# Patient Record
Sex: Male | Born: 1955
Health system: Southern US, Community
[De-identification: ages and names within clinical notes are randomized; demographics above are authoritative.]

## PROBLEM LIST (undated history)

## (undated) DIAGNOSIS — E119 Type 2 diabetes mellitus without complications: Secondary | ICD-10-CM

## (undated) DIAGNOSIS — F1010 Alcohol abuse, uncomplicated: Secondary | ICD-10-CM

## (undated) HISTORY — PX: APPENDECTOMY: SHX54

---

## 2018-02-18 ENCOUNTER — Emergency Department (HOSPITAL_COMMUNITY): Payer: Self-pay

## 2018-02-18 ENCOUNTER — Emergency Department (HOSPITAL_COMMUNITY)
Admission: EM | Admit: 2018-02-18 | Discharge: 2018-02-19 | Disposition: A | Discharge status: Home / Self Care | Payer: Self-pay | Attending: Emergency Medicine | Admitting: Emergency Medicine

## 2018-02-18 ENCOUNTER — Other Ambulatory Visit: Payer: Self-pay

## 2018-02-18 ENCOUNTER — Encounter (HOSPITAL_COMMUNITY): Payer: Self-pay | Admitting: Emergency Medicine

## 2018-02-18 DIAGNOSIS — R55 Syncope and collapse: Secondary | ICD-10-CM | POA: Insufficient documentation

## 2018-02-18 DIAGNOSIS — R739 Hyperglycemia, unspecified: Secondary | ICD-10-CM | POA: Insufficient documentation

## 2018-02-18 LAB — BASIC METABOLIC PANEL
Anion gap: 18 — ABNORMAL HIGH (ref 5–15)
BUN: 10 mg/dL (ref 8–23)
CHLORIDE: 99 mmol/L (ref 98–111)
CO2: 20 mmol/L — ABNORMAL LOW (ref 22–32)
CREATININE: 0.68 mg/dL (ref 0.61–1.24)
Calcium: 9.1 mg/dL (ref 8.9–10.3)
GFR calc non Af Amer: >60 mL/min (ref >60)
Glucose, Bld: 304 mg/dL — ABNORMAL HIGH (ref 70–99)
Potassium: 4 mmol/L (ref 3.5–5.1)
SODIUM: 137 mmol/L (ref 135–145)

## 2018-02-18 LAB — CBC
HCT: 41.4 % (ref 39.0–52.0)
Hemoglobin: 14.4 g/dL (ref 13.0–17.0)
MCH: 34.2 pg — ABNORMAL HIGH (ref 26.0–34.0)
MCHC: 34.8 g/dL (ref 30.0–36.0)
MCV: 98.3 fL (ref 78.0–100.0)
PLATELETS: 159 10*3/uL (ref 150–400)
RBC: 4.21 MIL/uL — AB (ref 4.22–5.81)
RDW: 12 % (ref 11.5–15.5)
WBC: 4.5 10*3/uL (ref 4.0–10.5)

## 2018-02-18 LAB — URINALYSIS, ROUTINE W REFLEX MICROSCOPIC
BILIRUBIN URINE: NEGATIVE
Bacteria, UA: NONE SEEN
Glucose, UA: >=500 mg/dL — AB
Hgb urine dipstick: NEGATIVE
Ketones, ur: 20 mg/dL — AB
LEUKOCYTES UA: NEGATIVE
NITRITE: NEGATIVE
PROTEIN: NEGATIVE mg/dL
SPECIFIC GRAVITY, URINE: 1.015 (ref 1.005–1.030)
pH: 5 (ref 5.0–8.0)

## 2018-02-18 LAB — CBG MONITORING, ED: Glucose-Capillary: 300 mg/dL — ABNORMAL HIGH (ref 70–99)

## 2018-02-18 LAB — I-STAT TROPONIN, ED: Troponin i, poc: 0.01 ng/mL (ref 0.00–0.08)

## 2018-02-18 MED ORDER — SODIUM CHLORIDE 0.9 % IV BOLUS
1000.0000 mL | Freq: Once | INTRAVENOUS | Status: AC
  Administered 2018-02-19: 1000 mL via INTRAVENOUS

## 2018-02-18 NOTE — ED Triage Notes (Signed)
Pt reports multiple syncopal episodes today, one witnessed by family member. ETOH on board. Pt fell and hit is face, abrasion noted to orbital area on L side

## 2018-02-18 NOTE — ED Notes (Signed)
Pt CBG was 300, notified Emilie(RN)

## 2018-02-18 NOTE — ED Notes (Signed)
Patient transported to CT 

## 2018-02-18 NOTE — ED Notes (Signed)
Pt concerned regarding fingernails falling off. MD and family at bedside. Has not seen a doctor in over 30 years.

## 2018-02-18 NOTE — ED Provider Notes (Signed)
[facility] EMERGENCY DEPARTMENT Provider Note   CSN: [id number] Arrival date & time: 02/18/18  2227     History   Chief Complaint Chief Complaint  Patient presents with  . Loss of Consciousness    HPI Frederick Moody is a 62 y.o. male.  Patient is a 62 year old male with no significant past medical history and tells me he has not seen a physician in nearly 40 years.  He presents today for evaluation of syncope.  He states he he has had several episodes this evening where he has "fallen out".  One time he fell and struck his head on the floor.  He does report a brief loss of consciousness and has abrasions to the forehead above his left eye.  He denies any palpitations, chest pain, headache, or visual disturbances.  He does admit to consuming one beer this evening and also took a Xanax that was given to him by his brother.  Patient does admit to experiencing numbness in his feet that has been ongoing for may months, and is now having the same sensation in the right hand.    The history is provided by the patient and a relative.  Loss of Consciousness   This is a new problem. Episode onset: this afternoon. The problem has been resolved. He lost consciousness for a period of 1 to 5 minutes. The problem is associated with normal activity. Pertinent negatives include chest pain, fever, malaise/fatigue, palpitations and visual change.    History reviewed. No pertinent past medical history.  There are no active problems to display for this patient.   History reviewed. No pertinent surgical history.      Home Medications    Prior to Admission medications   Not on File    Family History No family history on file.  Social History Social History   Tobacco Use  . Smoking status: Never Smoker  . Smokeless tobacco: Never Used  Substance Use Topics  . Alcohol use: Yes  . Drug use: Not Currently     Allergies   Patient has no known  allergies.   Review of Systems Review of Systems  Constitutional: Negative for fever and malaise/fatigue.  Cardiovascular: Positive for syncope. Negative for chest pain and palpitations.  All other systems reviewed and are negative.    Physical Exam Updated Vital Signs BP (!) 162/90 (BP Location: Right Arm)   Pulse 92   Temp 98 F (36.7 C) (Oral)   Resp (!) 23   Ht 5\' 9"  (1.753 m)   Wt 123.4 kg   SpO2 96%   BMI 40.17 kg/m   Physical Exam  Constitutional: He is oriented to person, place, and time. He appears well-developed and well-nourished. No distress.  HENT:  Head: Normocephalic.  Mouth/Throat: Oropharynx is clear and moist.  There are 3 areas of superficial abrasion to the forehead just above and lateral to the left eyebrow.  Eyes: Pupils are equal, round, and reactive to light. EOM are normal.  Neck: Normal range of motion. Neck supple.  There is no cervical spine tenderness.  There are no step-offs.  He has painless range of motion in all directions.  Cardiovascular: Normal rate and regular rhythm. Exam reveals no friction rub.  No murmur heard. Pulmonary/Chest: Effort normal and breath sounds normal. No respiratory distress. He has no wheezes. He has no rales.  Abdominal: Soft. Bowel sounds are normal. He exhibits no distension. There is no tenderness.  Musculoskeletal: Normal range of motion. He exhibits  no edema.  Neurological: He is alert and oriented to person, place, and time. No cranial nerve deficit. He exhibits normal muscle tone. Coordination normal.  Skin: Skin is warm and dry. He is not diaphoretic.  Nursing note and vitals reviewed.    ED Treatments / Results  Labs (all labs ordered are listed, but only abnormal results are displayed) Labs Reviewed  URINALYSIS, ROUTINE W REFLEX MICROSCOPIC - Abnormal; Notable for the following components:      Result Value   Color, Urine STRAW (*)    Glucose, UA >=500 (*)    Ketones, ur 20 (*)    All other  components within normal limits  CBG MONITORING, ED - Abnormal; Notable for the following components:   Glucose-Capillary 300 (*)    All other components within normal limits  BASIC METABOLIC PANEL  CBC  ETHANOL  I-STAT TROPONIN, ED    EKG None  Radiology No results found.  Procedures Procedures (including critical care time)  Medications Ordered in ED Medications  sodium chloride 0.9 % bolus 1,000 mL (has no administration in time range)     Initial Impression / Assessment and Plan / ED Course  I have reviewed the triage vital signs and the nursing notes.  Pertinent labs & imaging results that were available during my care of the patient were reviewed by me and considered in my medical decision making (see chart for details).  Patient presents here with recurrent syncope this evening.  He was apparently drinking alcohol in greater quantity than he admits and also reports taking a Xanax along with it.  He had 3 episodes of what sounds like syncope.  His neurologic exam is nonfocal and his work-up is otherwise unremarkable.  Head CT is negative, EKG is nondiagnostic, and laboratory studies are all unremarkable with the exception of an elevated blood sugar of 300.  I suspect he runs a chronically elevated glucose as he has not seen a physician in many years.  I have advised him to follow-up with a primary doctor and he will be prescribed a low-dose of metformin prior to discharge.  Final Clinical Impressions(s) / ED Diagnoses   Final diagnoses:  None    ED Discharge Orders    None       Geoffery Lyonselo, Kalea Perine, MD 02/19/18 [phone]

## 2018-02-19 LAB — ETHANOL: Alcohol, Ethyl (B): 185 mg/dL — ABNORMAL HIGH (ref <10)

## 2018-02-19 LAB — CBG MONITORING, ED: GLUCOSE-CAPILLARY: 262 mg/dL — AB (ref 70–99)

## 2018-02-19 MED ORDER — METFORMIN HCL 500 MG PO TABS: 500.0000 mg | ORAL_TABLET | Freq: Two times a day (BID) | ORAL | 0 refills | Status: DC

## 2018-02-19 NOTE — ED Notes (Signed)
Per Judd Lienelo, MD, pt may eat and drink.

## 2018-02-19 NOTE — ED Notes (Signed)
ED Provider at bedside. 

## 2018-02-19 NOTE — ED Notes (Signed)
Pt given paper scrubs to wear at discharge 

## 2018-02-19 NOTE — ED Notes (Signed)
Pt ambulated to and from hallway bathroom, without difficulty.

## 2018-02-19 NOTE — ED Notes (Signed)
Reviewed d/c instructions with pt, who verbalized understanding and had no outstanding questions. Pt departed in NAD, refused use of wheelchair.   

## 2018-02-19 NOTE — Discharge Instructions (Addendum)
Begin taking metformin as prescribed.  You need to obtain a primary care doctor with whom you can follow-up to discuss your blood sugar.  Return to the ER if symptoms significantly worsen or change.

## 2019-01-07 ENCOUNTER — Other Ambulatory Visit: Payer: Self-pay

## 2019-01-07 ENCOUNTER — Encounter (HOSPITAL_COMMUNITY): Payer: Self-pay

## 2019-01-07 ENCOUNTER — Emergency Department (HOSPITAL_COMMUNITY): Payer: Medicaid Other

## 2019-01-07 ENCOUNTER — Inpatient Hospital Stay (HOSPITAL_COMMUNITY)
Admission: EM | Admit: 2019-01-07 | Discharge: 2019-02-09 | DRG: 853 | Disposition: A | Discharge status: Home Health | Payer: Medicaid Other | Attending: Internal Medicine | Admitting: Internal Medicine

## 2019-01-07 ENCOUNTER — Ambulatory Visit (HOSPITAL_COMMUNITY)
Admission: EM | Admit: 2019-01-07 | Discharge: 2019-01-07 | Disposition: A | Discharge status: Home / Self Care | Payer: Self-pay

## 2019-01-07 DIAGNOSIS — F419 Anxiety disorder, unspecified: Secondary | ICD-10-CM | POA: Diagnosis not present

## 2019-01-07 DIAGNOSIS — R0682 Tachypnea, not elsewhere classified: Secondary | ICD-10-CM

## 2019-01-07 DIAGNOSIS — J690 Pneumonitis due to inhalation of food and vomit: Secondary | ICD-10-CM

## 2019-01-07 DIAGNOSIS — Z79899 Other long term (current) drug therapy: Secondary | ICD-10-CM

## 2019-01-07 DIAGNOSIS — L89159 Pressure ulcer of sacral region, unspecified stage: Secondary | ICD-10-CM | POA: Diagnosis not present

## 2019-01-07 DIAGNOSIS — R6521 Severe sepsis with septic shock: Secondary | ICD-10-CM | POA: Diagnosis present

## 2019-01-07 DIAGNOSIS — E870 Hyperosmolality and hypernatremia: Secondary | ICD-10-CM | POA: Diagnosis not present

## 2019-01-07 DIAGNOSIS — G92 Toxic encephalopathy: Secondary | ICD-10-CM | POA: Diagnosis not present

## 2019-01-07 DIAGNOSIS — E876 Hypokalemia: Secondary | ICD-10-CM

## 2019-01-07 DIAGNOSIS — Z9289 Personal history of other medical treatment: Secondary | ICD-10-CM

## 2019-01-07 DIAGNOSIS — I471 Supraventricular tachycardia: Secondary | ICD-10-CM | POA: Diagnosis not present

## 2019-01-07 DIAGNOSIS — E1110 Type 2 diabetes mellitus with ketoacidosis without coma: Secondary | ICD-10-CM | POA: Diagnosis present

## 2019-01-07 DIAGNOSIS — R0600 Dyspnea, unspecified: Secondary | ICD-10-CM

## 2019-01-07 DIAGNOSIS — Z880 Allergy status to penicillin: Secondary | ICD-10-CM

## 2019-01-07 DIAGNOSIS — G7281 Critical illness myopathy: Secondary | ICD-10-CM | POA: Diagnosis not present

## 2019-01-07 DIAGNOSIS — L0291 Cutaneous abscess, unspecified: Secondary | ICD-10-CM

## 2019-01-07 DIAGNOSIS — Z978 Presence of other specified devices: Secondary | ICD-10-CM

## 2019-01-07 DIAGNOSIS — I4891 Unspecified atrial fibrillation: Secondary | ICD-10-CM | POA: Diagnosis present

## 2019-01-07 DIAGNOSIS — Z833 Family history of diabetes mellitus: Secondary | ICD-10-CM

## 2019-01-07 DIAGNOSIS — Z452 Encounter for adjustment and management of vascular access device: Secondary | ICD-10-CM

## 2019-01-07 DIAGNOSIS — E871 Hypo-osmolality and hyponatremia: Secondary | ICD-10-CM | POA: Diagnosis not present

## 2019-01-07 DIAGNOSIS — I10 Essential (primary) hypertension: Secondary | ICD-10-CM | POA: Diagnosis present

## 2019-01-07 DIAGNOSIS — R509 Fever, unspecified: Secondary | ICD-10-CM

## 2019-01-07 DIAGNOSIS — L02212 Cutaneous abscess of back [any part, except buttock]: Secondary | ICD-10-CM | POA: Diagnosis present

## 2019-01-07 DIAGNOSIS — E440 Moderate protein-calorie malnutrition: Secondary | ICD-10-CM | POA: Insufficient documentation

## 2019-01-07 DIAGNOSIS — Z01818 Encounter for other preprocedural examination: Secondary | ICD-10-CM

## 2019-01-07 DIAGNOSIS — Z794 Long term (current) use of insulin: Secondary | ICD-10-CM

## 2019-01-07 DIAGNOSIS — E1169 Type 2 diabetes mellitus with other specified complication: Secondary | ICD-10-CM

## 2019-01-07 DIAGNOSIS — E869 Volume depletion, unspecified: Secondary | ICD-10-CM | POA: Diagnosis present

## 2019-01-07 DIAGNOSIS — Z515 Encounter for palliative care: Secondary | ICD-10-CM

## 2019-01-07 DIAGNOSIS — M609 Myositis, unspecified: Secondary | ICD-10-CM | POA: Diagnosis not present

## 2019-01-07 DIAGNOSIS — A4101 Sepsis due to Methicillin susceptible Staphylococcus aureus: Secondary | ICD-10-CM | POA: Diagnosis present

## 2019-01-07 DIAGNOSIS — L03114 Cellulitis of left upper limb: Secondary | ICD-10-CM | POA: Diagnosis present

## 2019-01-07 DIAGNOSIS — J810 Acute pulmonary edema: Secondary | ICD-10-CM | POA: Diagnosis not present

## 2019-01-07 DIAGNOSIS — G4700 Insomnia, unspecified: Secondary | ICD-10-CM | POA: Diagnosis present

## 2019-01-07 DIAGNOSIS — L8990 Pressure ulcer of unspecified site, unspecified stage: Secondary | ICD-10-CM | POA: Insufficient documentation

## 2019-01-07 DIAGNOSIS — Z4659 Encounter for fitting and adjustment of other gastrointestinal appliance and device: Secondary | ICD-10-CM

## 2019-01-07 DIAGNOSIS — B9561 Methicillin susceptible Staphylococcus aureus infection as the cause of diseases classified elsewhere: Secondary | ICD-10-CM

## 2019-01-07 DIAGNOSIS — R188 Other ascites: Secondary | ICD-10-CM | POA: Diagnosis present

## 2019-01-07 DIAGNOSIS — F10239 Alcohol dependence with withdrawal, unspecified: Secondary | ICD-10-CM | POA: Diagnosis present

## 2019-01-07 DIAGNOSIS — J9601 Acute respiratory failure with hypoxia: Secondary | ICD-10-CM | POA: Diagnosis not present

## 2019-01-07 DIAGNOSIS — R7881 Bacteremia: Secondary | ICD-10-CM

## 2019-01-07 DIAGNOSIS — N390 Urinary tract infection, site not specified: Secondary | ICD-10-CM | POA: Diagnosis not present

## 2019-01-07 DIAGNOSIS — Z781 Physical restraint status: Secondary | ICD-10-CM

## 2019-01-07 DIAGNOSIS — M4622 Osteomyelitis of vertebra, cervical region: Secondary | ICD-10-CM

## 2019-01-07 DIAGNOSIS — J90 Pleural effusion, not elsewhere classified: Secondary | ICD-10-CM | POA: Diagnosis present

## 2019-01-07 DIAGNOSIS — A419 Sepsis, unspecified organism: Secondary | ICD-10-CM

## 2019-01-07 DIAGNOSIS — R296 Repeated falls: Secondary | ICD-10-CM | POA: Diagnosis present

## 2019-01-07 DIAGNOSIS — Z6828 Body mass index (BMI) 28.0-28.9, adult: Secondary | ICD-10-CM

## 2019-01-07 DIAGNOSIS — I82613 Acute embolism and thrombosis of superficial veins of upper extremity, bilateral: Secondary | ICD-10-CM | POA: Diagnosis present

## 2019-01-07 DIAGNOSIS — Z66 Do not resuscitate: Secondary | ICD-10-CM | POA: Diagnosis present

## 2019-01-07 DIAGNOSIS — G9340 Encephalopathy, unspecified: Secondary | ICD-10-CM

## 2019-01-07 DIAGNOSIS — Z9889 Other specified postprocedural states: Secondary | ICD-10-CM

## 2019-01-07 DIAGNOSIS — E875 Hyperkalemia: Secondary | ICD-10-CM | POA: Diagnosis present

## 2019-01-07 DIAGNOSIS — D6489 Other specified anemias: Secondary | ICD-10-CM | POA: Diagnosis not present

## 2019-01-07 DIAGNOSIS — R4182 Altered mental status, unspecified: Secondary | ICD-10-CM

## 2019-01-07 DIAGNOSIS — E11649 Type 2 diabetes mellitus with hypoglycemia without coma: Secondary | ICD-10-CM | POA: Diagnosis not present

## 2019-01-07 DIAGNOSIS — R600 Localized edema: Secondary | ICD-10-CM

## 2019-01-07 DIAGNOSIS — Z20828 Contact with and (suspected) exposure to other viral communicable diseases: Secondary | ICD-10-CM | POA: Diagnosis present

## 2019-01-07 DIAGNOSIS — M009 Pyogenic arthritis, unspecified: Secondary | ICD-10-CM

## 2019-01-07 DIAGNOSIS — R609 Edema, unspecified: Secondary | ICD-10-CM

## 2019-01-07 DIAGNOSIS — J9690 Respiratory failure, unspecified, unspecified whether with hypoxia or hypercapnia: Secondary | ICD-10-CM

## 2019-01-07 DIAGNOSIS — F10939 Alcohol use, unspecified with withdrawal, unspecified: Secondary | ICD-10-CM

## 2019-01-07 DIAGNOSIS — J9600 Acute respiratory failure, unspecified whether with hypoxia or hypercapnia: Secondary | ICD-10-CM

## 2019-01-07 DIAGNOSIS — J189 Pneumonia, unspecified organism: Secondary | ICD-10-CM

## 2019-01-07 HISTORY — DX: Alcohol abuse, uncomplicated: F10.10

## 2019-01-07 HISTORY — DX: Type 2 diabetes mellitus without complications: E11.9

## 2019-01-07 LAB — URINALYSIS, ROUTINE W REFLEX MICROSCOPIC
Bilirubin Urine: NEGATIVE
Glucose, UA: >=500 mg/dL — AB
Ketones, ur: 20 mg/dL — AB
Leukocytes,Ua: NEGATIVE
Nitrite: NEGATIVE
Protein, ur: NEGATIVE mg/dL
Specific Gravity, Urine: 1.024 (ref 1.005–1.030)
pH: 6 (ref 5.0–8.0)

## 2019-01-07 LAB — BASIC METABOLIC PANEL
Anion gap: 16 — ABNORMAL HIGH (ref 5–15)
BUN: 23 mg/dL (ref 8–23)
CO2: 16 mmol/L — ABNORMAL LOW (ref 22–32)
Calcium: 8.5 mg/dL — ABNORMAL LOW (ref 8.9–10.3)
Chloride: 95 mmol/L — ABNORMAL LOW (ref 98–111)
Creatinine, Ser: 1.05 mg/dL (ref 0.61–1.24)
GFR calc Af Amer: >60 mL/min (ref >60)
GFR calc non Af Amer: >60 mL/min (ref >60)
Glucose, Bld: 456 mg/dL — ABNORMAL HIGH (ref 70–99)
Potassium: 4.4 mmol/L (ref 3.5–5.1)
Sodium: 127 mmol/L — ABNORMAL LOW (ref 135–145)

## 2019-01-07 LAB — CBC WITH DIFFERENTIAL/PLATELET
Abs Immature Granulocytes: 0 10*3/uL (ref 0.00–0.07)
Basophils Absolute: 0 10*3/uL (ref 0.0–0.1)
Basophils Relative: 0 %
Eosinophils Absolute: 0 10*3/uL (ref 0.0–0.5)
Eosinophils Relative: 0 %
HCT: 41 % (ref 39.0–52.0)
Hemoglobin: 14.5 g/dL (ref 13.0–17.0)
Lymphocytes Relative: 0 %
Lymphs Abs: 0 10*3/uL — ABNORMAL LOW (ref 0.7–4.0)
MCH: 33.2 pg (ref 26.0–34.0)
MCHC: 35.4 g/dL (ref 30.0–36.0)
MCV: 93.8 fL (ref 80.0–100.0)
Monocytes Absolute: 1 10*3/uL (ref 0.1–1.0)
Monocytes Relative: 4 %
Neutro Abs: 23.5 10*3/uL — ABNORMAL HIGH (ref 1.7–7.7)
Neutrophils Relative %: 96 %
Platelets: 277 10*3/uL (ref 150–400)
RBC: 4.37 MIL/uL (ref 4.22–5.81)
RDW: 11.5 % (ref 11.5–15.5)
WBC: 24.5 10*3/uL — ABNORMAL HIGH (ref 4.0–10.5)
nRBC: 0 % (ref 0.0–0.2)
nRBC: 0 /100 WBC

## 2019-01-07 LAB — POCT I-STAT EG7
Acid-base deficit: 3 mmol/L — ABNORMAL HIGH (ref 0.0–2.0)
Bicarbonate: 21.4 mmol/L (ref 20.0–28.0)
Calcium, Ion: 1.2 mmol/L (ref 1.15–1.40)
HCT: 44 % (ref 39.0–52.0)
Hemoglobin: 15 g/dL (ref 13.0–17.0)
O2 Saturation: 100 %
Potassium: 5.1 mmol/L (ref 3.5–5.1)
Sodium: 122 mmol/L — ABNORMAL LOW (ref 135–145)
TCO2: 22 mmol/L (ref 22–32)
pCO2, Ven: 34.5 mmHg — ABNORMAL LOW (ref 44.0–60.0)
pH, Ven: 7.401 (ref 7.250–7.430)
pO2, Ven: 171 mmHg — ABNORMAL HIGH (ref 32.0–45.0)

## 2019-01-07 LAB — TROPONIN I (HIGH SENSITIVITY)
Troponin I (High Sensitivity): 23 ng/L — ABNORMAL HIGH (ref <18)
Troponin I (High Sensitivity): 41 ng/L — ABNORMAL HIGH (ref <18)

## 2019-01-07 LAB — COMPREHENSIVE METABOLIC PANEL
ALT: 39 U/L (ref 0–44)
AST: 32 U/L (ref 15–41)
Albumin: 2.5 g/dL — ABNORMAL LOW (ref 3.5–5.0)
Alkaline Phosphatase: 127 U/L — ABNORMAL HIGH (ref 38–126)
Anion gap: 21 — ABNORMAL HIGH (ref 5–15)
BUN: 24 mg/dL — ABNORMAL HIGH (ref 8–23)
CO2: 19 mmol/L — ABNORMAL LOW (ref 22–32)
Calcium: 10 mg/dL (ref 8.9–10.3)
Chloride: 83 mmol/L — ABNORMAL LOW (ref 98–111)
Creatinine, Ser: 1.27 mg/dL — ABNORMAL HIGH (ref 0.61–1.24)
GFR calc Af Amer: >60 mL/min (ref >60)
GFR calc non Af Amer: 60 mL/min — ABNORMAL LOW (ref >60)
Glucose, Bld: 684 mg/dL (ref 70–99)
Potassium: 5.3 mmol/L — ABNORMAL HIGH (ref 3.5–5.1)
Sodium: 123 mmol/L — ABNORMAL LOW (ref 135–145)
Total Bilirubin: 1.3 mg/dL — ABNORMAL HIGH (ref 0.3–1.2)
Total Protein: 7.4 g/dL (ref 6.5–8.1)

## 2019-01-07 LAB — HEMOGLOBIN A1C
Hgb A1c MFr Bld: 11.7 % — ABNORMAL HIGH (ref 4.8–5.6)
Mean Plasma Glucose: 289.09 mg/dL

## 2019-01-07 LAB — GLUCOSE, CAPILLARY
Glucose-Capillary: 362 mg/dL — ABNORMAL HIGH (ref 70–99)
Glucose-Capillary: 311 mg/dL — ABNORMAL HIGH (ref 70–99)

## 2019-01-07 LAB — MAGNESIUM: Magnesium: 1.7 mg/dL (ref 1.7–2.4)

## 2019-01-07 LAB — PHOSPHORUS: Phosphorus: 3.4 mg/dL (ref 2.5–4.6)

## 2019-01-07 LAB — CBG MONITORING, ED
Glucose-Capillary: >600 mg/dL (ref 70–99)
Glucose-Capillary: 449 mg/dL — ABNORMAL HIGH (ref 70–99)

## 2019-01-07 LAB — BETA-HYDROXYBUTYRIC ACID: Beta-Hydroxybutyric Acid: 5.88 mmol/L — ABNORMAL HIGH (ref 0.05–0.27)

## 2019-01-07 LAB — LACTIC ACID, PLASMA
Lactic Acid, Venous: 3.3 mmol/L (ref 0.5–1.9)
Lactic Acid, Venous: 1.5 mmol/L (ref 0.5–1.9)

## 2019-01-07 LAB — SARS CORONAVIRUS 2 BY RT PCR (HOSPITAL ORDER, PERFORMED IN ~~LOC~~ HOSPITAL LAB): SARS Coronavirus 2: NEGATIVE

## 2019-01-07 MED ORDER — ADULT MULTIVITAMIN W/MINERALS CH
1.0000 | ORAL_TABLET | Freq: Every day | ORAL | Status: DC
  Administered 2019-01-07 – 2019-01-21 (×8): 1 via ORAL
  Filled 2019-01-07 (×10): qty 1

## 2019-01-07 MED ORDER — SODIUM CHLORIDE 0.9 % IV SOLN: INTRAVENOUS | Status: DC

## 2019-01-07 MED ORDER — SODIUM CHLORIDE 0.9 % IV BOLUS (SEPSIS)
1000.0000 mL | Freq: Once | INTRAVENOUS | Status: AC
  Administered 2019-01-07: 19:00:00 1000 mL via INTRAVENOUS

## 2019-01-07 MED ORDER — SODIUM CHLORIDE 0.9 % IV SOLN
INTRAVENOUS | Status: DC
  Administered 2019-01-07: 21:00:00 via INTRAVENOUS

## 2019-01-07 MED ORDER — FOLIC ACID 5 MG/ML IJ SOLN
1.0000 mg | Freq: Every day | INTRAMUSCULAR | Status: DC
  Administered 2019-01-07 – 2019-01-11 (×4): 1 mg via INTRAVENOUS
  Filled 2019-01-07 (×7): qty 0.2

## 2019-01-07 MED ORDER — POTASSIUM CHLORIDE IN NACL 20-0.9 MEQ/L-% IV SOLN
INTRAVENOUS | Status: DC
  Administered 2019-01-07: 23:00:00 via INTRAVENOUS
  Filled 2019-01-07: qty 1000

## 2019-01-07 MED ORDER — ENOXAPARIN SODIUM 40 MG/0.4ML ~~LOC~~ SOLN
40.0000 mg | SUBCUTANEOUS | Status: DC
  Administered 2019-01-08 – 2019-01-11 (×4): 40 mg via SUBCUTANEOUS
  Filled 2019-01-07 (×4): qty 0.4

## 2019-01-07 MED ORDER — MAGNESIUM SULFATE 2 GM/50ML IV SOLN
2.0000 g | Freq: Once | INTRAVENOUS | Status: AC
  Administered 2019-01-07: 2 g via INTRAVENOUS
  Filled 2019-01-07: qty 50

## 2019-01-07 MED ORDER — SODIUM CHLORIDE 0.9 % IV SOLN: 2.0000 g | Freq: Two times a day (BID) | INTRAVENOUS | Status: DC

## 2019-01-07 MED ORDER — VANCOMYCIN HCL 10 G IV SOLR
2000.0000 mg | Freq: Once | INTRAVENOUS | Status: AC
  Administered 2019-01-07: 18:00:00 2000 mg via INTRAVENOUS
  Filled 2019-01-07: qty 2000

## 2019-01-07 MED ORDER — DEXTROSE-NACL 5-0.45 % IV SOLN
INTRAVENOUS | Status: DC
  Administered 2019-01-08: 01:00:00 via INTRAVENOUS

## 2019-01-07 MED ORDER — SODIUM CHLORIDE 0.9 % IV BOLUS (SEPSIS)
500.0000 mL | Freq: Once | INTRAVENOUS | Status: AC
  Administered 2019-01-07: 500 mL via INTRAVENOUS

## 2019-01-07 MED ORDER — RAMELTEON 8 MG PO TABS
8.0000 mg | ORAL_TABLET | Freq: Every day | ORAL | Status: DC
  Administered 2019-01-07: 23:00:00 8 mg via ORAL
  Filled 2019-01-07 (×4): qty 1

## 2019-01-07 MED ORDER — INSULIN REGULAR(HUMAN) IN NACL 100-0.9 UT/100ML-% IV SOLN
INTRAVENOUS | Status: DC
  Administered 2019-01-07: 21:00:00 3.9 [IU]/h via INTRAVENOUS
  Administered 2019-01-08: 4.1 [IU]/h via INTRAVENOUS
  Administered 2019-01-09: 9.8 [IU]/h via INTRAVENOUS
  Administered 2019-01-10: 0.5 [IU]/h via INTRAVENOUS
  Administered 2019-01-10: 3.9 [IU]/h via INTRAVENOUS
  Filled 2019-01-07 (×5): qty 100

## 2019-01-07 MED ORDER — THIAMINE HCL 100 MG/ML IJ SOLN
100.0000 mg | Freq: Every day | INTRAMUSCULAR | Status: DC
  Administered 2019-01-07 – 2019-01-11 (×5): 100 mg via INTRAVENOUS
  Filled 2019-01-07 (×5): qty 2

## 2019-01-07 MED ORDER — VANCOMYCIN HCL 10 G IV SOLR
1500.0000 mg | INTRAVENOUS | Status: DC
  Filled 2019-01-07: qty 1500

## 2019-01-07 MED ORDER — ACETAMINOPHEN 325 MG PO TABS
650.0000 mg | ORAL_TABLET | Freq: Once | ORAL | Status: AC
  Administered 2019-01-07: 650 mg via ORAL
  Filled 2019-01-07: qty 2

## 2019-01-07 MED ORDER — HYDROXYZINE HCL 25 MG PO TABS
25.0000 mg | ORAL_TABLET | Freq: Once | ORAL | Status: AC
  Administered 2019-01-07: 21:00:00 25 mg via ORAL
  Filled 2019-01-07: qty 1

## 2019-01-07 MED ORDER — SODIUM CHLORIDE 0.9 % IV SOLN
2.0000 g | Freq: Once | INTRAVENOUS | Status: AC
  Administered 2019-01-07: 18:00:00 2 g via INTRAVENOUS
  Filled 2019-01-07: qty 2

## 2019-01-07 NOTE — ED Triage Notes (Signed)
Pt has been weak and "legs stiff", barely eating, multiple fallsx 1 week. Pt has sores on legs bilat and wound on upper back that is draining serosangious drainage.

## 2019-01-07 NOTE — ED Triage Notes (Signed)
Pt extremely weak, tachycardic, has abscess on back draining, c/o numbness. Pt examined by Dr. Mannie Stabile, sent to the ER with sister.

## 2019-01-07 NOTE — ED Provider Notes (Signed)
[facility] Provider Note   CSN: [id number] Arrival date & time: 01/07/19  1638    History   Chief Complaint Chief Complaint  Patient presents with  . Fatigue    HPI Frederick Moody is a 63 y.o. male with past medical history of alcohol abuse, type 2 diabetes, presenting to the emergency department with 1 week of feeling unwell.  He states he has been feeling unwell since Tuesday without any specific description of his symptoms.  He states yesterday he had some body aches.  He states he has been drinking lots of water with some increased urinary frequency.  He denies cough, fever, abdominal complaints, or contact with coated positive people.  He does note a draining wound to his back it is been there for multiple months he states.  He states he has not been taking his metformin for at least 6 months to 1 year because he states he thought his diabetes got better and he took himself off of the medication.     The history is provided by the patient.    Past Medical History:  Diagnosis Date  . Diabetes mellitus without complication (HCC)   . ETOH abuse     Patient Active Problem List   Diagnosis Date Noted  . DKA (diabetic ketoacidoses) (HCC) 01/07/2019    Past Surgical History:  Procedure Laterality Date  . APPENDECTOMY          Home Medications    Prior to Admission medications   Medication Sig Start Date End Date Taking? Authorizing Provider  naproxen sodium (ALEVE) 220 MG tablet Take 220-440 mg by mouth 2 (two) times daily as needed (for pain).   Yes [provider]  metFORMIN (GLUCOPHAGE) 500 MG tablet Take 1 tablet (500 mg total) by mouth 2 (two) times daily with a meal. Patient not taking: Reported on 01/07/2019 02/19/18   Geoffery Lyonselo, Douglas, MD    Family History No family history on file.  Social History Social History   Tobacco Use  . Smoking status: Never Smoker  . Smokeless tobacco: Never Used  Substance Use  Topics  . Alcohol use: Not Currently  . Drug use: Never     Allergies   Penicillins   Review of Systems Review of Systems  All other systems reviewed and are negative.    Physical Exam Updated Vital Signs BP (!) 158/83   Pulse (!) 114   Temp (!) 101.2 F (38.4 C) (Oral)   Resp (!) 21   Ht 5\' 6"  (1.676 m)   Wt 79.4 kg   SpO2 98%   BMI 28.25 kg/m   Physical Exam Vitals signs and nursing note reviewed.  Constitutional:      General: He is not in acute distress.    Appearance: He is well-developed.  HENT:     Head: Normocephalic and atraumatic.  Eyes:     Conjunctiva/sclera: Conjunctivae normal.  Cardiovascular:     Rate and Rhythm: Regular rhythm. Tachycardia present.  Pulmonary:     Effort: Pulmonary effort is normal. No respiratory distress.     Breath sounds: Normal breath sounds.     Comments: Tachypneic Abdominal:     General: Bowel sounds are normal.     Palpations: Abdomen is soft.     Tenderness: There is no abdominal tenderness. There is no guarding or rebound.  Skin:    General: Skin is warm.     Comments: Multiple scabbed wounds to bilateral lower legs, no surrounding  erythema or drainage.  Nontender. Is a dime sized ulcerated wound to the upper midline back that appears to have some white slough in the center and surrounding induration and erythema about 4 inches in diameter.  There is no fluctuance or purulent drainage noted.  Neurological:     Mental Status: He is alert.  Psychiatric:        Behavior: Behavior normal.      ED Treatments / Results  Labs (all labs ordered are listed, but only abnormal results are displayed) Labs Reviewed  COMPREHENSIVE METABOLIC PANEL - Abnormal; Notable for the following components:      Result Value   Sodium 123 (*)    Potassium 5.3 (*)    Chloride 83 (*)    CO2 19 (*)    Glucose, Bld 684 (*)    BUN 24 (*)    Creatinine, Ser 1.27 (*)    Albumin 2.5 (*)    Alkaline Phosphatase 127 (*)    Total  Bilirubin 1.3 (*)    GFR calc non Af Amer 60 (*)    Anion gap 21 (*)    All other components within normal limits  LACTIC ACID, PLASMA - Abnormal; Notable for the following components:   Lactic Acid, Venous 3.3 (*)    All other components within normal limits  CBC WITH DIFFERENTIAL/PLATELET - Abnormal; Notable for the following components:   WBC 24.5 (*)    Neutro Abs 23.5 (*)    Lymphs Abs 0.0 (*)    All other components within normal limits  URINALYSIS, ROUTINE W REFLEX MICROSCOPIC - Abnormal; Notable for the following components:   Glucose, UA >=500 (*)    Hgb urine dipstick SMALL (*)    Ketones, ur 20 (*)    Bacteria, UA RARE (*)    All other components within normal limits  BASIC METABOLIC PANEL - Abnormal; Notable for the following components:   Sodium 127 (*)    Chloride 95 (*)    CO2 16 (*)    Glucose, Bld 456 (*)    Calcium 8.5 (*)    Anion gap 16 (*)    All other components within normal limits  HEMOGLOBIN A1C - Abnormal; Notable for the following components:   Hgb A1c MFr Bld 11.7 (*)    All other components within normal limits  CBG MONITORING, ED - Abnormal; Notable for the following components:   Glucose-Capillary >600 (*)    All other components within normal limits  POCT I-STAT EG7 - Abnormal; Notable for the following components:   pCO2, Ven 34.5 (*)    pO2, Ven 171.0 (*)    Acid-base deficit 3.0 (*)    Sodium 122 (*)    All other components within normal limits  CBG MONITORING, ED - Abnormal; Notable for the following components:   Glucose-Capillary 449 (*)    All other components within normal limits  TROPONIN I (HIGH SENSITIVITY) - Abnormal; Notable for the following components:   Troponin I (High Sensitivity) 23 (*)    All other components within normal limits  TROPONIN I (HIGH SENSITIVITY) - Abnormal; Notable for the following components:   Troponin I (High Sensitivity) 41 (*)    All other components within normal limits  SARS CORONAVIRUS 2  (HOSPITAL ORDER, PERFORMED IN [facility])  CULTURE, BLOOD (ROUTINE X 2)  CULTURE, BLOOD (ROUTINE X 2)  URINE CULTURE  LACTIC ACID, PLASMA  MAGNESIUM  PHOSPHORUS  BASIC METABOLIC PANEL  BASIC METABOLIC PANEL  BASIC  METABOLIC PANEL  BETA-HYDROXYBUTYRIC ACID  CBC  I-STAT VENOUS BLOOD GAS, ED    EKG None  Radiology Dg Chest Port 1 View  Result Date: 01/07/2019 CLINICAL DATA:  Sepsis EXAM: PORTABLE CHEST 1 VIEW COMPARISON:  02/18/2018 FINDINGS: Heart and mediastinal contours are within normal limits. No focal opacities or effusions. No acute bony abnormality. IMPRESSION: No active disease. Electronically Signed   By: Rolm Baptise M.D.   On: 01/07/2019 17:57    Procedures Procedures (including critical care time)  Medications Ordered in ED Medications  ceFEPIme (MAXIPIME) 2 g in sodium chloride 0.9 % 100 mL IVPB (has no administration in time range)  vancomycin (VANCOCIN) 1,500 mg in sodium chloride 0.9 % 500 mL IVPB (has no administration in time range)  0.9 %  sodium chloride infusion (has no administration in time range)  dextrose 5 %-0.45 % sodium chloride infusion (has no administration in time range)  insulin regular, human (MYXREDLIN) 100 units/ 100 mL infusion (3.9 Units/hr Intravenous New Bag/Given 01/07/19 2054)  enoxaparin (LOVENOX) injection 40 mg (has no administration in time range)  folic acid injection 1 mg (has no administration in time range)  thiamine (B-1) injection 100 mg (has no administration in time range)  multivitamin with minerals tablet 1 tablet (has no administration in time range)  0.9 %  sodium chloride infusion ( Intravenous New Bag/Given 01/07/19 2049)  ramelteon (ROZEREM) tablet 8 mg (has no administration in time range)  vancomycin (VANCOCIN) 2,000 mg in sodium chloride 0.9 % 500 mL IVPB (0 mg Intravenous Stopped 01/07/19 2013)  ceFEPIme (MAXIPIME) 2 g in sodium chloride 0.9 % 100 mL IVPB (0 g Intravenous Stopped 01/07/19 1904)  sodium  chloride 0.9 % bolus 1,000 mL (0 mLs Intravenous Stopped 01/07/19 2037)    And  sodium chloride 0.9 % bolus 1,000 mL (0 mLs Intravenous Stopped 01/07/19 2037)    And  sodium chloride 0.9 % bolus 500 mL (0 mLs Intravenous Stopped 01/07/19 2026)  acetaminophen (TYLENOL) tablet 650 mg (650 mg Oral Given 01/07/19 1903)  hydrOXYzine (ATARAX/VISTARIL) tablet 25 mg (25 mg Oral Given 01/07/19 2049)     Initial Impression / Assessment and Plan / ED Course  I have reviewed the triage vital signs and the nursing notes.  Pertinent labs & imaging results that were available during my care of the patient were reviewed by me and considered in my medical decision making (see chart for details).  Clinical Course as of Jan 06 2130  Wed Jan 07, 2019  1941 Hospitalist requesting they put orders in for glucose stabilizer for technical purposes.  For that reason, orders not placed yet.   [JR]  1942 IM accepting admission.   [JR]    Clinical Course User Index [JR] , [place] N, PA-C   CRITICAL CARE Performed by: [place] N    Total critical care time: 45 minutes  Critical care time was exclusive of separately billable procedures and treating other patients.  Critical care was necessary to treat or prevent imminent or life-threatening deterioration.  Critical care was time spent personally by me on the following activities: development of treatment plan with patient and/or surrogate as well as nursing, discussions with consultants, evaluation of patient's response to treatment, examination of patient, obtaining history from patient or surrogate, ordering and performing treatments and interventions, ordering and review of laboratory studies, ordering and review of radiographic studies, pulse oximetry and re-evaluation of patient's condition. ca    Sepsis - Repeat Assessment  Performed at:  1900  Vitals     Blood pressure (!) 141/93, pulse (!) 131, temperature (!) 101.2 F (38.4 C),  temperature source Oral, resp. rate (!) 22, height 5\' 6"  (1.676 m), weight 79.4 kg, SpO2 98 %.  Heart:     Tachycardic  Lungs:    CTA  Capillary Refill:   <2 sec  Peripheral Pulse:   Radial pulse palpable  Skin:     Normal Color     Patient presenting with fever, tachycardia, and hyperglycemia.  Patient has been feeling unwell for 1 week, however without specific symptoms.  Patient took himself off of his diabetes medication because he states he was feeling well and thought he must have fixed his diabetes.  He has not had treatment for 6 months to 1 year and does not have a PCP.  He has had chronic wound to his upper back that appears to have about 4 inches of surrounding erythema and induration, however does not appear to have any fluctuant abscess.  The wound is open.  Unclear if this is the source of infection?  On arrival, patient is significantly tachycardic, febrile, and hypertensive.  Code sepsis initiated.  Broad-spectrum antibiotics given.  30/kg fluids.  His initial blood glucose today over 600, with a pseudohyponatremia of 123. Also appears to have acidosis with elevated gap and ketonuria, appears to be in DKA as well. Patient is slightly hyperkalemic at 5.3.  His EKG appears to have T wave changes though unsure if this is solely due to the mild hyperkalemia.  Followed this with troponin.    He has a significant leukocytosis of 24.5.  Chest x-ray is negative.  COVID swab is negative.  Patient requires admission for sepsis of unclear etiology, possibly wound on his back, as well as diabetic ketoacidosis.  He is admitted to the internal medicine teaching service for further management.  Patient discussed with Dr. Juleen ChinaKohut, who guided treatment agrees with care plan for admission.  The patient appears reasonably stabilized for admission considering the current resources, flow, and capabilities available in the ED at this time, and I doubt any other [facility] requiring further screening and/or  treatment in the ED prior to admission.   Final Clinical Impressions(s) / ED Diagnoses   Final diagnoses:  Diabetic ketoacidosis without coma associated with type 2 diabetes mellitus (HCC)  Sepsis, due to unspecified organism, unspecified whether acute organ dysfunction present [facility](HCC)    ED Discharge Orders    None       , [place] N, PA-C 01/07/19 2131    Raeford RazorKohut, Stephen, MD 01/09/19 1102

## 2019-01-07 NOTE — H&P (Addendum)
Date: 01/07/2019               Patient Name:  Frederick Moody MRN: [id number]  DOB: 05-17-1956 Age / Sex: 63 y.o., male   PCP: Patient, No Pcp Per         Medical Service: Internal Medicine Teaching Service         Attending Physician: Dr. Virgel Manifold, MD    First Contact: Dr. Amado Coe Pager: [phone]            After Hours (After 5p/  First Contact Pager: [phone]  weekends / holidays): Second Contact Pager: [phone]   Chief Complaint: weakness and falls  History of Present Illness: Frederick Moody is a 63 year old male with a significant PMH of uncontrolled diabetes who presented to the ED today with a history of weakness and falls. He states he was in his usual state of health until 3 days ago when he started to feel unwell and weak and as a result began falling. Pt says this weakness and general fatigue has been worsening ever since. He endorses lightheadedness and dizziness upon standing (which he believes to be the cause of his falls), some mild anorexia, and trouble sleeping. Pt denies any recent illness or sick contacts at home. Additionally, about 1 week ago he noticed a spot on the center of his upper back that broke open after one of his falls and was draining pus. He tried putting antibiotic ointment on it at home, but it continued to get bigger. He denies any associated fevers, chills, or systemic symptoms.  In the ED, he was febrile to 38.4, tachycardic in the 130s, hypertensive 150s/90s, and breathing at a rate of 18 and saturating 99% on room air. Labs revealed Na 123, K 5.3, bicarb 19, glucose 684, Cr 1.27, and anion gap 21. CBC showed a WBC of 24.5 with increased absolute neutrophil count (23.5). Venous blood gas showed pH 7.401, pCO2 34.5, and pO2 171. Initial lactate was 3.3. Troponin was barely elevated to 23. UA has glucose >500, ketones 20, small Hgb, and rare bacteria. He was given a 2.5L NS bolus, 1 dose of cefepime 2g and 1 dose vancomycin 2,000mg , and  started on an insulin drip.   Meds:  No current facility-administered medications on file prior to encounter.    Current Outpatient Medications on File Prior to Encounter  Medication Sig Dispense Refill   naproxen sodium (ALEVE) 220 MG tablet Take 220-440 mg by mouth 2 (two) times daily as needed (for pain).     metFORMIN (GLUCOPHAGE) 500 MG tablet Take 1 tablet (500 mg total) by mouth 2 (two) times daily with a meal. (Patient not taking: Reported on 01/07/2019) 60 tablet 0   Allergies: Allergies as of 01/07/2019 - Review Complete 01/07/2019  Allergen Reaction Noted   Penicillins Swelling 02/18/2018   Past Medical History:  Diagnosis Date   Diabetes mellitus without complication ([place])    ETOH abuse    Family History: Both mother and father had diabetes and heart disease. Pt has a nephew and cousin with bipolar disorder.  Social History: Denies tobacco use. States he drinks 3-4 40oz beers per day, although he has not had a drink since July 4th. Endorses past history of marijuana use, but denies other substance use.  Review of Systems: Review of Systems  Constitutional: Negative for chills and fever.  HENT: Negative for sore throat.   Respiratory: Negative for cough and shortness of breath.   Cardiovascular: Negative  for chest pain.  Gastrointestinal: Negative for abdominal pain, diarrhea, nausea and vomiting.  Genitourinary: Positive for frequency. Negative for dysuria.  Musculoskeletal: Positive for neck pain.  Neurological: Positive for dizziness and weakness. Negative for headaches.  Endo/Heme/Allergies: Positive for polydipsia.   Physical Exam: Blood pressure (!) 163/92, pulse (!) 128, temperature (!) 101.2 F (38.4 C), temperature source Oral, resp. rate 19, height 5\' 6"  (1.676 m), weight 79.4 kg, SpO2 98 %. Physical Exam Vitals signs reviewed.  Constitutional:      General: He is not in acute distress. HENT:     Head: Normocephalic and atraumatic.      Mouth/Throat:     Mouth: Mucous membranes are moist.  Eyes:     Extraocular Movements: Extraocular movements intact.  Cardiovascular:     Rate and Rhythm: Regular rhythm. Tachycardia present.     Pulses:          Dorsalis pedis pulses are 1+ on the right side and 1+ on the left side.     Heart sounds: Normal heart sounds. No murmur. No friction rub. No gallop.   Pulmonary:     Effort: Pulmonary effort is normal.     Breath sounds: No wheezing, rhonchi or rales.  Abdominal:     General: Bowel sounds are normal. There is no distension.     Palpations: Abdomen is soft.     Tenderness: There is no abdominal tenderness.  Musculoskeletal:        General: No swelling.       Arms:  Feet:     Right foot:     Skin integrity: Skin integrity normal.     Left foot:     Skin integrity: Skin integrity normal.  Skin:    General: Skin is warm and dry.     Comments: Multiple 0.5cm abrasions on bilateral lower extremities  Neurological:     General: No focal deficit present.     Mental Status: He is alert and oriented to person, place, and time.  Psychiatric:        Mood and Affect: Mood normal.    EKG: personally reviewed my interpretation is normal rhythm, sinus tachycardia, left ventricular hypertrophy  CXR: personally reviewed my interpretation is no acute process. No cardiomegaly, pneumothorax, pleural effusion, pulmonary edema, or infiltrates.  Assessment & Plan by Problem: Frederick Moody is a 63 year old male with a significant PMH of uncontrolled diabetes who presented to the ED today with ongoing weakness, signs of systemic infection, and elevated blood glucose levels concerning for DKA induced by sepsis.  Active Problems Sepsis Pt's fever, tachycardia, elevated lactate, and generalized symptoms are concerning for systemic infection. Primary source uncertain, but most likely caused by the area of ulceration and drainage in the center of the patient's upper back. Pt received 1 dose  of cefepime 2g and 1 dose vancomycin 2,000mg  in the ED. -lactate resolved with fluids (1.5 from 3.3) -follow-up CBC in AM -continue vancomycin 1500mg  IV -follow-up blood and urine cultures -ordered wound care   DKA On admission, he had elevated blood glucose measures >600, pseudohyponatremia, hyperkalemia, and metabolic gap acidosis concerning for DKA due to systemic infection in the setting of uncontrolled diabetes. Pt states he was diagnosed with diabetes 1 year ago in the ED and was prescribed metformin, but doesn't have a PCP to follow-up with and therefore did not get the prescription renewed. Hemoglobin A1c was 11.7 and beta-hydroxybutyrate was elevated to 5.88. Most recent blood glucose 362, Na 127, K 4.4,  bicarb 16, and gap 16.  -order additional bolus of 1L NS in addition to the 2.5L NS he received in the ED -continue NS + KCl 20mEq @ 13050mL/hr after bolus -on insulin drip per DKA protocol -CBG q1h -trend BMPs q4h -strict Is and Os -continue continuous pulse ox and cardiac monitoring   Alcohol Use Disorder Pt states he was drinking 3-4 40 oz beers per day until he stopped about 10 days ago on July 4th -ordered CIWA protocol without ativan  -folate 1mg , thiamine 100mg , and multivitamin   Insomnia -ordered ramelteon 8mg    Diet - NPO, okay for ice chips and sips with meds Fluids - NS + KCl 20mEq @ 15950mL/hr DVT ppx - enoxaparin 40mg  subQ daily CODE STATUS - FULL CODE  Dispo: Admit patient to Inpatient with expected length of stay greater than 2 midnights.   Signed: Thom ChimesJones, Gerrell Tabet, MD 01/07/2019, 8:03 PM Pager: # [phone]

## 2019-01-07 NOTE — ED Notes (Addendum)
ED TO INPATIENT HANDOFF REPORT  ED Nurse Name and Phone #: Percival Spanishlana [phone]  S Name/Age/Gender Frederick Moody 63 y.o. male Room/Bed: 043C/043C  Code Status   Code Status: Full Code  Home/SNF/Other Home Patient oriented to: self, place, time and situation Is this baseline? Yes   Triage Complete: Triage complete  Chief Complaint diabetic w/ weakness, back wound  Triage Note Pt has been weak and "legs stiff", barely eating, multiple fallsx 1 week. Pt has sores on legs bilat and wound on upper back that is draining serosangious drainage.   Allergies Allergies  Allergen Reactions  . Penicillins Swelling    Level of Care/Admitting Diagnosis ED Disposition    ED Disposition Condition Comment   Admit  [facility] [[id number]]  Level of Care: Progressive [102]  Covid Evaluation: Asymptomatic Screening Protocol (No Symptoms)  Diagnosis: DKA (diabetic ketoacidoses) [facility](HCC) [[id number]]) [[id number]]  Admitting Physician: Erenest BlankBUTCHER, ELIZABETH A [[id number]]  Attending Physician: Burns SpainBUTCHER, ELIZABETH A [phone][[id number]]  Estimated length of stay: past midnight tomorrow  Certification:: I certify this patient will need inpatient services for at least 2 midnights  PT Class (Do Not Modify): Inpatient [101]  PT Acc Code (Do Not Modify): Private [1]       B Medical/Surgery History Past Medical History:  Diagnosis Date  . Diabetes mellitus without complication (HCC)   . ETOH abuse    Past Surgical History:  Procedure Laterality Date  . APPENDECTOMY       A IV Location/Drains/Wounds Patient Lines/Drains/Airways Status   Active Line/Drains/Airways    Name:   Placement date:   Placement time:   Site:   Days:   Peripheral IV 01/07/19 Right Forearm   01/07/19    1702    Forearm   less than 1   Peripheral IV 01/07/19 Left Forearm   01/07/19    1804    Forearm   less than 1          Intake/Output Last 24 hours  Intake/Output Summary (Last 24 hours) at 01/07/2019 2057 Last data  filed at 01/07/2019 1904 Gross per 24 hour  Intake 100 ml  Output -  Net 100 ml    Labs/Imaging Results for orders placed or performed during the hospital encounter of 01/07/19 (from the past 48 hour(s))  Comprehensive metabolic panel     Status: Abnormal   Collection Time: 01/07/19  5:02 PM  Result Value Ref Range   Sodium 123 (L) 135 - 145 mmol/L   Potassium 5.3 (H) 3.5 - 5.1 mmol/L   Chloride 83 (L) 98 - 111 mmol/L   CO2 19 (L) 22 - 32 mmol/L   Glucose, Bld 684 (HH) 70 - 99 mg/dL    Comment: CRITICAL RESULT CALLED TO, READ BACK BY AND VERIFIED WITH: Roselyn MeierM Anye Brose,RN 1802 01/07/2019 WBOND    BUN 24 (H) 8 - 23 mg/dL   Creatinine, Ser 4.781.27 (H) 0.61 - 1.24 mg/dL   Calcium 29.510.0 8.9 - 62.110.3 mg/dL   Total Protein 7.4 6.5 - 8.1 g/dL   Albumin 2.5 (L) 3.5 - 5.0 g/dL   AST 32 15 - 41 U/L   ALT 39 0 - 44 U/L   Alkaline Phosphatase 127 (H) 38 - 126 U/L   Total Bilirubin 1.3 (H) 0.3 - 1.2 mg/dL   GFR calc non Af Amer 60 (L) >60 mL/min   GFR calc Af Amer >60 >60 mL/min   Anion gap 21 (H) 5 - 15    Comment: Performed at [facility], [address] [place], [place] [address]  Lactic acid, plasma     Status: Abnormal   Collection Time: 01/07/19  5:02 PM  Result Value Ref Range   Lactic Acid, Venous 3.3 (HH) 0.5 - 1.9 mmol/L    Comment: CRITICAL RESULT CALLED TO, READ BACK BY AND VERIFIED WITH: Roselyn MeierM Rockney Grenz,RN [id number] 01/07/2019 WBOND Performed at [facility], [address] [place], [place] [address]   CBC with Differential     Status: Abnormal   Collection Time: 01/07/19  5:02 PM  Result Value Ref Range   WBC 24.5 (H) 4.0 - 10.5 K/uL   RBC 4.37 4.22 - 5.81 MIL/uL   Hemoglobin 14.5 13.0 - 17.0 g/dL   HCT 95.641.0 21.339.0 - 08.652.0 %   MCV 93.8 80.0 - 100.0 fL   MCH 33.2 26.0 - 34.0 pg   MCHC 35.4 30.0 - 36.0 g/dL   RDW 57.811.5 46.911.5 - 62.915.5 %   Platelets 277 150 - 400 K/uL   nRBC 0.0 0.0 - 0.2 %   Neutrophils Relative % 96 %   Neutro Abs 23.5 (H) 1.7 - 7.7 K/uL   Lymphocytes Relative 0 %    Lymphs Abs 0.0 (L) 0.7 - 4.0 K/uL   Monocytes Relative 4 %   Monocytes Absolute 1.0 0.1 - 1.0 K/uL   Eosinophils Relative 0 %   Eosinophils Absolute 0.0 0.0 - 0.5 K/uL   Basophils Relative 0 %   Basophils Absolute 0.0 0.0 - 0.1 K/uL   WBC Morphology See Note     Comment: Toxic Granulation   nRBC 0 0 /100 WBC   Abs Immature Granulocytes 0.00 0.00 - 0.07 K/uL   Polychromasia PRESENT     Comment: Performed at [facility], [address] [place], [place] [address]  POC CBG, ED     Status: Abnormal   Collection Time: 01/07/19  5:04 PM  Result Value Ref Range   Glucose-Capillary >600 (HH) 70 - 99 mg/dL  SARS Coronavirus 2 (CEPHEID - Performed in [facility] hospital lab), Hosp Order     Status: None   Collection Time: 01/07/19  5:35 PM   Specimen: Nasopharyngeal Swab  Result Value Ref Range   SARS Coronavirus 2 NEGATIVE NEGATIVE    Comment: (NOTE) If result is NEGATIVE SARS-CoV-2 target nucleic acids are NOT DETECTED. The SARS-CoV-2 RNA is generally detectable in upper and lower  respiratory specimens during the acute phase of infection. The lowest  concentration of SARS-CoV-2 viral copies this assay can detect is 250  copies / mL. A negative result does not preclude SARS-CoV-2 infection  and should not be used as the sole basis for treatment or other  patient management decisions.  A negative result may occur with  improper specimen collection / handling, submission of specimen other  than nasopharyngeal swab, presence of viral mutation(s) within the  areas targeted by this assay, and inadequate number of viral copies  (<250 copies / mL). A negative result must be combined with clinical  observations, patient history, and epidemiological information. If result is POSITIVE SARS-CoV-2 target nucleic acids are DETECTED. The SARS-CoV-2 RNA is generally detectable in upper and lower  respiratory specimens dur ing the acute phase of infection.  Positive  results are  indicative of active infection with SARS-CoV-2.  Clinical  correlation with patient history and other diagnostic information is  necessary to determine patient infection status.  Positive results do  not rule out bacterial infection or co-infection with other  viruses. If result is PRESUMPTIVE POSTIVE SARS-CoV-2 nucleic acids MAY BE PRESENT.   A presumptive positive result was obtained on the submitted specimen  and confirmed on repeat testing.  While 2019 novel coronavirus  (SARS-CoV-2) nucleic acids may be present in the submitted sample  additional confirmatory testing may be necessary for epidemiological  and / or clinical management purposes  to differentiate between  SARS-CoV-2 and other Sarbecovirus currently known to infect humans.  If clinically indicated additional testing with an alternate test  methodology [phone]) is advised. The SARS-CoV-2 RNA is generally  detectable in upper and lower respiratory sp ecimens during the acute  phase of infection. The expected result is Negative. Fact Sheet for Patients:  StrictlyIdeas.no Fact Sheet for Healthcare Providers: BankingDealers.co.za This test is not yet approved or cleared by the [place] FDA and has been authorized for detection and/or diagnosis of SARS-CoV-2 by FDA under an Emergency Use Authorization (EUA).  This EUA will remain in effect (meaning this test can be used) for the duration of the COVID-19 declaration under Section 564(b)(1) of the Act, 21 U.S.C. section 360bbb-3(b)(1), unless the authorization is terminated or revoked sooner. Performed at [facility], [place] [address], [place], [place] [address]   POCT I-Stat EG7     Status: Abnormal   Collection Time: 01/07/19  6:07 PM  Result Value Ref Range   pH, Ven 7.401 7.250 - 7.430   pCO2, Ven 34.5 (L) 44.0 - 60.0 mmHg   pO2, Ven 171.0 (H) 32.0 - 45.0 mmHg   Bicarbonate 21.4 20.0 - 28.0 mmol/L   TCO2 22 22  - 32 mmol/L   O2 Saturation 100.0 %   Acid-base deficit 3.0 (H) 0.0 - 2.0 mmol/L   Sodium 122 (L) 135 - 145 mmol/L   Potassium 5.1 3.5 - 5.1 mmol/L   Calcium, Ion 1.20 1.15 - 1.40 mmol/L   HCT 44.0 39.0 - 52.0 %   Hemoglobin 15.0 13.0 - 17.0 g/dL   Patient temperature HIDE    Sample type VENOUS   Troponin I (High Sensitivity)     Status: Abnormal   Collection Time: 01/07/19  6:08 PM  Result Value Ref Range   Troponin I (High Sensitivity) 23 (H) <18 ng/L    Comment: (NOTE) Elevated high sensitivity troponin I (hsTnI) values and significant  changes across serial measurements may suggest ACS but many other  chronic and acute conditions are known to elevate hsTnI results.  Refer to the "Links" section for chest pain algorithms and additional  guidance. Performed at [facility], [place] [address], [place], [place] [address]   Urinalysis, Routine w reflex microscopic     Status: Abnormal   Collection Time: 01/07/19  6:23 PM  Result Value Ref Range   Color, Urine YELLOW YELLOW   APPearance CLEAR CLEAR   Specific Gravity, Urine 1.024 1.005 - 1.030   pH 6.0 5.0 - 8.0   Glucose, UA >=500 (A) NEGATIVE mg/dL   Hgb urine dipstick SMALL (A) NEGATIVE   Bilirubin Urine NEGATIVE NEGATIVE   Ketones, ur 20 (A) NEGATIVE mg/dL   Protein, ur NEGATIVE NEGATIVE mg/dL   Nitrite NEGATIVE NEGATIVE   Leukocytes,Ua NEGATIVE NEGATIVE   RBC / HPF 0-5 0 - 5 RBC/hpf   WBC, UA 0-5 0 - 5 WBC/hpf   Bacteria, UA RARE (A) NONE SEEN   Squamous Epithelial / LPF 0-5 0 - 5   Mucus PRESENT     Comment: Performed at [facility], [address] [place], [place] [address]  Lactic acid, plasma     Status: None   Collection Time: 01/07/19  7:02 PM  Result Value Ref Range   Lactic Acid, Venous 1.5 0.5 - 1.9 mmol/L    Comment: Performed at [facility], [address], [place], [place] [address]  Hemoglobin A1c     Status: Abnormal   Collection Time: 01/07/19  8:28 PM  Result Value Ref Range    Hgb A1c MFr Bld 11.7 (H) 4.8 - 5.6 %    Comment: (NOTE) Pre diabetes:          5.7%-6.4% Diabetes:              >6.4% Glycemic control for   <7.0% adults with diabetes    Mean Plasma Glucose 289.09 mg/dL    Comment: Performed at [facility], [address], [place], [place] [address]  CBG monitoring, ED     Status: Abnormal   Collection Time: 01/07/19  8:48 PM  Result Value Ref Range   Glucose-Capillary 449 (H) 70 - 99 mg/dL   Dg Chest Port 1 View  Result Date: 01/07/2019 CLINICAL DATA:  Sepsis EXAM: PORTABLE CHEST 1 VIEW COMPARISON:  02/18/2018 FINDINGS: Heart and mediastinal contours are within normal limits. No focal opacities or effusions. No acute bony abnormality. IMPRESSION: No active disease. Electronically Signed   By: Charlett Nose M.D.   On: 01/07/2019 17:57    Pending Labs Unresulted Labs (From admission, onward)    Start     Ordered   01/08/19 0500  CBC  Tomorrow morning,   R     01/07/19 2041   01/07/19 2011  Basic metabolic panel  STAT Now then every 4 hours ,   STAT     01/07/19 2011   01/07/19 2011  Magnesium  Once,   STAT     01/07/19 2011   01/07/19 2011  Beta-hydroxybutyric acid  Once,   STAT     01/07/19 2011   01/07/19 2011  Phosphorus  Once,   STAT     01/07/19 2011   01/07/19 1704  Urine culture  ONCE - STAT,   STAT     01/07/19 1703   01/07/19 1702  Culture, blood (Routine x 2)  BLOOD CULTURE X 2,   STAT     01/07/19 1701   Unscheduled  HIV antibody (Routine Testing)  With next blood draw,   R     01/07/19 2041          Vitals/Pain Today's Vitals   01/07/19 1915 01/07/19 1945 01/07/19 2000 01/07/19 2030  BP: (!) 163/92 (!) 148/82 (!) 157/90 139/78  Pulse: (!) 128 (!) 125 (!) 124 (!) 120  Resp: 19 (!) 22 (!) 22 (!) 29  Temp:      TempSrc:      SpO2: 98% 96% 96% 98%  Weight:      Height:      PainSc:        Isolation Precautions No active isolations  Medications Medications  ceFEPIme (MAXIPIME) 2 g in sodium chloride 0.9 % 100  mL IVPB (has no administration in time range)  vancomycin (VANCOCIN) 1,500 mg in sodium chloride 0.9 % 500 mL IVPB (has no administration in time range)  0.9 %  sodium chloride infusion (has no administration in time range)  dextrose 5 %-0.45 % sodium chloride infusion (has no administration in time range)  insulin regular, human (MYXREDLIN) 100 units/ 100 mL  infusion (3.9 Units/hr Intravenous New Bag/Given 01/07/19 2054)  enoxaparin (LOVENOX) injection 40 mg (has no administration in time range)  folic acid injection 1 mg (has no administration in time range)  thiamine (B-1) injection 100 mg (has no administration in time range)  multivitamin with minerals tablet 1 tablet (has no administration in time range)  0.9 %  sodium chloride infusion ( Intravenous New Bag/Given 01/07/19 2049)  vancomycin (VANCOCIN) 2,000 mg in sodium chloride 0.9 % 500 mL IVPB (0 mg Intravenous Stopped 01/07/19 2013)  ceFEPIme (MAXIPIME) 2 g in sodium chloride 0.9 % 100 mL IVPB (0 g Intravenous Stopped 01/07/19 1904)  sodium chloride 0.9 % bolus 1,000 mL (0 mLs Intravenous Stopped 01/07/19 2037)    And  sodium chloride 0.9 % bolus 1,000 mL (0 mLs Intravenous Stopped 01/07/19 2037)    And  sodium chloride 0.9 % bolus 500 mL (0 mLs Intravenous Stopped 01/07/19 2026)  acetaminophen (TYLENOL) tablet 650 mg (650 mg Oral Given 01/07/19 1903)  hydrOXYzine (ATARAX/VISTARIL) tablet 25 mg (25 mg Oral Given 01/07/19 2049)    Mobility non-ambulatory Moderate fall risk   Focused Assessments    R Recommendations: See Admitting Provider Note  Report given to: Liborio NixonJanice, RN  Additional Notes:

## 2019-01-07 NOTE — Progress Notes (Signed)
Pharmacy Antibiotic Note  Frederick Moody is a 63 y.o. male admitted on 01/07/2019 with sepsis.  Pharmacy has been consulted for cefepime and vancomycin dosing. Pt has "swelling" PCN allergy listed, provider discussed w/ patient and determined it was not an anaphylactic reaction. ED PA Quentin Cornwall confirms okay to give cefepime. Tmax 101.2, WBC 24.5.  Plan: Cefepime 2g IV x1 then cefepime 2g IV q12h Vancomycin 2g IV x1 then vancomycin 1.5g IV q24h Monitor renal function and clinical progression F/u C&S  Goal AUC 400-550. Expected AUC: 535.6 SCr used: 1.27  Height: 5\' 6"  (167.6 cm) Weight: 175 lb (79.4 kg) IBW/kg (Calculated) : 63.8  Temp (24hrs), Avg:101.2 F (38.4 C), Min:101.2 F (38.4 C), Max:101.2 F (38.4 C)  Recent Labs  Lab 01/07/19 1702  WBC PENDING    CrCl cannot be calculated (Patient's most recent lab result is older than the maximum 21 days allowed.).    Allergies  Allergen Reactions  . Penicillins Swelling    Antimicrobials this admission: Vancomycin 7/15 >>  Cefepime 7/15 >>   Dose adjustments this admission: n/a  Microbiology results: Pending  Thank you for allowing pharmacy to be a part of this patient's care.  Natale Lay 01/07/2019 5:42 PM

## 2019-01-07 NOTE — ED Notes (Signed)
Notified PA [place] of pt's critical glucose and lactic acid results

## 2019-01-08 ENCOUNTER — Inpatient Hospital Stay (HOSPITAL_COMMUNITY): Payer: Medicaid Other

## 2019-01-08 ENCOUNTER — Encounter (HOSPITAL_COMMUNITY): Payer: Self-pay

## 2019-01-08 DIAGNOSIS — F10939 Alcohol use, unspecified with withdrawal, unspecified: Secondary | ICD-10-CM

## 2019-01-08 DIAGNOSIS — Z7289 Other problems related to lifestyle: Secondary | ICD-10-CM

## 2019-01-08 DIAGNOSIS — Z880 Allergy status to penicillin: Secondary | ICD-10-CM

## 2019-01-08 DIAGNOSIS — R7881 Bacteremia: Secondary | ICD-10-CM

## 2019-01-08 DIAGNOSIS — B9561 Methicillin susceptible Staphylococcus aureus infection as the cause of diseases classified elsewhere: Secondary | ICD-10-CM

## 2019-01-08 DIAGNOSIS — F10239 Alcohol dependence with withdrawal, unspecified: Secondary | ICD-10-CM

## 2019-01-08 DIAGNOSIS — E118 Type 2 diabetes mellitus with unspecified complications: Secondary | ICD-10-CM

## 2019-01-08 LAB — BASIC METABOLIC PANEL
Anion gap: 11 (ref 5–15)
Anion gap: 11 (ref 5–15)
Anion gap: 13 (ref 5–15)
Anion gap: 14 (ref 5–15)
Anion gap: 14 (ref 5–15)
BUN: 11 mg/dL (ref 8–23)
BUN: 13 mg/dL (ref 8–23)
BUN: 15 mg/dL (ref 8–23)
BUN: 21 mg/dL (ref 8–23)
BUN: 21 mg/dL (ref 8–23)
CO2: 19 mmol/L — ABNORMAL LOW (ref 22–32)
CO2: 19 mmol/L — ABNORMAL LOW (ref 22–32)
CO2: 20 mmol/L — ABNORMAL LOW (ref 22–32)
CO2: 20 mmol/L — ABNORMAL LOW (ref 22–32)
CO2: 20 mmol/L — ABNORMAL LOW (ref 22–32)
Calcium: 8.2 mg/dL — ABNORMAL LOW (ref 8.9–10.3)
Calcium: 8.2 mg/dL — ABNORMAL LOW (ref 8.9–10.3)
Calcium: 8.4 mg/dL — ABNORMAL LOW (ref 8.9–10.3)
Calcium: 8.6 mg/dL — ABNORMAL LOW (ref 8.9–10.3)
Calcium: 8.7 mg/dL — ABNORMAL LOW (ref 8.9–10.3)
Chloride: 100 mmol/L (ref 98–111)
Chloride: 100 mmol/L (ref 98–111)
Chloride: 98 mmol/L (ref 98–111)
Chloride: 99 mmol/L (ref 98–111)
Chloride: 99 mmol/L (ref 98–111)
Creatinine, Ser: 0.59 mg/dL — ABNORMAL LOW (ref 0.61–1.24)
Creatinine, Ser: 0.63 mg/dL (ref 0.61–1.24)
Creatinine, Ser: 0.67 mg/dL (ref 0.61–1.24)
Creatinine, Ser: 0.71 mg/dL (ref 0.61–1.24)
Creatinine, Ser: 0.89 mg/dL (ref 0.61–1.24)
GFR calc Af Amer: >60 mL/min (ref >60)
GFR calc Af Amer: >60 mL/min (ref >60)
GFR calc Af Amer: >60 mL/min (ref >60)
GFR calc Af Amer: >60 mL/min (ref >60)
GFR calc Af Amer: >60 mL/min (ref >60)
GFR calc non Af Amer: >60 mL/min (ref >60)
GFR calc non Af Amer: >60 mL/min (ref >60)
GFR calc non Af Amer: >60 mL/min (ref >60)
GFR calc non Af Amer: >60 mL/min (ref >60)
GFR calc non Af Amer: >60 mL/min (ref >60)
Glucose, Bld: 130 mg/dL — ABNORMAL HIGH (ref 70–99)
Glucose, Bld: 148 mg/dL — ABNORMAL HIGH (ref 70–99)
Glucose, Bld: 215 mg/dL — ABNORMAL HIGH (ref 70–99)
Glucose, Bld: 245 mg/dL — ABNORMAL HIGH (ref 70–99)
Glucose, Bld: 252 mg/dL — ABNORMAL HIGH (ref 70–99)
Potassium: 3.4 mmol/L — ABNORMAL LOW (ref 3.5–5.1)
Potassium: 3.6 mmol/L (ref 3.5–5.1)
Potassium: 3.7 mmol/L (ref 3.5–5.1)
Potassium: 3.8 mmol/L (ref 3.5–5.1)
Potassium: 4.1 mmol/L (ref 3.5–5.1)
Sodium: 131 mmol/L — ABNORMAL LOW (ref 135–145)
Sodium: 131 mmol/L — ABNORMAL LOW (ref 135–145)
Sodium: 131 mmol/L — ABNORMAL LOW (ref 135–145)
Sodium: 132 mmol/L — ABNORMAL LOW (ref 135–145)
Sodium: 132 mmol/L — ABNORMAL LOW (ref 135–145)

## 2019-01-08 LAB — BLOOD CULTURE ID PANEL (REFLEXED)

## 2019-01-08 LAB — CBC
HCT: 34.1 % — ABNORMAL LOW (ref 39.0–52.0)
Hemoglobin: 12.1 g/dL — ABNORMAL LOW (ref 13.0–17.0)
MCH: 33.1 pg (ref 26.0–34.0)
MCHC: 35.5 g/dL (ref 30.0–36.0)
MCV: 93.2 fL (ref 80.0–100.0)
Platelets: 207 10*3/uL (ref 150–400)
RBC: 3.66 MIL/uL — ABNORMAL LOW (ref 4.22–5.81)
RDW: 11.6 % (ref 11.5–15.5)
WBC: 18.9 10*3/uL — ABNORMAL HIGH (ref 4.0–10.5)
nRBC: 0 % (ref 0.0–0.2)

## 2019-01-08 LAB — GLUCOSE, CAPILLARY
Glucose-Capillary: 116 mg/dL — ABNORMAL HIGH (ref 70–99)
Glucose-Capillary: 129 mg/dL — ABNORMAL HIGH (ref 70–99)
Glucose-Capillary: 130 mg/dL — ABNORMAL HIGH (ref 70–99)
Glucose-Capillary: 136 mg/dL — ABNORMAL HIGH (ref 70–99)
Glucose-Capillary: 142 mg/dL — ABNORMAL HIGH (ref 70–99)
Glucose-Capillary: 145 mg/dL — ABNORMAL HIGH (ref 70–99)
Glucose-Capillary: 147 mg/dL — ABNORMAL HIGH (ref 70–99)
Glucose-Capillary: 158 mg/dL — ABNORMAL HIGH (ref 70–99)
Glucose-Capillary: 161 mg/dL — ABNORMAL HIGH (ref 70–99)
Glucose-Capillary: 163 mg/dL — ABNORMAL HIGH (ref 70–99)
Glucose-Capillary: 166 mg/dL — ABNORMAL HIGH (ref 70–99)
Glucose-Capillary: 172 mg/dL — ABNORMAL HIGH (ref 70–99)
Glucose-Capillary: 176 mg/dL — ABNORMAL HIGH (ref 70–99)
Glucose-Capillary: 178 mg/dL — ABNORMAL HIGH (ref 70–99)
Glucose-Capillary: 195 mg/dL — ABNORMAL HIGH (ref 70–99)
Glucose-Capillary: 201 mg/dL — ABNORMAL HIGH (ref 70–99)
Glucose-Capillary: 203 mg/dL — ABNORMAL HIGH (ref 70–99)
Glucose-Capillary: 209 mg/dL — ABNORMAL HIGH (ref 70–99)
Glucose-Capillary: 232 mg/dL — ABNORMAL HIGH (ref 70–99)
Glucose-Capillary: 240 mg/dL — ABNORMAL HIGH (ref 70–99)

## 2019-01-08 LAB — BLOOD GAS, ARTERIAL
Acid-base deficit: 1.7 mmol/L (ref 0.0–2.0)
Bicarbonate: 21.5 mmol/L (ref 20.0–28.0)
Drawn by: 53547
FIO2: 0.21
O2 Saturation: 90.4 %
Patient temperature: 98.6
pCO2 arterial: 30.1 mmHg — ABNORMAL LOW (ref 32.0–48.0)
pH, Arterial: 7.468 — ABNORMAL HIGH (ref 7.350–7.450)
pO2, Arterial: 57.8 mmHg — ABNORMAL LOW (ref 83.0–108.0)

## 2019-01-08 LAB — CK: Total CK: 120 U/L (ref 49–397)

## 2019-01-08 LAB — PHOSPHORUS: Phosphorus: 1.7 mg/dL — ABNORMAL LOW (ref 2.5–4.6)

## 2019-01-08 LAB — MAGNESIUM: Magnesium: 1.6 mg/dL — ABNORMAL LOW (ref 1.7–2.4)

## 2019-01-08 LAB — TROPONIN I (HIGH SENSITIVITY): Troponin I (High Sensitivity): 31 ng/L — ABNORMAL HIGH (ref <18)

## 2019-01-08 MED ORDER — SODIUM CHLORIDE 0.9 % IV BOLUS
1000.0000 mL | Freq: Once | INTRAVENOUS | Status: AC
  Administered 2019-01-08: 1000 mL via INTRAVENOUS

## 2019-01-08 MED ORDER — HYDROXYZINE HCL 25 MG PO TABS
25.0000 mg | ORAL_TABLET | Freq: Once | ORAL | Status: AC
  Administered 2019-01-08: 25 mg via ORAL
  Filled 2019-01-08: qty 1

## 2019-01-08 MED ORDER — CEFAZOLIN SODIUM-DEXTROSE 2-4 GM/100ML-% IV SOLN
2.0000 g | Freq: Three times a day (TID) | INTRAVENOUS | Status: DC
  Administered 2019-01-08 – 2019-01-10 (×5): 2 g via INTRAVENOUS
  Filled 2019-01-08 (×6): qty 100

## 2019-01-08 MED ORDER — SODIUM CHLORIDE 0.9 % IV SOLN
2.0000 g | Freq: Every day | INTRAVENOUS | Status: DC
  Administered 2019-01-08: 2 g via INTRAVENOUS
  Filled 2019-01-08: qty 20

## 2019-01-08 MED ORDER — POTASSIUM CHLORIDE 10 MEQ/100ML IV SOLN
10.0000 meq | INTRAVENOUS | Status: AC
  Administered 2019-01-08 (×3): 10 meq via INTRAVENOUS
  Filled 2019-01-08 (×3): qty 100

## 2019-01-08 MED ORDER — MAGNESIUM SULFATE 2 GM/50ML IV SOLN
2.0000 g | Freq: Once | INTRAVENOUS | Status: AC
  Administered 2019-01-08: 2 g via INTRAVENOUS
  Filled 2019-01-08: qty 50

## 2019-01-08 MED ORDER — KCL IN DEXTROSE-NACL 30-5-0.45 MEQ/L-%-% IV SOLN
INTRAVENOUS | Status: DC
  Administered 2019-01-08: 04:00:00 via INTRAVENOUS
  Administered 2019-01-08: 150 mL/h via INTRAVENOUS
  Administered 2019-01-08 – 2019-01-09 (×2): via INTRAVENOUS
  Filled 2019-01-08 (×4): qty 1000

## 2019-01-08 MED ORDER — POTASSIUM CHLORIDE 10 MEQ/100ML IV SOLN
10.0000 meq | INTRAVENOUS | Status: AC
  Administered 2019-01-08 (×3): 10 meq via INTRAVENOUS
  Filled 2019-01-08 (×3): qty 100

## 2019-01-08 MED ORDER — LORAZEPAM 2 MG/ML IJ SOLN
2.0000 mg | INTRAMUSCULAR | Status: DC | PRN
  Administered 2019-01-08 – 2019-01-11 (×21): 2 mg via INTRAVENOUS
  Filled 2019-01-08 (×21): qty 1

## 2019-01-08 MED ORDER — POTASSIUM PHOSPHATES 15 MMOLE/5ML IV SOLN
20.0000 mmol | Freq: Once | INTRAVENOUS | Status: AC
  Administered 2019-01-08: 23:00:00 20 mmol via INTRAVENOUS
  Filled 2019-01-08: qty 6.67

## 2019-01-08 MED ORDER — LIVING WELL WITH DIABETES BOOK
Freq: Once | Status: AC
  Administered 2019-01-08: 11:00:00
  Filled 2019-01-08: qty 1

## 2019-01-08 MED ORDER — ACETAMINOPHEN 60 MG HALF SUPP
60.0000 mg | Freq: Four times a day (QID) | RECTAL | Status: DC | PRN
  Administered 2019-01-08: 18:00:00 60 mg via RECTAL
  Filled 2019-01-08 (×2): qty 1

## 2019-01-08 NOTE — Consult Note (Signed)
[facility]     Reason for Consult: MSSA Bacteremia    Referring Physician: Dr. Blanch MediaElizabeth Butcher  Principal Problem:   MSSA bacteremia Active Problems:   DKA (diabetic ketoacidoses) (HCC)   Alcohol withdrawal (HCC)   . enoxaparin (LOVENOX) injection  40 mg Subcutaneous Q24H  . folic acid  1 mg Intravenous Daily  . multivitamin with minerals  1 tablet Oral Daily  . ramelteon  8 mg Oral QHS  . thiamine injection  100 mg Intravenous Daily    Recommendations: * Agree with plan to switch patient to cefazolin. Will need 14 days of IV abx therapy following negative cultures. 6 week course will be required if vegetations evident on echo * Recommend TTE to evaluate for endocarditis, f/u with TEE if negative  Assessment: [name] is a 63 year old male with PMH of uncontrolled DM who presented with weakness, fever, tachycardia, elevated lactic acid consistent with sepsis and was found to have MSSA bacteremia. Wound on back may represent possible source or may be 2/2 bacteremia.   Current Antibiotic Usage: * Started on vanc/cefepime in ER following blood cultures * Patient scheduled to be transitioned to Cefazolin this evening * No outpatient antibiotic usage evident per chart review  Patient altered in withdrawal s/p ativan, history obtained per chart review.  HPI: Nichola SizerRobert Licea is a 63 y.o. male with PMH of uncontrolled DM who presented to ED with weakness and falls. Patient felt well until 3 days prior to presentation when started to feel weak and weakness has progressively worsened. Feels lightheaded upon standing. Patient reports spot on upper back that he first noticed about 1 week ago that broke open after a fall and was draining pus. Denies fever, chills, or other systemic symptoms. Chart review does not reveal any history of IVDU or recent cardiac/other surgery.  In ER, febrile to 38.4, tachycardic to 130s, elevated lactic acid to 3.3,  hypertensive. Glucose of > 600 and labs consistent with DKA. Received 1 dose cefepime 2g and 1 dose vancomycin 2,000mg  in ED. Blood cultures x2 drawn before antibiotic doses. Blood cultures reveal MSSA. No evidence for UTI on UA or pneumonia on CXR.   Review of Systems: Review of systems unable to be obtained by patient due to altered state   Past Medical History:  Diagnosis Date  . Diabetes mellitus without complication (HCC)   . ETOH abuse     Social History   Tobacco Use  . Smoking status: Never Smoker  . Smokeless tobacco: Never Used  Substance Use Topics  . Alcohol use: Not Currently  . Drug use: Never    No family history on file.  Allergies  Allergen Reactions  . Penicillins Swelling and Rash    Neck became swollen Did it involve swelling of the face/tongue/throat, SOB, or low BP? Yes Did it involve sudden or severe rash/hives, skin peeling, or any reaction on the inside of your mouth or nose? Unk Did you need to seek medical attention at a hospital or doctor's office? Unk When did it last happen? Happened in the 1980's If all above answers are "NO", may proceed with cephalosporin use.      Physical Exam: Constitutional: altered, agitated, no verbal response to questions Vitals:   01/08/19 1248 01/08/19 1308  BP:  130/77  Pulse:  (!) 104  Resp:  (!) 21  Temp: 99.7 F (37.6 C)   SpO2:  98%   EYES: anicteric ENMT: Cardiovascular: Regular rate and rhythm,  no murmurs/rubs/gallops Respiratory: Clear to auscultation bilaterally without wheezes/rhonchi/rales GI: Soft, bowel sounds present, no guarding Musculoskeletal: No peripheral edema no gross deformity Skin: Excoriations on bilateral shins, no janeway lesions/osler nodes detected Hematologic: No bruising or bleeding evident  Lab Results  Component Value Date   WBC 18.9 (H) 01/08/2019   HGB 12.1 (L) 01/08/2019   HCT 34.1 (L) 01/08/2019   MCV 93.2 01/08/2019   PLT 207 01/08/2019    Lab Results   Component Value Date   CREATININE 0.59 (L) 01/08/2019   BUN 13 01/08/2019   NA 131 (L) 01/08/2019   K 3.6 01/08/2019   CL 100 01/08/2019   CO2 20 (L) 01/08/2019    Lab Results  Component Value Date   ALT 39 01/07/2019   AST 32 01/07/2019   ALKPHOS 127 (H) 01/07/2019     Microbiology: Recent Results (from the past 240 hour(s))  Culture, blood (Routine x 2)     Status: None (Preliminary result)   Collection Time: 01/07/19 11:05 AM   Specimen: BLOOD  Result Value Ref Range Status   Specimen Description BLOOD BLOOD RIGHT FOREARM  Final   Special Requests   Final    BOTTLES DRAWN AEROBIC AND ANAEROBIC Blood Culture adequate volume   Culture  Setup Time   Final    GRAM POSITIVE COCCI IN CLUSTERS IN BOTH AEROBIC AND ANAEROBIC BOTTLES Organism ID to follow CRITICAL RESULT CALLED TO, READ BACK BY AND VERIFIED WITH: Antoine PrimasE. Martin PharmD 10:15 01/08/19 ([email or web address]) Performed at [facility], [address] [place], [place] [address]    Culture GRAM POSITIVE COCCI  Final   Report Status PENDING  Incomplete  Blood Culture ID Panel (Reflexed)     Status: Abnormal   Collection Time: 01/07/19 11:05 AM  Result Value Ref Range Status   Enterococcus species NOT DETECTED NOT DETECTED Final   Listeria monocytogenes NOT DETECTED NOT DETECTED Final   Staphylococcus species DETECTED (A) NOT DETECTED Final    Comment: CRITICAL RESULT CALLED TO, READ BACK BY AND VERIFIED WITH: Antoine PrimasE. Martin PharmD 10:15 01/08/19 ([email or web address])    Staphylococcus aureus (BCID) DETECTED (A) NOT DETECTED Final    Comment: Methicillin (oxacillin) susceptible Staphylococcus aureus (MSSA). Preferred therapy is anti staphylococcal beta lactam antibiotic (Cefazolin or Nafcillin), unless clinically contraindicated. CRITICAL RESULT CALLED TO, READ BACK BY AND VERIFIED WITH: Antoine PrimasE. Martin PharmD 10:15 01/08/19 ([email or web address])    Methicillin resistance NOT DETECTED NOT DETECTED Final   Streptococcus species NOT DETECTED NOT DETECTED Final    Streptococcus agalactiae NOT DETECTED NOT DETECTED Final   Streptococcus pneumoniae NOT DETECTED NOT DETECTED Final   Streptococcus pyogenes NOT DETECTED NOT DETECTED Final   Acinetobacter baumannii NOT DETECTED NOT DETECTED Final   Enterobacteriaceae species NOT DETECTED NOT DETECTED Final   Enterobacter cloacae complex NOT DETECTED NOT DETECTED Final   Escherichia coli NOT DETECTED NOT DETECTED Final   Klebsiella oxytoca NOT DETECTED NOT DETECTED Final   Klebsiella pneumoniae NOT DETECTED NOT DETECTED Final   Proteus species NOT DETECTED NOT DETECTED Final   Serratia marcescens NOT DETECTED NOT DETECTED Final   Haemophilus influenzae NOT DETECTED NOT DETECTED Final   Neisseria meningitidis NOT DETECTED NOT DETECTED Final   Pseudomonas aeruginosa NOT DETECTED NOT DETECTED Final   Candida albicans NOT DETECTED NOT DETECTED Final   Candida glabrata NOT DETECTED NOT DETECTED Final   Candida krusei NOT DETECTED NOT DETECTED Final   Candida parapsilosis NOT DETECTED NOT DETECTED Final   Candida tropicalis NOT DETECTED NOT DETECTED  Final    Comment: Performed at [facility], [place] [address] [place], [place] [address]  Culture, blood (Routine x 2)     Status: None (Preliminary result)   Collection Time: 01/07/19  5:25 PM   Specimen: BLOOD LEFT ARM  Result Value Ref Range Status   Specimen Description BLOOD LEFT ARM  Final   Special Requests   Final    BOTTLES DRAWN AEROBIC AND ANAEROBIC Blood Culture results may not be optimal due to an inadequate volume of blood received in culture bottles   Culture  Setup Time   Final    GRAM POSITIVE COCCI AEROBIC BOTTLE ONLY CRITICAL RESULT CALLED TO, READ BACK BY AND VERIFIED WITH: PHARMD E MARTIN [id number] AT 1107 AM BY CM    Culture   Final    NO GROWTH < 24 HOURS Performed at [facility], [place] [address], [place], [place] [address]    Report Status PENDING  Incomplete  SARS Coronavirus 2 (CEPHEID - Performed in [place] hospital  lab), Hosp Order     Status: None   Collection Time: 01/07/19  5:35 PM   Specimen: Nasopharyngeal Swab  Result Value Ref Range Status   SARS Coronavirus 2 NEGATIVE NEGATIVE Final    Comment: (NOTE) If result is NEGATIVE SARS-CoV-2 target nucleic acids are NOT DETECTED. The SARS-CoV-2 RNA is generally detectable in upper and lower  respiratory specimens during the acute phase of infection. The lowest  concentration of SARS-CoV-2 viral copies this assay can detect is 250  copies / mL. A negative result does not preclude SARS-CoV-2 infection  and should not be used as the sole basis for treatment or other  patient management decisions.  A negative result may occur with  improper specimen collection / handling, submission of specimen other  than nasopharyngeal swab, presence of viral mutation(s) within the  areas targeted by this assay, and inadequate number of viral copies  (<250 copies / mL). A negative result must be combined with clinical  observations, patient history, and epidemiological information. If result is POSITIVE SARS-CoV-2 target nucleic acids are DETECTED. The SARS-CoV-2 RNA is generally detectable in upper and lower  respiratory specimens dur ing the acute phase of infection.  Positive  results are indicative of active infection with SARS-CoV-2.  Clinical  correlation with patient history and other diagnostic information is  necessary to determine patient infection status.  Positive results do  not rule out bacterial infection or co-infection with other viruses. If result is PRESUMPTIVE POSTIVE SARS-CoV-2 nucleic acids MAY BE PRESENT.   A presumptive positive result was obtained on the submitted specimen  and confirmed on repeat testing.  While 2019 novel coronavirus  (SARS-CoV-2) nucleic acids may be present in the submitted sample  additional confirmatory testing may be necessary for epidemiological  and / or clinical management purposes  to differentiate between   SARS-CoV-2 and other Sarbecovirus currently known to infect humans.  If clinically indicated additional testing with an alternate test  methodology [phone]) is advised. The SARS-CoV-2 RNA is generally  detectable in upper and lower respiratory sp ecimens during the acute  phase of infection. The expected result is Negative. Fact Sheet for Patients:  StrictlyIdeas.no Fact Sheet for Healthcare Providers: BankingDealers.co.za This test is not yet approved or cleared by the [place] FDA and has been authorized for detection and/or diagnosis of SARS-CoV-2 by FDA under an Emergency Use Authorization (EUA).  This EUA will remain in effect (meaning this test can be  used) for the duration of the COVID-19 declaration under Section 564(b)(1) of the Act, 21 U.S.C. section 360bbb-3(b)(1), unless the authorization is terminated or revoked sooner. Performed at [facility], [address] [place], [place] [address]     Katherine RoanMatthew Greer Koeppen, MD, PGY1 [facility] [email or web address] 01/08/2019, 2:35 PM

## 2019-01-08 NOTE — Progress Notes (Signed)
RN instructed to apply ice. Will continue to monitor pt closely.

## 2019-01-08 NOTE — Progress Notes (Signed)
Pt presented to the floor alert and oriented times 4. Skin was checked by the required two Rn's. Pt has scabs and scars about his arms and legs. Pt has a wound on the upper, middle area of his back that is draining, a foam dressing was placed on the wound. Pt was educated on the phone and call bell systems. Pt understood that he needs to use the call bed before attempting to get out of bed.

## 2019-01-08 NOTE — Progress Notes (Addendum)
PHARMACY - PHYSICIAN COMMUNICATION CRITICAL VALUE ALERT - BLOOD CULTURE IDENTIFICATION (BCID)  Frederick Moody is an 63 y.o. male who presented to [facility] on 01/07/2019 with a chief complaint of weakness and falls.   Assessment:  14 YOM on empiric antibiotics for rule out sepsis with potential source - back wound with ulceration/drainage/pus. Now with 2 of 4 blood cultures growing GPC with BCID detecting MSSA.   Addendum - received an updated call from Micro @ 1107 - showing that one of the anaerobic bottles is now showing GPC as well - so positive in at least 3 of 4 bottles thus far  Name of physician (or Provider) ContactedLeonides Moody  Current antibiotics: Vancomycin + Rocephin  Changes to prescribed antibiotics recommended:  Recommend de-escalation to Cefazolin monotherapy.   Recommendations accepted by provider  Results for orders placed or performed during the hospital encounter of 01/07/19  Blood Culture ID Panel (Reflexed) (Collected: 01/07/2019 11:05 AM)  Result Value Ref Range   Enterococcus species NOT DETECTED NOT DETECTED   Listeria monocytogenes NOT DETECTED NOT DETECTED   Staphylococcus species DETECTED (A) NOT DETECTED   Staphylococcus aureus (BCID) DETECTED (A) NOT DETECTED   Methicillin resistance NOT DETECTED NOT DETECTED   Streptococcus species NOT DETECTED NOT DETECTED   Streptococcus agalactiae NOT DETECTED NOT DETECTED   Streptococcus pneumoniae NOT DETECTED NOT DETECTED   Streptococcus pyogenes NOT DETECTED NOT DETECTED   Acinetobacter baumannii NOT DETECTED NOT DETECTED   Enterobacteriaceae species NOT DETECTED NOT DETECTED   Enterobacter cloacae complex NOT DETECTED NOT DETECTED   Escherichia coli NOT DETECTED NOT DETECTED   Klebsiella oxytoca NOT DETECTED NOT DETECTED   Klebsiella pneumoniae NOT DETECTED NOT DETECTED   Proteus species NOT DETECTED NOT DETECTED   Serratia marcescens NOT DETECTED NOT DETECTED   Haemophilus influenzae NOT DETECTED NOT  DETECTED   Neisseria meningitidis NOT DETECTED NOT DETECTED   Pseudomonas aeruginosa NOT DETECTED NOT DETECTED   Candida albicans NOT DETECTED NOT DETECTED   Candida glabrata NOT DETECTED NOT DETECTED   Candida krusei NOT DETECTED NOT DETECTED   Candida parapsilosis NOT DETECTED NOT DETECTED   Candida tropicalis NOT DETECTED NOT DETECTED    Thank you for allowing pharmacy to be a part of this patient's care.  Alycia Rossetti, PharmD, BCPS Clinical Pharmacist Clinical phone for 01/08/2019: [phone] 01/08/2019 10:28 AM   **Pharmacist phone directory can now be found on [email or web address] (PW [email or web address]).  Listed under [place].

## 2019-01-08 NOTE — Progress Notes (Signed)
On assessment pts CIWA 10, no PRN orders for ativan Dr. Addison Lank notfied and PRN orders placed, pt given 2mg  per order. Will continue to monitor

## 2019-01-08 NOTE — Progress Notes (Signed)
Pt temp 100.5 MD notified prn order for tylenol placed will continue to monitor

## 2019-01-08 NOTE — Progress Notes (Signed)
Inpatient Diabetes Program Recommendations  AACE/ADA: New Consensus Statement on Inpatient Glycemic Control (2015)  Target Ranges:  Prepandial:   less than 140 mg/dL      Peak postprandial:   less than 180 mg/dL (1-2 hours)      Critically ill patients:  140 - 180 mg/dL     Review of Glycemic Control  Diabetes history: DM 2 Outpatient Diabetes medications: Metformin reported as not taking Current orders for Inpatient glycemic control: IV insulin gtt  Inpatient Diabetes Program Recommendations:    Insulin gtt rate between 2-4 units/hour. CO2 normalized.  Consider Latus 16 units (0.2 units/kg). Novolog 0-15 units tid + 0-5 units qhs for bedtime coverage.   Per RN notes pt was slightly agitated this am. Will try to see patient. A1c level 11.7% and pt would benefit from insulin at time of d/c. Patient is also without insurance and will need close follow up.  Thanks,  Tama Headings RN, MSN, BC-ADM Inpatient Diabetes Coordinator Team Pager [phone] (8a-5p)

## 2019-01-08 NOTE — Progress Notes (Signed)
Unable to apply cooling blanket, non available at this time, material will provide one when available.  Lovena Neighbours, MD notified.  Pt HR still in 130s, RR 20s to 30s, Pt responds to touch, pt non verbal since beginning of the shift. Pt sister updated.

## 2019-01-08 NOTE — Progress Notes (Signed)
MD paged 3 times with no response. Charge RT got ABG on first try.    Ref. Range 01/08/2019 11:30  Sample type Unknown ARTERIAL  FIO2 Unknown 0.21  pH, Arterial Latest Ref Range: 7.350 - 7.450  7.468 (H)  pCO2 arterial Latest Ref Range: 32.0 - 48.0 mmHg 30.1 (L)  pO2, Arterial Latest Ref Range: 83.0 - 108.0 mmHg 57.8 (L)  Acid-base deficit Latest Ref Range: 0.0 - 2.0 mmol/L 1.7  Bicarbonate Latest Ref Range: 20.0 - 28.0 mmol/L 21.5  O2 Saturation Latest Units: % 90.4  Patient temperature Unknown 98.6  Collection site Unknown RADIAL  Allens test (pass/fail) Latest Ref Range: PASS  PASS

## 2019-01-08 NOTE — Significant Event (Signed)
Rapid Response Event Note -- Agitation   Overview:  Called: 2000pm Arrived: 2006pm     Initial Focused Assessment: Patient laying in bed, restless but cooperative.  Increased resp rate 30s, tachycardic 130s, RN taking rectal temp 102.  RN had given ativan prior to calling, felt that it had taken effect before my arrival. Patient more comfortable, on simple face mask, sats 100%.   Interventions: -RN paged MD - cooling blanket ordered  Plan of Care (if not transferred): -PRN ativan per CIWA -Tylenol and cooling blanket to control temp  -Call RRT as needed    Elayjah Chaney, Lattie Haw

## 2019-01-08 NOTE — Progress Notes (Signed)
Inpatient Diabetes Program Recommendations  AACE/ADA: New Consensus Statement on Inpatient Glycemic Control (2015)  Target Ranges:  Prepandial:   less than 140 mg/dL      Peak postprandial:   less than 180 mg/dL (1-2 hours)      Critically ill patients:  140 - 180 mg/dL   Lab Results  Component Value Date   GLUCAP 147 (H) 01/08/2019   HGBA1C 11.7 (H) 01/07/2019     Attempted to speak with patient in regards to A1c and glucose control at home. Patient lethargic and not responding to questions correctly. Will try to see another time.  Thanks,  Tama Headings RN, MSN, BC-ADM Inpatient Diabetes Coordinator Team Pager [phone] (8a-5p)

## 2019-01-08 NOTE — Progress Notes (Signed)
This nurse spoke to [employer] and notified to contact MD requesting order for PICC or other central line placement d/t multiple incompatible meds and multiple PIV restarts. VU. Fran Lowes, RN VAST

## 2019-01-08 NOTE — Progress Notes (Addendum)
Upon entering pt room to do assessment, pt very anxious and agitated, attempting to take his clothes off and pull lines. Does not answer any questions at this time, and does not fallow commands. BP 123/79, HR in 130s, RR 30-40, Spo2 in 90s on nasal cannula 2 L. CIWA completed, and 2 mg of Ativan given as ordered.   About 10 minutes later, Temp 102.6, BP stable, HR 130s, RR 18-26, SpO2 in 90s on venturi mask 2 L (dropped previously to 80s) Rapid response RN at the bedside. Pt less agitated. He does have apnea that got worse after ativan was given. That partially contributes to tachypnea. MD on call notified. Will place new order for cooling blanket.

## 2019-01-08 NOTE — Progress Notes (Signed)
Dear Doctor: This patient has been identified as a candidate for PICC for the following reason (s): incompatible drugs (aminophyllin, TPN, heparin, given with an antibiotic) If you agree, please write an order for the indicated device.  Thank you for supporting the early vascular access assessment program.

## 2019-01-08 NOTE — Progress Notes (Signed)
Patients heart rate sustaining in 120s. Dr. [name] paged, 1 liter bolus ordered. Will continue to monitor.

## 2019-01-08 NOTE — Progress Notes (Signed)
Dr was paged, bolus was ordered, due to heart rate increasing to the 140's. Pt is getting increasingly more agitated. Pt began pulling off leads and condom cath a number of times. Dr. stated to just watch him and contact them if thing get worse due to low CIWA score.

## 2019-01-08 NOTE — Progress Notes (Signed)
ABG attempted 4 times with last time being venous. RT ran results and will let MD know.

## 2019-01-08 NOTE — Progress Notes (Signed)
Subjective:  Paged this morning by nurse regarding patient's increased agitation, tachycardia, and CIWA score of 10. He was given one dose of Ativan with mild improvement in tachycardia to 110's. Nurse reports that he now appears less agitated. He had some mild desats into the low 90's while sleeping and was placed on [place] 2L. Denies pain or difficulty breathing when asked.  Objective:  Vital signs in last 24 hours: Vitals:   01/08/19 1109 01/08/19 1224 01/08/19 1248 01/08/19 1308  BP: 137/83 135/81  130/77  Pulse: (!) 118   (!) 104  Resp: (!) 29   (!) 21  Temp:   99.7 F (37.6 C)   TempSrc:   Oral   SpO2: 100%   98%  Weight:      Height:       Weight change:   Intake/Output Summary (Last 24 hours) at 01/08/2019 1519 Last data filed at 01/08/2019 0400 Gross per 24 hour  Intake 1292.64 ml  Output 650 ml  Net 642.64 ml    Ref Range & Units 02:28 (01/08/19) 1d ago (01/07/19) 1d ago (01/07/19) 50mo ago (02/18/18)  WBC 4.0 - 10.5 K/uL 18.9High    24.5High   4.5   RBC 4.22 - 5.81 MIL/uL 3.66Low    4.37  4.21Low    Hemoglobin 13.0 - 17.0 g/dL 12.1Low   15.0  14.5  14.4   HCT 39.0 - 52.0 % 34.1Low   44.0  41.0  41.4   MCV 80.0 - 100.0 fL 93.2   93.8  98.3 R   MCH 26.0 - 34.0 pg 33.1   33.2  34.2High    MCHC 30.0 - 36.0 g/dL 35.5   35.4  34.8   RDW 11.5 - 15.5 % 11.6   11.5  12.0   Platelets 150 - 400 K/uL 207   277  159 CM   nRBC 0.0 - 0.2 % 0.0   0.0      Ref Range & Units 12:07 07:59 02:28 00:21  Sodium 135 - 145 mmol/Moody 131Low   132Low   132Low   131Low    Potassium 3.5 - 5.1 mmol/Moody 3.6  3.4Low   3.8  3.7   Chloride 98 - 111 mmol/Moody 100  99  99  98   CO2 22 - 32 mmol/Moody 20Low   20Low   19Low   19Low    Glucose, Bld 70 - 99 mg/dL 148High   130High   245High   252High    BUN 8 - 23 mg/dL 13  15  21  21    Creatinine, Ser 0.61 - 1.24 mg/dL 0.59Low   0.67  0.71  0.89   Calcium 8.9 - 10.3 mg/dL 8.2Low   8.4Low   8.7Low   8.6Low    GFR calc non Af Amer >60 mL/min >60  >60  >60  >60    GFR calc Af Amer >60 mL/min >60  >60  >60  >60   Anion gap 5 - 15 11  13  CM  14 CM  14 CM    Blood culture (01/07/19):  Staphylococcus aureus (BCID) Detected    Physical Exam  Constitutional: Laying down with legs hanging off of bed, somnolent appearing Head: Normocephalic and atraumatic. Cardiovascular: Tachycardic, normal rhythm, no murmur appreciated Respiratory: Increased work of breathing on 2L [place], clear to auscultation bilaterally GI: Soft. Normal bowel sounds, non-distended MSK: No edema.  Neurological: Oriented to name and place  Skin: Multiple erythematous excoriations without discharge present on  lower extremities bilaterally, wound present on middle upper back with purulent drainage (covered by dressing) Psychiatric: Unable to fully assess since patient was sedated from Ativan    Assessment/Plan:  Frederick Moody is a 63 year old male with a significant PMH of uncontrolled diabetes who presented to the ED with ongoing weakness, signs of systemic infection, and elevated blood glucose levels concerning for DKA induced by sepsis.  Principal Problem:   MSSA bacteremia Active Problems:   DKA (diabetic ketoacidoses) (HCC)   Alcohol withdrawal (HCC)   Sepsis secondary to MSSA bacteremia Today patient is now afebrile but has remained tachycardic and dyspneic. Blood pressure has remained stable. White count down to 18.9 from 24.5. He has a 5 cm wound on the middle aspect of his upper back that is draining purulent material. We believe this to be the likely source of his sepsis. His blood cultures returned positive for MSSA and ID was consulted. Following conversation with pharm, vancomycin was stopped and patient was transitioned to cefazolin. Given that his back wound is rather deep and is in close proximity to the vertebrae, there are some concerns for potential development of osteomyelitis. We will consider imaging for further evaluation once patient's mental status improves.  Urine cultures are still pending. - Blood cultures MSSA positive - Cefazolin started, Vancomycin stopped - ID Consulted - F/U urine culture - Wound care ordered - Will consider ultrasound for back wound once mental status improves - Trend CBC  Alcohol Use Disorder Paged this morning about patient's worsening agitation and CIWA of 10. Patient given Ativan with improvement in symptoms. Still tachycardic but blood pressure is stable. Will continue to monitor with CIWA protocol. - Continue CIWA protocol with Ativan - Folate 1mg , thiamine 100mg , and multivitamin  DKA Latest anion gap improved to 14 and blood glucose stable in the 140s. Potassium dropped from 3.8 to 3.4 and we will continue to replete as needed. Given his increased agitation and change in mental status he will remain NPO and continue on insulin drip and fluids. He has been given ~6.6L since admission. -order additional bolus of 1L NS in addition to the 2.5L NS he received in the ED - Continue D5 1/2NS + KCl 30mEq @ 15950mL/hr - on insulin drip per DKA protocol - CBG q1h - trend BMPs q4h - strict Is and Os - continue continuous pulse ox and cardiac monitoring  Diet - NPO, okay for ice chips and sips with meds Fluids - D5 1/2NS + KCl 30mEq @ 12450mL/hr DVT ppx - enoxaparin 40mg  subQ daily CODE STATUS - FULL CODE   LOS: 1 day   Frederick Moody, Frederick Moody, Medical Student 01/08/2019, 3:19 PM

## 2019-01-09 ENCOUNTER — Inpatient Hospital Stay (HOSPITAL_COMMUNITY): Payer: Medicaid Other

## 2019-01-09 DIAGNOSIS — I340 Nonrheumatic mitral (valve) insufficiency: Secondary | ICD-10-CM

## 2019-01-09 LAB — GLUCOSE, CAPILLARY
Glucose-Capillary: 100 mg/dL — ABNORMAL HIGH (ref 70–99)
Glucose-Capillary: 125 mg/dL — ABNORMAL HIGH (ref 70–99)
Glucose-Capillary: 125 mg/dL — ABNORMAL HIGH (ref 70–99)
Glucose-Capillary: 125 mg/dL — ABNORMAL HIGH (ref 70–99)
Glucose-Capillary: 137 mg/dL — ABNORMAL HIGH (ref 70–99)
Glucose-Capillary: 137 mg/dL — ABNORMAL HIGH (ref 70–99)
Glucose-Capillary: 139 mg/dL — ABNORMAL HIGH (ref 70–99)
Glucose-Capillary: 147 mg/dL — ABNORMAL HIGH (ref 70–99)
Glucose-Capillary: 147 mg/dL — ABNORMAL HIGH (ref 70–99)
Glucose-Capillary: 150 mg/dL — ABNORMAL HIGH (ref 70–99)
Glucose-Capillary: 150 mg/dL — ABNORMAL HIGH (ref 70–99)
Glucose-Capillary: 156 mg/dL — ABNORMAL HIGH (ref 70–99)
Glucose-Capillary: 164 mg/dL — ABNORMAL HIGH (ref 70–99)
Glucose-Capillary: 179 mg/dL — ABNORMAL HIGH (ref 70–99)
Glucose-Capillary: 181 mg/dL — ABNORMAL HIGH (ref 70–99)
Glucose-Capillary: 186 mg/dL — ABNORMAL HIGH (ref 70–99)
Glucose-Capillary: 200 mg/dL — ABNORMAL HIGH (ref 70–99)
Glucose-Capillary: 202 mg/dL — ABNORMAL HIGH (ref 70–99)
Glucose-Capillary: 240 mg/dL — ABNORMAL HIGH (ref 70–99)
Glucose-Capillary: 98 mg/dL (ref 70–99)

## 2019-01-09 LAB — BASIC METABOLIC PANEL
Anion gap: 13 (ref 5–15)
Anion gap: 8 (ref 5–15)
Anion gap: 12 (ref 5–15)
Anion gap: 10 (ref 5–15)
Anion gap: 8 (ref 5–15)
BUN: 9 mg/dL (ref 8–23)
BUN: 8 mg/dL (ref 8–23)
BUN: 8 mg/dL (ref 8–23)
BUN: 7 mg/dL — ABNORMAL LOW (ref 8–23)
BUN: 6 mg/dL — ABNORMAL LOW (ref 8–23)
CO2: 20 mmol/L — ABNORMAL LOW (ref 22–32)
CO2: 22 mmol/L (ref 22–32)
CO2: 19 mmol/L — ABNORMAL LOW (ref 22–32)
CO2: 21 mmol/L — ABNORMAL LOW (ref 22–32)
CO2: 24 mmol/L (ref 22–32)
Calcium: 8.3 mg/dL — ABNORMAL LOW (ref 8.9–10.3)
Calcium: 7.7 mg/dL — ABNORMAL LOW (ref 8.9–10.3)
Calcium: 7.8 mg/dL — ABNORMAL LOW (ref 8.9–10.3)
Calcium: 7.7 mg/dL — ABNORMAL LOW (ref 8.9–10.3)
Calcium: 7.6 mg/dL — ABNORMAL LOW (ref 8.9–10.3)
Chloride: 99 mmol/L (ref 98–111)
Chloride: 100 mmol/L (ref 98–111)
Chloride: 99 mmol/L (ref 98–111)
Chloride: 99 mmol/L (ref 98–111)
Chloride: 98 mmol/L (ref 98–111)
Creatinine, Ser: 0.7 mg/dL (ref 0.61–1.24)
Creatinine, Ser: 0.55 mg/dL — ABNORMAL LOW (ref 0.61–1.24)
Creatinine, Ser: 0.57 mg/dL — ABNORMAL LOW (ref 0.61–1.24)
Creatinine, Ser: 0.62 mg/dL (ref 0.61–1.24)
Creatinine, Ser: 0.64 mg/dL (ref 0.61–1.24)
GFR calc Af Amer: >60 mL/min (ref >60)
GFR calc Af Amer: >60 mL/min (ref >60)
GFR calc Af Amer: >60 mL/min (ref >60)
GFR calc Af Amer: >60 mL/min (ref >60)
GFR calc Af Amer: >60 mL/min (ref >60)
GFR calc non Af Amer: >60 mL/min (ref >60)
GFR calc non Af Amer: >60 mL/min (ref >60)
GFR calc non Af Amer: >60 mL/min (ref >60)
GFR calc non Af Amer: >60 mL/min (ref >60)
GFR calc non Af Amer: >60 mL/min (ref >60)
Glucose, Bld: 183 mg/dL — ABNORMAL HIGH (ref 70–99)
Glucose, Bld: 154 mg/dL — ABNORMAL HIGH (ref 70–99)
Glucose, Bld: 199 mg/dL — ABNORMAL HIGH (ref 70–99)
Glucose, Bld: 104 mg/dL — ABNORMAL HIGH (ref 70–99)
Glucose, Bld: 140 mg/dL — ABNORMAL HIGH (ref 70–99)
Potassium: 3.4 mmol/L — ABNORMAL LOW (ref 3.5–5.1)
Potassium: 3.6 mmol/L (ref 3.5–5.1)
Potassium: 3.9 mmol/L (ref 3.5–5.1)
Potassium: 3.4 mmol/L — ABNORMAL LOW (ref 3.5–5.1)
Potassium: 3.4 mmol/L — ABNORMAL LOW (ref 3.5–5.1)
Sodium: 132 mmol/L — ABNORMAL LOW (ref 135–145)
Sodium: 130 mmol/L — ABNORMAL LOW (ref 135–145)
Sodium: 130 mmol/L — ABNORMAL LOW (ref 135–145)
Sodium: 130 mmol/L — ABNORMAL LOW (ref 135–145)
Sodium: 130 mmol/L — ABNORMAL LOW (ref 135–145)

## 2019-01-09 LAB — CBC
HCT: 33 % — ABNORMAL LOW (ref 39.0–52.0)
Hemoglobin: 11.7 g/dL — ABNORMAL LOW (ref 13.0–17.0)
MCH: 32.8 pg (ref 26.0–34.0)
MCHC: 35.5 g/dL (ref 30.0–36.0)
MCV: 92.4 fL (ref 80.0–100.0)
Platelets: 156 10*3/uL (ref 150–400)
RBC: 3.57 MIL/uL — ABNORMAL LOW (ref 4.22–5.81)
RDW: 11.5 % (ref 11.5–15.5)
WBC: 13.3 10*3/uL — ABNORMAL HIGH (ref 4.0–10.5)
nRBC: 0 % (ref 0.0–0.2)

## 2019-01-09 LAB — MAGNESIUM: Magnesium: 1.8 mg/dL (ref 1.7–2.4)

## 2019-01-09 LAB — ECHOCARDIOGRAM COMPLETE
Height: 69 in
Weight: 3153.46 oz

## 2019-01-09 LAB — HIV ANTIBODY (ROUTINE TESTING W REFLEX): HIV Screen 4th Generation wRfx: NONREACTIVE

## 2019-01-09 LAB — PHOSPHORUS: Phosphorus: 2.3 mg/dL — ABNORMAL LOW (ref 2.5–4.6)

## 2019-01-09 MED ORDER — POTASSIUM CHLORIDE 10 MEQ/100ML IV SOLN
10.0000 meq | INTRAVENOUS | Status: AC
  Administered 2019-01-09 (×3): 10 meq via INTRAVENOUS
  Filled 2019-01-09 (×3): qty 100

## 2019-01-09 MED ORDER — ACETAMINOPHEN 650 MG RE SUPP
650.0000 mg | Freq: Four times a day (QID) | RECTAL | Status: DC | PRN
  Administered 2019-01-09 – 2019-01-11 (×8): 650 mg via RECTAL
  Filled 2019-01-09 (×7): qty 1

## 2019-01-09 MED ORDER — ACETAMINOPHEN 60 MG HALF SUPP: 60.0000 mg | Freq: Four times a day (QID) | RECTAL | Status: DC | PRN

## 2019-01-09 MED ORDER — POTASSIUM PHOSPHATES 15 MMOLE/5ML IV SOLN
20.0000 mmol | Freq: Once | INTRAVENOUS | Status: AC
  Administered 2019-01-09: 08:00:00 20 mmol via INTRAVENOUS
  Filled 2019-01-09: qty 6.67

## 2019-01-09 MED ORDER — DEXTROSE-NACL 5-0.9 % IV SOLN
INTRAVENOUS | Status: DC
  Administered 2019-01-09 – 2019-01-12 (×7): via INTRAVENOUS

## 2019-01-09 NOTE — Progress Notes (Signed)
Pt alert and oriented to self and place. Overall improvement in pt condition, less agitated, fever has resolved, HR <120, RR 20-30, SpO2 98 on RA. No signs of distress.  Will continue to monitor and treat pt per MD orders.

## 2019-01-09 NOTE — Progress Notes (Addendum)
Subjective:  Overnight patient was febrile with increased agitation and confusion requiring a total of 6 mg of Ativan. He had a Tmax of 102.65F, was tachycardic into the 130's, stable bp, and SpO2 in the 90s while on Venturi mask 2 L.  Today vital signs have improved with decrease in temperature to 99.2 F.  Nurse reports his agitation has improved and he has remained rather somnolent.  Objective:  Vital signs in last 24 hours: Vitals:   01/09/19 0428 01/09/19 0438 01/09/19 0451 01/09/19 0500  BP:      Pulse:  (!) 114 (!) 111   Resp:  (!) 30 (!) 32   Temp: 99.2 F (37.3 C)     TempSrc: Axillary     SpO2:  100% 99%   Weight:    89.4 kg  Height:       Weight change: 10 kg  Intake/Output Summary (Last 24 hours) at 01/09/2019 1235 Last data filed at 01/09/2019 0524 Gross per 24 hour  Intake 4815.96 ml  Output 1250 ml  Net 3565.96 ml    Ref Range & Units 04:07 (01/09/19) 00:14 (01/09/19) 1d ago (01/08/19) 1d ago (01/08/19)  Sodium 135 - 145 mmol/L 130Low   132Low   131Low   131Low    Potassium 3.5 - 5.1 mmol/L 3.6  3.4Low   4.1  3.6   Chloride 98 - 111 mmol/L 100  99  100  100   CO2 22 - 32 mmol/L 22  20Low   20Low   20Low    Glucose, Bld 70 - 99 mg/dL 154High   183High   215High   148High    BUN 8 - 23 mg/dL 8  9  11  13    Creatinine, Ser 0.61 - 1.24 mg/dL 0.55Low   0.70  0.63  0.59Low    Calcium 8.9 - 10.3 mg/dL 7.7Low   8.3Low   8.2Low   8.2Low    GFR calc non Af Amer >60 mL/min >60  >60  >60  >60   GFR calc Af Amer >60 mL/min >60  >60  >60  >60   Anion gap 5 - 15 8  13  CM  11 CM  11 CM   Comment: Performed at [facility], [address], [place], [place] [address]    Ref Range & Units 04:07 (01/09/19) 1d ago (01/08/19) 2d ago (01/07/19) 2d ago (01/07/19)  WBC 4.0 - 10.5 K/uL 13.3High   18.9High    24.5High    RBC 4.22 - 5.81 MIL/uL 3.57Low   3.66Low    4.37   Hemoglobin 13.0 - 17.0 g/dL 11.7Low   12.1Low   15.0  14.5   HCT 39.0 - 52.0 % 33.0Low   34.1Low   44.0   41.0   MCV 80.0 - 100.0 fL 92.4  93.2   93.8   MCH 26.0 - 34.0 pg 32.8  33.1   33.2   MCHC 30.0 - 36.0 g/dL 35.5  35.5   35.4   RDW 11.5 - 15.5 % 11.5  11.6   11.5   Platelets 150 - 400 K/uL 156  207   277   nRBC 0.0 - 0.2 % 0.0  0.0 CM   0.0    ULTRASOUND OF HEAD/NECK SOFT TISSUES (01/08/19) IMPRESSION: No discrete fluid collection to suggest abscess.  Physical Exam  Constitutional: Laying in bed somnolent and difficult to arouse Head: Normocephalic and atraumatic. Cardiovascular: Tachycardic, normal rhythm, no murmur appreciated Respiratory: Increased work of  breathing on room air, on auscultation patient with transmitted upper airway sounds with possibility of mild crackles at the base of lungs GI: Soft. Normal bowel sounds, non-distended MSK: No edema.  Neurological: Arousable with sternal rub but unable to appropriately respond Skin: Multiple erythematous excoriations without discharge present on lower extremities bilaterally, wound present on middle upper back with purulent drainage (covered by dressing), redness, warmth, and slight swelling noted on the medial aspect of left elbow. Psychiatric: Unable to fully assess since patient was sedated from Ativan    Assessment/Plan:  Frederick Moody is a 63 year old male with a significant PMH of uncontrolled diabetes who presented to the ED with ongoing weakness, signs of systemic infection, and elevated blood glucose levels concerning for DKA induced by sepsis.  Principal Problem:   MSSA bacteremia Active Problems:   DKA (diabetic ketoacidoses) (HCC)   Alcohol withdrawal (HCC)   Sepsis secondary to MSSA bacteremia Patient presentation has improved following his overnight fever, tachycardia, increased agitation.  Nursing requested placement of PICC line but seeing as how patient is currently bacteremic we will hold off on doing so.  Most recent temperature 99.2 F, down from 102.6 F.  White count has continued to decline and is  currently 13.3 down from 18.9.  Hemoglobin has remained stable at 11.7.  He is currently being continued on cefazolin for his MSSA bacteremia.  Yesterday's soft tissue ultrasound showed no evidence of abscess formation around the site of his upper back wound which is productive of purulent drainage.  His exam today was notable for new redness and warmth on the medial aspect of his left elbow concerning for development of infection.  Will further evaluate this new finding with ultrasound today.  Orders placed for follow-up blood culture today.  Still awaiting urine culture findings. - Blood cultures MSSA positive - Continue cefazolin - ID following - F/U urine culture - Wound care ordered  - Ultrasound of back wound yesterday with no evidence of abcess - Trend CBC - Upper extremity ultrasound  Alcohol Use Disorder Alcohol Withdrawal Required total of 6 mg Ativan overnight following episode of increased agitation and elevated temperature to 102.6 F.  Recent CIWA of 11.  Nursing with reports of continued agitation this morning requiring Ativan and mittens to be placed on patient's hands.  Went to reevaluate patient in the afternoon and he appeared stable. - Continue CIWA protocol with Ativan - Folate 1mg , thiamine 100mg , and multivitamin  Altered Mental Status Right now his sepsis and alcohol withdrawal may be potentially contributing to his altered mental status.  His bacteremia is currently being treated with cefazolin and his white count has continued to trend down making alcohol withdrawal the likely cause. We spoke with the patient's nurse who voiced concerns about not being able to manage the patient due to the unit being short staffed.  Discussed with the charge nurse the possibility of transferring patient to another progressive unit with a more manageable staff to patient ratio.  He did not appear severely altered outside of the normal progression of alcohol withdrawal symptoms when we went  to reexamine him this afternoon.  He did appear to be mild to moderately tachypneic on exam but was appropriately protecting his airway and satting in the upper 90s on room air.  Ideally we would like to avoid sending a patient who is not medically deteriorating to the ICU in order to avoid an ICU bed shortage. - Delirium precautions in place  DKA Anion gap is 12  and most recent blood glucose is 179.  He may be becoming acidotic again sing as how there was a dip in his bicarbonate from 22 to 19 as well as a temporary bump in glucose to 240.  Potassium improved to 3.9 from 3.4. Secondary infection or abscess can be causes of rebound acidosis in DKA patients.  Given his continued agitation and altered mental status he will remain NPO and continue on insulin drip and fluids.  His sodium has dropped to 130 so we will will adjust his continuous fluids from D5 half-normal saline to D5 normal saline.  Bicarb stable at 22.  We are awaiting improvement in patient's mental status in order to transition to p.o. intake.  Will consider giving additional bicarb if acidosis worsens. - Switch from D5 1/2NS + KCl 30mEq @ 13550mL/hr  to  D5 NS @ 11700mL/hr - on insulin drip per DKA protocol - CBG q1h - trend BMPs q4h - strict Is and Os - continue continuous pulse ox and cardiac monitoring  Diet - NPO, okay for ice chips and sips with meds Fluids - D5 NS @ 15700mL/hr DVT ppx - enoxaparin 40mg  subQ daily CODE STATUS - FULL CODE   LOS: 2 days   [name], Karrington Mccravy L, Medical Student 01/09/2019, 12:35 PM

## 2019-01-09 NOTE — Progress Notes (Addendum)
Frederick Moody for Infectious Disease   Reason for visit: Follow up on MSSA bacteremia  Subjective: Unable to be obtained due to AMS. Patient stated his name and that he was in the hospital. Could not answer further questions.  Interval History: Patient febrile overnight to 102.6, tachycardic into 130s, stable BP and saturating well on room air. Cultures from 7/15 grew MSSA, repeat cultures drawn this AM (7/17). No evidence for vegetations on TTE. Urine cultures show staph aureus (40,000 c/ml), coag negative staph (20,000 c/ml), and viridans streptococcus (1000 c/ml).   Physical Exam: Constitutional: Agitated, altered, limited response to questions Vitals:   01/09/19 1245 01/09/19 1246  BP:  127/75  Pulse:  (!) 125  Resp:  (!) 24  Temp: 99.3 F (37.4 C) (!) 101.3 F (38.5 C)  SpO2:  98%  Respiratory: Normal respiratory effort; CTAB Cardiovascular: RRR, no m/r/g GI: Soft, NT, ND Skin: Multiple excoriations without discharge on bilateral lower extremities. Erythema and warmth on medial aspect of left elbow.  Review of Systems: Unable to be obtained due to AMS  Lab Results  Component Value Date   WBC 13.3 (H) 01/09/2019   HGB 11.7 (L) 01/09/2019   HCT 33.0 (L) 01/09/2019   MCV 92.4 01/09/2019   PLT 156 01/09/2019    Lab Results  Component Value Date   CREATININE 0.57 (L) 01/09/2019   BUN 8 01/09/2019   NA 130 (L) 01/09/2019   K 3.9 01/09/2019   CL 99 01/09/2019   CO2 19 (L) 01/09/2019    Lab Results  Component Value Date   ALT 39 01/07/2019   AST 32 01/07/2019   ALKPHOS 127 (H) 01/07/2019     Microbiology: Recent Results (from the past 240 hour(s))  Culture, blood (Routine x 2)     Status: Abnormal (Preliminary result)   Collection Time: 01/07/19 11:05 AM   Specimen: BLOOD  Result Value Ref Range Status   Specimen Description BLOOD BLOOD RIGHT FOREARM  Final   Special Requests   Final    BOTTLES DRAWN AEROBIC AND ANAEROBIC Blood Culture adequate volume   Culture  Setup Time   Final    GRAM POSITIVE COCCI IN CLUSTERS IN BOTH AEROBIC AND ANAEROBIC BOTTLES CRITICAL RESULT CALLED TO, READ BACK BY AND VERIFIED WITH: Ferne Coe PharmD 10:15 01/08/19 ([email or web address]) Performed at [facility], [place] [address] [place], [place] [address]    Culture STAPHYLOCOCCUS AUREUS (A)  Final   Report Status PENDING  Incomplete  Blood Culture ID Panel (Reflexed)     Status: Abnormal   Collection Time: 01/07/19 11:05 AM  Result Value Ref Range Status   Enterococcus species NOT DETECTED NOT DETECTED Final   Listeria monocytogenes NOT DETECTED NOT DETECTED Final   Staphylococcus species DETECTED (A) NOT DETECTED Final    Comment: CRITICAL RESULT CALLED TO, READ BACK BY AND VERIFIED WITH: Ferne Coe PharmD 10:15 01/08/19 ([email or web address])    Staphylococcus aureus (BCID) DETECTED (A) NOT DETECTED Final    Comment: Methicillin (oxacillin) susceptible Staphylococcus aureus (MSSA). Preferred therapy is anti staphylococcal beta lactam antibiotic (Cefazolin or Nafcillin), unless clinically contraindicated. CRITICAL RESULT CALLED TO, READ BACK BY AND VERIFIED WITH: Ferne Coe PharmD 10:15 01/08/19 ([email or web address])    Methicillin resistance NOT DETECTED NOT DETECTED Final   Streptococcus species NOT DETECTED NOT DETECTED Final   Streptococcus agalactiae NOT DETECTED NOT DETECTED Final   Streptococcus pneumoniae NOT DETECTED NOT DETECTED Final   Streptococcus pyogenes NOT DETECTED NOT DETECTED Final   Acinetobacter baumannii  NOT DETECTED NOT DETECTED Final   Enterobacteriaceae species NOT DETECTED NOT DETECTED Final   Enterobacter cloacae complex NOT DETECTED NOT DETECTED Final   Escherichia coli NOT DETECTED NOT DETECTED Final   Klebsiella oxytoca NOT DETECTED NOT DETECTED Final   Klebsiella pneumoniae NOT DETECTED NOT DETECTED Final   Proteus species NOT DETECTED NOT DETECTED Final   Serratia marcescens NOT DETECTED NOT DETECTED Final   Haemophilus influenzae NOT DETECTED NOT DETECTED  Final   Neisseria meningitidis NOT DETECTED NOT DETECTED Final   Pseudomonas aeruginosa NOT DETECTED NOT DETECTED Final   Candida albicans NOT DETECTED NOT DETECTED Final   Candida glabrata NOT DETECTED NOT DETECTED Final   Candida krusei NOT DETECTED NOT DETECTED Final   Candida parapsilosis NOT DETECTED NOT DETECTED Final   Candida tropicalis NOT DETECTED NOT DETECTED Final    Comment: Performed at [facility], [address] [place], [place] [address]  Culture, blood (Routine x 2)     Status: Abnormal (Preliminary result)   Collection Time: 01/07/19  5:25 PM   Specimen: BLOOD LEFT ARM  Result Value Ref Range Status   Specimen Description BLOOD LEFT ARM  Final   Special Requests   Final    BOTTLES DRAWN AEROBIC AND ANAEROBIC Blood Culture results may not be optimal due to an inadequate volume of blood received in culture bottles   Culture  Setup Time   Final    GRAM POSITIVE COCCI AEROBIC BOTTLE ONLY CRITICAL RESULT CALLED TO, READ BACK BY AND VERIFIED WITH: PHARMD E MARTIN [phone] AT 1107 AM BY CM Performed at [facility], [address] [place], [place] [address]    Culture STAPHYLOCOCCUS AUREUS (A)  Final   Report Status PENDING  Incomplete  SARS Coronavirus 2 (CEPHEID - Performed in [facility] lab), Hosp Order     Status: None   Collection Time: 01/07/19  5:35 PM   Specimen: Nasopharyngeal Swab  Result Value Ref Range Status   SARS Coronavirus 2 NEGATIVE NEGATIVE Final    Comment: (NOTE) If result is NEGATIVE SARS-CoV-2 target nucleic acids are NOT DETECTED. The SARS-CoV-2 RNA is generally detectable in upper and lower  respiratory specimens during the acute phase of infection. The lowest  concentration of SARS-CoV-2 viral copies this assay can detect is 250  copies / mL. A negative result does not preclude SARS-CoV-2 infection  and should not be used as the sole basis for treatment or other  patient management decisions.  A negative result may  occur with  improper specimen collection / handling, submission of specimen other  than nasopharyngeal swab, presence of viral mutation(s) within the  areas targeted by this assay, and inadequate number of viral copies  (<250 copies / mL). A negative result must be combined with clinical  observations, patient history, and epidemiological information. If result is POSITIVE SARS-CoV-2 target nucleic acids are DETECTED. The SARS-CoV-2 RNA is generally detectable in upper and lower  respiratory specimens dur ing the acute phase of infection.  Positive  results are indicative of active infection with SARS-CoV-2.  Clinical  correlation with patient history and other diagnostic information is  necessary to determine patient infection status.  Positive results do  not rule out bacterial infection or co-infection with other viruses. If result is PRESUMPTIVE POSTIVE SARS-CoV-2 nucleic acids MAY BE PRESENT.   A presumptive positive result was obtained on the submitted specimen  and confirmed on repeat testing.  While 2019 novel coronavirus  (SARS-CoV-2) nucleic acids  may be present in the submitted sample  additional confirmatory testing may be necessary for epidemiological  and / or clinical management purposes  to differentiate between  SARS-CoV-2 and other Sarbecovirus currently known to infect humans.  If clinically indicated additional testing with an alternate test  methodology [phone]([id number]) is advised. The SARS-CoV-2 RNA is generally  detectable in upper and lower respiratory sp ecimens during the acute  phase of infection. The expected result is Negative. Fact Sheet for Patients:  BoilerBrush.com.cyhttps://www.fda.gov/media/136312/download Fact Sheet for Healthcare Providers: https://pope.com/https://www.fda.gov/media/136313/download This test is not yet approved or cleared by the [place] FDA and has been authorized for detection and/or diagnosis of SARS-CoV-2 by FDA under an Emergency Use Authorization (EUA).  This  EUA will remain in effect (meaning this test can be used) for the duration of the COVID-19 declaration under Section 564(b)(1) of the Act, 21 U.S.C. section 360bbb-3(b)(1), unless the authorization is terminated or revoked sooner. Performed at [facility], [address] [place], [place] [address]   Urine culture     Status: Abnormal (Preliminary result)   Collection Time: 01/07/19  6:22 PM   Specimen: In/Out Cath Urine  Result Value Ref Range Status   Specimen Description IN/OUT CATH URINE  Final   Special Requests   Final    NONE Performed at [facility], [address] [place], [place] [address]    Culture (A)  Final    40,000 COLONIES/mL STAPHYLOCOCCUS AUREUS 20,000 COLONIES/mL COAGULASE NEGATIVE STAPHYLOCOCCUS 1,000 COLONIES/mL VIRIDANS STREPTOCOCCUS    Report Status PENDING  Incomplete    Impression: 63 yo male with PMH of uncontrolled DM who presented with weakness, fever, tachycardia, elevated lactic acid consistent with sepsis and found to have MSSA bacteremia. No evidence for vegetations on TTE.  Plan:  1. Continue on cefazolin Will need 14 day course following negative cultures provided no vegetations are identified. Cefazolin should provide adequate coverage for species present on urine culture. 2. Recommend evaluation for vegetations with TEE

## 2019-01-09 NOTE — Progress Notes (Signed)
Pt with an elevated temp of 101.3 rectally. Pt given 650 mg of tylenol rectally. Will continue to monitor.

## 2019-01-09 NOTE — Progress Notes (Signed)
  Echocardiogram 2D Echocardiogram has been performed.  Frederick Moody 01/09/2019, 11:11 AM

## 2019-01-09 NOTE — Progress Notes (Signed)
Pt returned from Ultrasound.

## 2019-01-09 NOTE — Progress Notes (Signed)
Gilford Rile, MD notified about pt labs being available in epic for review.

## 2019-01-09 NOTE — Progress Notes (Signed)
MD notified of pt's cbg's, AG closed, and CO2 within range for transition off IV insulin.  He called back and reported to Ginger, Agricultural consultant, that pt would need to remain on insulin gtt for now until mental status improves and he is able to take po's.  Will monitor closely

## 2019-01-09 NOTE — Progress Notes (Signed)
Spoke to pt's sister Neoma Laming to update on POC. Also notified provider with Deborah's information so that they can update her also. Will notify oncoming nurse to monitor pt.

## 2019-01-09 NOTE — Progress Notes (Signed)
Pt requiring frequent doses of ativan, agitated, pulling off equipment, and pulling out IV sites. Paged providers and rapid response to bedside to further assess pt. Will continue to monitor pt.

## 2019-01-10 ENCOUNTER — Inpatient Hospital Stay (HOSPITAL_COMMUNITY): Payer: Medicaid Other

## 2019-01-10 DIAGNOSIS — A419 Sepsis, unspecified organism: Secondary | ICD-10-CM

## 2019-01-10 DIAGNOSIS — J690 Pneumonitis due to inhalation of food and vomit: Secondary | ICD-10-CM

## 2019-01-10 LAB — BASIC METABOLIC PANEL
Anion gap: 11 (ref 5–15)
Anion gap: 7 (ref 5–15)
Anion gap: 11 (ref 5–15)
Anion gap: 14 (ref 5–15)
Anion gap: 8 (ref 5–15)
Anion gap: 11 (ref 5–15)
BUN: 6 mg/dL — ABNORMAL LOW (ref 8–23)
BUN: 7 mg/dL — ABNORMAL LOW (ref 8–23)
BUN: 6 mg/dL — ABNORMAL LOW (ref 8–23)
BUN: 7 mg/dL — ABNORMAL LOW (ref 8–23)
BUN: 7 mg/dL — ABNORMAL LOW (ref 8–23)
BUN: 7 mg/dL — ABNORMAL LOW (ref 8–23)
CO2: 22 mmol/L (ref 22–32)
CO2: 21 mmol/L — ABNORMAL LOW (ref 22–32)
CO2: 18 mmol/L — ABNORMAL LOW (ref 22–32)
CO2: 14 mmol/L — ABNORMAL LOW (ref 22–32)
CO2: 20 mmol/L — ABNORMAL LOW (ref 22–32)
CO2: 21 mmol/L — ABNORMAL LOW (ref 22–32)
Calcium: 7.8 mg/dL — ABNORMAL LOW (ref 8.9–10.3)
Calcium: 7.2 mg/dL — ABNORMAL LOW (ref 8.9–10.3)
Calcium: 7.5 mg/dL — ABNORMAL LOW (ref 8.9–10.3)
Calcium: 7.3 mg/dL — ABNORMAL LOW (ref 8.9–10.3)
Calcium: 7.2 mg/dL — ABNORMAL LOW (ref 8.9–10.3)
Calcium: 7.3 mg/dL — ABNORMAL LOW (ref 8.9–10.3)
Chloride: 97 mmol/L — ABNORMAL LOW (ref 98–111)
Chloride: 102 mmol/L (ref 98–111)
Chloride: 103 mmol/L (ref 98–111)
Chloride: 101 mmol/L (ref 98–111)
Chloride: 104 mmol/L (ref 98–111)
Chloride: 100 mmol/L (ref 98–111)
Creatinine, Ser: 0.64 mg/dL (ref 0.61–1.24)
Creatinine, Ser: 0.52 mg/dL — ABNORMAL LOW (ref 0.61–1.24)
Creatinine, Ser: 0.58 mg/dL — ABNORMAL LOW (ref 0.61–1.24)
Creatinine, Ser: 0.58 mg/dL — ABNORMAL LOW (ref 0.61–1.24)
Creatinine, Ser: 0.64 mg/dL (ref 0.61–1.24)
Creatinine, Ser: 0.55 mg/dL — ABNORMAL LOW (ref 0.61–1.24)
GFR calc Af Amer: >60 mL/min (ref >60)
GFR calc Af Amer: >60 mL/min (ref >60)
GFR calc Af Amer: >60 mL/min (ref >60)
GFR calc Af Amer: >60 mL/min (ref >60)
GFR calc Af Amer: >60 mL/min (ref >60)
GFR calc Af Amer: >60 mL/min (ref >60)
GFR calc non Af Amer: >60 mL/min (ref >60)
GFR calc non Af Amer: >60 mL/min (ref >60)
GFR calc non Af Amer: >60 mL/min (ref >60)
GFR calc non Af Amer: >60 mL/min (ref >60)
GFR calc non Af Amer: >60 mL/min (ref >60)
GFR calc non Af Amer: >60 mL/min (ref >60)
Glucose, Bld: 187 mg/dL — ABNORMAL HIGH (ref 70–99)
Glucose, Bld: 163 mg/dL — ABNORMAL HIGH (ref 70–99)
Glucose, Bld: 104 mg/dL — ABNORMAL HIGH (ref 70–99)
Glucose, Bld: 176 mg/dL — ABNORMAL HIGH (ref 70–99)
Glucose, Bld: 200 mg/dL — ABNORMAL HIGH (ref 70–99)
Glucose, Bld: 129 mg/dL — ABNORMAL HIGH (ref 70–99)
Potassium: 4.2 mmol/L (ref 3.5–5.1)
Potassium: 3.6 mmol/L (ref 3.5–5.1)
Potassium: 3.5 mmol/L (ref 3.5–5.1)
Potassium: 5.2 mmol/L — ABNORMAL HIGH (ref 3.5–5.1)
Potassium: 3.8 mmol/L (ref 3.5–5.1)
Potassium: 3.7 mmol/L (ref 3.5–5.1)
Sodium: 130 mmol/L — ABNORMAL LOW (ref 135–145)
Sodium: 130 mmol/L — ABNORMAL LOW (ref 135–145)
Sodium: 132 mmol/L — ABNORMAL LOW (ref 135–145)
Sodium: 129 mmol/L — ABNORMAL LOW (ref 135–145)
Sodium: 132 mmol/L — ABNORMAL LOW (ref 135–145)
Sodium: 132 mmol/L — ABNORMAL LOW (ref 135–145)

## 2019-01-10 LAB — GLUCOSE, CAPILLARY
Glucose-Capillary: 100 mg/dL — ABNORMAL HIGH (ref 70–99)
Glucose-Capillary: 107 mg/dL — ABNORMAL HIGH (ref 70–99)
Glucose-Capillary: 108 mg/dL — ABNORMAL HIGH (ref 70–99)
Glucose-Capillary: 118 mg/dL — ABNORMAL HIGH (ref 70–99)
Glucose-Capillary: 122 mg/dL — ABNORMAL HIGH (ref 70–99)
Glucose-Capillary: 133 mg/dL — ABNORMAL HIGH (ref 70–99)
Glucose-Capillary: 136 mg/dL — ABNORMAL HIGH (ref 70–99)
Glucose-Capillary: 150 mg/dL — ABNORMAL HIGH (ref 70–99)
Glucose-Capillary: 156 mg/dL — ABNORMAL HIGH (ref 70–99)
Glucose-Capillary: 157 mg/dL — ABNORMAL HIGH (ref 70–99)
Glucose-Capillary: 166 mg/dL — ABNORMAL HIGH (ref 70–99)
Glucose-Capillary: 172 mg/dL — ABNORMAL HIGH (ref 70–99)
Glucose-Capillary: 172 mg/dL — ABNORMAL HIGH (ref 70–99)
Glucose-Capillary: 178 mg/dL — ABNORMAL HIGH (ref 70–99)
Glucose-Capillary: 178 mg/dL — ABNORMAL HIGH (ref 70–99)
Glucose-Capillary: 190 mg/dL — ABNORMAL HIGH (ref 70–99)
Glucose-Capillary: 194 mg/dL — ABNORMAL HIGH (ref 70–99)
Glucose-Capillary: 196 mg/dL — ABNORMAL HIGH (ref 70–99)
Glucose-Capillary: 200 mg/dL — ABNORMAL HIGH (ref 70–99)
Glucose-Capillary: 200 mg/dL — ABNORMAL HIGH (ref 70–99)

## 2019-01-10 LAB — CULTURE, BLOOD (ROUTINE X 2): Special Requests: ADEQUATE

## 2019-01-10 LAB — URINALYSIS, ROUTINE W REFLEX MICROSCOPIC
Bacteria, UA: NONE SEEN
Bilirubin Urine: NEGATIVE
Glucose, UA: 150 mg/dL — AB
Ketones, ur: 5 mg/dL — AB
Nitrite: NEGATIVE
Protein, ur: NEGATIVE mg/dL
Specific Gravity, Urine: 1.015 (ref 1.005–1.030)
pH: 6 (ref 5.0–8.0)

## 2019-01-10 LAB — CBC
HCT: 32.6 % — ABNORMAL LOW (ref 39.0–52.0)
Hemoglobin: 11.5 g/dL — ABNORMAL LOW (ref 13.0–17.0)
MCH: 32.6 pg (ref 26.0–34.0)
MCHC: 35.3 g/dL (ref 30.0–36.0)
MCV: 92.4 fL (ref 80.0–100.0)
Platelets: 129 10*3/uL — ABNORMAL LOW (ref 150–400)
RBC: 3.53 MIL/uL — ABNORMAL LOW (ref 4.22–5.81)
RDW: 11.9 % (ref 11.5–15.5)
WBC: 10.6 10*3/uL — ABNORMAL HIGH (ref 4.0–10.5)
nRBC: 0 % (ref 0.0–0.2)

## 2019-01-10 LAB — URINE CULTURE: Culture: 40000 — AB

## 2019-01-10 LAB — MAGNESIUM: Magnesium: 1.7 mg/dL (ref 1.7–2.4)

## 2019-01-10 LAB — PHOSPHORUS: Phosphorus: 2.6 mg/dL (ref 2.5–4.6)

## 2019-01-10 MED ORDER — POTASSIUM CHLORIDE 10 MEQ/100ML IV SOLN
10.0000 meq | INTRAVENOUS | Status: AC
  Administered 2019-01-10 (×3): 10 meq via INTRAVENOUS
  Filled 2019-01-10 (×3): qty 100

## 2019-01-10 MED ORDER — SODIUM CHLORIDE 0.9 % IV SOLN
1.0000 g | Freq: Three times a day (TID) | INTRAVENOUS | Status: DC
  Administered 2019-01-10 – 2019-01-12 (×6): 1 g via INTRAVENOUS
  Filled 2019-01-10 (×8): qty 1

## 2019-01-10 MED ORDER — MAGNESIUM SULFATE 2 GM/50ML IV SOLN
2.0000 g | Freq: Once | INTRAVENOUS | Status: AC
  Administered 2019-01-10: 2 g via INTRAVENOUS
  Filled 2019-01-10: qty 50

## 2019-01-10 MED ORDER — CHLORDIAZEPOXIDE HCL 5 MG PO CAPS
25.0000 mg | ORAL_CAPSULE | Freq: Three times a day (TID) | ORAL | Status: DC
  Administered 2019-01-10 – 2019-01-11 (×3): 25 mg via ORAL
  Filled 2019-01-10 (×3): qty 1

## 2019-01-10 NOTE — Significant Event (Signed)
Rapid Response Event Note  Overview: Follow Up - Ongoing Tachycardia/Tachypnea  Initial Focused Assessment: I went by and saw [name] per request of the nursing staff. When I arrived, patient was sleeping, awoke to my voice, knew where is was and who he was (A/O x 2). Once awake, he was trying to pull his mittens off. HR 125-130, RR 34-38, + mild use of accessory muscles. Skin was very warm to touch, I noticed some trace mottling around his joints and on his chest. Lung sounds were clear in the upper fields, slightly diminished in the lower fields, but overall good air movement. Oxygen saturations were 94% on RA. Rectal temperature was 102.1  I paged the IMTS MD, updated them, and they came to bedside.  Interventions: -- APAP PR -- Ice packs  -- CXR  -- UA/UC -- ABX - Merrem  -- BMP pending   Plan of Care: -- Recheck temperature in an hour  -- Monitor VS  -- Monitor for signs of poor perfusion ( low UOP, skin temp/color changes, etc).  -- Recommend Insulin drip stay on since patient is now febrile and being re-cultured, patient's mental status has not improved enough for him to take POs. CO2 18/AG 11 at 0758   -- Monitor and Treat CIWA -- if administering Ativan IV -- please re-assess the patient's respiratory and neurologic status.  Event Summary:  Start Time 1105  End Time 1145   Frederick Moody R

## 2019-01-10 NOTE — Plan of Care (Signed)
  Problem: Safety: Goal: Ability to remain free from injury will improve Outcome: Progressing   Problem: Fluid Volume: Goal: Ability to achieve a balanced intake and output will improve Outcome: Progressing   Problem: Metabolic: Goal: Ability to maintain appropriate glucose levels will improve Outcome: Progressing   Problem: Urinary Elimination: Goal: Ability to achieve and maintain adequate renal perfusion and functioning will improve Outcome: Progressing   Problem: Education: Goal: Knowledge of General Education information will improve Description: Including pain rating scale, medication(s)/side effects and non-pharmacologic comfort measures Outcome: Not Progressing Note: Patient receptive to teaching, but has short term memory loss.   Problem: Clinical Measurements: Goal: Will remain free from infection Outcome: Not Progressing Note: Patient has current infection, and is currently being treated with IV antibiotics.   Problem: Nutrition: Goal: Adequate nutrition will be maintained Outcome: Not Progressing Note: Additional time needed to complete goal; patient is currently NPO.  Poor oral intake noted.   Problem: Skin Integrity: Goal: Risk for impaired skin integrity will decrease Outcome: Not Progressing Note: No progress made towards goal; patient has several scabs on extremities and wound on shoulder.   Problem: Cardiac: Goal: Ability to maintain an adequate cardiac output will improve Outcome: Not Progressing Note: No progress made towards goals; patient has elevated heart rate between 110-140 during shift.   Problem: Respiratory: Goal: Will regain and/or maintain adequate ventilation Outcome: Not Progressing Note: No progress made towards goals; patient continues to have elevated respiration rate ranging between 30-40.  Respiration rate fluctates frequently, currently febrile.

## 2019-01-10 NOTE — Progress Notes (Signed)
Paged internal medicine and asked about po medicines, stopping insulin drip, and if they wanted and I&O cath due to bladder scan post void residual of 533.    Asked about culturing purelent drainage from abscess near cervical vertebra. Internal medicine came to room to answer my questions.

## 2019-01-10 NOTE — Progress Notes (Signed)
Internal medicine doctors at bedside, temp 102.1 rectal. Tylenol administered.

## 2019-01-10 NOTE — Progress Notes (Signed)
Pharmacy Antibiotic Note  Frederick Moody is a 63 y.o. male admitted on 01/07/2019 with sepsis.  Pharmacy has been consulted for meropenem dosing to broaden coverage for fever; on cefazolin for MSSA bacteremia and MSSA/CoNS/Strep viridans in urine. Tm 102.1, WBC down to 10.6. SCr down to 0.58. TEE pending. Plan is 14 days antibiotics if TEE neg.  Pt has "swelling" PCN allergy listed, provider discussed w/ patient and determined it was not an anaphylactic reaction.   Plan: Meropenem 1 g IV q8h F/U renal function, clinical progress, C/S F/U ability to narrow, LOT, TEE results  Height: 5\' 9"  (175.3 cm) Weight: 196 lb 13.9 oz (89.3 kg) IBW/kg (Calculated) : 70.7  Temp (24hrs), Avg:100 F (37.8 C), Min:97.9 F (36.6 C), Max:102.1 F (38.9 C)  Recent Labs  Lab 01/07/19 1702 01/07/19 1902  01/08/19 0228  01/09/19 0407  01/09/19 1524 01/09/19 1940 01/10/19 0041 01/10/19 0433 01/10/19 0758  WBC 24.5*  --   --  18.9*  --  13.3*  --   --   --   --  10.6*  --   CREATININE 1.27*  --    < > 0.71   < > 0.55*   < > 0.62 0.64 0.64 0.52* 0.58*  LATICACIDVEN 3.3* 1.5  --   --   --   --   --   --   --   --   --   --    < > = values in this interval not displayed.    Estimated Creatinine Clearance: 104.4 mL/min (A) (by C-G formula based on SCr of 0.58 mg/dL (L)).    Allergies  Allergen Reactions  . Penicillins Swelling and Rash    Neck became swollen Did it involve swelling of the face/tongue/throat, SOB, or low BP? Yes Did it involve sudden or severe rash/hives, skin peeling, or any reaction on the inside of your mouth or nose? Unk Did you need to seek medical attention at a hospital or doctor's office? Unk When did it last happen? Happened in the 1980's If all above answers are "NO", may proceed with cephalosporin use.     Antimicrobials this admission: Vanc 7/15 >> 7/16 Cefepime 7/15 x 1 Rocephin 7/16 x 1 Cefazolin 7/16 >> 7/18 Meropenem 7/18 >>  Dose adjustments this  admission: n/a  Microbiology results: 7/15 UCx >> 40k Staph aureus, 20k CoNS, 1k viridans strep 7/15 BCx >> Staph aureus (BCID MSSA) 7/17 BCx >> ngtd   Thank you for involving pharmacy in this patient's care.  Renold Genta, PharmD, BCPS Clinical Pharmacist Clinical phone for 01/10/2019 until 3p is [phone] 01/10/2019 11:47 AM  **Pharmacist phone directory can be found on [email or web address] listed under [place]**

## 2019-01-10 NOTE — Progress Notes (Signed)
Subjective:  Overnight patient remained afebrile and hemodynamically stable.  He received a total of 6 mg Ativan overnight for management of withdrawal symptoms.  This morning patient is more alert and reports that he does not have any pain symptoms or shortness of breath.  Does complain of feeling cold and was given more blankets.  Denies nausea and vomiting and is requesting sips of water.  Objective:  Vital signs in last 24 hours: Vitals:   01/10/19 0700 01/10/19 0746 01/10/19 0900 01/10/19 0958  BP:  120/76 122/85 117/90  Pulse:  (!) 115  (!) 32  Resp: (!) 22 (!) 30  (!) 32  Temp:  97.9 F (36.6 C)    TempSrc:  Oral    SpO2:  99%    Weight:      Height:       Weight change: -0.1 kg  Intake/Output Summary (Last 24 hours) at 01/10/2019 1041 Last data filed at 01/10/2019 1016 Gross per 24 hour  Intake 1770.3 ml  Output 3083 ml  Net -1312.7 ml    Ref Range & Units 07:58 04:33 00:41 1d ago  Sodium 135 - 145 mmol/L 132Low   130Low   130Low   130Low    Potassium 3.5 - 5.1 mmol/L 3.5  3.6  4.2  3.4Low    Chloride 98 - 111 mmol/L 103  102  97Low   98   CO2 22 - 32 mmol/L 18Low   21Low   22  24   Glucose, Bld 70 - 99 mg/dL 161WRUE104High   454UJWJ163High   191YNWG187High   140High    BUN 8 - 23 mg/dL 6Low   7Low   6Low   6Low    Creatinine, Ser 0.61 - 1.24 mg/dL 9.56OZH0.58Low   0.52Low   0.64  0.64   Calcium 8.9 - 10.3 mg/dL 0.8MVH7.5Low   8.4ONG7.2Low   2.9BMW7.8Low   7.6Low    GFR calc non Af Amer >60 mL/min >60  >60  >60  >60   GFR calc Af Amer >60 mL/min >60  >60  >60  >60   Anion gap 5 - 15 11  7  CM  11 CM  8 CM     Ref Range & Units 04:33 1d ago 2d ago 3d ago  WBC 4.0 - 10.5 K/uL 10.6High   13.3High   18.9High     RBC 4.22 - 5.81 MIL/uL 3.53Low   3.57Low   3.66Low     Hemoglobin 13.0 - 17.0 g/dL 41.3KGM11.5Low   01.0UVO11.7Low   53.6UYQ12.1Low   15.0   HCT 39.0 - 52.0 % 32.6Low   33.0Low   34.1Low   44.0   MCV 80.0 - 100.0 fL 92.4  92.4  93.2    MCH 26.0 - 34.0 pg 32.6  32.8  33.1    MCHC 30.0 - 36.0 g/dL 03.435.3  74.235.5  59.535.5    RDW  11.5 - 15.5 % 11.9  11.5  11.6    Platelets 150 - 400 K/uL 129Low   156  207    nRBC 0.0 - 0.2 % 0.0  0.0 CM  0.0 CM      ULTRASOUND LEFT UPPER EXTREMITY (01/09/19) IMPRESSION: Focal area of soft tissue edema with surrounding Doppler flow in the medial aspect of left elbow which appears to be centered in the flexor musculature concerning for myositis. If there is further clinical concern regarding septic arthritis, recommend an MRI of the left elbow.  Physical Exam  Constitutional: Laying in  bed uncomfortable appearing but in no acute distress.  Head: Normocephalic and atraumatic. Cardiovascular: Tachycardic, normal rhythm, no murmur appreciated Respiratory: Increased work of breathing on room air, clear to auscultation bilaterally GI: Soft. Normal bowel sounds, non-distended MSK: No edema.  Neurological: Still somnolent but alert and appropriately responding to questions Skin: Multiple erythematous excoriations without discharge present on lower extremities bilaterally, wound present on middle upper back with purulent drainage (covered by dressing), redness, warmth, and slight swelling noted on the medial aspect of left elbow.   Assessment/Plan:  Frederick Moody is a 63 year old male with a significant PMH of uncontrolled diabetes who presented to the ED with ongoing weakness, signs of systemic infection, and elevated blood glucose levels concerning for DKA induced by sepsis.  Principal Problem:   MSSA bacteremia Active Problems:   DKA (diabetic ketoacidoses) ([place])   Alcohol withdrawal ([place])   Sepsis secondary to MSSA bacteremia Patient presentation has continued to improve and he has remained hemodynamically stable.  There was no recurrence and fever overnight and tachycardia has decreased into the 110s.  White count is now at 10.6 down from initial 18.9.  Left upper extremity ultrasound from yesterday notable for soft tissue edema concerning for myositis.  We will continue to  monitor that area around the medial aspect of his left elbow and consider MRI once mental status improves.  He is still on cefazolin for MSSA bacteremia and we are awaiting results from his follow-up blood culture.  There is a mild dip in hemoglobin to 11.5, and platelets to 129, likely a result of him being volume depleted/hemoconcentrated on admission and his current continuous IV fluids.  His labs may be lower at baseline due to his alcohol use disorder. - Initial blood culture MSSA positive, awaiting results of follow-up culture - Continue cefazolin - ID following - Trend CBC  Alcohol Use Disorder Alcohol Withdrawal He has continued to receive Ativan for management of his withdrawal symptoms.  He is still very tremulous and complains of feeling cold but denies nausea, vomiting, or any pain symptoms.  On exam he was able to appropriately engage in conversation and respond to questions. - Continue CIWA protocol with Ativan - Folate 1mg , thiamine 100mg , and multivitamin  Altered Mental Status Sepsis and alcohol withdrawal are likely contributing factors to his altered mental status.  His bacteremia is currently being treated with cefazolin and his white count has continued to trend down making alcohol withdrawal the likely cause.  There are noticeable improvements in his mental status today and he appears less somnolent.  He is able to tell us his name, location, and why he is currently in the hospital. - Delirium precautions in place  DKA Anion gap is closed and his blood sugars have remained stable. Bicarb decreased from 21 to 18 today.  We will keep him on continuous fluids and insulin drip for now but may consider giving additional bicarb if acidosis worsens.  Potassium is stable at 3.5 and most recent blood glucose is 104. If patient's mental status improves, we may consider transitioning him to p.o. intake. - Continue D5 NS @ 135mL/hr - on insulin drip per DKA protocol - CBG q1h - trend  BMPs q4h - strict Is and Os - continue continuous pulse ox and cardiac monitoring  Diet - NPO, okay for ice chips and sips with meds Fluids - D5 NS @ 158mL/hr DVT ppx - enoxaparin 40mg  subQ daily CODE STATUS - FULL CODE   LOS: 3 days  [name], Alizeh Madril L, Medical Student 01/10/2019, 10:41 AM

## 2019-01-10 NOTE — Progress Notes (Addendum)
  Interval Events: I&O cath performed around 9:30AM following bladder scan with postvoid residual of 533.  Paged by nursing around 11:25AM for concerns regarding worsening patient presentation.  Patient reportedly became tachypneic into the 40s and appeared to be more agitated.  At the bedside, patient more somnolent but arousable and giving nonverbal responses to questions.  Rectal temperature was measured at 102.1 F.  Tachypnea improved into the low 30s and he was satting 99% on room air.  He has been on cefazolin for the past 48 hours for management of his MSSA bacteremia and we are currently awaiting his follow-up blood cultures.  Seeing as how he is still spiking fevers we are concerned for the presence of another microbe not covered by his current antibiotic regimen contributing to his sepsis.  Although his recent ultrasound of his back wound was not suggestive of abscess formation, he would benefit from further evaluation with MRI.  However, patient's current mental status will not allow him to tolerate the study. We will repeat UA and culture, order CXR, and start meropenem to broaden coverage.  Luna Fuse, Medical Student 01/10/2019, 1:28 PM

## 2019-01-10 NOTE — Progress Notes (Signed)
Paged md through [facility] with current vitals, flailing, ? Pain, and bm post rectal tylenol status.

## 2019-01-11 ENCOUNTER — Inpatient Hospital Stay (HOSPITAL_COMMUNITY): Payer: Medicaid Other

## 2019-01-11 ENCOUNTER — Inpatient Hospital Stay: Payer: Self-pay

## 2019-01-11 DIAGNOSIS — R188 Other ascites: Secondary | ICD-10-CM

## 2019-01-11 DIAGNOSIS — M7989 Other specified soft tissue disorders: Secondary | ICD-10-CM

## 2019-01-11 DIAGNOSIS — R197 Diarrhea, unspecified: Secondary | ICD-10-CM

## 2019-01-11 LAB — BASIC METABOLIC PANEL
Anion gap: 12 (ref 5–15)
Anion gap: 14 (ref 5–15)
Anion gap: 8 (ref 5–15)
Anion gap: 8 (ref 5–15)
Anion gap: 9 (ref 5–15)
Anion gap: 9 (ref 5–15)
BUN: 5 mg/dL — ABNORMAL LOW (ref 8–23)
BUN: 6 mg/dL — ABNORMAL LOW (ref 8–23)
BUN: 6 mg/dL — ABNORMAL LOW (ref 8–23)
BUN: 6 mg/dL — ABNORMAL LOW (ref 8–23)
BUN: <5 mg/dL — ABNORMAL LOW (ref 8–23)
BUN: <5 mg/dL — ABNORMAL LOW (ref 8–23)
CO2: 14 mmol/L — ABNORMAL LOW (ref 22–32)
CO2: 18 mmol/L — ABNORMAL LOW (ref 22–32)
CO2: 20 mmol/L — ABNORMAL LOW (ref 22–32)
CO2: 20 mmol/L — ABNORMAL LOW (ref 22–32)
CO2: 21 mmol/L — ABNORMAL LOW (ref 22–32)
CO2: 22 mmol/L (ref 22–32)
Calcium: 7 mg/dL — ABNORMAL LOW (ref 8.9–10.3)
Calcium: 7.1 mg/dL — ABNORMAL LOW (ref 8.9–10.3)
Calcium: 7.1 mg/dL — ABNORMAL LOW (ref 8.9–10.3)
Calcium: 7.1 mg/dL — ABNORMAL LOW (ref 8.9–10.3)
Calcium: 7.2 mg/dL — ABNORMAL LOW (ref 8.9–10.3)
Calcium: 7.3 mg/dL — ABNORMAL LOW (ref 8.9–10.3)
Chloride: 101 mmol/L (ref 98–111)
Chloride: 103 mmol/L (ref 98–111)
Chloride: 103 mmol/L (ref 98–111)
Chloride: 103 mmol/L (ref 98–111)
Chloride: 104 mmol/L (ref 98–111)
Chloride: 107 mmol/L (ref 98–111)
Creatinine, Ser: 0.54 mg/dL — ABNORMAL LOW (ref 0.61–1.24)
Creatinine, Ser: 0.55 mg/dL — ABNORMAL LOW (ref 0.61–1.24)
Creatinine, Ser: 0.56 mg/dL — ABNORMAL LOW (ref 0.61–1.24)
Creatinine, Ser: 0.57 mg/dL — ABNORMAL LOW (ref 0.61–1.24)
Creatinine, Ser: 0.58 mg/dL — ABNORMAL LOW (ref 0.61–1.24)
Creatinine, Ser: 0.63 mg/dL (ref 0.61–1.24)
GFR calc Af Amer: >60 mL/min (ref >60)
GFR calc Af Amer: >60 mL/min (ref >60)
GFR calc Af Amer: >60 mL/min (ref >60)
GFR calc Af Amer: >60 mL/min (ref >60)
GFR calc Af Amer: >60 mL/min (ref >60)
GFR calc Af Amer: >60 mL/min (ref >60)
GFR calc non Af Amer: >60 mL/min (ref >60)
GFR calc non Af Amer: >60 mL/min (ref >60)
GFR calc non Af Amer: >60 mL/min (ref >60)
GFR calc non Af Amer: >60 mL/min (ref >60)
GFR calc non Af Amer: >60 mL/min (ref >60)
GFR calc non Af Amer: >60 mL/min (ref >60)
Glucose, Bld: 141 mg/dL — ABNORMAL HIGH (ref 70–99)
Glucose, Bld: 186 mg/dL — ABNORMAL HIGH (ref 70–99)
Glucose, Bld: 187 mg/dL — ABNORMAL HIGH (ref 70–99)
Glucose, Bld: 191 mg/dL — ABNORMAL HIGH (ref 70–99)
Glucose, Bld: 211 mg/dL — ABNORMAL HIGH (ref 70–99)
Glucose, Bld: 216 mg/dL — ABNORMAL HIGH (ref 70–99)
Potassium: 3.3 mmol/L — ABNORMAL LOW (ref 3.5–5.1)
Potassium: 3.6 mmol/L (ref 3.5–5.1)
Potassium: 3.6 mmol/L (ref 3.5–5.1)
Potassium: 3.7 mmol/L (ref 3.5–5.1)
Potassium: 3.9 mmol/L (ref 3.5–5.1)
Potassium: 4.4 mmol/L (ref 3.5–5.1)
Sodium: 129 mmol/L — ABNORMAL LOW (ref 135–145)
Sodium: 132 mmol/L — ABNORMAL LOW (ref 135–145)
Sodium: 133 mmol/L — ABNORMAL LOW (ref 135–145)
Sodium: 133 mmol/L — ABNORMAL LOW (ref 135–145)
Sodium: 134 mmol/L — ABNORMAL LOW (ref 135–145)
Sodium: 135 mmol/L (ref 135–145)

## 2019-01-11 LAB — GLUCOSE, CAPILLARY
Glucose-Capillary: 110 mg/dL — ABNORMAL HIGH (ref 70–99)
Glucose-Capillary: 116 mg/dL — ABNORMAL HIGH (ref 70–99)
Glucose-Capillary: 129 mg/dL — ABNORMAL HIGH (ref 70–99)
Glucose-Capillary: 130 mg/dL — ABNORMAL HIGH (ref 70–99)
Glucose-Capillary: 131 mg/dL — ABNORMAL HIGH (ref 70–99)
Glucose-Capillary: 154 mg/dL — ABNORMAL HIGH (ref 70–99)
Glucose-Capillary: 155 mg/dL — ABNORMAL HIGH (ref 70–99)
Glucose-Capillary: 161 mg/dL — ABNORMAL HIGH (ref 70–99)
Glucose-Capillary: 164 mg/dL — ABNORMAL HIGH (ref 70–99)
Glucose-Capillary: 167 mg/dL — ABNORMAL HIGH (ref 70–99)
Glucose-Capillary: 171 mg/dL — ABNORMAL HIGH (ref 70–99)
Glucose-Capillary: 174 mg/dL — ABNORMAL HIGH (ref 70–99)
Glucose-Capillary: 175 mg/dL — ABNORMAL HIGH (ref 70–99)
Glucose-Capillary: 188 mg/dL — ABNORMAL HIGH (ref 70–99)
Glucose-Capillary: 191 mg/dL — ABNORMAL HIGH (ref 70–99)
Glucose-Capillary: 194 mg/dL — ABNORMAL HIGH (ref 70–99)
Glucose-Capillary: 196 mg/dL — ABNORMAL HIGH (ref 70–99)
Glucose-Capillary: 198 mg/dL — ABNORMAL HIGH (ref 70–99)
Glucose-Capillary: 201 mg/dL — ABNORMAL HIGH (ref 70–99)
Glucose-Capillary: 202 mg/dL — ABNORMAL HIGH (ref 70–99)
Glucose-Capillary: 203 mg/dL — ABNORMAL HIGH (ref 70–99)

## 2019-01-11 LAB — BLOOD GAS, ARTERIAL
Acid-base deficit: 0.7 mmol/L (ref 0.0–2.0)
Bicarbonate: 22.9 mmol/L (ref 20.0–28.0)
Drawn by: 331001
O2 Content: 2 L/min
O2 Saturation: 96.4 %
Patient temperature: 101
pCO2 arterial: 36.4 mmHg (ref 32.0–48.0)
pH, Arterial: 7.421 (ref 7.350–7.450)
pO2, Arterial: 92.3 mmHg (ref 83.0–108.0)

## 2019-01-11 LAB — CBC
HCT: 32.3 % — ABNORMAL LOW (ref 39.0–52.0)
Hemoglobin: 11.6 g/dL — ABNORMAL LOW (ref 13.0–17.0)
MCH: 33.2 pg (ref 26.0–34.0)
MCHC: 35.9 g/dL (ref 30.0–36.0)
MCV: 92.6 fL (ref 80.0–100.0)
Platelets: 166 10*3/uL (ref 150–400)
RBC: 3.49 MIL/uL — ABNORMAL LOW (ref 4.22–5.81)
RDW: 11.9 % (ref 11.5–15.5)
WBC: 11.2 10*3/uL — ABNORMAL HIGH (ref 4.0–10.5)
nRBC: 0 % (ref 0.0–0.2)

## 2019-01-11 LAB — PHOSPHORUS: Phosphorus: 2.6 mg/dL (ref 2.5–4.6)

## 2019-01-11 LAB — C DIFFICILE QUICK SCREEN W PCR REFLEX
C Diff antigen: NEGATIVE
C Diff interpretation: NOT DETECTED
C Diff toxin: NEGATIVE

## 2019-01-11 LAB — URINE CULTURE: Culture: NO GROWTH

## 2019-01-11 LAB — LACTIC ACID, PLASMA: Lactic Acid, Venous: 1.4 mmol/L (ref 0.5–1.9)

## 2019-01-11 LAB — MAGNESIUM: Magnesium: 1.8 mg/dL (ref 1.7–2.4)

## 2019-01-11 MED ORDER — ETOMIDATE 2 MG/ML IV SOLN
30.0000 mg | Freq: Once | INTRAVENOUS | Status: AC
  Administered 2019-01-11: 30 mg via INTRAVENOUS

## 2019-01-11 MED ORDER — FENTANYL CITRATE (PF) 100 MCG/2ML IJ SOLN
INTRAMUSCULAR | Status: AC
  Administered 2019-01-11: 100 ug via INTRAVENOUS
  Filled 2019-01-11: qty 2

## 2019-01-11 MED ORDER — POTASSIUM CHLORIDE 10 MEQ/100ML IV SOLN
10.0000 meq | INTRAVENOUS | Status: AC
  Administered 2019-01-11 (×4): 10 meq via INTRAVENOUS
  Filled 2019-01-11 (×4): qty 100

## 2019-01-11 MED ORDER — FENTANYL CITRATE (PF) 100 MCG/2ML IJ SOLN
100.0000 ug | Freq: Once | INTRAMUSCULAR | Status: AC
  Administered 2019-01-11: 100 ug via INTRAVENOUS

## 2019-01-11 MED ORDER — VITAMIN B-1 100 MG PO TABS
100.0000 mg | ORAL_TABLET | Freq: Every day | ORAL | Status: DC
  Administered 2019-01-14 – 2019-01-21 (×6): 100 mg via ORAL
  Filled 2019-01-11 (×6): qty 1

## 2019-01-11 MED ORDER — ALBUTEROL SULFATE (2.5 MG/3ML) 0.083% IN NEBU
2.5000 mg | INHALATION_SOLUTION | Freq: Once | RESPIRATORY_TRACT | Status: AC | PRN
  Administered 2019-01-11: 2.5 mg via RESPIRATORY_TRACT
  Filled 2019-01-11: qty 3

## 2019-01-11 MED ORDER — ROCURONIUM BROMIDE 50 MG/5ML IV SOLN
100.0000 mg | Freq: Once | INTRAVENOUS | Status: AC
  Administered 2019-01-11: 100 mg via INTRAVENOUS

## 2019-01-11 MED ORDER — NOREPINEPHRINE 4 MG/250ML-% IV SOLN
INTRAVENOUS | Status: AC
  Filled 2019-01-11: qty 250

## 2019-01-11 MED ORDER — FOLIC ACID 1 MG PO TABS
1.0000 mg | ORAL_TABLET | Freq: Every day | ORAL | Status: DC
  Administered 2019-01-14 – 2019-01-21 (×6): 1 mg via ORAL
  Filled 2019-01-11 (×6): qty 1

## 2019-01-11 MED ORDER — POTASSIUM CHLORIDE 10 MEQ/100ML IV SOLN
10.0000 meq | INTRAVENOUS | Status: AC
  Administered 2019-01-11 (×2): 10 meq via INTRAVENOUS
  Filled 2019-01-11 (×2): qty 100

## 2019-01-11 MED ORDER — MIDAZOLAM HCL 2 MG/2ML IJ SOLN
INTRAMUSCULAR | Status: AC
  Filled 2019-01-11: qty 2

## 2019-01-11 MED ORDER — FENTANYL 2500MCG IN NS 250ML (10MCG/ML) PREMIX INFUSION
0.0000 ug/h | INTRAVENOUS | Status: DC
  Administered 2019-01-12: 50 ug/h via INTRAVENOUS
  Administered 2019-01-13: 175 ug/h via INTRAVENOUS
  Administered 2019-01-13: 03:00:00 150 ug/h via INTRAVENOUS
  Administered 2019-01-14: 200 ug/h via INTRAVENOUS
  Administered 2019-01-14: 05:00:00 100 ug/h via INTRAVENOUS
  Administered 2019-01-15: 50 ug/h via INTRAVENOUS
  Filled 2019-01-11 (×6): qty 250

## 2019-01-11 NOTE — Significant Event (Addendum)
Rapid Response Event Note  Overview: Called due to decreased LOC and tachypnea. Time Called: 2016 Arrival Time: 2018 Event Type: Neurologic, Respiratory  Initial Focused Assessment: PT laying in bed with eyes closed, labored breathing, snoring respirations. PT will arouse to sternal rub, will not follow commands, or speak, pupils 3 and sluggish. Lungs expiratory wheezes and rhonchus, pt with minimal gag reflex ? Whether protecting  airways. Skin hot to touch, pt with wound to spine with purulent drainage per RN. Temp- 101 Ax, HR 120 ST, BP 118/83, RR 41, SpO2 100% on 2L .   Interventions: pulled up in bed ABG- 7.42/36.4/92.3/22.9 Lactic Acid 1.4 CXR- Worsening multifocal airspace opacities Albuterol treatment MD Winters to bedside to assess pt. PCCM consulted  Plan of Care (if not transferred): Await PCCM consult, continue to monitor patient, call RRT if needed.  Event Summary: Name of Physician Notified: MD Gilford Rile at (PTA RRT)    at          [facility]

## 2019-01-11 NOTE — Progress Notes (Signed)
Subjective:  No acute events overnight.  Patient is noted to be somnolent but arousable to voice.  Follows simple commands.  Objective:  Vital signs in last 24 hours: Vitals:   01/11/19 0400 01/11/19 0459 01/11/19 0500 01/11/19 0516  BP: 127/82   (!) 148/89  Pulse: (!) 115 (!) 109  (!) 112  Resp: (!) 35 (!) 32  (!) 26  Temp: 97.8 F (36.6 C)     TempSrc: Oral     SpO2: 100% 99%  99%  Weight:   90 kg   Height:       General: Chronically ill-appearing male, diaphoretic and tremulous CV: Tachycardic without murmurs Pulm: Tachypneic, with decreased sounds on right lower lobe, on 2L  Abd: Soft and nontender, normoactive bowel sounds Ext: Warm and well-perfused without edema MSK: There is swelling of the left upper extremity by the elbow associated with a purpuric/vasculitis type rash (please see picture in media tab)  Assessment/Plan:  Principal Problem:   MSSA bacteremia Active Problems:   DKA (diabetic ketoacidoses) ([facility])   Alcohol withdrawal ([facility])   Aspiration pneumonia ([facility])   Sepsis ([facility])  # Sepsis secondary to MSSA bacteremia: Remains febrile though fever curve trending down.  Tachycardic and tachypneic in the setting of alcohol withdrawal but overall clinical status is similar to yesterday.  Repeat cultures are negative at 48 hours.  We will continue IV Ancef.  Ordered PICC line for IV antibiotics and to facilitate multiple infusions that he is requiring at this time but ID recommended against it.  May need TEE once patient is more alert.  # Aspiration pneumonia: Remains tachypneic and now on 2 L.  Appears to be protecting his airway at this time we will continue to monitor closely the setting of alcohol withdrawal.  On meropenem. Will reach out to pharmacy to narrow antibiotics.   # Alcohol withdrawal  # Alcohol use disorder  Received 20 mg of Ativan and 2 doses of Librium 25 mg in the past 24 hours.  Continues to be tremulous, diaphoretic, tachycardic, and tachypneic  on exam.  We will continue to monitor closely. No seizure activity during this admission.   # LEU swelling: Ultrasound of this extremity revealed significant subcutaneous edema and mild ascites but no effusions of the left elbow joint.  X-ray ordered today was negative.  Swelling remains similar to yesterday, but of unclear etiology.  It is also associated with a purpuric rash.  Not consistent with cellulitis.  Will not order inflammatory markers as he is bacteremic and if high will be inconclusive.  Ideally would evaluate for further imaging with CT or MRI but patient unable to tolerate this due to altered mentation.  Left upper extremity IV has been removed.  Ordered neurovascular checks every 4 hours to ensure patient is not developing signs of compartment syndrome.  # Watery diarrhea: RN reported 10 watery bowel movements this morning.  Unfortunately patient is altered and unable to tell me if he is experiencing any abdominal discomfort.  He is febrile, but has 2 sources of infection that could explain this. He is also on two different antibiotics that can cause diarrhea. Low suspicion for C. Diff but unable to rule out. Ordered C.diff. Will follow.     # DKA, resolved,  # T2DM Remains on insulin gtt. Unable to transition to SQ insulin due to poor mentation and inability to tolerate PO intake. Will continue BMP q4h and replete electrolytes as needed.   Dispo: Pending improvement in mental status  and resolution of alcohol withdrawal.   Welford Roche, MD 01/11/2019, 7:49 AM Pager: [phone]

## 2019-01-11 NOTE — Progress Notes (Addendum)
Reported to Dr. [name], I have spoken to [place], pt's sister.  1. pts sister would like a call today.  2. Pt's increased need for oxygen.  3.  Pt's increased swelling and redness in left arm

## 2019-01-11 NOTE — Progress Notes (Addendum)
md was notified of changes noted in wound on back of neck/shoulder area

## 2019-01-11 NOTE — Progress Notes (Signed)
Wound appears like a tunnel, depth not measured.  Periwound around core where it is draining is approx 7cm in diameter and bright, hot, dark red.  There is a hard area beneath the redness that measures 12x9 cm and feels thick.

## 2019-01-11 NOTE — Progress Notes (Signed)
Called patient's sister at home x2. Her husband picked up. Left message with him. I also called her cellphone x1. No answer.

## 2019-01-11 NOTE — Progress Notes (Addendum)
Patient seen bedside for altered mental status. He was responsive to verbal and tactile stimuli, but unable to answer any questions. He had minimal protection of his airway, with a delayed gag reflex. He was tachycardic,with no murmurs, rubs, or gallops. Warm to the touch with several open wounds, and a back wound with puss. His bowel sounds were present, with no guarding, or tenderness to palpation. His last dose of ativan was at 4:38 pm (01/11/2019). His lactic acid levels are within normal limits, as was his ABG at bedside.  Vitals bedside:  128/85, SPO2 100, RR 31, Temp: 101.0 F  Assessment:  Frederick Moody is a 63 y/o male that was seen at bedside for altered mental status. His differential includes alcohol withdrawal and possible new onset aspiration pneumonia, due to minimal guarding of his airway. Because his ABG and lactic acid levels are within acceptable ranges, this lowers the case for pneumonia, or poor circulation.   Plan:  -Chest Xray: Worsening patchy bilateral airspace opacities. - ABG ordered, results within normal range.  - Ativan PRN - Ordered BNP.  ADDENDUM:  Consulted Pulmonary/Critical Care, due to worsening chest Xray and RR 34-39. After consultation with Dr. Orpah Melter, Mr. Wehner was transferred to the ICU for possible intubation.

## 2019-01-11 NOTE — Progress Notes (Signed)
Spoke to Dr. Tommy Medal concerning PICC placement. Dr. Darci Current note from 7/17 indicated that repeat blood cultures were drawn. They are negative to date, but considering the PICC is for long term antibiotics and the patient has adequate IV access at this time, Dr. Tommy Medal advised to hold off for now to place PICC. There is no emergent need for the PICC at this time.

## 2019-01-11 NOTE — Progress Notes (Addendum)
NAME:  Frederick SizerRobert Baine, MRN:  [id number], DOB:  28-Jun-1955, LOS: 4 ADMISSION DATE:  01/07/2019, CONSULTATION DATE:  01/11/19  CHIEF COMPLAINT:  Encephalopathy  Brief History   This is a 63 yo with history of uncontrolled DM and etoh abuse who presents for weakness and falls. Found to have etoh withdrawal and DKA and MSSA bacteremia.   History of present illness   This is a [age] yo with history of uncontrolled DM and etoh abuse who presents for weakness and falls. Found to have etoh withdrawal and DKA and MSSA bacteremia. His symptoms on admission were weakness and falls. Had fever and tachycardia in the ED. Pertinent lavs included WBC count of 24.5, AG 21, Glucose 684, Ketones 20. Since admission patient has been noted to have MSSA bacteremia for which patient has been on cefepime for one day. Transitioned to ancef for 2 days and than switched meropenem on 7/18 and 7/19. Has been treated for DKA with insulin drip. Of note the gap has been closed for 3 consecutive blood draws. And sugars have been well controlled.   Past Medical History  DM Etoh abuse  Significant Hospital Events   MSSA positive on 01/07/19 Intubated and transferred to MICU on 7/19  Consults:  PCCM on 01/11/19  Procedures:  Intubation 7/19  Significant Diagnostic Tests:  CT head and chest pending TTE on 7/17 notes    1. The left ventricle has normal systolic function, with an ejection fraction of 55-60%. The cavity size was normal. Left ventricular diastolic Doppler parameters are consistent with pseudonormalization. Elevated mean left atrial pressure No evidence of  left ventricular regional wall motion abnormalities.  2. The right ventricle has normal systolic function. The cavity was normal. There is no increase in right ventricular wall thickness. Right ventricular systolic pressure is normal with an estimated pressure of 48.4 mmHg.  3. Left atrial size was mildly dilated.  4. There is mild mitral annular  calcification present. The MR jet is centrally-directed.  5. Mild thickening of the aortic valve. Mild calcification of the aortic valve. No stenosis of the aortic valve.  6. The aortic root and ascending aorta are normal in size and structure.   Micro Data:  7/15 cultures positive for MSSA  Antimicrobials:  7/15-vanc and cefepime 7/16- CTX and ancef 7/17-ancef 7/18-Meropenem 7/19-Meropenem Interim history/subjective:  NA  Objective   Blood pressure (!) 117/92, pulse (!) 130, temperature 100.3 F (37.9 C), temperature source Axillary, resp. rate (!) 35, height 5\' 9"  (1.753 m), weight 90 kg, SpO2 100 %.    Vent Mode: PRVC FiO2 (%):  [100 %] 100 % Set Rate:  [16 bmp] 16 bmp Vt Set:  [510 mL] 510 mL PEEP:  [5 cmH20] 5 cmH20 Plateau Pressure:  [15 cmH20] 15 cmH20   Intake/Output Summary (Last 24 hours) at 01/11/2019 2345 Last data filed at 01/11/2019 1630 Gross per 24 hour  Intake 2972.25 ml  Output 2150 ml  Net 822.25 ml   Filed Weights   01/09/19 0500 01/10/19 0500 01/11/19 0500  Weight: 89.4 kg 89.3 kg 90 kg    Examination: General: Patient sedated and somnolent HENT: Mouth open and dry Mucous membranes noted Lungs: CTAB Cardiovascular: RRR. Good pulses Abdomen: Soft non tender non distended Extremities: without notable deformity Neuro: GCS-8 GU:  Skin. Patient with ulceration on upper T spine with significant purulence. Concern for deep tissue infection  Resolved Hospital Problem list   DKA  Assessment & Plan:  This is a 63 year old with  history as noted above who is transferred to MICU for alteration in mental status and failure to protect airway  Encephalopathy-Likely from over administration of benzos and sepsis. Required 16 mg of Ativan and 50 mg of librium.  -Hold benzos for now -If showing signs of agitation can use propofol for sedation -Does have fentanyl drip for now  Sepsis-Likely 2/2 MSSA bacteremia. Patient with fever Nidus for this is likely  the wound on back vs endocarditis. Patient continue to be febrile despite continued abx. Concern would be poor source control. Has not progressed to shock and this does not seem to have worsened necessarily but is not responding to abx as he should -Repeat CX's. Lactic acid CBC -LR bolus -CT spine CT head and CT chest -Continue Merropenem will add vanc as there are some marginal pressures and patient no with A fib and RVR   DM-No longer in DKA will transition to basal bolus regimen. Daily dose over last 24 hours appx 64 -Will start with 20 of levemir -ISS Q4 hours   Etoh withdrawal-patient had been requiring significant amounts of benzos to control this on the floor. Over 24 hours patient required 16 mg of Ativan and 50 mg of librium. -Will monitor for now. Gabaergic system with plenty of stimulation for now -If needs medicaiton will consider propfol and restarting librium   Back wound-Patient with back wound that has significant purulence that continues to drain. [place] that there is deep space infection and this could be culrpit of sepsis -Attain CT t spine -If this shows deep space infection will consult surgery    MSSA bacteremia-Patient with cx's positive for this on admission.  -TEE when condition stabilized -CT chest and Ct head for possible septic emboli  Aspiration-Patient with noted gastic contents on epiglottis that was dried during intubation. Patient signs of aspiration during intubation.  -Will need swallow study after extubation.   Atrial fibrillation-Likely 2/2 to patients illness -No need for [facility] for now as this from critical illness -Will start amio as patients pressures marginal     Best practice:  Diet: NA Pain/Anxiety/Delirium protocol (if indicated): Fentanyl for now VAP protocol (if indicated): 30 degrees HOB.  DVT prophylaxis: Lovenox for now GI prophylaxis: Pepcid Glucose control: Q4 SS and levemir Mobility: NA Code Status: Full Family  Communication: Will discuss with family Disposition: ICU  Labs   CBC: Recent Labs  Lab 01/07/19 1702 01/07/19 1807 01/08/19 0228 01/09/19 0407 01/10/19 0433 01/11/19 0348  WBC 24.5*  --  18.9* 13.3* 10.6* 11.2*  NEUTROABS 23.5*  --   --   --   --   --   HGB 14.5 15.0 12.1* 11.7* 11.5* 11.6*  HCT 41.0 44.0 34.1* 33.0* 32.6* 32.3*  MCV 93.8  --  93.2 92.4 92.4 92.6  PLT 277  --  207 156 129* 284    Basic Metabolic Panel: Recent Labs  Lab 01/07/19 2028  01/08/19 1401  01/09/19 0407  01/10/19 0433  01/11/19 0348 01/11/19 1025 01/11/19 1121 01/11/19 1611 01/11/19 2041  NA 127*   < >  --    < > 130*   < > 130*   < > 134* 132* 133* 135 133*  K 4.4   < >  --    < > 3.6   < > 3.6   < > 3.3* 4.4 3.9 3.6 3.6  CL 95*   < >  --    < > 100   < > 102   < >  103 104 103 107 103  CO2 16*   < >  --    < > 22   < > 21*   < > 22 14* 18* 20* 21*  GLUCOSE 456*   < >  --    < > 154*   < > 163*   < > 186* 191* 211* 141* 216*  BUN 23   < >  --    < > 8   < > 7*   < > 6* 5* <5* 6* <5*  CREATININE 1.05   < >  --    < > 0.55*   < > 0.52*   < > 0.54* 0.56* 0.58* 0.57* 0.55*  CALCIUM 8.5*   < >  --    < > 7.7*   < > 7.2*   < > 7.3* 7.1* 7.1* 7.2* 7.0*  MG 1.7  --  1.6*  --  1.8  --  1.7  --  1.8  --   --   --   --   PHOS 3.4  --  1.7*  --  2.3*  --  2.6  --  2.6  --   --   --   --    < > = values in this interval not displayed.   GFR: Estimated Creatinine Clearance: 104.8 mL/min (A) (by C-G formula based on SCr of 0.55 mg/dL (L)). Recent Labs  Lab 01/07/19 1702 01/07/19 1902 01/08/19 0228 01/09/19 0407 01/10/19 0433 01/11/19 0348 01/11/19 2041  WBC 24.5*  --  18.9* 13.3* 10.6* 11.2*  --   LATICACIDVEN 3.3* 1.5  --   --   --   --  1.4    Liver Function Tests: Recent Labs  Lab 01/07/19 1702  AST 32  ALT 39  ALKPHOS 127*  BILITOT 1.3*  PROT 7.4  ALBUMIN 2.5*   No results for input(s): LIPASE, AMYLASE in the last 168 hours. No results for input(s): AMMONIA in the last 168 hours.   ABG    Component Value Date/Time   PHART 7.421 01/11/2019 2055   PCO2ART 36.4 01/11/2019 2055   PO2ART 92.3 01/11/2019 2055   HCO3 22.9 01/11/2019 2055   TCO2 22 01/07/2019 1807   ACIDBASEDEF 0.7 01/11/2019 2055   O2SAT 96.4 01/11/2019 2055     Coagulation Profile: No results for input(s): INR, PROTIME in the last 168 hours.  Cardiac Enzymes: Recent Labs  Lab 01/08/19 1401  CKTOTAL 120    HbA1C: Hgb A1c MFr Bld  Date/Time Value Ref Range Status  01/07/2019 08:28 PM 11.7 (H) 4.8 - 5.6 % Final    Comment:    (NOTE) Pre diabetes:          5.7%-6.4% Diabetes:              >6.4% Glycemic control for   <7.0% adults with diabetes     CBG: Recent Labs  Lab 01/11/19 1859 01/11/19 2006 01/11/19 2052 01/11/19 2202 01/11/19 2306  GLUCAP 175* 191* 202* 188* 174*    Review of Systems:   Pertinent positive and negative per HPI  Past Medical History  He,  has a past medical history of Diabetes mellitus without complication (HCC) and ETOH abuse.   Surgical History    Past Surgical History:  Procedure Laterality Date  . APPENDECTOMY       Social History   reports that he has never smoked. He has never used smokeless tobacco. He reports previous alcohol use. He reports that  he does not use drugs.   Family History   His family history is not on file.   Allergies Allergies  Allergen Reactions  . Penicillins Swelling and Rash    Neck became swollen Did it involve swelling of the face/tongue/throat, SOB, or low BP? Yes Did it involve sudden or severe rash/hives, skin peeling, or any reaction on the inside of your mouth or nose? Unk Did you need to seek medical attention at a hospital or doctor's office? Unk When did it last happen? Happened in the 1980's If all above answers are "NO", may proceed with cephalosporin use.      Home Medications  Prior to Admission medications   Medication Sig Start Date End Date Taking? Authorizing Provider  naproxen sodium  (ALEVE) 220 MG tablet Take 220-440 mg by mouth 2 (two) times daily as needed (for pain).   Yes [provider]  metFORMIN (GLUCOPHAGE) 500 MG tablet Take 1 tablet (500 mg total) by mouth 2 (two) times daily with a meal. Patient not taking: Reported on 01/07/2019 02/19/18   Geoffery Lyonselo, [place], MD     Critical care time: 90 minutes

## 2019-01-11 NOTE — Progress Notes (Signed)
Rapid response notified at 2016, spoke to [employer].  Patient re-assessed at 81.  Rapid response notified for decreased LOC, increasing tachypnea and worsening mental status since start of shift at 1900. Patient unable to verbalize name when prompted, poor trunk strength noted.  Patient unable to follow instructions regarding swallowing and coughing. Patient's skin was diaphoretic and hot to the touch, axillary temperature 101. V/S 101, 117/77, 120, R36. Lung fields diminished, particularly on right side.  Constricted breath sounds noted. Abdomen was firm and distended, rectal tube and foley catheter in place, both patent and draining.  Worsening edema noted in bilateral extremities and lower extremities. Oncall MD Intern DR.Winters and Dr.Lee paged, and aware of situation.  MD also en route.

## 2019-01-11 NOTE — Care Plan (Signed)
Have assessed patient. Minimal arousal to sternal rub. Sonorous breathing. Tachypneic. CXR with possible increase airspace disease on right. Patient likely with acute worsening of sepsis and likely benzo overdose. Also could have septic emboli from MSSA if there is any element of endocarditis. Pateint to be transferred to MICU with likely need for intubation. Will attain stat head CT and chest CT once airway stabilized.TRanfer order placed and charge nurse notified.  Newell Coral DO Internal Medicine/Pediatrics Pulmonary and Critical Care Fellow PGY-6

## 2019-01-12 ENCOUNTER — Inpatient Hospital Stay (HOSPITAL_COMMUNITY): Payer: Medicaid Other

## 2019-01-12 ENCOUNTER — Encounter (HOSPITAL_COMMUNITY): Payer: Self-pay | Admitting: Cardiology

## 2019-01-12 DIAGNOSIS — L0291 Cutaneous abscess, unspecified: Secondary | ICD-10-CM

## 2019-01-12 DIAGNOSIS — E440 Moderate protein-calorie malnutrition: Secondary | ICD-10-CM | POA: Insufficient documentation

## 2019-01-12 DIAGNOSIS — J9600 Acute respiratory failure, unspecified whether with hypoxia or hypercapnia: Secondary | ICD-10-CM

## 2019-01-12 DIAGNOSIS — R579 Shock, unspecified: Secondary | ICD-10-CM

## 2019-01-12 DIAGNOSIS — Z9911 Dependence on respirator [ventilator] status: Secondary | ICD-10-CM

## 2019-01-12 DIAGNOSIS — R4182 Altered mental status, unspecified: Secondary | ICD-10-CM

## 2019-01-12 DIAGNOSIS — G9340 Encephalopathy, unspecified: Secondary | ICD-10-CM

## 2019-01-12 LAB — POCT I-STAT 7, (LYTES, BLD GAS, ICA,H+H)
Acid-base deficit: 1 mmol/L (ref 0.0–2.0)
Bicarbonate: 23.7 mmol/L (ref 20.0–28.0)
Calcium, Ion: 1.11 mmol/L — ABNORMAL LOW (ref 1.15–1.40)
HCT: 30 % — ABNORMAL LOW (ref 39.0–52.0)
Hemoglobin: 10.2 g/dL — ABNORMAL LOW (ref 13.0–17.0)
O2 Saturation: 100 %
Patient temperature: 99.2
Potassium: 3.2 mmol/L — ABNORMAL LOW (ref 3.5–5.1)
Sodium: 139 mmol/L (ref 135–145)
TCO2: 25 mmol/L (ref 22–32)
pCO2 arterial: 37.9 mmHg (ref 32.0–48.0)
pH, Arterial: 7.406 (ref 7.350–7.450)
pO2, Arterial: 207 mmHg — ABNORMAL HIGH (ref 83.0–108.0)

## 2019-01-12 LAB — BASIC METABOLIC PANEL
Anion gap: 9 (ref 5–15)
Anion gap: 7 (ref 5–15)
BUN: <5 mg/dL — ABNORMAL LOW (ref 8–23)
BUN: 7 mg/dL — ABNORMAL LOW (ref 8–23)
CO2: 20 mmol/L — ABNORMAL LOW (ref 22–32)
CO2: 20 mmol/L — ABNORMAL LOW (ref 22–32)
Calcium: 6.7 mg/dL — ABNORMAL LOW (ref 8.9–10.3)
Calcium: 6.8 mg/dL — ABNORMAL LOW (ref 8.9–10.3)
Chloride: 105 mmol/L (ref 98–111)
Chloride: 109 mmol/L (ref 98–111)
Creatinine, Ser: 0.63 mg/dL (ref 0.61–1.24)
Creatinine, Ser: 0.8 mg/dL (ref 0.61–1.24)
GFR calc Af Amer: >60 mL/min (ref >60)
GFR calc Af Amer: >60 mL/min (ref >60)
GFR calc non Af Amer: >60 mL/min (ref >60)
GFR calc non Af Amer: >60 mL/min (ref >60)
Glucose, Bld: 169 mg/dL — ABNORMAL HIGH (ref 70–99)
Glucose, Bld: 112 mg/dL — ABNORMAL HIGH (ref 70–99)
Potassium: 4.4 mmol/L (ref 3.5–5.1)
Potassium: 3.6 mmol/L (ref 3.5–5.1)
Sodium: 134 mmol/L — ABNORMAL LOW (ref 135–145)
Sodium: 136 mmol/L (ref 135–145)

## 2019-01-12 LAB — COMPREHENSIVE METABOLIC PANEL
ALT: 34 U/L (ref 0–44)
AST: 73 U/L — ABNORMAL HIGH (ref 15–41)
Albumin: 1.3 g/dL — ABNORMAL LOW (ref 3.5–5.0)
Alkaline Phosphatase: 128 U/L — ABNORMAL HIGH (ref 38–126)
Anion gap: 8 (ref 5–15)
BUN: <5 mg/dL — ABNORMAL LOW (ref 8–23)
CO2: 21 mmol/L — ABNORMAL LOW (ref 22–32)
Calcium: 6.9 mg/dL — ABNORMAL LOW (ref 8.9–10.3)
Chloride: 106 mmol/L (ref 98–111)
Creatinine, Ser: 0.55 mg/dL — ABNORMAL LOW (ref 0.61–1.24)
GFR calc Af Amer: >60 mL/min (ref >60)
GFR calc non Af Amer: >60 mL/min (ref >60)
Glucose, Bld: 146 mg/dL — ABNORMAL HIGH (ref 70–99)
Potassium: 4.1 mmol/L (ref 3.5–5.1)
Sodium: 135 mmol/L (ref 135–145)
Total Bilirubin: 1.9 mg/dL — ABNORMAL HIGH (ref 0.3–1.2)
Total Protein: 5.5 g/dL — ABNORMAL LOW (ref 6.5–8.1)

## 2019-01-12 LAB — MAGNESIUM: Magnesium: 1.6 mg/dL — ABNORMAL LOW (ref 1.7–2.4)

## 2019-01-12 LAB — GLUCOSE, CAPILLARY
Glucose-Capillary: 108 mg/dL — ABNORMAL HIGH (ref 70–99)
Glucose-Capillary: 134 mg/dL — ABNORMAL HIGH (ref 70–99)
Glucose-Capillary: 137 mg/dL — ABNORMAL HIGH (ref 70–99)
Glucose-Capillary: 139 mg/dL — ABNORMAL HIGH (ref 70–99)
Glucose-Capillary: 150 mg/dL — ABNORMAL HIGH (ref 70–99)
Glucose-Capillary: 166 mg/dL — ABNORMAL HIGH (ref 70–99)
Glucose-Capillary: 172 mg/dL — ABNORMAL HIGH (ref 70–99)
Glucose-Capillary: 86 mg/dL (ref 70–99)
Glucose-Capillary: 91 mg/dL (ref 70–99)

## 2019-01-12 LAB — LACTIC ACID, PLASMA
Lactic Acid, Venous: 2.1 mmol/L (ref 0.5–1.9)
Lactic Acid, Venous: 2.9 mmol/L (ref 0.5–1.9)

## 2019-01-12 LAB — PHOSPHORUS: Phosphorus: 1.8 mg/dL — ABNORMAL LOW (ref 2.5–4.6)

## 2019-01-12 LAB — MRSA PCR SCREENING: MRSA by PCR: NEGATIVE

## 2019-01-12 MED ORDER — FENTANYL CITRATE (PF) 250 MCG/5ML IJ SOLN
INTRAMUSCULAR | Status: AC
  Filled 2019-01-12: qty 5

## 2019-01-12 MED ORDER — PHENYLEPHRINE HCL-NACL 10-0.9 MG/250ML-% IV SOLN
INTRAVENOUS | Status: AC
  Administered 2019-01-12: 20 ug/min via INTRAVENOUS
  Filled 2019-01-12: qty 250

## 2019-01-12 MED ORDER — SODIUM CHLORIDE 0.9 % IV BOLUS
1000.0000 mL | Freq: Once | INTRAVENOUS | Status: AC
  Administered 2019-01-12: 1000 mL via INTRAVENOUS

## 2019-01-12 MED ORDER — SODIUM CHLORIDE 0.9 % IV SOLN
3.0000 g | Freq: Four times a day (QID) | INTRAVENOUS | Status: AC
  Administered 2019-01-12 – 2019-01-18 (×25): 3 g via INTRAVENOUS
  Filled 2019-01-12 (×27): qty 8

## 2019-01-12 MED ORDER — MAGNESIUM SULFATE 2 GM/50ML IV SOLN
2.0000 g | Freq: Once | INTRAVENOUS | Status: AC
  Administered 2019-01-12: 2 g via INTRAVENOUS
  Filled 2019-01-12: qty 50

## 2019-01-12 MED ORDER — IOHEXOL 300 MG/ML  SOLN
75.0000 mL | Freq: Once | INTRAMUSCULAR | Status: AC | PRN
  Administered 2019-01-12: 75 mL via INTRAVENOUS

## 2019-01-12 MED ORDER — IBUPROFEN 100 MG/5ML PO SUSP
400.0000 mg | Freq: Four times a day (QID) | ORAL | Status: DC | PRN
  Administered 2019-01-12: 400 mg via ORAL
  Filled 2019-01-12: qty 20

## 2019-01-12 MED ORDER — POTASSIUM & SODIUM PHOSPHATES 280-160-250 MG PO PACK
2.0000 | PACK | Freq: Three times a day (TID) | ORAL | Status: AC
  Administered 2019-01-12 (×2): 2 via ORAL
  Filled 2019-01-12 (×2): qty 2

## 2019-01-12 MED ORDER — PHENYLEPHRINE 40 MCG/ML (10ML) SYRINGE FOR IV PUSH (FOR BLOOD PRESSURE SUPPORT)
PREFILLED_SYRINGE | INTRAVENOUS | Status: AC
  Filled 2019-01-12: qty 20

## 2019-01-12 MED ORDER — BUPIVACAINE-EPINEPHRINE (PF) 0.25% -1:200000 IJ SOLN: INTRAMUSCULAR | Status: DC | PRN

## 2019-01-12 MED ORDER — DEXMEDETOMIDINE HCL IN NACL 400 MCG/100ML IV SOLN
0.4000 ug/kg/h | INTRAVENOUS | Status: DC
  Administered 2019-01-12: 0.4 ug/kg/h via INTRAVENOUS
  Filled 2019-01-12: qty 100

## 2019-01-12 MED ORDER — PHENYLEPHRINE HCL-NACL 10-0.9 MG/250ML-% IV SOLN
INTRAVENOUS | Status: AC
  Filled 2019-01-12: qty 250

## 2019-01-12 MED ORDER — LACTATED RINGERS IV BOLUS
1000.0000 mL | Freq: Once | INTRAVENOUS | Status: AC
  Administered 2019-01-12: 1000 mL via INTRAVENOUS

## 2019-01-12 MED ORDER — PROPOFOL 10 MG/ML IV BOLUS
INTRAVENOUS | Status: AC
  Filled 2019-01-12: qty 20

## 2019-01-12 MED ORDER — PHENYLEPHRINE HCL-NACL 10-0.9 MG/250ML-% IV SOLN
0.0000 ug/min | INTRAVENOUS | Status: DC
  Administered 2019-01-12: 13:00:00 20 ug/min via INTRAVENOUS

## 2019-01-12 MED ORDER — PHENYLEPHRINE 40 MCG/ML (10ML) SYRINGE FOR IV PUSH (FOR BLOOD PRESSURE SUPPORT)
PREFILLED_SYRINGE | INTRAVENOUS | Status: AC
  Filled 2019-01-12: qty 30

## 2019-01-12 MED ORDER — VITAL HIGH PROTEIN PO LIQD: 1000.0000 mL | ORAL | Status: DC

## 2019-01-12 MED ORDER — PHENYLEPHRINE HCL-NACL 40-0.9 MG/250ML-% IV SOLN
25.0000 ug/min | INTRAVENOUS | Status: DC
  Administered 2019-01-13: 100 ug/min via INTRAVENOUS
  Administered 2019-01-13: 10:00:00 90 ug/min via INTRAVENOUS
  Administered 2019-01-14: 21:00:00 20 ug/min via INTRAVENOUS
  Administered 2019-01-14: 03:00:00 30 ug/min via INTRAVENOUS
  Filled 2019-01-12 (×4): qty 250

## 2019-01-12 MED ORDER — CHLORHEXIDINE GLUCONATE CLOTH 2 % EX PADS
6.0000 | MEDICATED_PAD | Freq: Every day | CUTANEOUS | Status: DC
  Administered 2019-01-12 – 2019-01-17 (×6): 6 via TOPICAL

## 2019-01-12 MED ORDER — INSULIN ASPART 100 UNIT/ML ~~LOC~~ SOLN
0.0000 [IU] | SUBCUTANEOUS | Status: DC
  Administered 2019-01-12 (×2): 3 [IU] via SUBCUTANEOUS
  Administered 2019-01-13: 2 [IU] via SUBCUTANEOUS
  Administered 2019-01-13: 5 [IU] via SUBCUTANEOUS
  Administered 2019-01-13: 3 [IU] via SUBCUTANEOUS
  Administered 2019-01-13: 2 [IU] via SUBCUTANEOUS
  Administered 2019-01-14: 5 [IU] via SUBCUTANEOUS
  Administered 2019-01-14: 21:00:00 8 [IU] via SUBCUTANEOUS
  Administered 2019-01-14 (×4): 5 [IU] via SUBCUTANEOUS
  Administered 2019-01-15: 20:00:00 2 [IU] via SUBCUTANEOUS
  Administered 2019-01-15: 04:00:00 3 [IU] via SUBCUTANEOUS
  Administered 2019-01-15 – 2019-01-16 (×3): 2 [IU] via SUBCUTANEOUS
  Administered 2019-01-16: 05:00:00 3 [IU] via SUBCUTANEOUS
  Administered 2019-01-16: 20:00:00 2 [IU] via SUBCUTANEOUS
  Administered 2019-01-16: 17:00:00 3 [IU] via SUBCUTANEOUS

## 2019-01-12 MED ORDER — VANCOMYCIN HCL 10 G IV SOLR
1500.0000 mg | Freq: Once | INTRAVENOUS | Status: AC
  Administered 2019-01-12: 1500 mg via INTRAVENOUS
  Filled 2019-01-12: qty 1500

## 2019-01-12 MED ORDER — CHLORHEXIDINE GLUCONATE 0.12% ORAL RINSE (MEDLINE KIT)
15.0000 mL | Freq: Two times a day (BID) | OROMUCOSAL | Status: DC
  Administered 2019-01-12 – 2019-01-19 (×15): 15 mL via OROMUCOSAL

## 2019-01-12 MED ORDER — 0.9 % SODIUM CHLORIDE (POUR BTL) OPTIME
TOPICAL | Status: DC | PRN
  Administered 2019-01-13: 1000 mL

## 2019-01-12 MED ORDER — SODIUM CHLORIDE 0.9 % IV SOLN
250.0000 mL | INTRAVENOUS | Status: DC
  Administered 2019-01-12 – 2019-01-18 (×4): 250 mL via INTRAVENOUS

## 2019-01-12 MED ORDER — VANCOMYCIN HCL 10 G IV SOLR
1250.0000 mg | Freq: Two times a day (BID) | INTRAVENOUS | Status: DC
  Administered 2019-01-12: 11:00:00 1250 mg via INTRAVENOUS
  Filled 2019-01-12 (×2): qty 1250

## 2019-01-12 MED ORDER — IBUPROFEN 100 MG/5ML PO SUSP
400.0000 mg | Freq: Four times a day (QID) | ORAL | Status: DC | PRN
  Administered 2019-01-17 – 2019-01-26 (×5): 400 mg
  Filled 2019-01-12 (×5): qty 20

## 2019-01-12 MED ORDER — FAMOTIDINE IN NACL 20-0.9 MG/50ML-% IV SOLN
20.0000 mg | INTRAVENOUS | Status: DC
  Administered 2019-01-12 – 2019-01-18 (×8): 20 mg via INTRAVENOUS
  Filled 2019-01-12 (×8): qty 50

## 2019-01-12 MED ORDER — PHENYLEPHRINE HCL-NACL 10-0.9 MG/250ML-% IV SOLN
25.0000 ug/min | INTRAVENOUS | Status: DC
  Administered 2019-01-12: 100 ug/min via INTRAVENOUS
  Administered 2019-01-12: 120 ug/min via INTRAVENOUS
  Administered 2019-01-12: 100 ug/min via INTRAVENOUS
  Administered 2019-01-12 (×2): 50 ug/min via INTRAVENOUS
  Filled 2019-01-12 (×3): qty 250

## 2019-01-12 MED ORDER — AMIODARONE HCL IN DEXTROSE 360-4.14 MG/200ML-% IV SOLN
30.0000 mg/h | INTRAVENOUS | Status: DC
  Administered 2019-01-12 – 2019-01-13 (×2): 30 mg/h via INTRAVENOUS
  Filled 2019-01-12 (×9): qty 200

## 2019-01-12 MED ORDER — ROCURONIUM BROMIDE 10 MG/ML (PF) SYRINGE
PREFILLED_SYRINGE | INTRAVENOUS | Status: AC
  Filled 2019-01-12: qty 20

## 2019-01-12 MED ORDER — SODIUM CHLORIDE 0.9 % IV SOLN: INTRAVENOUS | Status: DC | PRN

## 2019-01-12 MED ORDER — INSULIN DETEMIR 100 UNIT/ML ~~LOC~~ SOLN
20.0000 [IU] | Freq: Every day | SUBCUTANEOUS | Status: DC
  Administered 2019-01-12 – 2019-01-19 (×7): 20 [IU] via SUBCUTANEOUS
  Filled 2019-01-12 (×10): qty 0.2

## 2019-01-12 MED ORDER — ORAL CARE MOUTH RINSE
15.0000 mL | OROMUCOSAL | Status: DC
  Administered 2019-01-12 – 2019-01-17 (×45): 15 mL via OROMUCOSAL

## 2019-01-12 MED ORDER — ENOXAPARIN SODIUM 40 MG/0.4ML ~~LOC~~ SOLN: 40.0000 mg | SUBCUTANEOUS | Status: DC

## 2019-01-12 MED ORDER — BUPIVACAINE-EPINEPHRINE (PF) 0.25% -1:200000 IJ SOLN
INTRAMUSCULAR | Status: AC
  Filled 2019-01-12: qty 30

## 2019-01-12 MED ORDER — LACTATED RINGERS IV SOLN
INTRAVENOUS | Status: DC
  Administered 2019-01-12 – 2019-01-16 (×5): via INTRAVENOUS

## 2019-01-12 MED ORDER — VITAL AF 1.2 CAL PO LIQD
1000.0000 mL | ORAL | Status: DC
  Administered 2019-01-12: 1000 mL

## 2019-01-12 MED ORDER — LORAZEPAM 2 MG/ML IJ SOLN
1.0000 mg | INTRAMUSCULAR | Status: DC | PRN
  Administered 2019-01-12 – 2019-01-19 (×23): 2 mg via INTRAVENOUS
  Filled 2019-01-12 (×25): qty 1

## 2019-01-12 MED ORDER — AMIODARONE HCL IN DEXTROSE 360-4.14 MG/200ML-% IV SOLN
60.0000 mg/h | INTRAVENOUS | Status: DC
  Administered 2019-01-12 (×2): 60 mg/h via INTRAVENOUS
  Filled 2019-01-12 (×2): qty 200

## 2019-01-12 MED ORDER — AMIODARONE LOAD VIA INFUSION
150.0000 mg | Freq: Once | INTRAVENOUS | Status: AC
  Administered 2019-01-12: 150 mg via INTRAVENOUS
  Filled 2019-01-12: qty 83.34

## 2019-01-12 MED ORDER — FENTANYL BOLUS VIA INFUSION
50.0000 ug | INTRAVENOUS | Status: DC | PRN
  Administered 2019-01-12 – 2019-01-16 (×12): 50 ug via INTRAVENOUS
  Filled 2019-01-12: qty 50

## 2019-01-12 NOTE — Progress Notes (Signed)
Dr. Elsworth Soho made aware of the following information:  -Pts foley was discontinued at 1050 and condom catheter placed. Pt has not made any urine since then. Pt was bladder scanned with 33cc as the result. No foley to be reinserted at this time.  -CVP was set up per MD request and the result was 5. MD made aware and LR at 75 was started per MD.  -Pts 1600 temperature was 103.8. No new orders given.  -MD would still like tube feeding started despite pt needing to be NPO after midnight for procedure tomorrow 7/21.

## 2019-01-12 NOTE — Progress Notes (Signed)
63 year old diabetic with a EtOH abuse admitted 7/15 with DKA, EtOH withdrawal and MSSA bacteremia.  He was treated with Ancef, received several doses of Ativan and eventually required intubation 7/19  On exam-critically ill, intubated, soft blood pressure, unresponsive on fentanyl drip, RA SS -2, 2+ edema especially both arms and feet, soft nontender abdomen, S1-S2 irregular tacky on monitor Erythematous slightly indurated area upper back interscapular with purulent drainage    Labs show mild hyponatremia, anion gap is resolved, hypomagnesemia and hypophosphatemia  X-ray 7/19 personally reviewed which shows bilateral multifocal airspace disease and small effusions. CT head/chest and thoracic spine was reviewed.  Impression/plan Acute encephalopathy-seems to have been related to excessive benzos given for alcohol withdrawal -Goal RA SS 0 to -1, will transition off fentanyl drip and use low-dose Precedex if blood pressure tolerates  MSSA bacteremia-TTE was negative, source likely cellulitis and abscess on upper back, will have surgery for I&D at bedside Best to obtain TEE while he is intubated Hold off PICC placement until abscess drained Antibiotics have been broadened from Ancef to meropenem and Vanco for aspiration pneumonia, if respiratory culture negative can narrow back down to Ancef  Atrial fibrillation RVR-resolved, continue amiodarone for another 24 hours and then discontinue  DKA-resolved, transition off insulin drip with 20 units of Levemir  Hypomagnesemia and hypophosphatemia will be repleted  The patient is critically ill with multiple organ systems failure and requires high complexity decision making for assessment and support, frequent evaluation and titration of therapies, application of advanced monitoring technologies and extensive interpretation of multiple databases. Critical Care Time devoted to patient care services described in this note independent of APP/resident   time is 35 minutes.   Kara Mead MD. Shade Flood. [place] [facility] Pager [phone] If no response call [phone]   01/12/2019

## 2019-01-12 NOTE — Progress Notes (Signed)
    [facility] has been requested to perform a transesophageal echocardiogram on Frederick Moody for bacteremia.  After careful review of history and examination, the risks and benefits of transesophageal echocardiogram have been explained including risks of esophageal damage, perforation (1:10,000 risk), bleeding, pharyngeal hematoma as well as other potential complications associated with conscious sedation including aspiration, arrhythmia, respiratory failure and death. Alternatives to treatment were discussed, questions were answered. Patient's family both his sister Capers Hagmann and his daughter Chun Sellen are willing to for pt to proceed.  Best to do TEE while intubated at bedside. Cecilie Kicks, NP  01/12/2019 12:43 PM

## 2019-01-12 NOTE — Progress Notes (Signed)
Pharmacy Antibiotic Note  Frederick Moody is a 63 y.o. male admitted on 01/07/2019 with weakness/falls.  Pharmacy has been consulted for Vancomycin dosing for PNA. WBC 11.2. Renal function OK. Pt is already on Merrem for MSSA bacteremia and fever. Now adding vancomycin as pt decompensated tonight and required intubation.   Plan: Vancomycin 1250 mg IV q12h >>Estimated AUC: 476 Already on Merrem Trend WBC, temp, renal function  F/U infectious work-up Drug levels as indicated   Height: 5\' 9"  (175.3 cm) Weight: 194 lb 7.1 oz (88.2 kg) IBW/kg (Calculated) : 70.7  Temp (24hrs), Avg:99.7 F (37.6 C), Min:97.8 F (36.6 C), Max:101.3 F (38.5 C)  Recent Labs  Lab 01/07/19 1702 01/07/19 1902  01/08/19 0228  01/09/19 0407  01/10/19 0433  01/11/19 0348 01/11/19 1025 01/11/19 1121 01/11/19 1611 01/11/19 2041  WBC 24.5*  --   --  18.9*  --  13.3*  --  10.6*  --  11.2*  --   --   --   --   CREATININE 1.27*  --    < > 0.71   < > 0.55*   < > 0.52*   < > 0.54* 0.56* 0.58* 0.57* 0.55*  LATICACIDVEN 3.3* 1.5  --   --   --   --   --   --   --   --   --   --   --  1.4   < > = values in this interval not displayed.    Estimated Creatinine Clearance: 103.9 mL/min (A) (by C-G formula based on SCr of 0.55 mg/dL (L)).    Allergies  Allergen Reactions  . Penicillins Swelling and Rash    Neck became swollen Did it involve swelling of the face/tongue/throat, SOB, or low BP? Yes Did it involve sudden or severe rash/hives, skin peeling, or any reaction on the inside of your mouth or nose? Unk Did you need to seek medical attention at a hospital or doctor's office? Unk When did it last happen? Happened in the 1980's If all above answers are "NO", may proceed with cephalosporin use.    Narda Bonds, PharmD, BCPS Clinical Pharmacist Phone: [phone]

## 2019-01-12 NOTE — Procedures (Signed)
Arterial Catheter Insertion Procedure Note Frederick Moody [id number] 08-07-1955  Procedure: Insertion of Arterial Catheter  Indications: Blood pressure monitoring and Frequent blood sampling  Procedure Details Consent: Risks of procedure as well as the alternatives and risks of each were explained to the (patient/caregiver).  Consent for procedure obtained. and Unable to obtain consent because of sedated on the ventilator. Time Out: Verified patient identification, verified procedure, site/side was marked, verified correct patient position, special equipment/implants available, medications/allergies/relevent history reviewed, required imaging and test results available.  Performed  Maximum sterile technique was used including antiseptics, cap, gloves, gown, hand hygiene, mask and sheet. Skin prep: Chlorhexidine; local anesthetic administered 20 gauge catheter was inserted into left radial artery using the Seldinger technique. ULTRASOUND GUIDANCE USED: NO Evaluation Blood flow good; BP tracing good. Complications: No apparent complications.   Kathie Dike 01/12/2019

## 2019-01-12 NOTE — Progress Notes (Signed)
Name: Frederick SizerRobert Moody  MRN: [id number] DOB: 10-21-1955 LOS: 5   ADMISSION TIME: 01/07/2019, 1641  CONSULTATION DATE: 01/11/19  CHIEF COMPLAINT: Fatigue   Brief History   This is a 63 yo with history of uncontrolled DM and etoh abuse who presents for weakness and falls. Found to have etoh withdrawal and DKA and MSSA bacteremia.  Past Medical History  DM, ETOH Abuse  Principal Problem:   MSSA bacteremia Active Problems:   DKA (diabetic ketoacidoses) (HCC)   Alcohol withdrawal (HCC)   Aspiration pneumonia (HCC)   Sepsis (HCC)     Significant Hospital Events   01/07/19 -admitted for MSSA positive  01/11/2019-intubated and transferred to MICU   Consults:  ID Surgery  Cardiology    Procedures:  01/11/2019-intubation   Significant Diagnostic Tests:  7/17 TTE -normal systolic function with EF 55 to 60%.  Left atrium mildly dilated.  Mild mitral annular calcification present.  No stenosis of aortic valve but with mild thickening and calcification.Nile Riggs.   Micro Data:  01/07/19 - blood culture >> + MSSA (Resistant to cipro, clinda, erythro) 01/07/19 - Urine cx >> + staph aureus pan senstive (Resistant to cipro)  01/09/19 - Blood culture >>  01/10/19 - Urine Culture >>  01/12/19 - Blood culture >>    Antimicrobials:   Anti-infectives (From admission, onward)   Start     Dose/Rate Route Frequency Ordered Stop   01/12/19 1000  vancomycin (VANCOCIN) 1,250 mg in sodium chloride 0.9 % 250 mL IVPB     1,250 mg 166.7 mL/hr over 90 Minutes Intravenous Every 12 hours 01/12/19 0115     01/12/19 0115  vancomycin (VANCOCIN) 1,500 mg in sodium chloride 0.9 % 500 mL IVPB     1,500 mg 250 mL/hr over 120 Minutes Intravenous  Once 01/12/19 0100 01/12/19 0514   01/10/19 1230  meropenem (MERREM) 1 g in sodium chloride 0.9 % 100 mL IVPB     1 g 200 mL/hr over 30 Minutes Intravenous Every 8 hours 01/10/19 1146     01/08/19 2200  ceFAZolin (ANCEF) IVPB 2g/100 mL premix  Status:  Discontinued     2 g  200 mL/hr over 30 Minutes Intravenous Every 8 hours 01/08/19 1030 01/10/19 1146   01/08/19 1800  vancomycin (VANCOCIN) 1,500 mg in sodium chloride 0.9 % 500 mL IVPB  Status:  Discontinued     1,500 mg 250 mL/hr over 120 Minutes Intravenous Every 24 hours 01/07/19 1830 01/08/19 1030   01/08/19 0630  cefTRIAXone (ROCEPHIN) 2 g in sodium chloride 0.9 % 100 mL IVPB  Status:  Discontinued     2 g 200 mL/hr over 30 Minutes Intravenous Daily 01/08/19 0536 01/08/19 1030   01/08/19 0600  ceFEPIme (MAXIPIME) 2 g in sodium chloride 0.9 % 100 mL IVPB  Status:  Discontinued     2 g 200 mL/hr over 30 Minutes Intravenous Every 12 hours 01/07/19 1830 01/07/19 2132   01/07/19 1745  vancomycin (VANCOCIN) 2,000 mg in sodium chloride 0.9 % 500 mL IVPB     2,000 mg 250 mL/hr over 120 Minutes Intravenous  Once 01/07/19 1738 01/07/19 2013   01/07/19 1745  ceFEPIme (MAXIPIME) 2 g in sodium chloride 0.9 % 100 mL IVPB     2 g 200 mL/hr over 30 Minutes Intravenous  Once 01/07/19 1738 01/07/19 1904     Interim history/subjective:  Admitted overnight after requiring intubation.   Objective   BP 106/63   Pulse 100   Temp (!) 100.4 F (  38 C) (Axillary)   Resp 20   Ht 5\' 9"  (1.753 m)   Wt 88.2 kg   SpO2 100%   BMI 28.71 kg/m   Vent Mode: PRVC FiO2 (%):  [40 %-100 %] 40 % Set Rate:  [16 bmp] 16 bmp Vt Set:  [510 mL-560 mL] 560 mL PEEP:  [5 cmH20] 5 cmH20 Plateau Pressure:  [15 cmH20] 15 cmH20  Intake/Output      07/19 0701 - 07/20 0700 07/20 0701 - 07/21 0700   P.O. 90    I.V. (mL/kg) 3008.2 (34.1) 283.9 (3.2)   Other 150    IV Piggyback 2285.2 1029.3   Total Intake(mL/kg) 5533.3 (62.7) 1313.2 (14.9)   Urine (mL/kg/hr) 2250 (1.1) 75 (0.3)   Stool 0    Total Output 2250 75   Net +3283.3 +1238.2        Stool Occurrence 5 x     Filed Weights   01/11/19 0500 01/12/19 0000 01/12/19 0230  Weight: 90 kg 88.2 kg 88.2 kg    Physical Exam:  General: Sedated, lying in bed, obese Cardiovascular: RRR,  normal S1, S2. B/L 2+ RP.  No BLEE Respiratory: Lungs clear to auscultation. Abdomen: + BS. NT, ND, soft to palpation.  Extremities: Warm and well perfused. Integumentary: Left arm with some erythema and warmth.  Draining wound at upper back.  Wound is about 1 cm in diameter     Assessment & Plan:  CARDS 1. AFIB, likely 2/2 acute illness Amnioinfusion started overnight.  Decreased to 30 mg/h this morning.  Patient remains in sinus rhythm, will discontinue amiodarone in 24 hours. 2. MSSA bacteremia consulting cardiology for TEE to rule out vegetations.  He is scheduled for 01/13/2019 morning.  Will contact infectious disease after TEE to clarify antibiotic regimen pending vegetations.   PULM 1.  Acute hypoxic respiratory failure thought to be due to overmedication with benzodiazepines in setting of alcohol withdrawal.  Patient was intubated overnight and remains on vent this morning.  Patient will need swallow study once he is extubated as there was evidence of aspiration during intubation.  CT chest shows no pneumonia making this less likely cause of hypoxic respiratory failure.  ID 1. MSSA Bacteremia likely due to soft tissue abscess on upper back. On CT thoracic spine, there is a soft tissue abscess which does not extend into spinous process.  Will consult surgery today for bedside drainage for source control.  Patient's antibiotics currently include vancomycin and meropenem.  Antibiotics as below.  We will hold off on PICC line until after debridement as patient will likely require long course of antibiotics. 2.  Possible endocardidits  Per ID, will need 14-day course following negative cultures provided no vegetations.  If vegetations, will reconsult infectious disease.  Unclear if patient with history of IV drug use. TEE scheduled for tomorrow 01/13/19 provided patient remains intubated.  GI 1. NPO in setting of sedation will start tube feeds once surgery is finished with debridement. 2.  Diarrhea continue to monitor.  Replace fluids with maintenance IV fluids.  Continue to monitor.  See diet  NEURO 1. ETOH WITHDRAWAL patient currently sedated with fentanyl.  Will switch to intermittent fentanyl and transition to Precedex if blood pressure allows as this would be a more ideal sedation option in this patient's case.  ENDOCRINE 1. Diabetes -patient transitioned from insulin drip to Levemir around 0100.  We will continue Levemir and titrate daily as needed.  Will discontinue D5 and replace with normal saline.  HEME  1. Anemia  Hemoglobin at 10.2 (baseline normal), will continue to monitor.   Best practice:  Diet: NA Pain/Anxiety/Delirium protocol (if indicated): LORazepam  VAP protocol (if indicated): 40 degrees HOB DVT prophylaxis: lovenox for now GI prophylaxis: pepcid Glucose control: q4 SSI and levemi Mobility: NA Code Status:   Code Status: Full Code  Family Communication: Will discuss with family  Disposition: ICU     Melene Planachel E. Calven Gilkes, M.D.  PGY-2  Family Medicine  01/12/2019 9:37 AM

## 2019-01-12 NOTE — Progress Notes (Signed)
eLink Physician-Brief Progress Note Patient Name: Frederick Moody DOB: [birth year] MRN: [id number]   Date of Service  01/12/2019  HPI/Events of Note  Multiple issues: 1. Temp = 103.8 F - Request for Tylenol and 2. Agitation - Request for Fentanyl bolus from infusion. AST elevated --> no Tylenol. Creatinine = 0.8  eICU Interventions  Will order: 1. Motrin 400 mg per tube Q 6 hours PRN Temp > 101.0 F. 2. Fentanyl 50 mcg IV bolus from infusion Q 1 hour PRN sedation.      Intervention Category Major Interventions: Delirium, psychosis, severe agitation - evaluation and management;Other:  Frederick Moody 01/12/2019, 9:12 PM

## 2019-01-12 NOTE — Progress Notes (Signed)
PICC order to be cancelled. MD stated"line will be placed" during surgery today.

## 2019-01-12 NOTE — Anesthesia Preprocedure Evaluation (Addendum)
Anesthesia Evaluation  Patient identified by MRN, date of birth, ID band Patient unresponsive  General Assessment Comment:Pt sedated, on vent  Reviewed: Allergy & Precautions, NPO status , Patient's Chart, lab work & pertinent test results, Unable to perform ROS - Chart review only  History of Anesthesia Complications Negative for: history of anesthetic complications  Airway Mallampati: Intubated       Dental   Pulmonary pneumonia,  Intubated: encephalopathic from DKA, ETOH withdrawal 01/13/2019 CXR: B pulmonary edema at bases   breath sounds clear to auscultation   + intubated    Cardiovascular negative cardio ROS   Rhythm:Regular Rate:Tachycardia  Hypotensive with acute sepsis, requiring phenylephrine support  ECG: ST, rate 118  ECHO: 1. The left ventricle has normal systolic function, with an ejection fraction of 55-60%. The cavity size was normal. Left ventricular diastolic Doppler parameters are consistent with pseudonormalization. Elevated mean left atrial pressure No evidence of left ventricular regional wall motion abnormalities. 2. The right ventricle has normal systolic function. The cavity was normal. There is no increase in right ventricular wall thickness. Right ventricular systolic pressure is normal with an estimated pressure of 48.4 mmHg. 3. Left atrial size was mildly dilated. 4. There is mild mitral annular calcification present. The MR jet is centrally-directed. 5. Mild thickening of the aortic valve. Mild calcification of the aortic valve. No stenosis of the aortic valve. 6. The aortic root and ascending aorta are normal in size and structure.   Neuro/Psych PSYCHIATRIC DISORDERS (acute alcohol withdrawal) negative neurological ROS     GI/Hepatic negative GI ROS, (+)     substance abuse  alcohol use, Elevated LFTs   Endo/Other  diabetes (glu 156), Oral Hypoglycemic Agents  Renal/GU negative Renal ROS      Musculoskeletal negative musculoskeletal ROS (+)   Abdominal   Peds  Hematology  (+) Blood dyscrasia (Hb 10.5), anemia ,   Anesthesia Other Findings back abscess  Reproductive/Obstetrics                          Anesthesia Physical Anesthesia Plan  ASA: IV  Anesthesia Plan: General   Post-op Pain Management:    Induction: Intravenous  PONV Risk Score and Plan: 2 and Treatment may vary due to age or medical condition and Midazolam  Airway Management Planned: Oral ETT  Additional Equipment: Arterial line  Intra-op Plan:   Post-operative Plan: Post-operative intubation/ventilation  Informed Consent: I have reviewed the patients History and Physical, chart, labs and discussed the procedure including the risks, benefits and alternatives for the proposed anesthesia with the patient or authorized representative who has indicated his/her understanding and acceptance.     Consent reviewed with POA  Plan Discussed with: CRNA and Surgeon  Anesthesia Plan Comments: (Anesthetic plan discussed with sister,  Matteo Banke. As above, Dr. Roanna Banning discussed anesthetic care with pt's sister, GETA with routine monitors, existing ETT and A-line)     Anesthesia Quick Evaluation

## 2019-01-12 NOTE — Plan of Care (Signed)
  Problem: Safety: Goal: Ability to remain free from injury will improve Outcome: Progressing   Problem: Nutrition: Goal: Adequate nutrition will be maintained Outcome: Not Progressing Note: Pt has not been started on tube feeding due to potential surgery today. Will start tube feed after surgery per CCM MD

## 2019-01-12 NOTE — Progress Notes (Signed)
CRITICAL VALUE ALERT  Critical Value:  Lactic acid 2.9  Date & Time Notied:  0650   01/12/2019  Provider Notified: Warren Lacy  Orders Received/Actions taken:

## 2019-01-12 NOTE — Progress Notes (Signed)
Patient ID: Frederick Moody, male   DOB: 07-Jul-1955, 63 y.o.   MRN: [id number]  OR was ready for the patient.  CCM was placing a central line.  Delay to OR due to waiting for central line CXR.  Also, further delay because patient does not have an A-line.  Will postpone surgery today to allow  CCM to place a-line, check their CXR, etc.  Will drain abscess tomorrow.  Wound is draining spontaneously - not emergent.  [name]. [name], MD, [facility] Bee[facility] [phone]  01/12/2019 3:35 PM

## 2019-01-12 NOTE — Progress Notes (Signed)
eLink Physician-Brief Progress Note Patient Name: Frederick Moody DOB: 25-Apr-1956 MRN: [id number]   Date of Service  01/12/2019  HPI/Events of Note  Lactic Acid = 2.1 --> 2.9.  eICU Interventions  Will order: 1. Bolus with 0.9 NaCl 1 liter IV over 1 hour now.      Intervention Category Major Interventions: Acid-Base disturbance - evaluation and management  Sommer,Steven Eugene 01/12/2019, 6:57 AM

## 2019-01-12 NOTE — Progress Notes (Signed)
CRITICAL VALUE ALERT  Critical Value:  Lactic acid 2.1  Date & Time Notied:  01/12/2019   0230  Provider Notified: Warren Lacy   Orders Received/Actions taken:

## 2019-01-12 NOTE — Procedures (Addendum)
Endotracheal Intubation Procedure Note  Indication for endotracheal intubation: airway compromise. Airway Assessment: Mallampati Class: I (soft palate, uvula, fauces, and tonsillar pillars visible). Sedation: etomidate. Paralytic: rocuronium. Lidocaine: no. Atropine: no. Equipment: Macintosh 4 laryngoscope blade. Cricoid Pressure: no. Number of attempts: 2. ETT location confirmed by by auscultation, by CXR and ETCO2 monitor.  Of note the epiglottis had notable gastric content that were dried. Noted to be on it and even on the vocal cords as well. Requiring significant suction before grade 1 view attained.   Frederick Moody 01/12/2019

## 2019-01-12 NOTE — Procedures (Signed)
INDICATION: Septic shock PROCEDURE OPERATORS: Cassandria Santee, NP & Zettie Cooley, MD ATTENDING PHYSICIAN: Dr. Kara Mead In Attendance: Y   CONSENT:  Consent was obtained from patient's sister, Rudi Heap, prior to the procedure. Indications, risks, and benefits were explained at length.  The procedure was performed emergently and the permission was implied because of the emergent nature.    PROCEDURE SUMMARY:  A time out was performed. My hands were washed immediately prior to the procedure. I wore a surgical cap, mask with protective eyewear, full gown and sterile gloves throughout the procedure. The patient was placed in Trendelenburg position. RIGHT chest region was prepped using chlorhexidine scrub and draped in sterile fashion using a full drape and sterile probe cover employed. The medial and lateral heads of the sternocleidomastoid muscle were identified as was the carotid pulse. The Internal Jugular vein was identified using the ultrasound. Lidocaine was not used as patient already sedated. Using real-time out of plane guidance, the introducer needle was inserted into the Internal Jugular vein under direct ultrasound visualization. Venous blood was withdrawn. The syringe was removed and a guidewire was advanced into the introducer needle. The guidewire was visualized in the Internal Jugular Vein by ultrasound. A small incision was made at the skin surface with a scalpel and the introducer needle was exchanged for a dilator over the guidewire. After appropriate dilation was obtained, the dilator was exchanged over the wire for a triple lumen central venous catheter. The wire was removed and the catheter was sutured in place at 16 cm. A sterile sorbaview shield was placed over the catheter at the insertion site. The patient tolerated the procedure without any hemodynamic compromise. At time of procedure completion, all ports aspirated and flushed properly. Estimated blood loss is  minimal. Post-procedure chest x-ray confirms appropriate placement.   Wilber Oliphant, M.D.  PGY-2  Family Medicine  01/12/2019 3:24 PM

## 2019-01-12 NOTE — Progress Notes (Signed)
[facility]   Reason for visit: Follow up on MSSA bacteremia  Interval History: Last night patient had AMS and worsening patchy opacities on CXR. This along with minimal guarding of airway suggest patient may have new onset aspiration pneumonia. Due to concerns for acute worsening of sepsis and possible benzo overdose patient was transferred to ICU and intubated. Repeat cultures drawn and coverage was broadened to meropenem and vancomycin.   Subjective: Unable to be obtained, patient intubated/sedated.  Physical Exam: Constitutional: Patient sedated, intubated, somnolent Vitals:   01/12/19 0800 01/12/19 0900  BP: 115/72 106/63  Pulse: (!) 103 100  Resp: (!) 24 20  Temp: (!) 100.4 F (38 C)   SpO2: 100% 100%  Respiratory: CTAB Cardiovascular: RRR, no m/r/g GI: Soft, non-distended  Review of Systems: Unable to be obtained, patient intubated/sedated  Lab Results  Component Value Date   WBC 11.2 (H) 01/11/2019   HGB 10.2 (L) 01/12/2019   HCT 30.0 (L) 01/12/2019   MCV 92.6 01/11/2019   PLT 166 01/11/2019    Lab Results  Component Value Date   CREATININE 0.63 01/12/2019   BUN <5 (L) 01/12/2019   NA 134 (L) 01/12/2019   K 4.4 01/12/2019   CL 105 01/12/2019   CO2 20 (L) 01/12/2019    Lab Results  Component Value Date   ALT 34 01/12/2019   AST 73 (H) 01/12/2019   ALKPHOS 128 (H) 01/12/2019     Microbiology: Recent Results (from the past 240 hour(s))  Culture, blood (Routine x 2)     Status: Abnormal   Collection Time: 01/07/19 11:05 AM   Specimen: BLOOD  Result Value Ref Range Status   Specimen Description BLOOD BLOOD RIGHT FOREARM  Final   Special Requests   Final    BOTTLES DRAWN AEROBIC AND ANAEROBIC Blood Culture adequate volume   Culture  Setup Time   Final    GRAM POSITIVE COCCI IN CLUSTERS IN BOTH AEROBIC AND ANAEROBIC BOTTLES CRITICAL RESULT CALLED TO, READ BACK BY AND VERIFIED WITH: Antoine PrimasE. Martin PharmD 10:15 01/08/19 ([email or web address])  Performed at [facility], [address] [place], [place] [address]    Culture STAPHYLOCOCCUS AUREUS (A)  Final   Report Status 01/10/2019 FINAL  Final   Organism ID, Bacteria STAPHYLOCOCCUS AUREUS  Final      Susceptibility   Staphylococcus aureus - MIC*    CIPROFLOXACIN >=8 RESISTANT Resistant     ERYTHROMYCIN >=8 RESISTANT Resistant     GENTAMICIN <=0.5 SENSITIVE Sensitive     OXACILLIN 0.5 SENSITIVE Sensitive     TETRACYCLINE <=1 SENSITIVE Sensitive     VANCOMYCIN 1 SENSITIVE Sensitive     TRIMETH/SULFA <=10 SENSITIVE Sensitive     CLINDAMYCIN >=8 RESISTANT Resistant     RIFAMPIN <=0.5 SENSITIVE Sensitive     Inducible Clindamycin NEGATIVE Sensitive     * STAPHYLOCOCCUS AUREUS  Blood Culture ID Panel (Reflexed)     Status: Abnormal   Collection Time: 01/07/19 11:05 AM  Result Value Ref Range Status   Enterococcus species NOT DETECTED NOT DETECTED Final   Listeria monocytogenes NOT DETECTED NOT DETECTED Final   Staphylococcus species DETECTED (A) NOT DETECTED Final    Comment: CRITICAL RESULT CALLED TO, READ BACK BY AND VERIFIED WITH: Antoine PrimasE. Martin PharmD 10:15 01/08/19 ([email or web address])    Staphylococcus aureus (BCID) DETECTED (A) NOT DETECTED Final    Comment: Methicillin (oxacillin) susceptible Staphylococcus aureus (MSSA). Preferred therapy is anti staphylococcal beta lactam antibiotic (Cefazolin or Nafcillin),  unless clinically contraindicated. CRITICAL RESULT CALLED TO, READ BACK BY AND VERIFIED WITH: Ferne Coe PharmD 10:15 01/08/19 ([email or web address])    Methicillin resistance NOT DETECTED NOT DETECTED Final   Streptococcus species NOT DETECTED NOT DETECTED Final   Streptococcus agalactiae NOT DETECTED NOT DETECTED Final   Streptococcus pneumoniae NOT DETECTED NOT DETECTED Final   Streptococcus pyogenes NOT DETECTED NOT DETECTED Final   Acinetobacter baumannii NOT DETECTED NOT DETECTED Final   Enterobacteriaceae species NOT DETECTED NOT DETECTED Final   Enterobacter cloacae complex  NOT DETECTED NOT DETECTED Final   Escherichia coli NOT DETECTED NOT DETECTED Final   Klebsiella oxytoca NOT DETECTED NOT DETECTED Final   Klebsiella pneumoniae NOT DETECTED NOT DETECTED Final   Proteus species NOT DETECTED NOT DETECTED Final   Serratia marcescens NOT DETECTED NOT DETECTED Final   Haemophilus influenzae NOT DETECTED NOT DETECTED Final   Neisseria meningitidis NOT DETECTED NOT DETECTED Final   Pseudomonas aeruginosa NOT DETECTED NOT DETECTED Final   Candida albicans NOT DETECTED NOT DETECTED Final   Candida glabrata NOT DETECTED NOT DETECTED Final   Candida krusei NOT DETECTED NOT DETECTED Final   Candida parapsilosis NOT DETECTED NOT DETECTED Final   Candida tropicalis NOT DETECTED NOT DETECTED Final    Comment: Performed at [facility], [address], [place], [place] [address]  Culture, blood (Routine x 2)     Status: Abnormal   Collection Time: 01/07/19  5:25 PM   Specimen: BLOOD LEFT ARM  Result Value Ref Range Status   Specimen Description BLOOD LEFT ARM  Final   Special Requests   Final    BOTTLES DRAWN AEROBIC AND ANAEROBIC Blood Culture results may not be optimal due to an inadequate volume of blood received in culture bottles   Culture  Setup Time   Final    GRAM POSITIVE COCCI AEROBIC BOTTLE ONLY CRITICAL RESULT CALLED TO, READ BACK BY AND VERIFIED WITH: PHARMD E MARTIN [id number] AT 1107 AM BY CM    Culture (A)  Final    STAPHYLOCOCCUS AUREUS SUSCEPTIBILITIES PERFORMED ON PREVIOUS CULTURE WITHIN THE LAST 5 DAYS. Performed at [facility], [place] [address], [place], [place] [address]    Report Status 01/10/2019 FINAL  Final  SARS Coronavirus 2 (CEPHEID - Performed in [place] hospital lab), Hosp Order     Status: None   Collection Time: 01/07/19  5:35 PM   Specimen: Nasopharyngeal Swab  Result Value Ref Range Status   SARS Coronavirus 2 NEGATIVE NEGATIVE Final    Comment: (NOTE) If result is NEGATIVE SARS-CoV-2 target nucleic acids are NOT  DETECTED. The SARS-CoV-2 RNA is generally detectable in upper and lower  respiratory specimens during the acute phase of infection. The lowest  concentration of SARS-CoV-2 viral copies this assay can detect is 250  copies / mL. A negative result does not preclude SARS-CoV-2 infection  and should not be used as the sole basis for treatment or other  patient management decisions.  A negative result may occur with  improper specimen collection / handling, submission of specimen other  than nasopharyngeal swab, presence of viral mutation(s) within the  areas targeted by this assay, and inadequate number of viral copies  (<250 copies / mL). A negative result must be combined with clinical  observations, patient history, and epidemiological information. If result is POSITIVE SARS-CoV-2 target nucleic acids are DETECTED. The SARS-CoV-2 RNA is generally detectable in upper and lower  respiratory specimens dur ing the acute phase of infection.  Positive  results are indicative of active infection with SARS-CoV-2.  Clinical  correlation with patient history and other diagnostic information is  necessary to determine patient infection status.  Positive results do  not rule out bacterial infection or co-infection with other viruses. If result is PRESUMPTIVE POSTIVE SARS-CoV-2 nucleic acids MAY BE PRESENT.   A presumptive positive result was obtained on the submitted specimen  and confirmed on repeat testing.  While 2019 novel coronavirus  (SARS-CoV-2) nucleic acids may be present in the submitted sample  additional confirmatory testing may be necessary for epidemiological  and / or clinical management purposes  to differentiate between  SARS-CoV-2 and other Sarbecovirus currently known to infect humans.  If clinically indicated additional testing with an alternate test  methodology [phone]([id number]) is advised. The SARS-CoV-2 RNA is generally  detectable in upper and lower respiratory sp ecimens during  the acute  phase of infection. The expected result is Negative. Fact Sheet for Patients:  BoilerBrush.com.cyhttps://www.fda.gov/media/136312/download Fact Sheet for Healthcare Providers: https://pope.com/https://www.fda.gov/media/136313/download This test is not yet approved or cleared by the [place] FDA and has been authorized for detection and/or diagnosis of SARS-CoV-2 by FDA under an Emergency Use Authorization (EUA).  This EUA will remain in effect (meaning this test can be used) for the duration of the COVID-19 declaration under Section 564(b)(1) of the Act, 21 U.S.C. section 360bbb-3(b)(1), unless the authorization is terminated or revoked sooner. Performed at [facility], [address] [place], [place] [address]   Urine culture     Status: Abnormal   Collection Time: 01/07/19  6:22 PM   Specimen: In/Out Cath Urine  Result Value Ref Range Status   Specimen Description IN/OUT CATH URINE  Final   Special Requests NONE  Final   Culture (A)  Final    40,000 COLONIES/mL STAPHYLOCOCCUS AUREUS 20,000 COLONIES/mL STAPHYLOCOCCUS SPECIES (COAGULASE NEGATIVE) 1,000 COLONIES/mL VIRIDANS STREPTOCOCCUS Standardized susceptibility testing for this organism is not available. Performed at [facility], [address] [place], [place] [address]    Report Status 01/10/2019 FINAL  Final   Organism ID, Bacteria STAPHYLOCOCCUS AUREUS (A)  Final   Organism ID, Bacteria STAPHYLOCOCCUS SPECIES (COAGULASE NEGATIVE) (A)  Final      Susceptibility   Staphylococcus aureus - MIC*    CIPROFLOXACIN >=8 RESISTANT Resistant     GENTAMICIN <=0.5 SENSITIVE Sensitive     NITROFURANTOIN <=16 SENSITIVE Sensitive     OXACILLIN 0.5 SENSITIVE Sensitive     TETRACYCLINE <=1 SENSITIVE Sensitive     VANCOMYCIN 1 SENSITIVE Sensitive     TRIMETH/SULFA <=10 SENSITIVE Sensitive     CLINDAMYCIN <=0.25 SENSITIVE Sensitive     RIFAMPIN <=0.5 SENSITIVE Sensitive     Inducible Clindamycin NEGATIVE Sensitive     * 40,000 COLONIES/mL  STAPHYLOCOCCUS AUREUS   Staphylococcus species (coagulase negative) - MIC*    CIPROFLOXACIN <=0.5 SENSITIVE Sensitive     GENTAMICIN <=0.5 SENSITIVE Sensitive     NITROFURANTOIN 32 SENSITIVE Sensitive     OXACILLIN <=0.25 SENSITIVE Sensitive     TETRACYCLINE <=1 SENSITIVE Sensitive     VANCOMYCIN <=0.5 SENSITIVE Sensitive     TRIMETH/SULFA <=10 SENSITIVE Sensitive     CLINDAMYCIN <=0.25 SENSITIVE Sensitive     RIFAMPIN <=0.5 SENSITIVE Sensitive     Inducible Clindamycin NEGATIVE Sensitive     * 20,000 COLONIES/mL STAPHYLOCOCCUS SPECIES (COAGULASE NEGATIVE)  Culture, blood (routine x 2)     Status: None (Preliminary result)   Collection Time: 01/09/19  8:25 AM  Specimen: BLOOD LEFT HAND  Result Value Ref Range Status   Specimen Description BLOOD LEFT HAND  Final   Special Requests   Final    BOTTLES DRAWN AEROBIC ONLY Blood Culture adequate volume   Culture   Final    NO GROWTH 3 DAYS Performed at [facility], [address] [place], [place] [address]    Report Status PENDING  Incomplete  Culture, blood (routine x 2)     Status: None (Preliminary result)   Collection Time: 01/09/19  8:26 AM   Specimen: BLOOD LEFT HAND  Result Value Ref Range Status   Specimen Description BLOOD LEFT HAND  Final   Special Requests   Final    BOTTLES DRAWN AEROBIC ONLY Blood Culture adequate volume   Culture   Final    NO GROWTH 3 DAYS Performed at [facility], [address] [place], [place] [address]    Report Status PENDING  Incomplete  Culture, Urine     Status: None   Collection Time: 01/10/19 11:24 AM   Specimen: Urine, Random  Result Value Ref Range Status   Specimen Description URINE, RANDOM  Final   Special Requests NONE  Final   Culture   Final    NO GROWTH Performed at [facility], [address] [place], [place] [address]    Report Status 01/11/2019 FINAL  Final  C difficile quick scan w PCR reflex     Status: None   Collection Time: 01/11/19  3:44 PM    Specimen: STOOL  Result Value Ref Range Status   C Diff antigen NEGATIVE NEGATIVE Final   C Diff toxin NEGATIVE NEGATIVE Final   C Diff interpretation No C. difficile detected.  Final    Comment: Performed at [facility], [address] [place], [place] [address]  MRSA PCR Screening     Status: None   Collection Time: 01/11/19 11:42 PM   Specimen: Nasal Mucosa; Nasopharyngeal  Result Value Ref Range Status   MRSA by PCR NEGATIVE NEGATIVE Final    Comment:        The GeneXpert MRSA Assay (FDA approved for NASAL specimens only), is one component of a comprehensive MRSA colonization surveillance program. It is not intended to diagnose MRSA infection nor to guide or monitor treatment for MRSA infections. Performed at [facility], [address] [place], [place] [address]     Impression:  63 yo male with PMH of uncontrolled DM who presented with weakness, fever, tachycardia, elevated lactic acid consistent with sepsis and found to have MSSA bacteremia. Worsening CXR and clinical picture concerning for aspiration pneumonia.  Plan: 1. Switch patient to Unasyn and discontinue vanc/meropenem. Will provide coverage for MSSA bacteremia as well as possible aspiration pneumonia. Patient with remote history of penicillin allergy but history does not sound consistent with true allergy. Patient intubated, will monitor patient for reaction. Can narrow back to ancef following treatment for aspiration pneumonia - after 7 day course.  2. Patient needs TEE to evaluate for endocarditis

## 2019-01-12 NOTE — Consult Note (Signed)
[facility] Consult/Admission Note  Frederick SizerRobert Moody [birth year]  [id number].    Requesting Provider: Dr. Rogelia BogaButcher Chief Complaint/Reason for Consult: neck/back abscess  HPI:   Pt is a 63 year old diabetic with EtOH abuse admitted on 7/15 with DKA, EtOH withdrawal and MSSA bacteremia. Pt noted to have abscess to upper back/neck with drainage. CT scan of this area revealed a 11x5x6cm abscess extending to the superficial musculature with extension to the spinous processes without bony erosion. Overnight pt had AMS and noted worsening patchy opacities on CXR. He was intubated and transferred to the ICU. Pt is intubated and sedated to Hx is limited and obtained from EMR. Spoke with sister on the phone who states daughter would be signing consent.   Pt febrile overnight. Tmax 102.61F. Not tachycardic and blood pressures soft. Per nurse pt was having diarrhea but c diff was neg. Minimal urine output this am.   ROS:  Review of Systems  Unable to perform ROS: Intubated     No family history on file.  Past Medical History:  Diagnosis Date  . Diabetes mellitus without complication (HCC)   . ETOH abuse     Past Surgical History:  Procedure Laterality Date  . APPENDECTOMY      Social History:  reports that he has never smoked. He has never used smokeless tobacco. He reports previous alcohol use. He reports that he does not use drugs.  Allergies:  Allergies  Allergen Reactions  . Penicillins Swelling and Rash    Neck became swollen Did it involve swelling of the face/tongue/throat, SOB, or low BP? Yes Did it involve sudden or severe rash/hives, skin peeling, or any reaction on the inside of your mouth or nose? Unk Did you need to seek medical attention at a hospital or doctor's office? Unk When did it last happen? Happened in the 1980's If all above answers are "NO", may proceed with cephalosporin use.     Medications Prior to Admission  Medication Sig Dispense Refill   . naproxen sodium (ALEVE) 220 MG tablet Take 220-440 mg by mouth 2 (two) times daily as needed (for pain).    . metFORMIN (GLUCOPHAGE) 500 MG tablet Take 1 tablet (500 mg total) by mouth 2 (two) times daily with a meal. (Patient not taking: Reported on 01/07/2019) 60 tablet 0    Blood pressure (!) 84/53, pulse 87, temperature (!) 102 F (38.9 C), temperature source Oral, resp. rate (!) 28, height 5\' 9"  (1.753 m), weight 88.2 kg, SpO2 100 %.  Physical Exam Vitals signs and nursing note reviewed. Exam conducted with a chaperone present.  Constitutional:      Appearance: He is normal weight. He is not diaphoretic.     Interventions: He is sedated and intubated.  HENT:     Head: Normocephalic and atraumatic.     Nose: Nose normal.     Mouth/Throat:     Lips: Pink.     Mouth: Mucous membranes are moist.  Eyes:     General: Lids are normal.  Neck:     Musculoskeletal: Neck supple.  Cardiovascular:     Rate and Rhythm: Normal rate and regular rhythm.     Heart sounds: Normal heart sounds. No murmur.  Pulmonary:     Effort: No respiratory distress. He is intubated.     Breath sounds: Normal breath sounds. No wheezing.     Comments: Course breath sounds heard throughout Abdominal:     General: Bowel sounds are decreased. There is no distension.  Palpations: Abdomen is soft. Abdomen is not rigid.     Tenderness: There is no guarding.  Musculoskeletal:        General: No tenderness or deformity.  Skin:    General: Skin is warm and dry.     Findings: Abscess (upper back/neck with purulent drainage, large area of induration, minimal erythema) and rash (erythema, induration, increased warmth to antecubital region of LUE. pitting edema of BUE ) present.  Neurological:     Comments: Intubated and sedated, unable to assess      Results for orders placed or performed during the hospital encounter of 01/07/19 (from the past 48 hour(s))  Glucose, capillary     Status: Abnormal    Collection Time: 01/10/19 12:50 PM  Result Value Ref Range   Glucose-Capillary 172 (H) 70 - 99 mg/dL  Glucose, capillary     Status: Abnormal   Collection Time: 01/10/19  1:50 PM  Result Value Ref Range   Glucose-Capillary 194 (H) 70 - 99 mg/dL  Glucose, capillary     Status: Abnormal   Collection Time: 01/10/19  2:55 PM  Result Value Ref Range   Glucose-Capillary 196 (H) 70 - 99 mg/dL  Basic metabolic panel     Status: Abnormal   Collection Time: 01/10/19  4:07 PM  Result Value Ref Range   Sodium 132 (L) 135 - 145 mmol/L   Potassium 3.8 3.5 - 5.1 mmol/L    Comment: DELTA CHECK NOTED   Chloride 104 98 - 111 mmol/L   CO2 20 (L) 22 - 32 mmol/L   Glucose, Bld 200 (H) 70 - 99 mg/dL   BUN 7 (L) 8 - 23 mg/dL   Creatinine, Ser 1.61 0.61 - 1.24 mg/dL   Calcium 7.2 (L) 8.9 - 10.3 mg/dL   GFR calc non Af Amer >60 >60 mL/min   GFR calc Af Amer >60 >60 mL/min   Anion gap 8 5 - 15    Comment: Performed at [facility], [address], [place], [place] [address]  Glucose, capillary     Status: Abnormal   Collection Time: 01/10/19  4:26 PM  Result Value Ref Range   Glucose-Capillary 178 (H) 70 - 99 mg/dL  Urinalysis, Routine w reflex microscopic     Status: Abnormal   Collection Time: 01/10/19  5:23 PM  Result Value Ref Range   Color, Urine AMBER (A) YELLOW    Comment: BIOCHEMICALS MAY BE AFFECTED BY COLOR   APPearance CLEAR CLEAR   Specific Gravity, Urine 1.015 1.005 - 1.030   pH 6.0 5.0 - 8.0   Glucose, UA 150 (A) NEGATIVE mg/dL   Hgb urine dipstick SMALL (A) NEGATIVE   Bilirubin Urine NEGATIVE NEGATIVE   Ketones, ur 5 (A) NEGATIVE mg/dL   Protein, ur NEGATIVE NEGATIVE mg/dL   Nitrite NEGATIVE NEGATIVE   Leukocytes,Ua SMALL (A) NEGATIVE   RBC / HPF 0-5 0 - 5 RBC/hpf   WBC, UA 21-50 0 - 5 WBC/hpf   Bacteria, UA NONE SEEN NONE SEEN   Squamous Epithelial / LPF 0-5 0 - 5   Budding Yeast PRESENT     Comment: Performed at [facility], [address], [place], [place]  [address]  Glucose, capillary     Status: Abnormal   Collection Time: 01/10/19  5:31 PM  Result Value Ref Range   Glucose-Capillary 200 (H) 70 - 99 mg/dL  Glucose, capillary     Status: Abnormal   Collection Time: 01/10/19  6:52 PM  Result Value Ref Range   Glucose-Capillary 133 (H) 70 - 99 mg/dL  Glucose, capillary     Status: Abnormal   Collection Time: 01/10/19  7:57 PM  Result Value Ref Range   Glucose-Capillary 100 (H) 70 - 99 mg/dL  Basic metabolic panel     Status: Abnormal   Collection Time: 01/10/19  8:42 PM  Result Value Ref Range   Sodium 132 (L) 135 - 145 mmol/L   Potassium 3.7 3.5 - 5.1 mmol/L   Chloride 100 98 - 111 mmol/L   CO2 21 (L) 22 - 32 mmol/L   Glucose, Bld 129 (H) 70 - 99 mg/dL   BUN 7 (L) 8 - 23 mg/dL   Creatinine, Ser 1.61 (L) 0.61 - 1.24 mg/dL   Calcium 7.3 (L) 8.9 - 10.3 mg/dL   GFR calc non Af Amer >60 >60 mL/min   GFR calc Af Amer >60 >60 mL/min   Anion gap 11 5 - 15    Comment: Performed at [facility], [address], [place], [place] [address]  Glucose, capillary     Status: Abnormal   Collection Time: 01/10/19  9:05 PM  Result Value Ref Range   Glucose-Capillary 122 (H) 70 - 99 mg/dL  Glucose, capillary     Status: Abnormal   Collection Time: 01/10/19 10:11 PM  Result Value Ref Range   Glucose-Capillary 157 (H) 70 - 99 mg/dL  Glucose, capillary     Status: Abnormal   Collection Time: 01/10/19 11:12 PM  Result Value Ref Range   Glucose-Capillary 156 (H) 70 - 99 mg/dL  Basic metabolic panel     Status: Abnormal   Collection Time: 01/10/19 11:46 PM  Result Value Ref Range   Sodium 129 (L) 135 - 145 mmol/L   Potassium 3.7 3.5 - 5.1 mmol/L   Chloride 101 98 - 111 mmol/L   CO2 20 (L) 22 - 32 mmol/L   Glucose, Bld 187 (H) 70 - 99 mg/dL   BUN 6 (L) 8 - 23 mg/dL   Creatinine, Ser 5.40 0.61 - 1.24 mg/dL   Calcium 7.1 (L) 8.9 - 10.3 mg/dL   GFR calc non Af Amer >60 >60 mL/min   GFR calc Af Amer >60 >60 mL/min   Anion gap 8 5 - 15     Comment: Performed at [facility], [address], [place], [place] [address]  Glucose, capillary     Status: Abnormal   Collection Time: 01/11/19 12:16 AM  Result Value Ref Range   Glucose-Capillary 171 (H) 70 - 99 mg/dL  Glucose, capillary     Status: Abnormal   Collection Time: 01/11/19  1:17 AM  Result Value Ref Range   Glucose-Capillary 201 (H) 70 - 99 mg/dL  Glucose, capillary     Status: Abnormal   Collection Time: 01/11/19  2:19 AM  Result Value Ref Range   Glucose-Capillary 194 (H) 70 - 99 mg/dL  Glucose, capillary     Status: Abnormal   Collection Time: 01/11/19  3:40 AM  Result Value Ref Range   Glucose-Capillary 154 (H) 70 - 99 mg/dL  Basic metabolic panel     Status: Abnormal   Collection Time: 01/11/19  3:48 AM  Result Value Ref Range   Sodium 134 (L) 135 - 145 mmol/L   Potassium 3.3 (L) 3.5 - 5.1 mmol/L   Chloride 103 98 - 111 mmol/L   CO2 22 22 - 32 mmol/L   Glucose, Bld 186 (H) 70 -  99 mg/dL   BUN 6 (L) 8 - 23 mg/dL   Creatinine, Ser 1.61 (L) 0.61 - 1.24 mg/dL   Calcium 7.3 (L) 8.9 - 10.3 mg/dL   GFR calc non Af Amer >60 >60 mL/min   GFR calc Af Amer >60 >60 mL/min   Anion gap 9 5 - 15    Comment: Performed at [facility], [address] [place], [place] [address]  CBC     Status: Abnormal   Collection Time: 01/11/19  3:48 AM  Result Value Ref Range   WBC 11.2 (H) 4.0 - 10.5 K/uL   RBC 3.49 (L) 4.22 - 5.81 MIL/uL   Hemoglobin 11.6 (L) 13.0 - 17.0 g/dL   HCT 54.0 (L) 98.1 - 19.1 %   MCV 92.6 80.0 - 100.0 fL   MCH 33.2 26.0 - 34.0 pg   MCHC 35.9 30.0 - 36.0 g/dL   RDW 47.8 29.5 - 62.1 %   Platelets 166 150 - 400 K/uL   nRBC 0.0 0.0 - 0.2 %    Comment: Performed at [facility], [address], [place], [place] [address]  Magnesium     Status: None   Collection Time: 01/11/19  3:48 AM  Result Value Ref Range   Magnesium 1.8 1.7 - 2.4 mg/dL    Comment: Performed at [facility], [address], [place], [place] [address]   Phosphorus     Status: None   Collection Time: 01/11/19  3:48 AM  Result Value Ref Range   Phosphorus 2.6 2.5 - 4.6 mg/dL    Comment: Performed at [facility], [address] [place], [place] [address]  Glucose, capillary     Status: Abnormal   Collection Time: 01/11/19  4:41 AM  Result Value Ref Range   Glucose-Capillary 203 (H) 70 - 99 mg/dL  Glucose, capillary     Status: Abnormal   Collection Time: 01/11/19  5:43 AM  Result Value Ref Range   Glucose-Capillary 116 (H) 70 - 99 mg/dL  Glucose, capillary     Status: Abnormal   Collection Time: 01/11/19  6:44 AM  Result Value Ref Range   Glucose-Capillary 110 (H) 70 - 99 mg/dL  Glucose, capillary     Status: Abnormal   Collection Time: 01/11/19  7:35 AM  Result Value Ref Range   Glucose-Capillary 129 (H) 70 - 99 mg/dL  Glucose, capillary     Status: Abnormal   Collection Time: 01/11/19  9:24 AM  Result Value Ref Range   Glucose-Capillary 161 (H) 70 - 99 mg/dL  Basic metabolic panel     Status: Abnormal   Collection Time: 01/11/19 10:25 AM  Result Value Ref Range   Sodium 132 (L) 135 - 145 mmol/L   Potassium 4.4 3.5 - 5.1 mmol/L    Comment: SLIGHT HEMOLYSIS   Chloride 104 98 - 111 mmol/L   CO2 14 (L) 22 - 32 mmol/L   Glucose, Bld 191 (H) 70 - 99 mg/dL   BUN 5 (L) 8 - 23 mg/dL   Creatinine, Ser 8.41 (L) 0.61 - 1.24 mg/dL   Calcium 7.1 (L) 8.9 - 10.3 mg/dL   GFR calc non Af Amer >60 >60 mL/min   GFR calc Af Amer >60 >60 mL/min   Anion gap 14 5 - 15    Comment: Performed at [facility], [address] [place], [place] [address]  Glucose, capillary     Status: Abnormal   Collection  Time: 01/11/19 10:26 AM  Result Value Ref Range   Glucose-Capillary 164 (H) 70 - 99 mg/dL  Basic metabolic panel     Status: Abnormal   Collection Time: 01/11/19 11:21 AM  Result Value Ref Range   Sodium 133 (L) 135 - 145 mmol/L   Potassium 3.9 3.5 - 5.1 mmol/L   Chloride 103 98 - 111 mmol/L   CO2 18 (L) 22 - 32 mmol/L    Glucose, Bld 211 (H) 70 - 99 mg/dL   BUN <5 (L) 8 - 23 mg/dL   Creatinine, Ser 1.610.58 (L) 0.61 - 1.24 mg/dL   Calcium 7.1 (L) 8.9 - 10.3 mg/dL   GFR calc non Af Amer >60 >60 mL/min   GFR calc Af Amer >60 >60 mL/min   Anion gap 12 5 - 15    Comment: Performed at [facility], [address] [place], [place] [address]  Glucose, capillary     Status: Abnormal   Collection Time: 01/11/19 11:40 AM  Result Value Ref Range   Glucose-Capillary 198 (H) 70 - 99 mg/dL  Glucose, capillary     Status: Abnormal   Collection Time: 01/11/19 12:53 PM  Result Value Ref Range   Glucose-Capillary 196 (H) 70 - 99 mg/dL  Glucose, capillary     Status: Abnormal   Collection Time: 01/11/19  1:58 PM  Result Value Ref Range   Glucose-Capillary 167 (H) 70 - 99 mg/dL  Glucose, capillary     Status: Abnormal   Collection Time: 01/11/19  3:19 PM  Result Value Ref Range   Glucose-Capillary 131 (H) 70 - 99 mg/dL  C difficile quick scan w PCR reflex     Status: None   Collection Time: 01/11/19  3:44 PM   Specimen: STOOL  Result Value Ref Range   C Diff antigen NEGATIVE NEGATIVE   C Diff toxin NEGATIVE NEGATIVE   C Diff interpretation No C. difficile detected.     Comment: Performed at [facility], [address] [place], [place] [address]  Basic metabolic panel     Status: Abnormal   Collection Time: 01/11/19  4:11 PM  Result Value Ref Range   Sodium 135 135 - 145 mmol/L   Potassium 3.6 3.5 - 5.1 mmol/L   Chloride 107 98 - 111 mmol/L   CO2 20 (L) 22 - 32 mmol/L   Glucose, Bld 141 (H) 70 - 99 mg/dL   BUN 6 (L) 8 - 23 mg/dL   Creatinine, Ser 1.190.57 (L) 0.61 - 1.24 mg/dL   Calcium 7.2 (L) 8.9 - 10.3 mg/dL   GFR calc non Af Amer >60 >60 mL/min   GFR calc Af Amer >60 >60 mL/min   Anion gap 8 5 - 15    Comment: Performed at [facility], [address] [place], [place] [address]  Glucose, capillary     Status: Abnormal   Collection Time: 01/11/19  4:27 PM  Result Value Ref Range    Glucose-Capillary 130 (H) 70 - 99 mg/dL  Glucose, capillary     Status: Abnormal   Collection Time: 01/11/19  5:57 PM  Result Value Ref Range   Glucose-Capillary 155 (H) 70 - 99 mg/dL  Glucose, capillary     Status: Abnormal   Collection Time: 01/11/19  6:59 PM  Result Value Ref Range   Glucose-Capillary 175 (H) 70 - 99 mg/dL  Glucose, capillary     Status: Abnormal   Collection Time: 01/11/19  8:06 PM  Result Value Ref Range   Glucose-Capillary 191 (H) 70 - 99 mg/dL  Basic metabolic panel     Status: Abnormal   Collection Time: 01/11/19  8:41 PM  Result Value Ref Range   Sodium 133 (L) 135 - 145 mmol/L   Potassium 3.6 3.5 - 5.1 mmol/L   Chloride 103 98 - 111 mmol/L   CO2 21 (L) 22 - 32 mmol/L   Glucose, Bld 216 (H) 70 - 99 mg/dL   BUN <5 (L) 8 - 23 mg/dL   Creatinine, Ser 1.610.55 (L) 0.61 - 1.24 mg/dL   Calcium 7.0 (L) 8.9 - 10.3 mg/dL   GFR calc non Af Amer >60 >60 mL/min   GFR calc Af Amer >60 >60 mL/min   Anion gap 9 5 - 15    Comment: Performed at [facility], [address] [place], [place] [address]  Lactic acid, plasma     Status: None   Collection Time: 01/11/19  8:41 PM  Result Value Ref Range   Lactic Acid, Venous 1.4 0.5 - 1.9 mmol/L    Comment: Performed at [facility], [address] [place], [place] [address]  Glucose, capillary     Status: Abnormal   Collection Time: 01/11/19  8:52 PM  Result Value Ref Range   Glucose-Capillary 202 (H) 70 - 99 mg/dL  Blood gas, arterial     Status: None   Collection Time: 01/11/19  8:55 PM  Result Value Ref Range   O2 Content 2.0 L/min   Delivery systems NASAL CANNULA    pH, Arterial 7.421 7.350 - 7.450   pCO2 arterial 36.4 32.0 - 48.0 mmHg   pO2, Arterial 92.3 83.0 - 108.0 mmHg   Bicarbonate 22.9 20.0 - 28.0 mmol/L   Acid-base deficit 0.7 0.0 - 2.0 mmol/L   O2 Saturation 96.4 %   Patient temperature 101.0    Collection site LEFT RADIAL    Drawn by [id number]    Sample type ARTERIAL DRAW    Allens test  (pass/fail) PASS PASS  Glucose, capillary     Status: Abnormal   Collection Time: 01/11/19 10:02 PM  Result Value Ref Range   Glucose-Capillary 188 (H) 70 - 99 mg/dL  Glucose, capillary     Status: Abnormal   Collection Time: 01/11/19 11:06 PM  Result Value Ref Range   Glucose-Capillary 174 (H) 70 - 99 mg/dL  MRSA PCR Screening     Status: None   Collection Time: 01/11/19 11:42 PM   Specimen: Nasal Mucosa; Nasopharyngeal  Result Value Ref Range   MRSA by PCR NEGATIVE NEGATIVE    Comment:        The GeneXpert MRSA Assay (FDA approved for NASAL specimens only), is one component of a comprehensive MRSA colonization surveillance program. It is not intended to diagnose MRSA infection nor to guide or monitor treatment for MRSA infections. Performed at [facility], [address] [place], [place] [address]   Glucose, capillary     Status: Abnormal   Collection Time: 01/12/19 12:25 AM  Result Value Ref Range   Glucose-Capillary 137 (H) 70 - 99 mg/dL  I-STAT 7, (LYTES, BLD GAS, ICA, H+H)     Status: Abnormal   Collection Time: 01/12/19  1:17 AM  Result Value Ref Range   pH, Arterial 7.406 7.350 - 7.450   pCO2 arterial 37.9 32.0 - 48.0 mmHg   pO2, Arterial 207.0 (H) 83.0 - 108.0 mmHg   Bicarbonate 23.7 20.0 - 28.0  mmol/L   TCO2 25 22 - 32 mmol/L   O2 Saturation 100.0 %   Acid-base deficit 1.0 0.0 - 2.0 mmol/L   Sodium 139 135 - 145 mmol/L   Potassium 3.2 (L) 3.5 - 5.1 mmol/L   Calcium, Ion 1.11 (L) 1.15 - 1.40 mmol/L   HCT 30.0 (L) 39.0 - 52.0 %   Hemoglobin 10.2 (L) 13.0 - 17.0 g/dL   Patient temperature 16.1 F    Collection site RADIAL, ALLEN'S TEST ACCEPTABLE    Drawn by Operator    Sample type ARTERIAL   Glucose, capillary     Status: Abnormal   Collection Time: 01/12/19  1:27 AM  Result Value Ref Range   Glucose-Capillary 134 (H) 70 - 99 mg/dL  Comprehensive metabolic panel     Status: Abnormal   Collection Time: 01/12/19  1:43 AM  Result Value Ref Range    Sodium 135 135 - 145 mmol/L   Potassium 4.1 3.5 - 5.1 mmol/L    Comment: DELTA CHECK NOTED   Chloride 106 98 - 111 mmol/L   CO2 21 (L) 22 - 32 mmol/L   Glucose, Bld 146 (H) 70 - 99 mg/dL   BUN <5 (L) 8 - 23 mg/dL   Creatinine, Ser 0.96 (L) 0.61 - 1.24 mg/dL   Calcium 6.9 (L) 8.9 - 10.3 mg/dL   Total Protein 5.5 (L) 6.5 - 8.1 g/dL   Albumin 1.3 (L) 3.5 - 5.0 g/dL   AST 73 (H) 15 - 41 U/L   ALT 34 0 - 44 U/L   Alkaline Phosphatase 128 (H) 38 - 126 U/L   Total Bilirubin 1.9 (H) 0.3 - 1.2 mg/dL   GFR calc non Af Amer >60 >60 mL/min   GFR calc Af Amer >60 >60 mL/min   Anion gap 8 5 - 15    Comment: Performed at [facility], [address], [place], [place] [address]  Lactic acid, plasma     Status: Abnormal   Collection Time: 01/12/19  1:43 AM  Result Value Ref Range   Lactic Acid, Venous 2.1 (HH) 0.5 - 1.9 mmol/L    Comment: CRITICAL RESULT CALLED TO, READ BACK BY AND VERIFIED WITH: B.CUMMINGS,RN 0226 01/12/2019 M.CAMPBELL Performed at [facility], [address] [place], [place] [address]   Glucose, capillary     Status: Abnormal   Collection Time: 01/12/19  2:36 AM  Result Value Ref Range   Glucose-Capillary 150 (H) 70 - 99 mg/dL  Glucose, capillary     Status: Abnormal   Collection Time: 01/12/19  4:25 AM  Result Value Ref Range   Glucose-Capillary 172 (H) 70 - 99 mg/dL  Basic metabolic panel     Status: Abnormal   Collection Time: 01/12/19  5:11 AM  Result Value Ref Range   Sodium 134 (L) 135 - 145 mmol/L   Potassium 4.4 3.5 - 5.1 mmol/L   Chloride 105 98 - 111 mmol/L   CO2 20 (L) 22 - 32 mmol/L   Glucose, Bld 169 (H) 70 - 99 mg/dL   BUN <5 (L) 8 - 23 mg/dL   Creatinine, Ser 1.47 0.61 - 1.24 mg/dL   Calcium 6.7 (L) 8.9 - 10.3 mg/dL   GFR calc non Af Amer >60 >60 mL/min   GFR calc Af Amer >60 >60 mL/min   Anion gap 9 5 - 15    Comment: Performed at [facility], [address] [place], [place] [address]  Lactic acid, plasma  Status: Abnormal    Collection Time: 01/12/19  5:11 AM  Result Value Ref Range   Lactic Acid, Venous 2.9 (HH) 0.5 - 1.9 mmol/L    Comment: CRITICAL RESULT CALLED TO, READ BACK BY AND VERIFIED WITH: WHITE M,RN 01/12/19 5[address] [facility] Performed at [facility], [address] [place], [place] [address]   Glucose, capillary     Status: Abnormal   Collection Time: 01/12/19  7:57 AM  Result Value Ref Range   Glucose-Capillary 166 (H) 70 - 99 mg/dL  Magnesium     Status: Abnormal   Collection Time: 01/12/19 10:05 AM  Result Value Ref Range   Magnesium 1.6 (L) 1.7 - 2.4 mg/dL    Comment: Performed at [facility] [address], [place], [place] [address]  Phosphorus     Status: Abnormal   Collection Time: 01/12/19 10:05 AM  Result Value Ref Range   Phosphorus 1.8 (L) 2.5 - 4.6 mg/dL    Comment: Performed at [facility] [address] [place], [place] [address]  Glucose, capillary     Status: None   Collection Time: 01/12/19 11:38 AM  Result Value Ref Range   Glucose-Capillary 86 70 - 99 mg/dL   Dg Elbow Complete Left (3+view)  Result Date: 01/11/2019 CLINICAL DATA:  Left elbow swelling. EXAM: LEFT ELBOW - COMPLETE 3+ VIEW COMPARISON:  None. FINDINGS: There is no evidence of fracture, dislocation, or joint effusion. There is no evidence of arthropathy or other focal bone abnormality. Soft tissues are unremarkable. IMPRESSION: Negative. Electronically Signed   By: Marijo Conception M.D.   On: 01/11/2019 13:52   Ct Head Wo Contrast  Result Date: 01/12/2019 CLINICAL DATA:  Encephalopathy EXAM: CT HEAD WITHOUT CONTRAST TECHNIQUE: Contiguous axial images were obtained from the base of the skull through the vertex without intravenous contrast. COMPARISON:  02/18/2018 head CT FINDINGS: Brain: No evidence of acute infarction, hemorrhage, hydrocephalus, extra-axial collection or mass lesion/mass effect. 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 Small remote high left frontal cortex infarct.  Vascular: Atherosclerotic calcification Skull: Normal. Negative for fracture or focal lesion. Sinuses/Orbits: Right maxillary sinusitis with mucosal thickening and fluid level, also seen in 2019. Partial left mastoid opacification in the setting of intubation and nasopharyngeal fluid IMPRESSION: 1. No acute finding or change from 2019. 2. Small remote infarcts. 3. Chronic right maxillary sinusitis. Electronically Signed   By: Monte Fantasia M.D.   On: 01/12/2019 04:43   Ct Chest Wo Contrast  Result Date: 01/12/2019 CLINICAL DATA:  Acute respiratory illness.  MSSA bacteremia. EXAM: CT CHEST WITHOUT CONTRAST TECHNIQUE: Multidetector CT imaging of the chest was performed following the standard protocol without IV contrast. COMPARISON:  None. FINDINGS: Cardiovascular: Normal heart size. Coronary atherosclerotic calcification. No pericardial effusion or vascular finding without contrast. Mediastinum/Nodes: Nasogastric tube tip reaches the stomach at least. Mild prominence of mediastinal lymph nodes considered reactive. Endotracheal tube in good position. Lungs/Pleura: Moderate bilateral layering pleural effusion with atelectasis of the dependent lungs, multi segment. No pulmonary edema or pneumothorax. Upper Abdomen: Negative Musculoskeletal: Subcutaneous collection in the upper midline back. Dedicated thoracic spine reformats described separately. IMPRESSION: 1. Moderate layering pleural effusions with multi segment atelectasis. 2. Dorsal cervicothoracic soft tissue inflammation as described on dedicated thoracic spine study. Electronically Signed   By: Monte Fantasia M.D.   On: 01/12/2019 04:59   Ct Thoracic Spine W Wo Contrast  Result Date: 01/12/2019 CLINICAL DATA:  Pressure ulcer with draining purulence from wound  EXAM: CT THORACIC SPINE WITHOUT AND WITH CONTRAST TECHNIQUE: Multidetector CT images of the thoracic was performed following the standard protocol before and during bolus administration of  intravenous contrast. CONTRAST:  75mL OMNIPAQUE IOHEXOL 300 MG/ML  SOLN COMPARISON:  None. FINDINGS: Alignment: Unremarkable Vertebrae: Soft tissue inflammation extends to the spinous processes of the lower cervical and upper thoracic spine without bony erosion. No underlying discitis/osteomyelitis. Remote T4 superior endplate fracture. Paraspinal and other soft tissues: Lobulated subcutaneous fluid collection measuring 11 x 5 x 6 cm, extending from the skin surface to the para median trapezius and rhomboids. Small volume gas is seen within the anti dependent collection which is multi septated/lobulated. Left thyroid nodule considered incidental. Atherosclerosis. Moderate pleural effusions with multi segment pulmonary collapse. Disc levels: Generalized spondylosis. Notable C5-6 right eccentric endplate and uncovertebral ridging with right foraminal impingement. IMPRESSION: 11 x 5 x 6 cm abscess in the dorsal cervicothoracic soft tissues extending from the skin surface into the superficial musculature. There is extension to spinous processes without bony erosion. Electronically Signed   By: Marnee Spring M.D.   On: 01/12/2019 05:04   Dg Chest Port 1 View  Result Date: 01/11/2019 CLINICAL DATA:  Intubation EXAM: PORTABLE CHEST 1 VIEW COMPARISON:  January 11, 2019 FINDINGS: There has been interval placement of an endotracheal tube tip terminates approximately 3 cm above the carina. The enteric tube extends below the left hemidiaphragm. Again noted are diffuse multifocal bilateral airspace opacities. There are small bilateral pleural effusions. There is no pneumothorax. The heart size is stable. Aortic calcifications are noted. IMPRESSION: Lines and tubes as above. Persistent bilateral airspace opacities similar to prior study. Bilateral pleural effusions with short interval increase in size. Electronically Signed   By: Katherine Mantle M.D.   On: 01/11/2019 23:58   Dg Chest Port 1 View  Result Date:  01/11/2019 CLINICAL DATA:  Respiratory distress EXAM: PORTABLE CHEST 1 VIEW COMPARISON:  January 10, 2019 FINDINGS: There are worsening patchy bilateral airspace opacities. There may be small bilateral pleural effusions. The heart size is stable. Aortic calcifications are noted. There is no acute osseous abnormality. IMPRESSION: Worsening multifocal airspace opacities. Electronically Signed   By: Katherine Mantle M.D.   On: 01/11/2019 21:37   Dg Chest Port 1 View  Result Date: 01/10/2019 CLINICAL DATA:  63 year old male with fever, history of diabetes and alcohol abuse EXAM: PORTABLE CHEST 1 VIEW COMPARISON:  Prior chest x-ray 01/07/2019 FINDINGS: New patchy right mid and lower lung airspace opacity. Additionally, there may be some patchy airspace opacity in the retrocardiac region of the left lower lobe. Inspiratory volumes are low. There is mild vascular congestion. Borderline cardiomegaly. Atherosclerotic calcifications present in the transverse aorta. No pneumothorax. No acute osseous abnormality. IMPRESSION: 1. New patchy airspace opacities in the right mid lung, right base and left retrocardiac region. Differential considerations include multifocal pneumonia, aspiration pneumonitis, and, in the appropriate clinical setting, atypical/viral pneumonia. 2. Cardiomegaly with mild vascular congestion but no overt pulmonary edema. 3.  Aortic Atherosclerosis (ICD10-170.0) Electronically Signed   By: Malachy Moan M.D.   On: 01/10/2019 13:11   [place] Ekg Site Rite  Result Date: 01/11/2019 If Site Rite image not attached, placement could not be confirmed due to current cardiac rhythm.     Assessment/Plan Principal Problem:   MSSA bacteremia Active Problems:   DKA (diabetic ketoacidoses) (HCC)   Alcohol withdrawal (HCC)   Aspiration pneumonia (HCC)   Sepsis (HCC)   Acute encephalopathy   Acute respiratory failure (HCC)  Sepsis likely 2/2  Posterior neck/upper back abscess - CT showed 11x5x6cm  abscess extending to the superficial musculature with extension to the spinous processes without bony erosion.   Plan: OR today for I&D   Jerre Simon, [facility] 01/12/2019, 12:04 PM Pager: [phone] Consults: [phone] Mon-Fri 7:00 am-4:30 pm Sat-Sun 7:00 am-11:30 am

## 2019-01-12 NOTE — Progress Notes (Signed)
 Initial Nutrition Assessment  DOCUMENTATION CODES:   Non-severe (moderate) malnutrition in context of chronic illness  INTERVENTION:   Tube Feeding: Change to Vital AF 1.2 at 20 ml/hr/ Goal: Vital AF 1.2 at 75 ml/hr Provide 135 g of protein, 2160 kcals and 1458 mL of free water Goal meets 100% estimated protein needs, 99% calorie needs   NUTRITION DIAGNOSIS:   Moderate Malnutrition related to chronic illness(poorly controlled DM, EtOH abuse, abscess) as evidenced by percent weight loss, mild muscle depletion.  GOAL:   Patient will meet greater than or equal to 90% of their needs  MONITOR:   TF tolerance, Vent status, Labs, Weight trends  REASON FOR ASSESSMENT:   Ventilator    ASSESSMENT:   63 yo male with hx of uncontrolled DM and EtOH abuse presents with weakness and falls. Pt admitted with DKA, sepsis with MSSA bacteremia and abscess on back, AMS with EtOH withdrawal and possible benzo OD and aspiration requiring intubation.  OR today for drainage of abscess; posterior neck/upper back abscess measuring 11x5x6 cm per CT  Patient is currently intubated on ventilator support MV: 13.7 L/min Temp (24hrs), Avg:99.9 F (37.7 C), Min:98.1 F (36.7 C), Max:102 F (38.9 C)  Propofol: NONE  OG with bilious output; abdomen soft, obese. +diarrhea with rectal tube in place  Current wt 88.2 kg; admission weight 85.3 kg. Net + 8.5 L per I/O flow sheet. Per weight encounters, pt with 30% wt loss in the past year. Pt with edema present on exam.  Pt with documented weight of 123.4 kg in August of 2019. No other weight encounters available  Labs: sodium 134, Creatinine wdl, corrected calcium 8.9, albumin 1.3 Meds: insulin drip, fentanyl, D5-NS at 100 ml/hr, ss novolog, levemir, MVI with minerals, thiamine   NUTRITION - FOCUSED PHYSICAL EXAM:    Most Recent Value  Orbital Region  No depletion  Upper Arm Region  Unable to assess [edema]  Thoracic and Lumbar Region  No  depletion  Buccal Region  Unable to assess  Temple Region  Mild depletion  Clavicle Bone Region  Mild depletion  Clavicle and Acromion Bone Region  Mild depletion  Scapular Bone Region  Mild depletion  Dorsal Hand  Unable to assess  Patellar Region  Mild depletion  Anterior Thigh Region  Mild depletion  Posterior Calf Region  Mild depletion  Edema (RD Assessment)  Moderate       Diet Order:   Diet Order    None      EDUCATION NEEDS:   Not appropriate for education at this time  Skin:  Skin Assessment: Skin Integrity Issues: Skin Integrity Issues:: Other (Comment) Other: abscess to posteror neck/upper back 11x5x6 cm; MASD to buttock  Last BM:  7/20  Height:   Ht Readings from Last 1 Encounters:  01/12/19 5\' 9"  (1.753 m)    Weight:   Wt Readings from Last 1 Encounters:  01/12/19 88.2 kg    BMI:  Body mass index is 28.71 kg/m.  Estimated Nutritional Needs:   Kcal:  2183 kcals  Protein:  125-155 g  Fluid:  >/= 2 L    Shantale Holtmeyer MS, RDN, LDN, CNSC [phone] Pager  [phone] Weekend/On-Call Pager

## 2019-01-13 ENCOUNTER — Encounter (HOSPITAL_COMMUNITY)
Admission: EM | Disposition: A | Discharge status: Home Health | Payer: Self-pay | Source: Home / Self Care | Attending: Internal Medicine

## 2019-01-13 ENCOUNTER — Inpatient Hospital Stay (HOSPITAL_COMMUNITY): Payer: Medicaid Other

## 2019-01-13 ENCOUNTER — Inpatient Hospital Stay (HOSPITAL_COMMUNITY): Payer: Medicaid Other | Admitting: Certified Registered Nurse Anesthetist

## 2019-01-13 ENCOUNTER — Encounter (HOSPITAL_COMMUNITY): Payer: Self-pay | Admitting: Certified Registered"

## 2019-01-13 DIAGNOSIS — L02212 Cutaneous abscess of back [any part, except buttock]: Secondary | ICD-10-CM

## 2019-01-13 DIAGNOSIS — J690 Pneumonitis due to inhalation of food and vomit: Secondary | ICD-10-CM

## 2019-01-13 HISTORY — PX: IRRIGATION AND DEBRIDEMENT ABSCESS: SHX5252

## 2019-01-13 LAB — CBC
HCT: 31.4 % — ABNORMAL LOW (ref 39.0–52.0)
Hemoglobin: 10.5 g/dL — ABNORMAL LOW (ref 13.0–17.0)
MCH: 32.2 pg (ref 26.0–34.0)
MCHC: 33.4 g/dL (ref 30.0–36.0)
MCV: 96.3 fL (ref 80.0–100.0)
Platelets: 249 10*3/uL (ref 150–400)
RBC: 3.26 MIL/uL — ABNORMAL LOW (ref 4.22–5.81)
RDW: 13 % (ref 11.5–15.5)
WBC: 15.3 10*3/uL — ABNORMAL HIGH (ref 4.0–10.5)
nRBC: 0 % (ref 0.0–0.2)

## 2019-01-13 LAB — PHOSPHORUS: Phosphorus: 3.3 mg/dL (ref 2.5–4.6)

## 2019-01-13 LAB — GLUCOSE, CAPILLARY
Glucose-Capillary: 148 mg/dL — ABNORMAL HIGH (ref 70–99)
Glucose-Capillary: 158 mg/dL — ABNORMAL HIGH (ref 70–99)
Glucose-Capillary: 156 mg/dL — ABNORMAL HIGH (ref 70–99)
Glucose-Capillary: 167 mg/dL — ABNORMAL HIGH (ref 70–99)
Glucose-Capillary: 209 mg/dL — ABNORMAL HIGH (ref 70–99)

## 2019-01-13 LAB — MAGNESIUM: Magnesium: 1.9 mg/dL (ref 1.7–2.4)

## 2019-01-13 LAB — BASIC METABOLIC PANEL
Anion gap: 8 (ref 5–15)
BUN: 13 mg/dL (ref 8–23)
CO2: 19 mmol/L — ABNORMAL LOW (ref 22–32)
Calcium: 6.5 mg/dL — ABNORMAL LOW (ref 8.9–10.3)
Chloride: 109 mmol/L (ref 98–111)
Creatinine, Ser: 0.76 mg/dL (ref 0.61–1.24)
GFR calc Af Amer: >60 mL/min (ref >60)
GFR calc non Af Amer: >60 mL/min (ref >60)
Glucose, Bld: 167 mg/dL — ABNORMAL HIGH (ref 70–99)
Potassium: 3.7 mmol/L (ref 3.5–5.1)
Sodium: 136 mmol/L (ref 135–145)

## 2019-01-13 LAB — PROTIME-INR
INR: 1.5 — ABNORMAL HIGH (ref 0.8–1.2)
Prothrombin Time: 17.9 seconds — ABNORMAL HIGH (ref 11.4–15.2)

## 2019-01-13 LAB — CALCIUM, IONIZED: Calcium, Ionized, Serum: 4.3 mg/dL — ABNORMAL LOW (ref 4.5–5.6)

## 2019-01-13 LAB — SURGICAL PCR SCREEN
MRSA, PCR: NEGATIVE
Staphylococcus aureus: POSITIVE — AB

## 2019-01-13 SURGERY — IRRIGATION AND DEBRIDEMENT ABSCESS
Anesthesia: General | Site: Back

## 2019-01-13 MED ORDER — EPHEDRINE SULFATE-NACL 50-0.9 MG/10ML-% IV SOSY
PREFILLED_SYRINGE | INTRAVENOUS | Status: DC | PRN
  Administered 2019-01-13: 15 mg via INTRAVENOUS
  Administered 2019-01-13: 10 mg via INTRAVENOUS

## 2019-01-13 MED ORDER — MUPIROCIN 2 % EX OINT
1.0000 "application " | TOPICAL_OINTMENT | Freq: Two times a day (BID) | CUTANEOUS | Status: DC
  Administered 2019-01-13 – 2019-01-17 (×9): 1 via NASAL
  Filled 2019-01-13: qty 22

## 2019-01-13 MED ORDER — LACTATED RINGERS IV SOLN
INTRAVENOUS | Status: DC | PRN
  Administered 2019-01-13: 12:00:00 via INTRAVENOUS

## 2019-01-13 MED ORDER — VITAMIN K1 10 MG/ML IJ SOLN
2.5000 mg | Freq: Once | INTRAVENOUS | Status: AC
  Administered 2019-01-13: 09:00:00 2.5 mg via INTRAVENOUS
  Filled 2019-01-13: qty 0.25

## 2019-01-13 MED ORDER — MIDAZOLAM HCL 5 MG/5ML IJ SOLN
INTRAMUSCULAR | Status: DC | PRN
  Administered 2019-01-13 (×2): 2 mg via INTRAVENOUS

## 2019-01-13 MED ORDER — FENTANYL CITRATE (PF) 250 MCG/5ML IJ SOLN
INTRAMUSCULAR | Status: AC
  Filled 2019-01-13: qty 5

## 2019-01-13 MED ORDER — SODIUM CHLORIDE 0.9 % IR SOLN
Status: DC | PRN
  Administered 2019-01-13: 3000 mL

## 2019-01-13 MED ORDER — BUPIVACAINE-EPINEPHRINE (PF) 0.25% -1:200000 IJ SOLN
INTRAMUSCULAR | Status: AC
  Filled 2019-01-13: qty 30

## 2019-01-13 MED ORDER — ONDANSETRON HCL 4 MG/2ML IJ SOLN
INTRAMUSCULAR | Status: AC
  Filled 2019-01-13: qty 2

## 2019-01-13 MED ORDER — KETOROLAC TROMETHAMINE 30 MG/ML IJ SOLN
INTRAMUSCULAR | Status: AC
  Filled 2019-01-13: qty 1

## 2019-01-13 MED ORDER — MIDAZOLAM HCL 2 MG/2ML IJ SOLN
INTRAMUSCULAR | Status: AC
  Filled 2019-01-13: qty 2

## 2019-01-13 MED ORDER — ONDANSETRON HCL 4 MG/2ML IJ SOLN
INTRAMUSCULAR | Status: DC | PRN
  Administered 2019-01-13: 4 mg via INTRAVENOUS

## 2019-01-13 MED ORDER — MUPIROCIN 2 % EX OINT
1.0000 "application " | TOPICAL_OINTMENT | Freq: Two times a day (BID) | CUTANEOUS | Status: DC
  Administered 2019-01-13: 1 via NASAL
  Filled 2019-01-13: qty 22

## 2019-01-13 MED ORDER — CHLORHEXIDINE GLUCONATE CLOTH 2 % EX PADS: 6.0000 | MEDICATED_PAD | Freq: Every day | CUTANEOUS | Status: DC

## 2019-01-13 MED ORDER — ROCURONIUM BROMIDE 10 MG/ML (PF) SYRINGE
PREFILLED_SYRINGE | INTRAVENOUS | Status: DC | PRN
  Administered 2019-01-13: 50 mg via INTRAVENOUS

## 2019-01-13 MED ORDER — PROPOFOL 10 MG/ML IV BOLUS
INTRAVENOUS | Status: DC | PRN
  Administered 2019-01-13: 20 mg via INTRAVENOUS

## 2019-01-13 MED ORDER — LACTATED RINGERS IV BOLUS
1000.0000 mL | Freq: Once | INTRAVENOUS | Status: AC
  Administered 2019-01-13: 1000 mL via INTRAVENOUS

## 2019-01-13 MED ORDER — DEXAMETHASONE SODIUM PHOSPHATE 10 MG/ML IJ SOLN
INTRAMUSCULAR | Status: DC | PRN
  Administered 2019-01-13: 10 mg via INTRAVENOUS

## 2019-01-13 SURGICAL SUPPLY — 28 items
BLADE CLIPPER SURG (BLADE) IMPLANT
BNDG GAUZE ELAST 4 BULKY (GAUZE/BANDAGES/DRESSINGS) ×2 IMPLANT
CANISTER SUCT 3000ML PPV (MISCELLANEOUS) ×3 IMPLANT
COVER SURGICAL LIGHT HANDLE (MISCELLANEOUS) ×3 IMPLANT
COVER WAND RF STERILE (DRAPES) ×3 IMPLANT
DRSG PAD ABDOMINAL 8X10 ST (GAUZE/BANDAGES/DRESSINGS) ×4 IMPLANT
ELECT CAUTERY BLADE 6.4 (BLADE) ×2 IMPLANT
ELECT REM PT RETURN 9FT ADLT (ELECTROSURGICAL) ×3
ELECTRODE REM PT RTRN 9FT ADLT (ELECTROSURGICAL) ×1 IMPLANT
GAUZE SPONGE 4X4 12PLY STRL (GAUZE/BANDAGES/DRESSINGS) ×2 IMPLANT
GLOVE BIO SURGEON STRL SZ7 (GLOVE) ×3 IMPLANT
GLOVE BIOGEL PI IND STRL 7.5 (GLOVE) ×1 IMPLANT
GLOVE BIOGEL PI INDICATOR 7.5 (GLOVE) ×2
GOWN STRL REUS W/ TWL LRG LVL3 (GOWN DISPOSABLE) ×2 IMPLANT
GOWN STRL REUS W/TWL LRG LVL3 (GOWN DISPOSABLE) ×4
HANDPIECE INTERPULSE COAX TIP (DISPOSABLE) ×2
KIT BASIN OR (CUSTOM PROCEDURE TRAY) ×3 IMPLANT
KIT TURNOVER KIT B (KITS) ×3 IMPLANT
NS IRRIG 1000ML POUR BTL (IV SOLUTION) ×3 IMPLANT
PACK GENERAL/GYN (CUSTOM PROCEDURE TRAY) ×3 IMPLANT
PAD ARMBOARD 7.5X6 YLW CONV (MISCELLANEOUS) ×3 IMPLANT
PENCIL SMOKE EVACUATOR (MISCELLANEOUS) ×1 IMPLANT
SET HNDPC FAN SPRY TIP SCT (DISPOSABLE) IMPLANT
SWAB COLLECTION DEVICE MRSA (MISCELLANEOUS) IMPLANT
SWAB CULTURE ESWAB REG 1ML (MISCELLANEOUS) IMPLANT
TAPE CLOTH SURG 6X10 WHT LF (GAUZE/BANDAGES/DRESSINGS) ×2 IMPLANT
TOWEL GREEN STERILE (TOWEL DISPOSABLE) ×3 IMPLANT
TOWEL GREEN STERILE FF (TOWEL DISPOSABLE) ×3 IMPLANT

## 2019-01-13 NOTE — Progress Notes (Signed)
[facility]   Reason for visit: Follow up on MSSA bacteremia  Interval History: Patient febrile yesterday to 103.8. Patient continuing on Unasyn for MSSA and aspiration pneumonia coverage. WBC 15.3 this morning, up from 11.2. Plans for TEE and I&D today.  Subjective: Unable to be obtained, patient intubated.  Physical Exam: Constitutional: Patient intubated, somnolent Vitals:   01/13/19 0800 01/13/19 0900  BP: 109/70 107/63  Pulse: (!) 54 (!) 51  Resp: 16 12  Temp: 97.6 F (36.4 C)   SpO2: 100% 100%  Respiratory: CTAB Cardiovascular: RRR, no m/r/g GI: Soft, non-distended  Review of Systems: Unable to be obtained, patient intubated  Lab Results  Component Value Date   WBC 15.3 (H) 01/13/2019   HGB 10.5 (L) 01/13/2019   HCT 31.4 (L) 01/13/2019   MCV 96.3 01/13/2019   PLT 249 01/13/2019    Lab Results  Component Value Date   CREATININE 0.76 01/13/2019   BUN 13 01/13/2019   NA 136 01/13/2019   K 3.7 01/13/2019   CL 109 01/13/2019   CO2 19 (L) 01/13/2019    Lab Results  Component Value Date   ALT 34 01/12/2019   AST 73 (H) 01/12/2019   ALKPHOS 128 (H) 01/12/2019     Microbiology: Recent Results (from the past 240 hour(s))  Culture, blood (Routine x 2)     Status: Abnormal   Collection Time: 01/07/19 11:05 AM   Specimen: BLOOD  Result Value Ref Range Status   Specimen Description BLOOD BLOOD RIGHT FOREARM  Final   Special Requests   Final    BOTTLES DRAWN AEROBIC AND ANAEROBIC Blood Culture adequate volume   Culture  Setup Time   Final    GRAM POSITIVE COCCI IN CLUSTERS IN BOTH AEROBIC AND ANAEROBIC BOTTLES CRITICAL RESULT CALLED TO, READ BACK BY AND VERIFIED WITH: Antoine PrimasE. Martin PharmD 10:15 01/08/19 ([email or web address]) Performed at [facility], [address] [place], [place] [address]    Culture STAPHYLOCOCCUS AUREUS (A)  Final   Report Status 01/10/2019 FINAL  Final   Organism ID, Bacteria STAPHYLOCOCCUS AUREUS  Final   Susceptibility   Staphylococcus aureus - MIC*    CIPROFLOXACIN >=8 RESISTANT Resistant     ERYTHROMYCIN >=8 RESISTANT Resistant     GENTAMICIN <=0.5 SENSITIVE Sensitive     OXACILLIN 0.5 SENSITIVE Sensitive     TETRACYCLINE <=1 SENSITIVE Sensitive     VANCOMYCIN 1 SENSITIVE Sensitive     TRIMETH/SULFA <=10 SENSITIVE Sensitive     CLINDAMYCIN >=8 RESISTANT Resistant     RIFAMPIN <=0.5 SENSITIVE Sensitive     Inducible Clindamycin NEGATIVE Sensitive     * STAPHYLOCOCCUS AUREUS  Blood Culture ID Panel (Reflexed)     Status: Abnormal   Collection Time: 01/07/19 11:05 AM  Result Value Ref Range Status   Enterococcus species NOT DETECTED NOT DETECTED Final   Listeria monocytogenes NOT DETECTED NOT DETECTED Final   Staphylococcus species DETECTED (A) NOT DETECTED Final    Comment: CRITICAL RESULT CALLED TO, READ BACK BY AND VERIFIED WITH: Antoine PrimasE. Martin PharmD 10:15 01/08/19 ([email or web address])    Staphylococcus aureus (BCID) DETECTED (A) NOT DETECTED Final    Comment: Methicillin (oxacillin) susceptible Staphylococcus aureus (MSSA). Preferred therapy is anti staphylococcal beta lactam antibiotic (Cefazolin or Nafcillin), unless clinically contraindicated. CRITICAL RESULT CALLED TO, READ BACK BY AND VERIFIED WITH: Antoine PrimasE. Martin PharmD 10:15 01/08/19 ([email or web address])    Methicillin resistance NOT DETECTED NOT DETECTED Final   Streptococcus species NOT DETECTED NOT DETECTED  Final   Streptococcus agalactiae NOT DETECTED NOT DETECTED Final   Streptococcus pneumoniae NOT DETECTED NOT DETECTED Final   Streptococcus pyogenes NOT DETECTED NOT DETECTED Final   Acinetobacter baumannii NOT DETECTED NOT DETECTED Final   Enterobacteriaceae species NOT DETECTED NOT DETECTED Final   Enterobacter cloacae complex NOT DETECTED NOT DETECTED Final   Escherichia coli NOT DETECTED NOT DETECTED Final   Klebsiella oxytoca NOT DETECTED NOT DETECTED Final   Klebsiella pneumoniae NOT DETECTED NOT DETECTED Final   Proteus species NOT  DETECTED NOT DETECTED Final   Serratia marcescens NOT DETECTED NOT DETECTED Final   Haemophilus influenzae NOT DETECTED NOT DETECTED Final   Neisseria meningitidis NOT DETECTED NOT DETECTED Final   Pseudomonas aeruginosa NOT DETECTED NOT DETECTED Final   Candida albicans NOT DETECTED NOT DETECTED Final   Candida glabrata NOT DETECTED NOT DETECTED Final   Candida krusei NOT DETECTED NOT DETECTED Final   Candida parapsilosis NOT DETECTED NOT DETECTED Final   Candida tropicalis NOT DETECTED NOT DETECTED Final    Comment: Performed at [facility], [address] [place], [place] [address]  Culture, blood (Routine x 2)     Status: Abnormal   Collection Time: 01/07/19  5:25 PM   Specimen: BLOOD LEFT ARM  Result Value Ref Range Status   Specimen Description BLOOD LEFT ARM  Final   Special Requests   Final    BOTTLES DRAWN AEROBIC AND ANAEROBIC Blood Culture results may not be optimal due to an inadequate volume of blood received in culture bottles   Culture  Setup Time   Final    GRAM POSITIVE COCCI AEROBIC BOTTLE ONLY CRITICAL RESULT CALLED TO, READ BACK BY AND VERIFIED WITH: PHARMD E MARTIN [phone] AT 1107 AM BY CM    Culture (A)  Final    STAPHYLOCOCCUS AUREUS SUSCEPTIBILITIES PERFORMED ON PREVIOUS CULTURE WITHIN THE LAST 5 DAYS. Performed at [facility], [address] [place], [place] [address]    Report Status 01/10/2019 FINAL  Final  SARS Coronavirus 2 (CEPHEID - Performed in [facility] hospital lab), Hosp Order     Status: None   Collection Time: 01/07/19  5:35 PM   Specimen: Nasopharyngeal Swab  Result Value Ref Range Status   SARS Coronavirus 2 NEGATIVE NEGATIVE Final    Comment: (NOTE) If result is NEGATIVE SARS-CoV-2 target nucleic acids are NOT DETECTED. The SARS-CoV-2 RNA is generally detectable in upper and lower  respiratory specimens during the acute phase of infection. The lowest  concentration of SARS-CoV-2 viral copies this assay can detect is 250   copies / mL. A negative result does not preclude SARS-CoV-2 infection  and should not be used as the sole basis for treatment or other  patient management decisions.  A negative result may occur with  improper specimen collection / handling, submission of specimen other  than nasopharyngeal swab, presence of viral mutation(s) within the  areas targeted by this assay, and inadequate number of viral copies  (<250 copies / mL). A negative result must be combined with clinical  observations, patient history, and epidemiological information. If result is POSITIVE SARS-CoV-2 target nucleic acids are DETECTED. The SARS-CoV-2 RNA is generally detectable in upper and lower  respiratory specimens dur ing the acute phase of infection.  Positive  results are indicative of active infection with SARS-CoV-2.  Clinical  correlation with patient history and other diagnostic information is  necessary to determine patient infection status.  Positive results do  not rule out  bacterial infection or co-infection with other viruses. If result is PRESUMPTIVE POSTIVE SARS-CoV-2 nucleic acids MAY BE PRESENT.   A presumptive positive result was obtained on the submitted specimen  and confirmed on repeat testing.  While 2019 novel coronavirus  (SARS-CoV-2) nucleic acids may be present in the submitted sample  additional confirmatory testing may be necessary for epidemiological  and / or clinical management purposes  to differentiate between  SARS-CoV-2 and other Sarbecovirus currently known to infect humans.  If clinically indicated additional testing with an alternate test  methodology [phone]) is advised. The SARS-CoV-2 RNA is generally  detectable in upper and lower respiratory sp ecimens during the acute  phase of infection. The expected result is Negative. Fact Sheet for Patients:  StrictlyIdeas.no Fact Sheet for Healthcare Providers: BankingDealers.co.za  This test is not yet approved or cleared by the [place] FDA and has been authorized for detection and/or diagnosis of SARS-CoV-2 by FDA under an Emergency Use Authorization (EUA).  This EUA will remain in effect (meaning this test can be used) for the duration of the COVID-19 declaration under Section 564(b)(1) of the Act, 21 U.S.C. section 360bbb-3(b)(1), unless the authorization is terminated or revoked sooner. Performed at [facility], [place] [address] [place], [place] [address]   Urine culture     Status: Abnormal   Collection Time: 01/07/19  6:22 PM   Specimen: In/Out Cath Urine  Result Value Ref Range Status   Specimen Description IN/OUT CATH URINE  Final   Special Requests NONE  Final   Culture (A)  Final    40,000 COLONIES/mL STAPHYLOCOCCUS AUREUS 20,000 COLONIES/mL STAPHYLOCOCCUS SPECIES (COAGULASE NEGATIVE) 1,000 COLONIES/mL VIRIDANS STREPTOCOCCUS Standardized susceptibility testing for this organism is not available. Performed at [facility], [place] [address], [place], [place] [address]    Report Status 01/10/2019 FINAL  Final   Organism ID, Bacteria STAPHYLOCOCCUS AUREUS (A)  Final   Organism ID, Bacteria STAPHYLOCOCCUS SPECIES (COAGULASE NEGATIVE) (A)  Final      Susceptibility   Staphylococcus aureus - MIC*    CIPROFLOXACIN >=8 RESISTANT Resistant     GENTAMICIN <=0.5 SENSITIVE Sensitive     NITROFURANTOIN <=16 SENSITIVE Sensitive     OXACILLIN 0.5 SENSITIVE Sensitive     TETRACYCLINE <=1 SENSITIVE Sensitive     VANCOMYCIN 1 SENSITIVE Sensitive     TRIMETH/SULFA <=10 SENSITIVE Sensitive     CLINDAMYCIN <=0.25 SENSITIVE Sensitive     RIFAMPIN <=0.5 SENSITIVE Sensitive     Inducible Clindamycin NEGATIVE Sensitive     * 40,000 COLONIES/mL STAPHYLOCOCCUS AUREUS   Staphylococcus species (coagulase negative) - MIC*    CIPROFLOXACIN <=0.5 SENSITIVE Sensitive     GENTAMICIN <=0.5 SENSITIVE Sensitive     NITROFURANTOIN 32 SENSITIVE Sensitive      OXACILLIN <=0.25 SENSITIVE Sensitive     TETRACYCLINE <=1 SENSITIVE Sensitive     VANCOMYCIN <=0.5 SENSITIVE Sensitive     TRIMETH/SULFA <=10 SENSITIVE Sensitive     CLINDAMYCIN <=0.25 SENSITIVE Sensitive     RIFAMPIN <=0.5 SENSITIVE Sensitive     Inducible Clindamycin NEGATIVE Sensitive     * 20,000 COLONIES/mL STAPHYLOCOCCUS SPECIES (COAGULASE NEGATIVE)  Culture, blood (routine x 2)     Status: None (Preliminary result)   Collection Time: 01/09/19  8:25 AM   Specimen: BLOOD LEFT HAND  Result Value Ref Range Status   Specimen Description BLOOD LEFT HAND  Final   Special Requests   Final    BOTTLES DRAWN AEROBIC ONLY Blood Culture adequate volume  Culture   Final    NO GROWTH 4 DAYS Performed at [facility], [address] [place], [place] [address]    Report Status PENDING  Incomplete  Culture, blood (routine x 2)     Status: None (Preliminary result)   Collection Time: 01/09/19  8:26 AM   Specimen: BLOOD LEFT HAND  Result Value Ref Range Status   Specimen Description BLOOD LEFT HAND  Final   Special Requests   Final    BOTTLES DRAWN AEROBIC ONLY Blood Culture adequate volume   Culture   Final    NO GROWTH 4 DAYS Performed at [facility], [address], [place], [place] [address]    Report Status PENDING  Incomplete  Culture, Urine     Status: None   Collection Time: 01/10/19 11:24 AM   Specimen: Urine, Random  Result Value Ref Range Status   Specimen Description URINE, RANDOM  Final   Special Requests NONE  Final   Culture   Final    NO GROWTH Performed at [facility], [address], [place], [place] [address]    Report Status 01/11/2019 FINAL  Final  C difficile quick scan w PCR reflex     Status: None   Collection Time: 01/11/19  3:44 PM   Specimen: STOOL  Result Value Ref Range Status   C Diff antigen NEGATIVE NEGATIVE Final   C Diff toxin NEGATIVE NEGATIVE Final   C Diff interpretation No C. difficile detected.  Final    Comment:  Performed at [facility], [address] [place], [place] [address]  MRSA PCR Screening     Status: None   Collection Time: 01/11/19 11:42 PM   Specimen: Nasal Mucosa; Nasopharyngeal  Result Value Ref Range Status   MRSA by PCR NEGATIVE NEGATIVE Final    Comment:        The GeneXpert MRSA Assay (FDA approved for NASAL specimens only), is one component of a comprehensive MRSA colonization surveillance program. It is not intended to diagnose MRSA infection nor to guide or monitor treatment for MRSA infections. Performed at [facility], [address], [place], [place] [address]   Culture, blood (routine x 2)     Status: None (Preliminary result)   Collection Time: 01/12/19  1:43 AM   Specimen: BLOOD LEFT HAND  Result Value Ref Range Status   Specimen Description BLOOD LEFT HAND  Final   Special Requests   Final    BOTTLES DRAWN AEROBIC ONLY Blood Culture results may not be optimal due to an inadequate volume of blood received in culture bottles   Culture   Final    NO GROWTH 1 DAY Performed at [facility], [address], [place], [place] [address]    Report Status PENDING  Incomplete  Culture, blood (routine x 2)     Status: None (Preliminary result)   Collection Time: 01/12/19  1:50 AM   Specimen: BLOOD RIGHT HAND  Result Value Ref Range Status   Specimen Description BLOOD RIGHT HAND  Final   Special Requests   Final    BOTTLES DRAWN AEROBIC ONLY Blood Culture adequate volume   Culture   Final    NO GROWTH 1 DAY Performed at [facility], [address] [place], [place] [address]    Report Status PENDING  Incomplete  Culture, respiratory (non-expectorated)     Status: None (Preliminary result)   Collection Time: 01/12/19 12:47  PM   Specimen: Tracheal Aspirate; Respiratory  Result Value Ref Range Status   Specimen Description TRACHEAL ASPIRATE  Final   Special Requests NONE  Final   Gram Stain   Final    RARE WBC PRESENT, PREDOMINANTLY PMN  NO ORGANISMS SEEN    Culture   Final    NO GROWTH < 24 HOURS Performed at [facility], [address] [place], [place] [address]    Report Status PENDING  Incomplete    Impression:  63 yo male with PMH of uncontrolled DM who presented with weakness, fever, tachycardia, elevated lactic acid consistent with sepsis and found to have MSSA bacteremia. Continued febrile episodes may be 2/2 abscess on upper back.  Plan: 1. Continue on Unasyn for MSSA bacteremia and possible aspiration pneumonia. Patient without evidence of penicillin allergy. Can narrow back to ancef following treatment for aspiration pneumonia - after 7 day course.  2. Plan for I&D per surgery today 3. Patient needs TEE to evaluate for endocarditis. 4. Ok for PICC line

## 2019-01-13 NOTE — Progress Notes (Signed)
RT note: patient meets SBT criteria this AM however due to patient scheduled for OR at 12:15pm will hold until after procedure.  Tolerating current ventilator settings well.  Will continue to monitor.

## 2019-01-13 NOTE — Progress Notes (Addendum)
NAME:  Frederick SizerRobert Moody, MRN:  [id number], DOB:  August 26, 1955, LOS: 6 ADMISSION DATE:  01/07/2019, CONSULTATION DATE:  01/11/19 CHIEF COMPLAINT:  Encephaolpathy   Brief History   This is a 63 yo with history of uncontrolled DM and etoh abuse who presents for weakness and falls. Found to have etoh withdrawal and DKA (resolved) and MSSA bacteremia likely source is abscess on upper back. Patient was admitted to CCM after being unable to protect his airway, thought to be due to excess benzodiazepenes with his withdrawal.   Past Medical History  Poorly controlled DM, ETOH abuse  Principal Problem:   MSSA bacteremia Active Problems:   DKA (diabetic ketoacidoses) (HCC)   Alcohol withdrawal (HCC)   Aspiration pneumonia (HCC)   Sepsis (HCC)   Acute encephalopathy   Acute respiratory failure (HCC)   Malnutrition of moderate degree   Significant Hospital Events   01/07/19 - admitted for MSSA positive  01/11/19 - intubated and transferred to MICU 01/12/19 - extubated 01/12/19 - OR for I & D of upper back  01/12/19 - TEE   Consults:  ID  Surgery  Nutrition  Cardiology   Procedures:  01/11/2019-intubation 01/11/19 - Central Line 7/20 > OR for abscess drain   Significant Diagnostic Tests:  7/20 MRI Elbow  7/20 Ab XR Gastric tube courses into the stomach with its tip in the antropyloric region, appropriately positioned.  Ct Head Wo Contrast 7/20 CT Head w/o - no acute findings, remote infarcts, chronic axillary sinusitis 7/20 CT chest W/O-moderate layering pleural effusions with multisegmental atelectasis.  Dorsal cervical thoracic soft tissue inflammation 7/20/CT spine 11 x 5 x 6 cm abscess in the dorsal cervicothoracic soft tissues extending from the skin surface into the superficial musculature. There is extension to spinous processes without bony erosion. 7/21 CXR  Bilateral lower lobe airspace opacities and probable small layering effusions. Heart is borderline in size. No acute bony  abnormality. IMPRESSION: Bilateral lower lobe airspace opacities with layering effusions. No significant change since prior study.  7/20 CXR  No evidence of pneumothorax following central line placement. Bilateral pleural effusions over the bases.  7/19 CXR  Lines and tubes as above. Persistent bilateral airspace opacities similar to prior study. Bilateral pleural effusions with short interval increase in size.  7/19 CXR . IMPRESSION: Worsening multifocal airspace opacities.  7/18 CXR IMPRESSION: 1. New patchy airspace opacities in the right mid lung, right base and left retrocardiac region. Differential considerations include multifocal pneumonia, aspiration pneumonitis, and, in the appropriate clinical setting, atypical/viral pneumonia. 2. Cardiomegaly with mild vascular congestion but no overt pulmonary edema.  7/17 TTE -normal systolic function with EF 55 to 60%.  Left atrium mildly dilated.  Mild mitral annular calcification present.  No stenosis of aortic valve but with mild thickening and calcification.  [place] Lt Upper Extrem Ltd Soft Tissue Non Vascular Result Date: 01/09/2019 1. Focal area of soft tissue edema with surrounding Doppler flow in the medial aspect of left elbow which appears to be centered in the flexor musculature concerning for myositis. If there is further clinical concern regarding septic arthritis, recommend an MRI of the left elbow. Nile Riggs.   Micro Data:  01/07/19 - blood culture >> + MSSA (Resistant to cipro, clinda, erythro) 01/07/19 - Urine cx >> + staph aureus pan senstive (Resistant to cipro)  01/09/19 - Blood culture >> no growth x4 days 01/10/19 - Urine Culture >>  01/12/19 - Blood culture >> no growth x1 day C. difficile PCR negative MRSA negative Antimicrobials:  Anti-infectives (From admission, onward)   Start     Dose/Rate Route Frequency Ordered Stop   01/12/19 1400  Ampicillin-Sulbactam (UNASYN) 3 g in sodium chloride 0.9 % 100 mL IVPB     3 g 200 mL/hr over 30  Minutes Intravenous Every 6 hours 01/12/19 1101     01/12/19 1000  vancomycin (VANCOCIN) 1,250 mg in sodium chloride 0.9 % 250 mL IVPB  Status:  Discontinued     1,250 mg 166.7 mL/hr over 90 Minutes Intravenous Every 12 hours 01/12/19 0115 01/12/19 1101   01/12/19 0115  vancomycin (VANCOCIN) 1,500 mg in sodium chloride 0.9 % 500 mL IVPB     1,500 mg 250 mL/hr over 120 Minutes Intravenous  Once 01/12/19 0100 01/12/19 0514   01/10/19 1230  meropenem (MERREM) 1 g in sodium chloride 0.9 % 100 mL IVPB  Status:  Discontinued     1 g 200 mL/hr over 30 Minutes Intravenous Every 8 hours 01/10/19 1146 01/12/19 1101   01/08/19 2200  ceFAZolin (ANCEF) IVPB 2g/100 mL premix  Status:  Discontinued     2 g 200 mL/hr over 30 Minutes Intravenous Every 8 hours 01/08/19 1030 01/10/19 1146   01/08/19 1800  vancomycin (VANCOCIN) 1,500 mg in sodium chloride 0.9 % 500 mL IVPB  Status:  Discontinued     1,500 mg 250 mL/hr over 120 Minutes Intravenous Every 24 hours 01/07/19 1830 01/08/19 1030   01/08/19 0630  cefTRIAXone (ROCEPHIN) 2 g in sodium chloride 0.9 % 100 mL IVPB  Status:  Discontinued     2 g 200 mL/hr over 30 Minutes Intravenous Daily 01/08/19 0536 01/08/19 1030   01/08/19 0600  ceFEPIme (MAXIPIME) 2 g in sodium chloride 0.9 % 100 mL IVPB  Status:  Discontinued     2 g 200 mL/hr over 30 Minutes Intravenous Every 12 hours 01/07/19 1830 01/07/19 2132   01/07/19 1745  vancomycin (VANCOCIN) 2,000 mg in sodium chloride 0.9 % 500 mL IVPB     2,000 mg 250 mL/hr over 120 Minutes Intravenous  Once 01/07/19 1738 01/07/19 2013   01/07/19 1745  ceFEPIme (MAXIPIME) 2 g in sodium chloride 0.9 % 100 mL IVPB     2 g 200 mL/hr over 30 Minutes Intravenous  Once 01/07/19 1738 01/07/19 1904      Interim history/subjective:  Overnight, patient with T-max of 103.8 and agitation.  Patient provided with Motrin 400 mg per tube every 6 hours for temps greater than 101.  Additionally, fentanyl 50 mcg bolus every hour as  needed sedation.  Objective   Temp:  [97.6 F (36.4 C)-103.8 F (39.9 C)] 97.6 F (36.4 C) (07/21 0800) Pulse Rate:  [51-89] 63 (07/21 1121) Resp:  [12-31] 19 (07/21 1121) BP: (85-140)/(45-73) 97/45 (07/21 1121) SpO2:  [96 %-100 %] 100 % (07/21 1121) Arterial Line BP: (79-144)/(43-74) 117/51 (07/21 1102) FiO2 (%):  [40 %] 40 % (07/21 1121) Weight:  [92.7 kg] 92.7 kg (07/21 0500)  CVP:  [5 mmHg-16 mmHg] 16 mmHg  Vent Mode: PRVC FiO2 (%):  [40 %] 40 % Set Rate:  [16 bmp] 16 bmp Vt Set:  [560 mL] 560 mL PEEP:  [5 cmH20] 5 cmH20 Plateau Pressure:  [16 cmH20-17 cmH20] 17 cmH20  Intake/Output      07/20 0701 - 07/21 0700 07/21 0701 - 07/22 0700   P.O.     I.V. (mL/kg) 3419.1 (36.9)    Other     NG/GT 320    IV Piggyback 1679    Total  Intake(mL/kg) 5418.1 (58.4)    Urine (mL/kg/hr) 150 (0.1)    Emesis/NG output 300    Stool 0    Total Output 450    Net +4968.1         Stool Occurrence 2 x     No intake/output data recorded.  07/19 1901 - 07/21 0700 In: 9219.3 [I.V.:5435.1] Out: 1650 [Urine:1350]  Filed Weights   01/12/19 0000 01/12/19 0230 01/13/19 0500  Weight: 88.2 kg 88.2 kg 92.7 kg   Examination: General: Lying in bed intubated.  HENT: Eyes open and track.  Lungs: CTAB. Intubated. Taking spontaenous breaths.  Cardiovascular: RRR. No murmurs appreciated  Abdomen: + BS. NTND.  Extremities: Left arm with pitting edema on flexor surface  Resolved Hospital Problem list   DKA  Active Hospital Problems   Diagnosis Date Noted   MSSA bacteremia 01/08/2019   Malnutrition of moderate degree 01/12/2019   Acute encephalopathy    Acute respiratory failure (HCC)    Aspiration pneumonia (HCC) 01/10/2019   Sepsis (HCC)    Alcohol withdrawal (HCC) 01/08/2019   DKA (diabetic ketoacidoses) (HCC) 01/07/2019    Resolved Hospital Problems  No resolved problems to display.     Assessment & Plan:   ID MSSA Bacteremia likely due to soft tissue abscess on upper  back.  Patient to go to the OR today for soft tissue abscess as it appears close to spinous processes.  Patient continues to have fevers and white count.  Last lactic acid was 2.9.  Did not repeat lactic acid as this was expected as we do not have source control for infection.  Central line placed yesterday for long-term antibiotics.  TEE today to rule out vegetations.  Per ID, patient will need 14-day course following negative cultures provide no vegetations.  Patient continues to be on phenylephrine at 70 mcg/min. >>  Continue nail, Unasyn  >> follow-up TEE >> 1x Vit K for OR (INR 1.5) per surgery  MSK 1. Left elbow patient with ultrasound on day of admission (7/17) with finding concerning for myositis.  Recommendations for MRI.  Will obtain MRI later today after patient's TEE and OR.  PULM 1.  Acute hypoxic respiratory failure thought to be due to overmedication with benzodiazepines in setting of alcohol withdrawal.  Patient has been on vent for about 36 hours.  Chest x-ray this morning showed pulmonary edema that bases.  Patient is fluid up.  Patient to remain on vent until after surgery and TEE.  Will likely need to diurese prior to weaning vent.  2. Aspiration pneumonia presumed aspiration pneumonia given findings at intubation and continued fevers.  Patient currently on Unasyn. >> Patient will need swallow study once he is extubated as there was evidence of aspiration during intubation.  CARDS 1. AFIB, likely 2/2 acute illness patient has remained in sinus rhythm for 24 hours.  Will discontinue amiodarone at this time. 2. MSSA bacteremia currently on Unasyn.  TEE tomorrow to rule out any vegetations.  Additionally, source is likely abscess on back for which patient will go to the OR at noon today.  Infectious disease on board in the setting of bacteremia.  Will touch base after TEE to clarify antibiotic regimen pending vegetations.  GI 1. Diarrhea improving, 2 stools in the last 24 hours.   Replace fluids with maintenance IV fluids.  Continue to monitor. 2. Nutrition nutrition on board.  Currently on tube feeds vital AF 1.2 at 20 mL/h.  Goal is 75 mL/h. 3. Electrolytes within normal  limits >> Daily BMP with mag in the office  NEURO 1. ETOH WITHDRAWAL patient is awake this morning.  There was some agitation overnight.  Patient PRN lorazepam and fentanyl. Bps dropped with precedex yesterday. Continue with fentanyl.   ENDOCRINE 1. Diabetes -A1c 11.7.  Currently on detemir 20 units nightly with sliding scale every 4 hours  HEME 1. Anemia stable at baseline, 10.5  Best practice:  Diet: NPO Pain/Anxiety/Delirium protocol (if indicated): Place/Maintain arterial line **AND** sodium chloride, 0.9 % irrigation (POUR BTL), bupivacaine-epinephrine, fentaNYL, ibuprofen, LORazepam  VAP protocol (if indicated): 30 degrees HOB  DVT prophylaxis: LOVENOX GI prophylaxis: PTX Glucose control: detemir and ssI  Mobility: BR Code Status: Full  Family Communication: Sister Disposition: ICU   Labs   CBC: Recent Labs  Lab 01/07/19 1702  01/08/19 0228 01/09/19 0407 01/10/19 0433 01/11/19 0348 01/12/19 0117 01/13/19 0434  WBC 24.5*  --  18.9* 13.3* 10.6* 11.2*  --  15.3*  NEUTROABS 23.5*  --   --   --   --   --   --   --   HGB 14.5   < > 12.1* 11.7* 11.5* 11.6* 10.2* 10.5*  HCT 41.0   < > 34.1* 33.0* 32.6* 32.3* 30.0* 31.4*  MCV 93.8  --  93.2 92.4 92.4 92.6  --  96.3  PLT 277  --  207 156 129* 166  --  249   < > = values in this interval not displayed.    Basic Metabolic Panel: Recent Labs  Lab 01/09/19 0407  01/10/19 0433  01/11/19 0348  01/11/19 2041 01/12/19 0117 01/12/19 0143 01/12/19 0511 01/12/19 1005 01/12/19 1830 01/13/19 0434  NA 130*   < > 130*   < > 134*   < > 133* 139 135 134*  --  136 136  K 3.6   < > 3.6   < > 3.3*   < > 3.6 3.2* 4.1 4.4  --  3.6 3.7  CL 100   < > 102   < > 103   < > 103  --  106 105  --  109 109  CO2 22   < > 21*   < > 22   < > 21*  --   21* 20*  --  20* 19*  GLUCOSE 154*   < > 163*   < > 186*   < > 216*  --  146* 169*  --  112* 167*  BUN 8   < > 7*   < > 6*   < > <5*  --  <5* <5*  --  7* 13  CREATININE 0.55*   < > 0.52*   < > 0.54*   < > 0.55*  --  0.55* 0.63  --  0.80 0.76  CALCIUM 7.7*   < > 7.2*   < > 7.3*   < > 7.0*  --  6.9* 6.7*  --  6.8* 6.5*  MG 1.8  --  1.7  --  1.8  --   --   --   --   --  1.6*  --  1.9  PHOS 2.3*  --  2.6  --  2.6  --   --   --   --   --  1.8*  --  3.3   < > = values in this interval not displayed.   GFR: Estimated Creatinine Clearance: 106.3 mL/min (by C-G formula based on SCr of 0.76 mg/dL). Recent Labs  Lab  01/07/19 1902  01/09/19 0407 01/10/19 0433 01/11/19 0348 01/11/19 2041 01/12/19 0143 01/12/19 0511 01/13/19 0434  WBC  --    < > 13.3* 10.6* 11.2*  --   --   --  15.3*  LATICACIDVEN 1.5  --   --   --   --  1.4 2.1* 2.9*  --    < > = values in this interval not displayed.    Liver Function Tests: Recent Labs  Lab 01/07/19 1702 01/12/19 0143  AST 32 73*  ALT 39 34  ALKPHOS 127* 128*  BILITOT 1.3* 1.9*  PROT 7.4 5.5*  ALBUMIN 2.5* 1.3*   No results for input(s): LIPASE, AMYLASE in the last 168 hours. No results for input(s): AMMONIA in the last 168 hours.  ABG    Component Value Date/Time   PHART 7.406 01/12/2019 0117   PCO2ART 37.9 01/12/2019 0117   PO2ART 207.0 (H) 01/12/2019 0117   HCO3 23.7 01/12/2019 0117   TCO2 25 01/12/2019 0117   ACIDBASEDEF 1.0 01/12/2019 0117   O2SAT 100.0 01/12/2019 0117     Coagulation Profile: Recent Labs  Lab 01/13/19 0434  INR 1.5*    Cardiac Enzymes: Recent Labs  Lab 01/08/19 1401  CKTOTAL 120    HbA1C: Hgb A1c MFr Bld  Date/Time Value Ref Range Status  01/07/2019 08:28 PM 11.7 (H) 4.8 - 5.6 % Final    Comment:    (NOTE) Pre diabetes:          5.7%-6.4% Diabetes:              >6.4% Glycemic control for   <7.0% adults with diabetes     CBG: Recent Labs  Lab 01/12/19 1619 01/12/19 1947 01/12/19 2335  01/13/19 0338 01/13/19 0737  GLUCAP 91 108* 139* 148* 158*    Review of Systems:   Unable to perform given patient's mental status Past Medical History  He,  has a past medical history of Diabetes mellitus without complication (HCC) and ETOH abuse.   Surgical History    Past Surgical History:  Procedure Laterality Date   APPENDECTOMY       Social History   reports that he has never smoked. He has never used smokeless tobacco. He reports previous alcohol use. He reports that he does not use drugs.   Family History   His family history is not on file.   Allergies Allergies  Allergen Reactions   Penicillins Swelling and Rash    Neck became swollen Did it involve swelling of the face/tongue/throat, SOB, or low BP? Yes Did it involve sudden or severe rash/hives, skin peeling, or any reaction on the inside of your mouth or nose? Unk Did you need to seek medical attention at a hospital or doctor's office? Unk When did it last happen? Happened in the 1980's If all above answers are "NO", may proceed with cephalosporin use.      Home Medications  Prior to Admission medications   Medication Sig Start Date End Date Taking? Authorizing Provider  naproxen sodium (ALEVE) 220 MG tablet Take 220-440 mg by mouth 2 (two) times daily as needed (for pain).   Yes [provider]  metFORMIN (GLUCOPHAGE) 500 MG tablet Take 1 tablet (500 mg total) by mouth 2 (two) times daily with a meal. Patient not taking: Reported on 01/07/2019 02/19/18   Geoffery Lyonselo, Douglas, MD      Melene Planachel E. Kim, M.D.  PGY-2   Family Medicine  01/13/2019 8:25 AM -------------------------------------------------------------------------- Attending note: I have  seen and examined the patient. History, labs and imaging reviewed.  Briefly this is a 63 year old with diabetes, EtOH abuse admitted with DKA, EtOH withdrawal and MSSA bacteremia,UTI Scheduled for I&D of abscess today in [place] on Neo-Synephrine No  acute events overnight  Blood pressure 109/70, pulse (!) 54, temperature 97.6 F (36.4 C), temperature source Oral, resp. rate 16, height 5\' 9"  (1.753 m), weight 92.7 kg, SpO2 100 %. Gen:      No acute distress HEENT:  EOMI, sclera anicteric, ETT Neck:     No masses; no thyromegaly Lungs:    Clear to auscultation bilaterally; normal respiratory effort CV:         Regular rate and rhythm; no murmurs Abd:      + bowel sounds; soft, non-tender; no palpable masses, no distension Ext:    No edema; adequate peripheral perfusion Skin:      Warm and dry; no rash Neuro: Awake, responsive  Labs reviewed, significant for Glucose 167, creatinine 0.76 WBC 15.3, hemoglobin 10.5  Imaging Chest x-ray 7/21- bilateral lower lobe airspace opacities with effusion, ET tube in stable portion.  I have reviewed the images personally  Assessment/plan: Septic shock, MSSA bacteremia Wean down neo as tolerated For an I&D of posterior neck/upper back abscess today Will need TEE at some point.  Discussed with cardiology team.  They prefer to delay TEE until after I&D Continue Unasyn  A. fib with RVR, known normal sinus rhythm Stop amiodarone  Respiratory failure Keep intubated until after OR.  Will start pressure support weans later today  Diabetes Continue Levemir, SSI  The patient is critically ill with multiple organ systems failure and requires high complexity decision making for assessment and support, frequent evaluation and titration of therapies, application of advanced monitoring technologies and extensive interpretation of multiple databases.  Critical care time - 35 mins. This represents my time independent of the NPs time taking care of the pt.  Marshell Garfinkel MD [facility] 01/13/2019, 9:10 AM

## 2019-01-13 NOTE — Transfer of Care (Signed)
Immediate Anesthesia Transfer of Care Note  Patient: Frederick Moody  Procedure(s) Performed: IRRIGATION AND DEBRIDEMENT OF BACK ABSCESS (N/A Back)  Patient Location: ICU  Anesthesia Type:General  Level of Consciousness: sedated  Airway & Oxygen Therapy: Patient remains intubated per anesthesia plan and Patient placed on Ventilator (see vital sign flow sheet for setting)  Post-op Assessment: Report given to RN and Post -op Vital signs reviewed and stable  Post vital signs: Reviewed and stable  Last Vitals:  Vitals Value Taken Time  BP    Temp    Pulse 70 01/13/19 1332  Resp 16 01/13/19 1332  SpO2 100 % 01/13/19 1332  Vitals shown include unvalidated device data.  Last Pain:  Vitals:   01/13/19 1121  TempSrc: Oral  PainSc:          Complications: No apparent anesthesia complications

## 2019-01-13 NOTE — Op Note (Signed)
Preop diagnosis: Abscess upper back Postop diagnosis: Same Procedure performed: Incision, drainage, and debridement of abscess of the upper back (skin, subcutaneous tissue, muscle) Surgeon:Makinze Jani K Aveena Bari Anesthesia: General endotracheal Indications:Pt is a 63 year old diabetic with EtOH abuse admitted on 7/15 with DKA, EtOH withdrawal and MSSA bacteremia. Pt noted to have abscess to upper back/neck with drainage. CT scan of this area revealed a 11x5x6cm abscess extending to the superficial musculature with extension to the spinous processes without bony erosion.  This area had begun spontaneously draining but this does not seem to be adequately drained.  He presents now for incision and drainage.  Description of procedure: The patient is brought to the operating room already intubated on his ICU bed.  After adequate level general anesthesia was obtained he was moved to a prone position on the OR table.  His upper back was prepped with Betadine and draped sterile fashion.  A timeout was taken to ensure the proper patient and proper procedure.  The patient has a 1 cm opening that is draining some purulent fluid.  I sharply excise some the skin and subcutaneous tissue using cautery.  We dissected into a large abscess cavity containing some foul-smelling purulent fluid.  This was sent for culture.  Using cautery I have been excised a wider circle of skin and subcutaneous tissue.  We entered the abscess cavity.  The abscess extends down to the muscle overlying the spinous processes.  The infection tracks into the paraspinal muscles on either side.  We excised the skin and subcutaneous tissue and muscle that seem to be involved with the active infection.  Hemostasis was obtained with cautery.  I thoroughly irrigated the wound with pulse lavage and 3 L of saline.  Hemostasis was again inspected.  The wound was packed with saline moistened Kerlix.  A dry dressing is applied.  The patient is then transported back to  the intensive care unit in stable condition.  All sponge, instrument, and needle counts are correct.  The entire excision measures 15 x 10 cm x 5 cm deep.  Frederick Moody. Frederick Dover, MD, [facility] [phone]  01/13/2019 1:23 PM

## 2019-01-13 NOTE — Progress Notes (Signed)
Day of Surgery   Subjective/Chief Complaint: Remains intubated, still on some Neo Afebrile Central line and A-line in place Scheduled for TEE today Scheduled for OR today around noon.   Objective: Vital signs in last 24 hours: Temp:  [98.3 F (36.8 C)-103.8 F (39.9 C)] 98.3 F (36.8 C) (07/21 0346) Pulse Rate:  [51-103] 51 (07/21 0700) Resp:  [16-31] 16 (07/21 0700) BP: (84-140)/(46-73) 95/61 (07/21 0700) SpO2:  [96 %-100 %] 100 % (07/21 0700) Arterial Line BP: (79-144)/(43-74) 99/51 (07/21 0700) FiO2 (%):  [40 %] 40 % (07/21 0400) Weight:  [92.7 kg] 92.7 kg (07/21 0500) Last BM Date: 01/12/19  Intake/Output from previous day: 07/20 0701 - 07/21 0700 In: 5418.1 [I.V.:3419.1; NG/GT:320; IV Piggyback:1679] Out: 450 [Urine:150; Emesis/NG output:300] Intake/Output this shift: No intake/output data recorded.  Abscess (upper back/neck with purulent drainage, large area of induration, minimal erythema) present.   Lab Results:  Recent Labs    01/11/19 0348 01/12/19 0117 01/13/19 0434  WBC 11.2*  --  15.3*  HGB 11.6* 10.2* 10.5*  HCT 32.3* 30.0* 31.4*  PLT 166  --  249   BMET Recent Labs    01/12/19 1830 01/13/19 0434  NA 136 136  K 3.6 3.7  CL 109 109  CO2 20* 19*  GLUCOSE 112* 167*  BUN 7* 13  CREATININE 0.80 0.76  CALCIUM 6.8* 6.5*   PT/INR Recent Labs    01/13/19 0434  LABPROT 17.9*  INR 1.5*   ABG Recent Labs    01/11/19 2055 01/12/19 0117  PHART 7.421 7.406  HCO3 22.9 23.7    Studies/Results: Dg Elbow Complete Left (3+view)  Result Date: 01/11/2019 CLINICAL DATA:  Left elbow swelling. EXAM: LEFT ELBOW - COMPLETE 3+ VIEW COMPARISON:  None. FINDINGS: There is no evidence of fracture, dislocation, or joint effusion. There is no evidence of arthropathy or other focal bone abnormality. Soft tissues are unremarkable. IMPRESSION: Negative. Electronically Signed   By: Marijo Conception M.D.   On: 01/11/2019 13:52   Dg Abd 1 View  Result Date:  01/12/2019 CLINICAL DATA:  Bedside gastric tube placement. EXAM: Portable ABDOMEN-1 VIEW COMPARISON:  None. FINDINGS: The gastric tube courses into the stomach with its tip in the antropyloric region. Visualized upper abdominal bowel gas pattern unremarkable. IMPRESSION: Gastric tube courses into the stomach with its tip in the antropyloric region, appropriately positioned. Electronically Signed   By: Evangeline Dakin M.D.   On: 01/12/2019 16:36   Ct Head Wo Contrast  Result Date: 01/12/2019 CLINICAL DATA:  Encephalopathy EXAM: CT HEAD WITHOUT CONTRAST TECHNIQUE: Contiguous axial images were obtained from the base of the skull through the vertex without intravenous contrast. COMPARISON:  02/18/2018 head CT FINDINGS: Brain: No evidence of acute infarction, hemorrhage, hydrocephalus, extra-axial collection or mass lesion/mass effect. 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 Small remote high left frontal cortex infarct. Vascular: Atherosclerotic calcification Skull: Normal. Negative for fracture or focal lesion. Sinuses/Orbits: Right maxillary sinusitis with mucosal thickening and fluid level, also seen in 2019. Partial left mastoid opacification in the setting of intubation and nasopharyngeal fluid IMPRESSION: 1. No acute finding or change from 2019. 2. Small remote infarcts. 3. Chronic right maxillary sinusitis. Electronically Signed   By: Monte Fantasia M.D.   On: 01/12/2019 04:43   Ct Chest Wo Contrast  Result Date: 01/12/2019 CLINICAL DATA:  Acute respiratory illness.  MSSA bacteremia. EXAM: CT CHEST WITHOUT CONTRAST TECHNIQUE: Multidetector CT imaging of the chest was performed following the standard protocol without IV  contrast. COMPARISON:  None. FINDINGS: Cardiovascular: Normal heart size. Coronary atherosclerotic calcification. No pericardial effusion or vascular finding without contrast. Mediastinum/Nodes: Nasogastric tube tip reaches the stomach at least. Mild prominence of mediastinal  lymph nodes considered reactive. Endotracheal tube in good position. Lungs/Pleura: Moderate bilateral layering pleural effusion with atelectasis of the dependent lungs, multi segment. No pulmonary edema or pneumothorax. Upper Abdomen: Negative Musculoskeletal: Subcutaneous collection in the upper midline back. Dedicated thoracic spine reformats described separately. IMPRESSION: 1. Moderate layering pleural effusions with multi segment atelectasis. 2. Dorsal cervicothoracic soft tissue inflammation as described on dedicated thoracic spine study. Electronically Signed   By: Marnee SpringJonathon  Watts M.D.   On: 01/12/2019 04:59   Ct Thoracic Spine W Wo Contrast  Result Date: 01/12/2019 CLINICAL DATA:  Pressure ulcer with draining purulence from wound EXAM: CT THORACIC SPINE WITHOUT AND WITH CONTRAST TECHNIQUE: Multidetector CT images of the thoracic was performed following the standard protocol before and during bolus administration of intravenous contrast. CONTRAST:  75mL OMNIPAQUE IOHEXOL 300 MG/ML  SOLN COMPARISON:  None. FINDINGS: Alignment: Unremarkable Vertebrae: Soft tissue inflammation extends to the spinous processes of the lower cervical and upper thoracic spine without bony erosion. No underlying discitis/osteomyelitis. Remote T4 superior endplate fracture. Paraspinal and other soft tissues: Lobulated subcutaneous fluid collection measuring 11 x 5 x 6 cm, extending from the skin surface to the para median trapezius and rhomboids. Small volume gas is seen within the anti dependent collection which is multi septated/lobulated. Left thyroid nodule considered incidental. Atherosclerosis. Moderate pleural effusions with multi segment pulmonary collapse. Disc levels: Generalized spondylosis. Notable C5-6 right eccentric endplate and uncovertebral ridging with right foraminal impingement. IMPRESSION: 11 x 5 x 6 cm abscess in the dorsal cervicothoracic soft tissues extending from the skin surface into the superficial  musculature. There is extension to spinous processes without bony erosion. Electronically Signed   By: Marnee SpringJonathon  Watts M.D.   On: 01/12/2019 05:04   Dg Chest Port 1 View  Result Date: 01/12/2019 CLINICAL DATA:  Status post central line placement EXAM: PORTABLE CHEST 1 VIEW COMPARISON:  01/11/2019 FINDINGS: Endotracheal tube and gastric catheter are noted in satisfactory position. A new right jugular central line is noted at the cavoatrial junction. No pneumothorax is seen. Bilateral pleural effusions are noted over the bases. IMPRESSION: No evidence of pneumothorax following central line placement. Bilateral pleural effusions over the bases. Electronically Signed   By: Alcide CleverMark  Lukens M.D.   On: 01/12/2019 15:31   Dg Chest Port 1 View  Result Date: 01/11/2019 CLINICAL DATA:  Intubation EXAM: PORTABLE CHEST 1 VIEW COMPARISON:  January 11, 2019 FINDINGS: There has been interval placement of an endotracheal tube tip terminates approximately 3 cm above the carina. The enteric tube extends below the left hemidiaphragm. Again noted are diffuse multifocal bilateral airspace opacities. There are small bilateral pleural effusions. There is no pneumothorax. The heart size is stable. Aortic calcifications are noted. IMPRESSION: Lines and tubes as above. Persistent bilateral airspace opacities similar to prior study. Bilateral pleural effusions with short interval increase in size. Electronically Signed   By: Katherine Mantlehristopher  Green M.D.   On: 01/11/2019 23:58   Dg Chest Port 1 View  Result Date: 01/11/2019 CLINICAL DATA:  Respiratory distress EXAM: PORTABLE CHEST 1 VIEW COMPARISON:  January 10, 2019 FINDINGS: There are worsening patchy bilateral airspace opacities. There may be small bilateral pleural effusions. The heart size is stable. Aortic calcifications are noted. There is no acute osseous abnormality. IMPRESSION: Worsening multifocal airspace opacities. Electronically Signed  By: Katherine Mantlehristopher  Green M.D.   On: 01/11/2019  21:37   [place] Ekg Site Rite  Result Date: 01/11/2019 If Site Rite image not attached, placement could not be confirmed due to current cardiac rhythm.   Anti-infectives: Anti-infectives (From admission, onward)   Start     Dose/Rate Route Frequency Ordered Stop   01/12/19 1400  Ampicillin-Sulbactam (UNASYN) 3 g in sodium chloride 0.9 % 100 mL IVPB     3 g 200 mL/hr over 30 Minutes Intravenous Every 6 hours 01/12/19 1101     01/12/19 1000  vancomycin (VANCOCIN) 1,250 mg in sodium chloride 0.9 % 250 mL IVPB  Status:  Discontinued     1,250 mg 166.7 mL/hr over 90 Minutes Intravenous Every 12 hours 01/12/19 0115 01/12/19 1101   01/12/19 0115  vancomycin (VANCOCIN) 1,500 mg in sodium chloride 0.9 % 500 mL IVPB     1,500 mg 250 mL/hr over 120 Minutes Intravenous  Once 01/12/19 0100 01/12/19 0514   01/10/19 1230  meropenem (MERREM) 1 g in sodium chloride 0.9 % 100 mL IVPB  Status:  Discontinued     1 g 200 mL/hr over 30 Minutes Intravenous Every 8 hours 01/10/19 1146 01/12/19 1101   01/08/19 2200  ceFAZolin (ANCEF) IVPB 2g/100 mL premix  Status:  Discontinued     2 g 200 mL/hr over 30 Minutes Intravenous Every 8 hours 01/08/19 1030 01/10/19 1146   01/08/19 1800  vancomycin (VANCOCIN) 1,500 mg in sodium chloride 0.9 % 500 mL IVPB  Status:  Discontinued     1,500 mg 250 mL/hr over 120 Minutes Intravenous Every 24 hours 01/07/19 1830 01/08/19 1030   01/08/19 0630  cefTRIAXone (ROCEPHIN) 2 g in sodium chloride 0.9 % 100 mL IVPB  Status:  Discontinued     2 g 200 mL/hr over 30 Minutes Intravenous Daily 01/08/19 0536 01/08/19 1030   01/08/19 0600  ceFEPIme (MAXIPIME) 2 g in sodium chloride 0.9 % 100 mL IVPB  Status:  Discontinued     2 g 200 mL/hr over 30 Minutes Intravenous Every 12 hours 01/07/19 1830 01/07/19 2132   01/07/19 1745  vancomycin (VANCOCIN) 2,000 mg in sodium chloride 0.9 % 500 mL IVPB     2,000 mg 250 mL/hr over 120 Minutes Intravenous  Once 01/07/19 1738 01/07/19 2013   01/07/19  1745  ceFEPIme (MAXIPIME) 2 g in sodium chloride 0.9 % 100 mL IVPB     2 g 200 mL/hr over 30 Minutes Intravenous  Once 01/07/19 1738 01/07/19 1904      Assessment/Plan: Principal Problem:   MSSA bacteremia Active Problems:   DKA (diabetic ketoacidoses) (HCC)   Alcohol withdrawal (HCC)   Aspiration pneumonia (HCC)   Sepsis (HCC)   Acute encephalopathy   Acute respiratory failure (HCC)  Sepsis likely 2/2  Posterior neck/upper back abscess - CT showed 11x5x6cm abscess extending to the superficial musculature with extension to the spinous processes without bony erosion.   Plan: OR today for I&D Please schedule TEE prior to 11:00 AM to prevent further delays to the OR Obtained consent [facility]   LOS: 6 days    Wynona LunaMatthew K Merrel Crabbe 01/13/2019

## 2019-01-13 NOTE — Progress Notes (Signed)
Dr. Vaughan Browner made aware that pt has not spontaneously voided in over 30 hours. Pt was bladder scanned at 1045 with greater than 400 as the result. Per protocol, an I/O cath was performed. After arrival back from surgery, another bladder scan was performed with 94cc as the result. Last bladder scan was 104cc. An order for a foley catheter was given and an order for a 1L LR bolus. Will put in orders and continue to monitor.

## 2019-01-13 NOTE — Plan of Care (Signed)
  Problem: Safety: Goal: Ability to remain free from injury will improve Outcome: Progressing   Problem: Respiratory: Goal: Ability to maintain a clear airway and adequate ventilation will improve Outcome: Progressing   Problem: Nutrition: Goal: Adequate nutrition will be maintained Outcome: Not Progressing Note: Tube feedings started evening of 7/20, but due to surgery today, Pt has been NPO after midnight. Will restart TF after surgery.

## 2019-01-13 NOTE — Anesthesia Postprocedure Evaluation (Signed)
Anesthesia Post Note  Patient: Frederick Moody  Procedure(s) Performed: IRRIGATION AND DEBRIDEMENT OF BACK ABSCESS (N/A Back)     Patient location during evaluation: SICU Anesthesia Type: General Level of consciousness: sedated and patient remains intubated per anesthesia plan Pain management: pain level controlled Vital Signs Assessment: post-procedure vital signs reviewed and stable Respiratory status: patient on ventilator - see flowsheet for VS and patient remains intubated per anesthesia plan Cardiovascular status: stable Postop Assessment: no apparent nausea or vomiting Anesthetic complications: no Comments: Pt to remain intubated, sedated  Unchanged from pre-op    Last Vitals:  Vitals:   01/13/19 1537 01/13/19 1546  BP:  121/61  Pulse:  76  Resp:  (!) 22  Temp: 36.5 C   SpO2:  100%    Last Pain:  Vitals:   01/13/19 1537  TempSrc: Oral  PainSc:                  Frederick Moody,Frederick Moody

## 2019-01-14 ENCOUNTER — Inpatient Hospital Stay (HOSPITAL_COMMUNITY): Payer: Medicaid Other

## 2019-01-14 ENCOUNTER — Encounter (HOSPITAL_COMMUNITY): Payer: Self-pay | Admitting: Surgery

## 2019-01-14 LAB — COMPREHENSIVE METABOLIC PANEL
ALT: 34 U/L (ref 0–44)
AST: 42 U/L — ABNORMAL HIGH (ref 15–41)
Albumin: 1.1 g/dL — ABNORMAL LOW (ref 3.5–5.0)
Alkaline Phosphatase: 100 U/L (ref 38–126)
Anion gap: 7 (ref 5–15)
BUN: 22 mg/dL (ref 8–23)
CO2: 19 mmol/L — ABNORMAL LOW (ref 22–32)
Calcium: 6.8 mg/dL — ABNORMAL LOW (ref 8.9–10.3)
Chloride: 112 mmol/L — ABNORMAL HIGH (ref 98–111)
Creatinine, Ser: 0.81 mg/dL (ref 0.61–1.24)
GFR calc Af Amer: >60 mL/min (ref >60)
GFR calc non Af Amer: >60 mL/min (ref >60)
Glucose, Bld: 253 mg/dL — ABNORMAL HIGH (ref 70–99)
Potassium: 4.2 mmol/L (ref 3.5–5.1)
Sodium: 138 mmol/L (ref 135–145)
Total Bilirubin: 1.4 mg/dL — ABNORMAL HIGH (ref 0.3–1.2)
Total Protein: 5.6 g/dL — ABNORMAL LOW (ref 6.5–8.1)

## 2019-01-14 LAB — CBC WITH DIFFERENTIAL/PLATELET
Abs Immature Granulocytes: 0.12 10*3/uL — ABNORMAL HIGH (ref 0.00–0.07)
Basophils Absolute: 0 10*3/uL (ref 0.0–0.1)
Basophils Relative: 0 %
Eosinophils Absolute: 0 10*3/uL (ref 0.0–0.5)
Eosinophils Relative: 0 %
HCT: 32 % — ABNORMAL LOW (ref 39.0–52.0)
Hemoglobin: 10.6 g/dL — ABNORMAL LOW (ref 13.0–17.0)
Immature Granulocytes: 1 %
Lymphocytes Relative: 7 %
Lymphs Abs: 0.8 10*3/uL (ref 0.7–4.0)
MCH: 32.2 pg (ref 26.0–34.0)
MCHC: 33.1 g/dL (ref 30.0–36.0)
MCV: 97.3 fL (ref 80.0–100.0)
Monocytes Absolute: 0.2 10*3/uL (ref 0.1–1.0)
Monocytes Relative: 2 %
Neutro Abs: 10.8 10*3/uL — ABNORMAL HIGH (ref 1.7–7.7)
Neutrophils Relative %: 90 %
Platelets: 274 10*3/uL (ref 150–400)
RBC: 3.29 MIL/uL — ABNORMAL LOW (ref 4.22–5.81)
RDW: 13.1 % (ref 11.5–15.5)
WBC: 11.9 10*3/uL — ABNORMAL HIGH (ref 4.0–10.5)
nRBC: 0 % (ref 0.0–0.2)

## 2019-01-14 LAB — GLUCOSE, CAPILLARY
Glucose-Capillary: 204 mg/dL — ABNORMAL HIGH (ref 70–99)
Glucose-Capillary: 212 mg/dL — ABNORMAL HIGH (ref 70–99)
Glucose-Capillary: 243 mg/dL — ABNORMAL HIGH (ref 70–99)
Glucose-Capillary: 244 mg/dL — ABNORMAL HIGH (ref 70–99)
Glucose-Capillary: 246 mg/dL — ABNORMAL HIGH (ref 70–99)
Glucose-Capillary: 262 mg/dL — ABNORMAL HIGH (ref 70–99)

## 2019-01-14 LAB — CULTURE, BLOOD (ROUTINE X 2)
Culture: NO GROWTH
Culture: NO GROWTH
Special Requests: ADEQUATE
Special Requests: ADEQUATE

## 2019-01-14 LAB — PHOSPHORUS: Phosphorus: 3.3 mg/dL (ref 2.5–4.6)

## 2019-01-14 LAB — MAGNESIUM: Magnesium: 2 mg/dL (ref 1.7–2.4)

## 2019-01-14 MED ORDER — VITAL AF 1.2 CAL PO LIQD
1000.0000 mL | ORAL | Status: DC
  Administered 2019-01-14 – 2019-01-23 (×9): 1000 mL
  Filled 2019-01-14 (×6): qty 1000

## 2019-01-14 MED ORDER — FUROSEMIDE 10 MG/ML IJ SOLN
40.0000 mg | Freq: Once | INTRAMUSCULAR | Status: AC
  Administered 2019-01-14: 40 mg via INTRAVENOUS
  Filled 2019-01-14: qty 4

## 2019-01-14 NOTE — Progress Notes (Signed)
 Nutrition Follow-up  DOCUMENTATION CODES:   Non-severe (moderate) malnutrition in context of chronic illness  INTERVENTION:   Tube Feeding: Goal: Vital AF 1.2 at 75 ml/hr Increase TF to 35 ml/hr; titrate by 10 mL q 6 hours until goal of 75 ml/hr Provide 135 g of protein, 2160 kcals and 1458 mL of free water Goal meets 100% estimated protein needs, 99% calorie needs:    NUTRITION DIAGNOSIS:   Moderate Malnutrition related to chronic illness(poorly controlled DM, EtOH abuse, abscess) as evidenced by percent weight loss, mild muscle depletion.  Being addressed via TF   GOAL:   Patient will meet greater than or equal to 90% of their needs  Progressed  MONITOR:   TF tolerance, Vent status, Labs, Weight trends  REASON FOR ASSESSMENT:   Ventilator    ASSESSMENT:   63 yo male with hx of uncontrolled DM and EtOH abuse presents with weakness and falls. Pt admitted with DKA, sepsis with MSSA bacteremia and abscess on back, AMS with EtOH withdrawal and possible benzo OD and aspiration requiring intubation.   7/15 Admit 7/19 Intubated 7/21 I&D posterior neck/upper back abscess; 15 cm x 10 cm x 5 cm   Patient is currently intubated on ventilator support, phenylephrine MV: 9.6 L/min Temp (24hrs), Avg:97.9 F (36.6 C), Min:97.2 F (36.2 C), Max:98.6 F (37 C)  Vital AF 1.2 at 20 ml/hr  Current wt 96.7 kg; admission weight 85.3 kg. Significantly net+ per I/O flow sheet.   Labs: CBGs 167-246 Meds: ss novolog, levemir, folic acid, LR at 75 ml/hr, MVI, thiamine   Diet Order:   Diet Order            Diet NPO time specified  Diet effective midnight              EDUCATION NEEDS:   Not appropriate for education at this time  Skin:  Skin Assessment: Skin Integrity Issues: Skin Integrity Issues:: Other (Comment) Other: abscess to posteror neck/upper back 11x5x6 cm; MASD to buttock  Last BM:  7/20  Height:   Ht Readings from Last 1 Encounters:  01/12/19 5\' 9"   (1.753 m)    Weight:   Wt Readings from Last 1 Encounters:  01/14/19 96.7 kg    BMI:  Body mass index is 31.48 kg/m.  Estimated Nutritional Needs:   Kcal:  2183 kcals  Protein:  125-155 g  Fluid:  >/= 2 L    Jacobi Nile MS, RDN, LDN, CNSC [phone] Pager  [phone] Weekend/On-Call Pager

## 2019-01-14 NOTE — Progress Notes (Signed)
Inpatient Diabetes Program Recommendations  AACE/ADA: New Consensus Statement on Inpatient Glycemic Control (2015)  Target Ranges:  Prepandial:   less than 140 mg/dL      Peak postprandial:   less than 180 mg/dL (1-2 hours)      Critically ill patients:  140 - 180 mg/dL   Lab Results  Component Value Date   GLUCAP 204 (H) 01/14/2019   HGBA1C 11.7 (H) 01/07/2019    Review of Glycemic Control Results for PINCHOS, TOPEL (MRN [id number]) as of 01/14/2019 14:24  Ref. Range 01/13/2019 07:37 01/13/2019 11:24 01/13/2019 15:33 01/13/2019 19:59 01/13/2019 23:40 01/14/2019 03:36 01/14/2019 07:55 01/14/2019 11:41  Glucose-Capillary Latest Ref Range: 70 - 99 mg/dL 158 (H) 156 (H) Decadron 10 mg given 167 (H) 209 (H) 246 (H) 243 (H) 244 (H) 204 (H)    Inpatient Diabetes Program Recommendations:    Glucose trends elevated above inpatient goal. Levemir not given last night per RN due to NPO status. Patient received decadron 10 mg yest am. Noted Levemir also not given the night before as ordered.   Thanks, Tama Headings RN, MSN, BC-ADM Inpatient Diabetes Coordinator Team Pager [phone] (8a-5p)

## 2019-01-14 NOTE — Progress Notes (Signed)
[facility]   Reason for visit: Follow up on MSSA bacteremia  Interval History: Patient afebrile (last febrile 7/20), with down-trending WBC. Patient on Unasyn for MSSA and aspiration pneumonia coverage. Patient had I&D with cultures of upper back abscess yesterday with total excision measuring 15 x 10 x 5 cm deep.  Subjective: Unable to be obtained, patient intubated.  Physical Exam: Constitutional: Patient intubated, somnolent Vitals:   01/14/19 1000 01/14/19 1048  BP: 112/68   Pulse: 76   Resp: (!) 8   Temp:    SpO2: 100% 100%  Respiratory: CTAB Cardiovascular: RRR, no m/r/g GI: Soft, non-distended  Review of Systems: Unable to be obtained, patient intubated  Lab Results  Component Value Date   WBC 11.9 (H) 01/14/2019   HGB 10.6 (L) 01/14/2019   HCT 32.0 (L) 01/14/2019   MCV 97.3 01/14/2019   PLT 274 01/14/2019    Lab Results  Component Value Date   CREATININE 0.81 01/14/2019   BUN 22 01/14/2019   NA 138 01/14/2019   K 4.2 01/14/2019   CL 112 (H) 01/14/2019   CO2 19 (L) 01/14/2019    Lab Results  Component Value Date   ALT 34 01/14/2019   AST 42 (H) 01/14/2019   ALKPHOS 100 01/14/2019     Microbiology: Recent Results (from the past 240 hour(s))  Culture, blood (Routine x 2)     Status: Abnormal   Collection Time: 01/07/19 11:05 AM   Specimen: BLOOD  Result Value Ref Range Status   Specimen Description BLOOD BLOOD RIGHT FOREARM  Final   Special Requests   Final    BOTTLES DRAWN AEROBIC AND ANAEROBIC Blood Culture adequate volume   Culture  Setup Time   Final    GRAM POSITIVE COCCI IN CLUSTERS IN BOTH AEROBIC AND ANAEROBIC BOTTLES CRITICAL RESULT CALLED TO, READ BACK BY AND VERIFIED WITH: Antoine PrimasE. Martin PharmD 10:15 01/08/19 ([email or web address]) Performed at [facility], [address] [place], [place] [address]    Culture STAPHYLOCOCCUS AUREUS (A)  Final   Report Status 01/10/2019 FINAL  Final   Organism ID, Bacteria  STAPHYLOCOCCUS AUREUS  Final      Susceptibility   Staphylococcus aureus - MIC*    CIPROFLOXACIN >=8 RESISTANT Resistant     ERYTHROMYCIN >=8 RESISTANT Resistant     GENTAMICIN <=0.5 SENSITIVE Sensitive     OXACILLIN 0.5 SENSITIVE Sensitive     TETRACYCLINE <=1 SENSITIVE Sensitive     VANCOMYCIN 1 SENSITIVE Sensitive     TRIMETH/SULFA <=10 SENSITIVE Sensitive     CLINDAMYCIN >=8 RESISTANT Resistant     RIFAMPIN <=0.5 SENSITIVE Sensitive     Inducible Clindamycin NEGATIVE Sensitive     * STAPHYLOCOCCUS AUREUS  Blood Culture ID Panel (Reflexed)     Status: Abnormal   Collection Time: 01/07/19 11:05 AM  Result Value Ref Range Status   Enterococcus species NOT DETECTED NOT DETECTED Final   Listeria monocytogenes NOT DETECTED NOT DETECTED Final   Staphylococcus species DETECTED (A) NOT DETECTED Final    Comment: CRITICAL RESULT CALLED TO, READ BACK BY AND VERIFIED WITH: Antoine PrimasE. Martin PharmD 10:15 01/08/19 ([email or web address])    Staphylococcus aureus (BCID) DETECTED (A) NOT DETECTED Final    Comment: Methicillin (oxacillin) susceptible Staphylococcus aureus (MSSA). Preferred therapy is anti staphylococcal beta lactam antibiotic (Cefazolin or Nafcillin), unless clinically contraindicated. CRITICAL RESULT CALLED TO, READ BACK BY AND VERIFIED WITH: Antoine PrimasE. Martin PharmD 10:15 01/08/19 ([email or web address])    Methicillin resistance NOT DETECTED NOT  DETECTED Final   Streptococcus species NOT DETECTED NOT DETECTED Final   Streptococcus agalactiae NOT DETECTED NOT DETECTED Final   Streptococcus pneumoniae NOT DETECTED NOT DETECTED Final   Streptococcus pyogenes NOT DETECTED NOT DETECTED Final   Acinetobacter baumannii NOT DETECTED NOT DETECTED Final   Enterobacteriaceae species NOT DETECTED NOT DETECTED Final   Enterobacter cloacae complex NOT DETECTED NOT DETECTED Final   Escherichia coli NOT DETECTED NOT DETECTED Final   Klebsiella oxytoca NOT DETECTED NOT DETECTED Final   Klebsiella pneumoniae NOT DETECTED NOT DETECTED  Final   Proteus species NOT DETECTED NOT DETECTED Final   Serratia marcescens NOT DETECTED NOT DETECTED Final   Haemophilus influenzae NOT DETECTED NOT DETECTED Final   Neisseria meningitidis NOT DETECTED NOT DETECTED Final   Pseudomonas aeruginosa NOT DETECTED NOT DETECTED Final   Candida albicans NOT DETECTED NOT DETECTED Final   Candida glabrata NOT DETECTED NOT DETECTED Final   Candida krusei NOT DETECTED NOT DETECTED Final   Candida parapsilosis NOT DETECTED NOT DETECTED Final   Candida tropicalis NOT DETECTED NOT DETECTED Final    Comment: Performed at [facility], [place] [address] [place], [place] [address]  Culture, blood (Routine x 2)     Status: Abnormal   Collection Time: 01/07/19  5:25 PM   Specimen: BLOOD LEFT ARM  Result Value Ref Range Status   Specimen Description BLOOD LEFT ARM  Final   Special Requests   Final    BOTTLES DRAWN AEROBIC AND ANAEROBIC Blood Culture results may not be optimal due to an inadequate volume of blood received in culture bottles   Culture  Setup Time   Final    GRAM POSITIVE COCCI AEROBIC BOTTLE ONLY CRITICAL RESULT CALLED TO, READ BACK BY AND VERIFIED WITH: PHARMD E MARTIN [id number] AT 1107 AM BY CM    Culture (A)  Final    STAPHYLOCOCCUS AUREUS SUSCEPTIBILITIES PERFORMED ON PREVIOUS CULTURE WITHIN THE LAST 5 DAYS. Performed at [facility], [place] [address] [place], [place] [address]    Report Status 01/10/2019 FINAL  Final  SARS Coronavirus 2 (CEPHEID - Performed in [place] hospital lab), Hosp Order     Status: None   Collection Time: 01/07/19  5:35 PM   Specimen: Nasopharyngeal Swab  Result Value Ref Range Status   SARS Coronavirus 2 NEGATIVE NEGATIVE Final    Comment: (NOTE) If result is NEGATIVE SARS-CoV-2 target nucleic acids are NOT DETECTED. The SARS-CoV-2 RNA is generally detectable in upper and lower  respiratory specimens during the acute phase of infection. The lowest  concentration of SARS-CoV-2 viral copies  this assay can detect is 250  copies / mL. A negative result does not preclude SARS-CoV-2 infection  and should not be used as the sole basis for treatment or other  patient management decisions.  A negative result may occur with  improper specimen collection / handling, submission of specimen other  than nasopharyngeal swab, presence of viral mutation(s) within the  areas targeted by this assay, and inadequate number of viral copies  (<250 copies / mL). A negative result must be combined with clinical  observations, patient history, and epidemiological information. If result is POSITIVE SARS-CoV-2 target nucleic acids are DETECTED. The SARS-CoV-2 RNA is generally detectable in upper and lower  respiratory specimens dur ing the acute phase of infection.  Positive  results are indicative of active infection with SARS-CoV-2.  Clinical  correlation with patient history and other diagnostic information is  necessary to determine patient  infection status.  Positive results do  not rule out bacterial infection or co-infection with other viruses. If result is PRESUMPTIVE POSTIVE SARS-CoV-2 nucleic acids MAY BE PRESENT.   A presumptive positive result was obtained on the submitted specimen  and confirmed on repeat testing.  While 2019 novel coronavirus  (SARS-CoV-2) nucleic acids may be present in the submitted sample  additional confirmatory testing may be necessary for epidemiological  and / or clinical management purposes  to differentiate between  SARS-CoV-2 and other Sarbecovirus currently known to infect humans.  If clinically indicated additional testing with an alternate test  methodology [phone] is advised. The SARS-CoV-2 RNA is generally  detectable in upper and lower respiratory sp ecimens during the acute  phase of infection. The expected result is Negative. Fact Sheet for Patients:  BoilerBrush.com.cyhttps://www.fda.gov/media/136312/download Fact Sheet for Healthcare Providers:  https://pope.com/https://www.fda.gov/media/136313/download This test is not yet approved or cleared by the [place] FDA and has been authorized for detection and/or diagnosis of SARS-CoV-2 by FDA under an Emergency Use Authorization (EUA).  This EUA will remain in effect (meaning this test can be used) for the duration of the COVID-19 declaration under Section 564(b)(1) of the Act, 21 U.S.C. section 360bbb-3(b)(1), unless the authorization is terminated or revoked sooner. Performed at [facility], [address] [place], [place] [address]   Urine culture     Status: Abnormal   Collection Time: 01/07/19  6:22 PM   Specimen: In/Out Cath Urine  Result Value Ref Range Status   Specimen Description IN/OUT CATH URINE  Final   Special Requests NONE  Final   Culture (A)  Final    40,000 COLONIES/mL STAPHYLOCOCCUS AUREUS 20,000 COLONIES/mL STAPHYLOCOCCUS SPECIES (COAGULASE NEGATIVE) 1,000 COLONIES/mL VIRIDANS STREPTOCOCCUS Standardized susceptibility testing for this organism is not available. Performed at [facility], [address] [place], [place] [address]    Report Status 01/10/2019 FINAL  Final   Organism ID, Bacteria STAPHYLOCOCCUS AUREUS (A)  Final   Organism ID, Bacteria STAPHYLOCOCCUS SPECIES (COAGULASE NEGATIVE) (A)  Final      Susceptibility   Staphylococcus aureus - MIC*    CIPROFLOXACIN >=8 RESISTANT Resistant     GENTAMICIN <=0.5 SENSITIVE Sensitive     NITROFURANTOIN <=16 SENSITIVE Sensitive     OXACILLIN 0.5 SENSITIVE Sensitive     TETRACYCLINE <=1 SENSITIVE Sensitive     VANCOMYCIN 1 SENSITIVE Sensitive     TRIMETH/SULFA <=10 SENSITIVE Sensitive     CLINDAMYCIN <=0.25 SENSITIVE Sensitive     RIFAMPIN <=0.5 SENSITIVE Sensitive     Inducible Clindamycin NEGATIVE Sensitive     * 40,000 COLONIES/mL STAPHYLOCOCCUS AUREUS   Staphylococcus species (coagulase negative) - MIC*    CIPROFLOXACIN <=0.5 SENSITIVE Sensitive     GENTAMICIN <=0.5 SENSITIVE Sensitive      NITROFURANTOIN 32 SENSITIVE Sensitive     OXACILLIN <=0.25 SENSITIVE Sensitive     TETRACYCLINE <=1 SENSITIVE Sensitive     VANCOMYCIN <=0.5 SENSITIVE Sensitive     TRIMETH/SULFA <=10 SENSITIVE Sensitive     CLINDAMYCIN <=0.25 SENSITIVE Sensitive     RIFAMPIN <=0.5 SENSITIVE Sensitive     Inducible Clindamycin NEGATIVE Sensitive     * 20,000 COLONIES/mL STAPHYLOCOCCUS SPECIES (COAGULASE NEGATIVE)  Culture, blood (routine x 2)     Status: None (Preliminary result)   Collection Time: 01/09/19  8:25 AM   Specimen: BLOOD LEFT HAND  Result Value Ref Range Status   Specimen Description BLOOD LEFT HAND  Final   Special Requests   Final  BOTTLES DRAWN AEROBIC ONLY Blood Culture adequate volume   Culture   Final    NO GROWTH 4 DAYS Performed at [facility], [address], [place], [place] [address]    Report Status PENDING  Incomplete  Culture, blood (routine x 2)     Status: None (Preliminary result)   Collection Time: 01/09/19  8:26 AM   Specimen: BLOOD LEFT HAND  Result Value Ref Range Status   Specimen Description BLOOD LEFT HAND  Final   Special Requests   Final    BOTTLES DRAWN AEROBIC ONLY Blood Culture adequate volume   Culture   Final    NO GROWTH 4 DAYS Performed at [facility], [address] [place], [place] [address]    Report Status PENDING  Incomplete  Culture, Urine     Status: None   Collection Time: 01/10/19 11:24 AM   Specimen: Urine, Random  Result Value Ref Range Status   Specimen Description URINE, RANDOM  Final   Special Requests NONE  Final   Culture   Final    NO GROWTH Performed at [facility], [address] [place], [place] [address]    Report Status 01/11/2019 FINAL  Final  C difficile quick scan w PCR reflex     Status: None   Collection Time: 01/11/19  3:44 PM   Specimen: STOOL  Result Value Ref Range Status   C Diff antigen NEGATIVE NEGATIVE Final   C Diff toxin NEGATIVE NEGATIVE Final   C Diff interpretation No C.  difficile detected.  Final    Comment: Performed at [facility], [address], [place], [place] [address]  MRSA PCR Screening     Status: None   Collection Time: 01/11/19 11:42 PM   Specimen: Nasal Mucosa; Nasopharyngeal  Result Value Ref Range Status   MRSA by PCR NEGATIVE NEGATIVE Final    Comment:        The GeneXpert MRSA Assay (FDA approved for NASAL specimens only), is one component of a comprehensive MRSA colonization surveillance program. It is not intended to diagnose MRSA infection nor to guide or monitor treatment for MRSA infections. Performed at [facility], [address] [place], [place] [address]   Culture, blood (routine x 2)     Status: None (Preliminary result)   Collection Time: 01/12/19  1:43 AM   Specimen: BLOOD LEFT HAND  Result Value Ref Range Status   Specimen Description BLOOD LEFT HAND  Final   Special Requests   Final    BOTTLES DRAWN AEROBIC ONLY Blood Culture results may not be optimal due to an inadequate volume of blood received in culture bottles   Culture   Final    NO GROWTH 1 DAY Performed at [facility], [address], [place], [place] [address]    Report Status PENDING  Incomplete  Culture, blood (routine x 2)     Status: None (Preliminary result)   Collection Time: 01/12/19  1:50 AM   Specimen: BLOOD RIGHT HAND  Result Value Ref Range Status   Specimen Description BLOOD RIGHT HAND  Final   Special Requests   Final    BOTTLES DRAWN AEROBIC ONLY Blood Culture adequate volume   Culture   Final    NO GROWTH 1 DAY Performed at [facility], [address] [place], [place] [address]    Report Status PENDING  Incomplete  Culture, respiratory (non-expectorated)  Status: None (Preliminary result)   Collection Time: 01/12/19 12:47 PM   Specimen: Tracheal Aspirate; Respiratory  Result Value Ref Range Status   Specimen Description TRACHEAL ASPIRATE  Final   Special Requests NONE  Final   Gram Stain   Final     RARE WBC PRESENT, PREDOMINANTLY PMN NO ORGANISMS SEEN Performed at [facility], [address] [place], [place] [address]    Culture FEW GRAM NEGATIVE RODS  Final   Report Status PENDING  Incomplete  Surgical PCR screen     Status: Abnormal   Collection Time: 01/13/19  9:00 AM   Specimen: Nasal Mucosa; Nasal Swab  Result Value Ref Range Status   MRSA, PCR NEGATIVE NEGATIVE Final   Staphylococcus aureus POSITIVE (A) NEGATIVE Final    Comment: (NOTE) The Xpert SA Assay (FDA approved for NASAL specimens in patients 64 years of age and older), is one component of a comprehensive surveillance program. It is not intended to diagnose infection nor to guide or monitor treatment. Performed at [facility], [address] [place], [place] [address]   Aerobic/Anaerobic Culture (surgical/deep wound)     Status: None (Preliminary result)   Collection Time: 01/13/19 12:37 PM   Specimen: Abscess  Result Value Ref Range Status   Specimen Description ABSCESS UPPER BACK  Final   Special Requests NONE  Final   Gram Stain   Final    NO WBC SEEN FEW GRAM POSITIVE COCCI IN PAIRS Performed at [facility], [address] [place], [place] [address]    Culture PENDING  Incomplete   Report Status PENDING  Incomplete    Impression:  63 yo male with PMH of uncontrolled DM who presented with weakness, fever, tachycardia, elevated lactic acid consistent with sepsis and found to have MSSA bacteremia. Patient is s/p I&D of upper back abscess. Patient still requiring pressors.   Plan: 1. Continue Unasyn for one more day - will narrow back to ancef tomorrow 2. Wait on PICC until blood cultures from 7/20 are negative at 72 hours 3. Will need TEE to evaluate for vegetations.

## 2019-01-14 NOTE — Progress Notes (Addendum)
NAME:  Frederick SizerRobert Moody, MRN:  [id number], DOB:  08/14/1955, LOS: 7 ADMISSION DATE:  01/07/2019, CONSULTATION DATE:  01/11/19 CHIEF COMPLAINT:  Encephaolpathy   Brief History   This is a 63 yo with history of uncontrolled DM and etoh abuse who presents for weakness and falls. Found to have etoh withdrawal and DKA (resolved) and MSSA bacteremia likely source is abscess on upper back. Patient was admitted to CCM after being unable to protect his airway, thought to be due to excess benzodiazepenes with his withdrawal.   Past Medical History  Poorly controlled DM, ETOH abuse  Principal Problem:   MSSA bacteremia Active Problems:   DKA (diabetic ketoacidoses) (HCC)   Alcohol withdrawal (HCC)   Aspiration pneumonia (HCC)   Sepsis (HCC)   Acute encephalopathy   Acute respiratory failure (HCC)   Malnutrition of moderate degree   Significant Hospital Events   01/07/19 - admitted for MSSA positive  01/11/19 - intubated and transferred to MICU 01/12/19 - OR for I & D of upper back  01/13/19 - TEE   Consults:  ID  Surgery  Nutrition  Cardiology   Procedures:  01/11/2019-intubation 01/11/19 - Central Line 7/20 > OR for abscess drain  7/20 : I/O, foley placement 7/21 TEE  Significant Diagnostic Tests:  7/20 MRI Elbow >>  7/20 Ab XR Gastric tube courses into the stomach with its tip in the antropyloric region, appropriately positioned.  Ct Head Wo Contrast 7/20 CT Head w/o - no acute findings, remote infarcts, chronic axillary sinusitis 7/20 CT chest W/O-moderate layering pleural effusions with multisegmental atelectasis.  Dorsal cervical thoracic soft tissue inflammation 7/20/CT spine 11 x 5 x 6 cm abscess in the dorsal cervicothoracic soft tissues extending from the skin surface into the superficial musculature. There is extension to spinous processes without bony erosion. 7/21 CXR  Bilateral lower lobe airspace opacities and probable small layering effusions. Heart is borderline in  size. No acute bony abnormality. IMPRESSION: Bilateral lower lobe airspace opacities with layering effusions. No significant change since prior study.  7/20 CXR  No evidence of pneumothorax following central line placement. Bilateral pleural effusions over the bases.  7/19 CXR  Lines and tubes as above. Persistent bilateral airspace opacities similar to prior study. Bilateral pleural effusions with short interval increase in size.  7/19 CXR . IMPRESSION: Worsening multifocal airspace opacities.  7/18 CXR IMPRESSION: 1. New patchy airspace opacities in the right mid lung, right base and left retrocardiac region. Differential considerations include multifocal pneumonia, aspiration pneumonitis, and, in the appropriate clinical setting, atypical/viral pneumonia. 2. Cardiomegaly with mild vascular congestion but no overt pulmonary edema.  7/17 TTE -normal systolic function with EF 55 to 60%.  Left atrium mildly dilated.  Mild mitral annular calcification present.  No stenosis of aortic valve but with mild thickening and calcification.  7/17 UE U/S Focal area of soft tissue edema with surrounding Doppler flow in the medial aspect of left elbow which appears to be centered in the flexor musculature concerning for myositis. If there is further clinical concern regarding septic arthritis, recommend an MRI of the left elbow. .   Micro Data:  C. difficile PCR negative MRSA negative 01/07/19 - blood culture >> + MSSA (Resistant to cipro, clinda, erythro) 01/07/19 - Urine cx >> + staph aureus pan senstive (Resistant to cipro)  01/09/19 - Blood culture >> NG 01/10/19 - Urine Culture >> NG 01/12/19 - Blood culture >> NG 01/12/19 - Abscess culture >>   Antimicrobials:  7/20 Unasyn >>  7/16-17, 7/20 Vanc 7/16> CTX 7/16 > Ancef 7/15-7/16> Cefepime  Interim history/subjective:  Patient tolerated OR well yesterday. Had not spontaneously voided and 30 hours, I/O'd x1.  Repeat bladder scans significant for low urine  collection.  Patient was bolused 1 L LR and Foley was placed.  Objective   Temp:  [97.2 F (36.2 C)-98.6 F (37 C)] 97.2 F (36.2 C) (07/22 0748) Pulse Rate:  [51-84] 84 (07/22 0600) Resp:  [12-22] 18 (07/22 0600) BP: (83-131)/(45-76) 94/75 (07/22 0600) SpO2:  [97 %-100 %] 100 % (07/22 0752) Arterial Line BP: (72-132)/(49-95) 116/61 (07/22 0600) FiO2 (%):  [40 %] 40 % (07/22 0752) Weight:  [96.7 kg] 96.7 kg (07/22 0500)  CVP:  [8 mmHg-11 mmHg] 8 mmHg  Vent Mode: CPAP;PSV FiO2 (%):  [40 %] 40 % Set Rate:  [15 bmp-16 bmp] 15 bmp Vt Set:  [560 mL] 560 mL PEEP:  [5 cmH20] 5 cmH20 Pressure Support:  [5 cmH20] 5 cmH20 Plateau Pressure:  [15 cmH20] 15 cmH20  Intake/Output      07/21 0701 - 07/22 0700 07/22 0701 - 07/23 0700   I.V. (mL/kg) 2997.2 (31)    NG/GT 80    IV Piggyback 1009.9    Total Intake(mL/kg) 4087.1 (42.3)    Urine (mL/kg/hr) 1135 (0.5)    Emesis/NG output     Stool 0    Blood 10    Total Output 1145    Net +2942.1         Stool Occurrence 4 x       Filed Weights   01/12/19 0230 01/13/19 0500 01/14/19 0500  Weight: 88.2 kg 92.7 kg 96.7 kg   Examination: General: Lying in bed sleeping. Lungs: Clear to auscultation bilaterally.  Unable to appreciate posterior bases.  Respiratory rate 9. Cardiovascular: Regular rate and rhythm.  2+ BLE Abdomen: Soft, positive bowel sounds, nontender nondistended Extremities: Warm.  Inflatable boots bilaterally. Neuro: Patient is sedated. GU: Foley cath in place Back: Unable to appreciate at this time  Assessment & Plan:  ID MSSA Bacteremia likely due to soft tissue abscess on upper back.  Patient is status post I&D, postop day 1.  Will obtain TEE today to rule out vegetations. Per ID, patient will need 14-day course following negative cultures provide no vegetations.  Patient's fevers have resolved since white count is declining 11.9 today.  TEE today  Continue Unasyn  Blood culture if fever  MSK Left elbow patient  with ultrasound on day of admission (7/17) with finding concerning for myositis.    Follow-up MRI findings  PULM Acute hypoxic respiratory failure thought to be due to overmedication with benzodiazepines in setting of alcohol withdrawal.  Patient remains on vent at this time.  He is receiving IV fentanyl 75 mcg/h.  Previously on Precedex but his blood pressures did not tolerate.  Chest x-ray yesterday with bilateral fluid at bases.  He is net +13.3 L since admission.  LR is currently on at 75 mL/h.  Will repeat chest x-ray to compare with yesterday's patient is taking spontaneous breaths and extubation may resolve patient's agitation.  CXR?  IV Lasix 40 mg x 1 Aspiration pneumonia presumed aspiration pneumonia given findings at intubation and continued fevers.  Patient currently on Unasyn day 3.  Will need swallow study once extubated  CARDS AFIB, resolved  GI Currently on tube feeds vital AF 1.2 at 20 mL/h.  Goal is 75 mL/h. Diarrhea, resolved.   Nutrition nutrition on board.   Electrolytes within normal limits  Daily BMP w/ Mg and Phos  NEURO ETOH WITHDRAWAL patient is awake this morning.  He continues to have agitation overnight.  He is currently on fentanyl IV infusion at 75 mcg/h. Last CIWA 9. Today is day 7 of admisison. Will continue to check at this time.   CIWA protocol    ENDOCRINE 1. Diabetes -A1c 11.7.  CBGs in the mid 200s. Required 23 u novolog yesterday. Currently on detemir 20 units nightly with sliding scale every 4 hours. NPO this morning for TEE.  HEME 1. Anemia stable at baseline, 10.5  Best practice:  Diet: NPO Pain/Anxiety/Delirium protocol (if indicated): Place/Maintain arterial line **AND** sodium chloride, fentaNYL, ibuprofen, LORazepam  VAP protocol (if indicated): 30 degrees HOB  DVT prophylaxis: LOVENOX GI prophylaxis: PTX Glucose control: detemir and ssI  Mobility: BR Code Status: Full  Family Communication: Sister Disposition: ICU    Melene Planachel E. Kim, M.D.  PGY-2   Family Medicine  01/14/2019 10:18 AM  ----------------------------------------------------------------- Attending note: I have seen and examined the patient. History, labs and imaging reviewed.  Briefly this is a 63 year old with diabetes, EtOH abuse admitted with DKA, EtOH withdrawal and MSSA bacteremia, UTI  Underwent OR drainage of abscess Failed weaning trial due to agitation  Blood pressure 112/68, pulse 76, temperature (!) 97.2 F (36.2 C), temperature source Axillary, resp. rate (!) 8, height 5\' 9"  (1.753 m), weight 96.7 kg, SpO2 100 %. Gen:      No acute distress HEENT:  EOMI, sclera anicteric Neck:     No masses; no thyromegaly, ETT Lungs:    Clear to auscultation bilaterally; normal respiratory effort CV:         Regular rate and rhythm; no murmurs Abd:      + bowel sounds; soft, non-tender; no palpable masses, no distension Ext:    No edema; adequate peripheral perfusion Skin:      Warm and dry; no rash Neuro: Sedated, unarousable  Labs/Imaging personally reviewed, significant for Na 138, Bun/Cr- 22/0.81 WBC 11.9, Hb 10.6, Plts 274 No new imaging  Assessment/plan: Septic shock, MSSA bacteremia Wean down neo Continue unasyn TEE today  A. fib with RVR, now in normal sinus rhythm Off amiodarone  Respiratory failure PSV weans as tolerated Lasix 40 mg x 1  Diabetes Continue Levemir, SSI  The patient is critically ill with multiple organ systems failure and requires high complexity decision making for assessment and support, frequent evaluation and titration of therapies, application of advanced monitoring technologies and extensive interpretation of multiple databases.  Critical care time - 35 mins. This represents my time independent of the NPs time taking care of the pt.  Chilton GreathousePraveen Marquize Seib MD  Pulmonary and Critical Care 01/14/2019, 10:51 AM

## 2019-01-14 NOTE — Progress Notes (Signed)
Patient transported to MRI and returned to 3M05. NO complications. RT will continue to monitor.

## 2019-01-14 NOTE — Progress Notes (Signed)
Patient ID: Nichola SizerRobert Middlekauff, male   DOB: [birth year], 63 y.o.   MRN: [id number]    1 Day Post-Op  Subjective: Patient having a ton of anxiety on the vent right now.  Just given a fentanyl bolus.  Objective: Vital signs in last 24 hours: Temp:  [97.2 F (36.2 C)-98.6 F (37 C)] 97.2 F (36.2 C) (07/22 0748) Pulse Rate:  [51-84] 84 (07/22 0600) Resp:  [12-22] 18 (07/22 0600) BP: (83-131)/(45-76) 94/75 (07/22 0600) SpO2:  [97 %-100 %] 100 % (07/22 0752) Arterial Line BP: (72-132)/(49-95) 116/61 (07/22 0600) FiO2 (%):  [40 %] 40 % (07/22 0752) Weight:  [96.7 kg] 96.7 kg (07/22 0500) Last BM Date: 01/13/19  Intake/Output from previous day: 07/21 0701 - 07/22 0700 In: 4087.1 [I.V.:2997.2; NG/GT:80; IV Piggyback:1009.9] Out: 1145 [Urine:1135; Blood:10] Intake/Output this shift: No intake/output data recorded.  PE: Skin: neck dressing in place.  RN states that now is not a good time to roll him to look at his neck and he is panicking on the vent right now.  Lab Results:  Recent Labs    01/13/19 0434 01/14/19 0446  WBC 15.3* 11.9*  HGB 10.5* 10.6*  HCT 31.4* 32.0*  PLT 249 274   BMET Recent Labs    01/13/19 0434 01/14/19 0446  NA 136 138  K 3.7 4.2  CL 109 112*  CO2 19* 19*  GLUCOSE 167* 253*  BUN 13 22  CREATININE 0.76 0.81  CALCIUM 6.5* 6.8*   PT/INR Recent Labs    01/13/19 0434  LABPROT 17.9*  INR 1.5*   CMP     Component Value Date/Time   NA 138 01/14/2019 0446   K 4.2 01/14/2019 0446   CL 112 (H) 01/14/2019 0446   CO2 19 (L) 01/14/2019 0446   GLUCOSE 253 (H) 01/14/2019 0446   BUN 22 01/14/2019 0446   CREATININE 0.81 01/14/2019 0446   CALCIUM 6.8 (L) 01/14/2019 0446   PROT 5.6 (L) 01/14/2019 0446   ALBUMIN 1.1 (L) 01/14/2019 0446   AST 42 (H) 01/14/2019 0446   ALT 34 01/14/2019 0446   ALKPHOS 100 01/14/2019 0446   BILITOT 1.4 (H) 01/14/2019 0446   GFRNONAA >60 01/14/2019 0446   GFRAA >60 01/14/2019 0446   Lipase  No results found for:  LIPASE     Studies/Results: Dg Abd 1 View  Result Date: 01/12/2019 CLINICAL DATA:  Bedside gastric tube placement. EXAM: Portable ABDOMEN-1 VIEW COMPARISON:  None. FINDINGS: The gastric tube courses into the stomach with its tip in the antropyloric region. Visualized upper abdominal bowel gas pattern unremarkable. IMPRESSION: Gastric tube courses into the stomach with its tip in the antropyloric region, appropriately positioned. Electronically Signed   By: Hulan Saashomas  Lawrence M.D.   On: 01/12/2019 16:36   Dg Chest Port 1 View  Result Date: 01/13/2019 CLINICAL DATA:  Acute respiratory failure EXAM: PORTABLE CHEST 1 VIEW COMPARISON:  January 12, 2019 FINDINGS: Support devices are stable. Bilateral lower lobe airspace opacities and probable small layering effusions. Heart is borderline in size. No acute bony abnormality. IMPRESSION: Bilateral lower lobe airspace opacities with layering effusions. No significant change since prior study. Electronically Signed   By: Charlett NoseKevin  Dover M.D.   On: 01/13/2019 08:13   Dg Chest Port 1 View  Result Date: 01/12/2019 CLINICAL DATA:  Status post central line placement EXAM: PORTABLE CHEST 1 VIEW COMPARISON:  01/11/2019 FINDINGS: Endotracheal tube and gastric catheter are noted in satisfactory position. A new right jugular central line is noted at the cavoatrial  junction. No pneumothorax is seen. Bilateral pleural effusions are noted over the bases. IMPRESSION: No evidence of pneumothorax following central line placement. Bilateral pleural effusions over the bases. Electronically Signed   By: Inez Catalina M.D.   On: 01/12/2019 15:31    Anti-infectives: Anti-infectives (From admission, onward)   Start     Dose/Rate Route Frequency Ordered Stop   01/12/19 1400  Ampicillin-Sulbactam (UNASYN) 3 g in sodium chloride 0.9 % 100 mL IVPB     3 g 200 mL/hr over 30 Minutes Intravenous Every 6 hours 01/12/19 1101     01/12/19 1000  vancomycin (VANCOCIN) 1,250 mg in sodium  chloride 0.9 % 250 mL IVPB  Status:  Discontinued     1,250 mg 166.7 mL/hr over 90 Minutes Intravenous Every 12 hours 01/12/19 0115 01/12/19 1101   01/12/19 0115  vancomycin (VANCOCIN) 1,500 mg in sodium chloride 0.9 % 500 mL IVPB     1,500 mg 250 mL/hr over 120 Minutes Intravenous  Once 01/12/19 0100 01/12/19 0514   01/10/19 1230  meropenem (MERREM) 1 g in sodium chloride 0.9 % 100 mL IVPB  Status:  Discontinued     1 g 200 mL/hr over 30 Minutes Intravenous Every 8 hours 01/10/19 1146 01/12/19 1101   01/08/19 2200  ceFAZolin (ANCEF) IVPB 2g/100 mL premix  Status:  Discontinued     2 g 200 mL/hr over 30 Minutes Intravenous Every 8 hours 01/08/19 1030 01/10/19 1146   01/08/19 1800  vancomycin (VANCOCIN) 1,500 mg in sodium chloride 0.9 % 500 mL IVPB  Status:  Discontinued     1,500 mg 250 mL/hr over 120 Minutes Intravenous Every 24 hours 01/07/19 1830 01/08/19 1030   01/08/19 0630  cefTRIAXone (ROCEPHIN) 2 g in sodium chloride 0.9 % 100 mL IVPB  Status:  Discontinued     2 g 200 mL/hr over 30 Minutes Intravenous Daily 01/08/19 0536 01/08/19 1030   01/08/19 0600  ceFEPIme (MAXIPIME) 2 g in sodium chloride 0.9 % 100 mL IVPB  Status:  Discontinued     2 g 200 mL/hr over 30 Minutes Intravenous Every 12 hours 01/07/19 1830 01/07/19 2132   01/07/19 1745  vancomycin (VANCOCIN) 2,000 mg in sodium chloride 0.9 % 500 mL IVPB     2,000 mg 250 mL/hr over 120 Minutes Intravenous  Once 01/07/19 1738 01/07/19 2013   01/07/19 1745  ceFEPIme (MAXIPIME) 2 g in sodium chloride 0.9 % 100 mL IVPB     2 g 200 mL/hr over 30 Minutes Intravenous  Once 01/07/19 1738 01/07/19 1904       Assessment/Plan    DKA (diabetic ketoacidoses) (HCC)   Alcohol withdrawal (HCC)   Aspiration pneumonia ([place])   Sepsis ([place])   Acute encephalopathy   Acute respiratory failure ([place])  Sepsis likely 2/2  POD 1, s/p I&D of Posterior neck/upper back abscess -start NS WD dressing changes to wound today -cont abx therapy -will  follow to check on wound later to assure no further need for debridement. -Cx gram + cocci  FEN - TFs VTE - SCDs ID - unasyn   LOS: 7 days    Henreitta Cea , [facility] 01/14/2019, 8:13 AM Pager: [phone]

## 2019-01-15 ENCOUNTER — Inpatient Hospital Stay (HOSPITAL_COMMUNITY): Payer: Medicaid Other

## 2019-01-15 DIAGNOSIS — L03114 Cellulitis of left upper limb: Secondary | ICD-10-CM

## 2019-01-15 DIAGNOSIS — M609 Myositis, unspecified: Secondary | ICD-10-CM

## 2019-01-15 LAB — CULTURE, RESPIRATORY W GRAM STAIN

## 2019-01-15 LAB — GLUCOSE, CAPILLARY
Glucose-Capillary: 188 mg/dL — ABNORMAL HIGH (ref 70–99)
Glucose-Capillary: 125 mg/dL — ABNORMAL HIGH (ref 70–99)
Glucose-Capillary: 100 mg/dL — ABNORMAL HIGH (ref 70–99)
Glucose-Capillary: 122 mg/dL — ABNORMAL HIGH (ref 70–99)
Glucose-Capillary: 138 mg/dL — ABNORMAL HIGH (ref 70–99)

## 2019-01-15 LAB — CBC WITH DIFFERENTIAL/PLATELET
Abs Immature Granulocytes: 0.07 10*3/uL (ref 0.00–0.07)
Basophils Absolute: 0 10*3/uL (ref 0.0–0.1)
Basophils Relative: 0 %
Eosinophils Absolute: 0 10*3/uL (ref 0.0–0.5)
Eosinophils Relative: 0 %
HCT: 27.3 % — ABNORMAL LOW (ref 39.0–52.0)
Hemoglobin: 9 g/dL — ABNORMAL LOW (ref 13.0–17.0)
Immature Granulocytes: 1 %
Lymphocytes Relative: 13 %
Lymphs Abs: 1.2 10*3/uL (ref 0.7–4.0)
MCH: 32.6 pg (ref 26.0–34.0)
MCHC: 33 g/dL (ref 30.0–36.0)
MCV: 98.9 fL (ref 80.0–100.0)
Monocytes Absolute: 0.3 10*3/uL (ref 0.1–1.0)
Monocytes Relative: 3 %
Neutro Abs: 7.7 10*3/uL (ref 1.7–7.7)
Neutrophils Relative %: 83 %
Platelets: 236 10*3/uL (ref 150–400)
RBC: 2.76 MIL/uL — ABNORMAL LOW (ref 4.22–5.81)
RDW: 13.4 % (ref 11.5–15.5)
WBC: 9.2 10*3/uL (ref 4.0–10.5)
nRBC: 0 % (ref 0.0–0.2)

## 2019-01-15 LAB — COMPREHENSIVE METABOLIC PANEL
ALT: 30 U/L (ref 0–44)
AST: 51 U/L — ABNORMAL HIGH (ref 15–41)
Albumin: 1.1 g/dL — ABNORMAL LOW (ref 3.5–5.0)
Alkaline Phosphatase: 111 U/L (ref 38–126)
Anion gap: 7 (ref 5–15)
BUN: 32 mg/dL — ABNORMAL HIGH (ref 8–23)
CO2: 23 mmol/L (ref 22–32)
Calcium: 7.1 mg/dL — ABNORMAL LOW (ref 8.9–10.3)
Chloride: 110 mmol/L (ref 98–111)
Creatinine, Ser: 0.91 mg/dL (ref 0.61–1.24)
GFR calc Af Amer: >60 mL/min (ref >60)
GFR calc non Af Amer: >60 mL/min (ref >60)
Glucose, Bld: 177 mg/dL — ABNORMAL HIGH (ref 70–99)
Potassium: 3.7 mmol/L (ref 3.5–5.1)
Sodium: 140 mmol/L (ref 135–145)
Total Bilirubin: 0.9 mg/dL (ref 0.3–1.2)
Total Protein: 5.3 g/dL — ABNORMAL LOW (ref 6.5–8.1)

## 2019-01-15 LAB — PHOSPHORUS: Phosphorus: 3 mg/dL (ref 2.5–4.6)

## 2019-01-15 LAB — CK: Total CK: 62 U/L (ref 49–397)

## 2019-01-15 LAB — MAGNESIUM: Magnesium: 2.1 mg/dL (ref 1.7–2.4)

## 2019-01-15 MED ORDER — FUROSEMIDE 10 MG/ML IJ SOLN
40.0000 mg | Freq: Two times a day (BID) | INTRAMUSCULAR | Status: DC
  Administered 2019-01-15: 09:00:00 40 mg via INTRAVENOUS
  Filled 2019-01-15: qty 4

## 2019-01-15 MED ORDER — FUROSEMIDE 10 MG/ML IJ SOLN
60.0000 mg | Freq: Two times a day (BID) | INTRAMUSCULAR | Status: DC
  Administered 2019-01-15 – 2019-01-16 (×2): 60 mg via INTRAVENOUS
  Filled 2019-01-15 (×2): qty 6

## 2019-01-15 MED ORDER — MIDAZOLAM HCL 2 MG/2ML IJ SOLN
INTRAMUSCULAR | Status: AC
  Filled 2019-01-15: qty 4

## 2019-01-15 MED ORDER — DEXMEDETOMIDINE HCL IN NACL 400 MCG/100ML IV SOLN
0.4000 ug/kg/h | INTRAVENOUS | Status: DC
  Administered 2019-01-15: 0.9 ug/kg/h via INTRAVENOUS
  Administered 2019-01-15: 10:00:00 0.4 ug/kg/h via INTRAVENOUS
  Administered 2019-01-15: 0.8 ug/kg/h via INTRAVENOUS
  Administered 2019-01-16 (×2): 1 ug/kg/h via INTRAVENOUS
  Administered 2019-01-16: 0.9 ug/kg/h via INTRAVENOUS
  Administered 2019-01-16: 1 ug/kg/h via INTRAVENOUS
  Administered 2019-01-17 (×2): 1.2 ug/kg/h via INTRAVENOUS
  Administered 2019-01-17: 05:00:00 0.8 ug/kg/h via INTRAVENOUS
  Administered 2019-01-17 – 2019-01-18 (×4): 1.2 ug/kg/h via INTRAVENOUS
  Filled 2019-01-15 (×15): qty 100

## 2019-01-15 MED ORDER — SODIUM CHLORIDE 0.9 % IV SOLN: INTRAVENOUS | Status: DC

## 2019-01-15 MED ORDER — FENTANYL CITRATE (PF) 100 MCG/2ML IJ SOLN
INTRAMUSCULAR | Status: AC
  Filled 2019-01-15: qty 4

## 2019-01-15 NOTE — Progress Notes (Addendum)
[facility]   Reason for visit: Follow up on MSSA bacteremia  Interval History: Patient afebrile (last febrile 7/20), with down-trending WBC. Patient on Unasyn for MSSA and aspiration pneumonia coverage. Patient had I&D on 7/21 and surgical wound cultures show staph aureus. Repeat cultures from 7/20 show no growth at 3 days. MR left arm reveals cellulitis and myositis, no bone abnormality.  Subjective: Unable to be obtained, patient intubated.  Physical Exam: Constitutional: Patient intubated, somnolent Vitals:   01/15/19 1135 01/15/19 1200  BP:  103/69  Pulse:  63  Resp:  15  Temp:  99.8 F (37.7 C)  SpO2: 100% 100%  Respiratory: CTAB Cardiovascular: RRR, no m/r/g GI: Soft, non-distended  Review of Systems: Unable to be obtained, patient intubated  Lab Results  Component Value Date   WBC 9.2 01/15/2019   HGB 9.0 (L) 01/15/2019   HCT 27.3 (L) 01/15/2019   MCV 98.9 01/15/2019   PLT 236 01/15/2019    Lab Results  Component Value Date   CREATININE 0.91 01/15/2019   BUN 32 (H) 01/15/2019   NA 140 01/15/2019   K 3.7 01/15/2019   CL 110 01/15/2019   CO2 23 01/15/2019    Lab Results  Component Value Date   ALT 30 01/15/2019   AST 51 (H) 01/15/2019   ALKPHOS 111 01/15/2019     Microbiology: Recent Results (from the past 240 hour(s))  Culture, blood (Routine x 2)     Status: Abnormal   Collection Time: 01/07/19 11:05 AM   Specimen: BLOOD  Result Value Ref Range Status   Specimen Description BLOOD BLOOD RIGHT FOREARM  Final   Special Requests   Final    BOTTLES DRAWN AEROBIC AND ANAEROBIC Blood Culture adequate volume   Culture  Setup Time   Final    GRAM POSITIVE COCCI IN CLUSTERS IN BOTH AEROBIC AND ANAEROBIC BOTTLES CRITICAL RESULT CALLED TO, READ BACK BY AND VERIFIED WITH: Antoine PrimasE. Martin PharmD 10:15 01/08/19 ([email or web address]) Performed at [facility], [address] [place], [place] [address]    Culture STAPHYLOCOCCUS AUREUS (A)   Final   Report Status 01/10/2019 FINAL  Final   Organism ID, Bacteria STAPHYLOCOCCUS AUREUS  Final      Susceptibility   Staphylococcus aureus - MIC*    CIPROFLOXACIN >=8 RESISTANT Resistant     ERYTHROMYCIN >=8 RESISTANT Resistant     GENTAMICIN <=0.5 SENSITIVE Sensitive     OXACILLIN 0.5 SENSITIVE Sensitive     TETRACYCLINE <=1 SENSITIVE Sensitive     VANCOMYCIN 1 SENSITIVE Sensitive     TRIMETH/SULFA <=10 SENSITIVE Sensitive     CLINDAMYCIN >=8 RESISTANT Resistant     RIFAMPIN <=0.5 SENSITIVE Sensitive     Inducible Clindamycin NEGATIVE Sensitive     * STAPHYLOCOCCUS AUREUS  Blood Culture ID Panel (Reflexed)     Status: Abnormal   Collection Time: 01/07/19 11:05 AM  Result Value Ref Range Status   Enterococcus species NOT DETECTED NOT DETECTED Final   Listeria monocytogenes NOT DETECTED NOT DETECTED Final   Staphylococcus species DETECTED (A) NOT DETECTED Final    Comment: CRITICAL RESULT CALLED TO, READ BACK BY AND VERIFIED WITH: Antoine PrimasE. Martin PharmD 10:15 01/08/19 ([email or web address])    Staphylococcus aureus (BCID) DETECTED (A) NOT DETECTED Final    Comment: Methicillin (oxacillin) susceptible Staphylococcus aureus (MSSA). Preferred therapy is anti staphylococcal beta lactam antibiotic (Cefazolin or Nafcillin), unless clinically contraindicated. CRITICAL RESULT CALLED TO, READ BACK BY AND VERIFIED WITH: Antoine PrimasE. Martin PharmD  10:15 01/08/19 ([email or web address])    Methicillin resistance NOT DETECTED NOT DETECTED Final   Streptococcus species NOT DETECTED NOT DETECTED Final   Streptococcus agalactiae NOT DETECTED NOT DETECTED Final   Streptococcus pneumoniae NOT DETECTED NOT DETECTED Final   Streptococcus pyogenes NOT DETECTED NOT DETECTED Final   Acinetobacter baumannii NOT DETECTED NOT DETECTED Final   Enterobacteriaceae species NOT DETECTED NOT DETECTED Final   Enterobacter cloacae complex NOT DETECTED NOT DETECTED Final   Escherichia coli NOT DETECTED NOT DETECTED Final   Klebsiella oxytoca NOT  DETECTED NOT DETECTED Final   Klebsiella pneumoniae NOT DETECTED NOT DETECTED Final   Proteus species NOT DETECTED NOT DETECTED Final   Serratia marcescens NOT DETECTED NOT DETECTED Final   Haemophilus influenzae NOT DETECTED NOT DETECTED Final   Neisseria meningitidis NOT DETECTED NOT DETECTED Final   Pseudomonas aeruginosa NOT DETECTED NOT DETECTED Final   Candida albicans NOT DETECTED NOT DETECTED Final   Candida glabrata NOT DETECTED NOT DETECTED Final   Candida krusei NOT DETECTED NOT DETECTED Final   Candida parapsilosis NOT DETECTED NOT DETECTED Final   Candida tropicalis NOT DETECTED NOT DETECTED Final    Comment: Performed at [facility], [address] [place], [place] [address]  Culture, blood (Routine x 2)     Status: Abnormal   Collection Time: 01/07/19  5:25 PM   Specimen: BLOOD LEFT ARM  Result Value Ref Range Status   Specimen Description BLOOD LEFT ARM  Final   Special Requests   Final    BOTTLES DRAWN AEROBIC AND ANAEROBIC Blood Culture results may not be optimal due to an inadequate volume of blood received in culture bottles   Culture  Setup Time   Final    GRAM POSITIVE COCCI AEROBIC BOTTLE ONLY CRITICAL RESULT CALLED TO, READ BACK BY AND VERIFIED WITH: PHARMD E MARTIN [phone] AT 1107 AM BY CM    Culture (A)  Final    STAPHYLOCOCCUS AUREUS SUSCEPTIBILITIES PERFORMED ON PREVIOUS CULTURE WITHIN THE LAST 5 DAYS. Performed at [facility], [address] [place], [place] [address]    Report Status 01/10/2019 FINAL  Final  SARS Coronavirus 2 (CEPHEID - Performed in [facility] lab), Hosp Order     Status: None   Collection Time: 01/07/19  5:35 PM   Specimen: Nasopharyngeal Swab  Result Value Ref Range Status   SARS Coronavirus 2 NEGATIVE NEGATIVE Final    Comment: (NOTE) If result is NEGATIVE SARS-CoV-2 target nucleic acids are NOT DETECTED. The SARS-CoV-2 RNA is generally detectable in upper and lower  respiratory specimens during the  acute phase of infection. The lowest  concentration of SARS-CoV-2 viral copies this assay can detect is 250  copies / mL. A negative result does not preclude SARS-CoV-2 infection  and should not be used as the sole basis for treatment or other  patient management decisions.  A negative result may occur with  improper specimen collection / handling, submission of specimen other  than nasopharyngeal swab, presence of viral mutation(s) within the  areas targeted by this assay, and inadequate number of viral copies  (<250 copies / mL). A negative result must be combined with clinical  observations, patient history, and epidemiological information. If result is POSITIVE SARS-CoV-2 target nucleic acids are DETECTED. The SARS-CoV-2 RNA is generally detectable in upper and lower  respiratory specimens dur ing the acute phase of infection.  Positive  results are indicative of active infection with SARS-CoV-2.  Clinical  correlation with patient  history and other diagnostic information is  necessary to determine patient infection status.  Positive results do  not rule out bacterial infection or co-infection with other viruses. If result is PRESUMPTIVE POSTIVE SARS-CoV-2 nucleic acids MAY BE PRESENT.   A presumptive positive result was obtained on the submitted specimen  and confirmed on repeat testing.  While 2019 novel coronavirus  (SARS-CoV-2) nucleic acids may be present in the submitted sample  additional confirmatory testing may be necessary for epidemiological  and / or clinical management purposes  to differentiate between  SARS-CoV-2 and other Sarbecovirus currently known to infect humans.  If clinically indicated additional testing with an alternate test  methodology [phone]([id number]) is advised. The SARS-CoV-2 RNA is generally  detectable in upper and lower respiratory sp ecimens during the acute  phase of infection. The expected result is Negative. Fact Sheet for Patients:   BoilerBrush.com.cyhttps://www.fda.gov/media/136312/download Fact Sheet for Healthcare Providers: https://pope.com/https://www.fda.gov/media/136313/download This test is not yet approved or cleared by the [place] FDA and has been authorized for detection and/or diagnosis of SARS-CoV-2 by FDA under an Emergency Use Authorization (EUA).  This EUA will remain in effect (meaning this test can be used) for the duration of the COVID-19 declaration under Section 564(b)(1) of the Act, 21 U.S.C. section 360bbb-3(b)(1), unless the authorization is terminated or revoked sooner. Performed at [facility], [address] [place], [place] [address]   Urine culture     Status: Abnormal   Collection Time: 01/07/19  6:22 PM   Specimen: In/Out Cath Urine  Result Value Ref Range Status   Specimen Description IN/OUT CATH URINE  Final   Special Requests NONE  Final   Culture (A)  Final    40,000 COLONIES/mL STAPHYLOCOCCUS AUREUS 20,000 COLONIES/mL STAPHYLOCOCCUS SPECIES (COAGULASE NEGATIVE) 1,000 COLONIES/mL VIRIDANS STREPTOCOCCUS Standardized susceptibility testing for this organism is not available. Performed at [facility], [address] [place], [place] [address]    Report Status 01/10/2019 FINAL  Final   Organism ID, Bacteria STAPHYLOCOCCUS AUREUS (A)  Final   Organism ID, Bacteria STAPHYLOCOCCUS SPECIES (COAGULASE NEGATIVE) (A)  Final      Susceptibility   Staphylococcus aureus - MIC*    CIPROFLOXACIN >=8 RESISTANT Resistant     GENTAMICIN <=0.5 SENSITIVE Sensitive     NITROFURANTOIN <=16 SENSITIVE Sensitive     OXACILLIN 0.5 SENSITIVE Sensitive     TETRACYCLINE <=1 SENSITIVE Sensitive     VANCOMYCIN 1 SENSITIVE Sensitive     TRIMETH/SULFA <=10 SENSITIVE Sensitive     CLINDAMYCIN <=0.25 SENSITIVE Sensitive     RIFAMPIN <=0.5 SENSITIVE Sensitive     Inducible Clindamycin NEGATIVE Sensitive     * 40,000 COLONIES/mL STAPHYLOCOCCUS AUREUS   Staphylococcus species (coagulase negative) - MIC*     CIPROFLOXACIN <=0.5 SENSITIVE Sensitive     GENTAMICIN <=0.5 SENSITIVE Sensitive     NITROFURANTOIN 32 SENSITIVE Sensitive     OXACILLIN <=0.25 SENSITIVE Sensitive     TETRACYCLINE <=1 SENSITIVE Sensitive     VANCOMYCIN <=0.5 SENSITIVE Sensitive     TRIMETH/SULFA <=10 SENSITIVE Sensitive     CLINDAMYCIN <=0.25 SENSITIVE Sensitive     RIFAMPIN <=0.5 SENSITIVE Sensitive     Inducible Clindamycin NEGATIVE Sensitive     * 20,000 COLONIES/mL STAPHYLOCOCCUS SPECIES (COAGULASE NEGATIVE)  Culture, blood (routine x 2)     Status: None   Collection Time: 01/09/19  8:25 AM   Specimen: BLOOD LEFT HAND  Result Value Ref Range Status   Specimen Description BLOOD LEFT HAND  Final   Special Requests   Final    BOTTLES DRAWN AEROBIC ONLY Blood Culture adequate volume   Culture   Final    NO GROWTH 5 DAYS Performed at [facility], [address] [place], [place] [address]    Report Status 01/14/2019 FINAL  Final  Culture, blood (routine x 2)     Status: None   Collection Time: 01/09/19  8:26 AM   Specimen: BLOOD LEFT HAND  Result Value Ref Range Status   Specimen Description BLOOD LEFT HAND  Final   Special Requests   Final    BOTTLES DRAWN AEROBIC ONLY Blood Culture adequate volume   Culture   Final    NO GROWTH 5 DAYS Performed at [facility], [address], [place], [place] [address]    Report Status 01/14/2019 FINAL  Final  Culture, Urine     Status: None   Collection Time: 01/10/19 11:24 AM   Specimen: Urine, Random  Result Value Ref Range Status   Specimen Description URINE, RANDOM  Final   Special Requests NONE  Final   Culture   Final    NO GROWTH Performed at [facility], [address], [place], [place] [address]    Report Status 01/11/2019 FINAL  Final  C difficile quick scan w PCR reflex     Status: None   Collection Time: 01/11/19  3:44 PM   Specimen: STOOL  Result Value Ref Range Status   C Diff antigen NEGATIVE NEGATIVE Final   C Diff toxin  NEGATIVE NEGATIVE Final   C Diff interpretation No C. difficile detected.  Final    Comment: Performed at [facility], [address] [place], [place] [address]  MRSA PCR Screening     Status: None   Collection Time: 01/11/19 11:42 PM   Specimen: Nasal Mucosa; Nasopharyngeal  Result Value Ref Range Status   MRSA by PCR NEGATIVE NEGATIVE Final    Comment:        The GeneXpert MRSA Assay (FDA approved for NASAL specimens only), is one component of a comprehensive MRSA colonization surveillance program. It is not intended to diagnose MRSA infection nor to guide or monitor treatment for MRSA infections. Performed at [facility], [address], [place], [place] [address]   Culture, blood (routine x 2)     Status: None (Preliminary result)   Collection Time: 01/12/19  1:43 AM   Specimen: BLOOD LEFT HAND  Result Value Ref Range Status   Specimen Description BLOOD LEFT HAND  Final   Special Requests   Final    BOTTLES DRAWN AEROBIC ONLY Blood Culture results may not be optimal due to an inadequate volume of blood received in culture bottles   Culture   Final    NO GROWTH 3 DAYS Performed at [facility], [address], [place], [place] [address]    Report Status PENDING  Incomplete  Culture, blood (routine x 2)     Status: None (Preliminary result)   Collection Time: 01/12/19  1:50 AM   Specimen: BLOOD RIGHT HAND  Result Value Ref Range Status   Specimen Description BLOOD RIGHT HAND  Final   Special Requests   Final    BOTTLES DRAWN AEROBIC ONLY Blood Culture adequate volume   Culture   Final    NO GROWTH 3 DAYS Performed at [facility], [address] [place], [place] [address]    Report Status  PENDING  Incomplete  Culture, respiratory (non-expectorated)     Status: None   Collection Time: 01/12/19 12:47 PM   Specimen: Tracheal Aspirate; Respiratory  Result Value Ref Range Status   Specimen Description TRACHEAL ASPIRATE  Final   Special  Requests NONE  Final   Gram Stain   Final    RARE WBC PRESENT, PREDOMINANTLY PMN NO ORGANISMS SEEN Performed at [facility], [place] [address], [place], [place] [address]    Culture FEW STENOTROPHOMONAS MALTOPHILIA  Final   Report Status 01/15/2019 FINAL  Final   Organism ID, Bacteria STENOTROPHOMONAS MALTOPHILIA  Final      Susceptibility   Stenotrophomonas maltophilia - MIC*    LEVOFLOXACIN 2 SENSITIVE Sensitive     TRIMETH/SULFA 80 RESISTANT Resistant     * FEW STENOTROPHOMONAS MALTOPHILIA  Surgical PCR screen     Status: Abnormal   Collection Time: 01/13/19  9:00 AM   Specimen: Nasal Mucosa; Nasal Swab  Result Value Ref Range Status   MRSA, PCR NEGATIVE NEGATIVE Final   Staphylococcus aureus POSITIVE (A) NEGATIVE Final    Comment: (NOTE) The Xpert SA Assay (FDA approved for NASAL specimens in patients 20 years of age and older), is one component of a comprehensive surveillance program. It is not intended to diagnose infection nor to guide or monitor treatment. Performed at [facility], [place] [address] [place], [place] [address]   Aerobic/Anaerobic Culture (surgical/deep wound)     Status: Abnormal (Preliminary result)   Collection Time: 01/13/19 12:37 PM   Specimen: Abscess  Result Value Ref Range Status   Specimen Description ABSCESS UPPER BACK  Final   Special Requests NONE  Final   Gram Stain   Final    NO WBC SEEN FEW GRAM POSITIVE COCCI IN PAIRS Performed at [facility], [place] [address], [place],  [address]    Culture (A)  Final    STAPHYLOCOCCUS AUREUS NO ANAEROBES ISOLATED; CULTURE IN PROGRESS FOR 5 DAYS    Report Status PENDING  Incomplete   Organism ID, Bacteria STAPHYLOCOCCUS AUREUS  Final      Susceptibility   Staphylococcus aureus - MIC*    CIPROFLOXACIN >=8 RESISTANT Resistant     ERYTHROMYCIN <=0.25 SENSITIVE Sensitive     GENTAMICIN <=0.5 SENSITIVE Sensitive     OXACILLIN 0.5 SENSITIVE Sensitive     TETRACYCLINE <=1  SENSITIVE Sensitive     VANCOMYCIN <=0.5 SENSITIVE Sensitive     TRIMETH/SULFA <=10 SENSITIVE Sensitive     CLINDAMYCIN <=0.25 SENSITIVE Sensitive     RIFAMPIN <=0.5 SENSITIVE Sensitive     Inducible Clindamycin NEGATIVE Sensitive     * STAPHYLOCOCCUS AUREUS    Impression:  63 yo male with PMH of uncontrolled DM who presented with weakness, fever, tachycardia, elevated lactic acid consistent with sepsis and found to have MSSA bacteremia. Patient is s/p I&D of upper back abscess. Patient not currently requiring pressors.  Plan: 1. Continue Unasyn - providing coverage for MSSA bacteremia, cellulitis/myositis, and possible aspiration pneumonia 2. [place] for PICC line - blood cultures are negative at 72 hours 3. TEE ordered to evaluate for vegetations

## 2019-01-15 NOTE — Progress Notes (Signed)
Dr. Vaughan Browner made aware of low UOP. 40cc on hour and 20cc the next hour. Urine still amber colored. No new orders given.

## 2019-01-15 NOTE — Progress Notes (Addendum)
NAME:  Frederick SizerRobert Moody, MRN:  [id number], DOB:  29-Mar-1956, LOS: 8 ADMISSION DATE:  01/07/2019, CONSULTATION DATE:  01/11/19 CHIEF COMPLAINT:  Encephaolpathy   Brief History   This is a 63 yo with history of uncontrolled DM and etoh abuse who presents for weakness and falls. Found to have etoh withdrawal and DKA (resolved) and MSSA bacteremia likely source is abscess on upper back. Patient was admitted to [facility]CM after being unable to protect his airway, thought to be due to excess benzodiazepenes with his withdrawal.   Past Medical History  Poorly controlled DM, ETOH abuse  Principal Problem:   MSSA bacteremia Active Problems:   DKA (diabetic ketoacidoses) (HCC)   Alcohol withdrawal (HCC)   Aspiration pneumonia (HCC)   Sepsis (HCC)   Acute encephalopathy   Acute respiratory failure (HCC)   Malnutrition of moderate degree   Significant Hospital Events   01/07/19 - admitted for MSSA positive  01/11/19 - intubated and transferred to MICU 01/12/19 - OR for I & D of upper back   Consults:  ID  Surgery  Nutrition  Cardiology   Procedures:  01/11/2019-intubation 01/11/19 - Central Line 7/20 > OR for abscess drain  7/20 : I/O, foley placement   Significant Diagnostic Tests:  7/20 MRI Elbow >>  7/20 Ab XR Gastric tube courses into the stomach with its tip in the antropyloric region, appropriately positioned.  Ct Head Wo Contrast 7/20 CT Head w/o - no acute findings, remote infarcts, chronic axillary sinusitis 7/20 CT chest W/O-moderate layering pleural effusions with multisegmental atelectasis.  Dorsal cervical thoracic soft tissue inflammation 7/20/CT spine 11 x 5 x 6 cm abscess in the dorsal cervicothoracic soft tissues extending from the skin surface into the superficial musculature. There is extension to spinous processes without bony erosion. 7/21 CXR  Bilateral lower lobe airspace opacities and probable small layering effusions. Heart is borderline in size. No acute bony  abnormality. IMPRESSION: Bilateral lower lobe airspace opacities with layering effusions. No significant change since prior study.  7/20 CXR  No evidence of pneumothorax following central line placement. Bilateral pleural effusions over the bases.  7/19 CXR  Lines and tubes as above. Persistent bilateral airspace opacities similar to prior study. Bilateral pleural effusions with short interval increase in size.  7/19 CXR . IMPRESSION: Worsening multifocal airspace opacities.  7/18 CXR IMPRESSION: 1. New patchy airspace opacities in the right mid lung, right base and left retrocardiac region. Differential considerations include multifocal pneumonia, aspiration pneumonitis, and, in the appropriate clinical setting, atypical/viral pneumonia. 2. Cardiomegaly with mild vascular congestion but no overt pulmonary edema.  7/17 TTE -normal systolic function with EF 55 to 60%.  Left atrium mildly dilated.  Mild mitral annular calcification present.  No stenosis of aortic valve but with mild thickening and calcification.  7/17 UE U/S Focal area of soft tissue edema with surrounding Doppler flow in the medial aspect of left elbow which appears to be centered in the flexor musculature concerning for myositis. If there is further clinical concern regarding septic arthritis, recommend an MRI of the left elbow. .   7/22 left upper extremity MRI -forearm: Extensive muscle and subcutaneous edema visualized throughout the left arm consistent with cellulitis and myositis.  Humerus: Inflammation spreads to shoulder girdle dural and left upper arm and left lateral chest consistent with severe myositis. Micro Data:  C. difficile PCR negative MRSA negative 01/07/19 - blood culture >> + MSSA (Resistant to cipro, clinda, erythro) 01/07/19 - Urine cx >> + staph aureus  pan senstive (Resistant to cipro)  01/09/19 - Blood culture >> NG 01/10/19 - Urine Culture >> NG 01/12/19 - Blood culture >> NG x 3 adys  01/12/19 - Abscess culture  >> staph, susceptibilities to follow   Antimicrobials:  7/20 Unasyn >>  7/16-17, 7/20 Vanc 7/16> CTX 7/16 > Ancef 7/15-7/16> Cefepime  Interim history/subjective:  Patient went for MRI yesterday.  This morning, he appears to be agitated.  Objective   Temp:  [97.9 F (36.6 C)-99.4 F (37.4 C)] 97.9 F (36.6 C) (07/23 0400) Pulse Rate:  [59-113] 86 (07/23 0743) Resp:  [8-26] 26 (07/23 0743) BP: (77-129)/(45-85) 90/62 (07/23 0600) SpO2:  [98 %-100 %] 100 % (07/23 0743) Arterial Line BP: (65-111)/(46-90) 65/61 (07/22 2100) FiO2 (%):  [30 %-40 %] 40 % (07/23 0743) Weight:  [96 kg] 96 kg (07/23 0500)  CVP:  [8 mmHg-14 mmHg] 14 mmHg  Vent Mode: PRVC FiO2 (%):  [30 %-40 %] 40 % Set Rate:  [15 bmp] 15 bmp Vt Set:  [560 mL] 560 mL PEEP:  [5 cmH20] 5 cmH20 Plateau Pressure:  [14 cmH20-18 cmH20] 18 cmH20  Intake/Output      07/22 0701 - 07/23 0700 07/23 0701 - 07/24 0700   I.V. (mL/kg) 1946.7 (20.3)    NG/GT 258.8    IV Piggyback 349.7    Total Intake(mL/kg) 2555.2 (26.6)    Urine (mL/kg/hr) 660 (0.3)    Stool     Blood     Total Output 660    Net +1895.2            Filed Weights   01/13/19 0500 01/14/19 0500 01/15/19 0500  Weight: 92.7 kg 96.7 kg 96 kg   Examination: General: Lying in bed sleeping.  Lungs: Clear to auscultation bilaterally.  Unable to appreciate posterior bases.  Respiratory rate 9. Cardiovascular: Regular rate and rhythm.  2+ BLE Abdomen: Soft, positive bowel sounds, nontender nondistended Extremities: Warm.  Inflatable boots bilaterally. Neuro: Opens eyes and tracks.  Responsive to commands intermittently.  Unable to participate in CAM ICU assessment. GU: Foley cath in place Back: Unable to appreciate at this time  Assessment & Plan:   MSSA Bacteremia likely due to soft tissue abscess on upper back, s/p drainage in the OR. TEE will not be obtained at this time per cardiology. Per ID, patient will need 14-day course following negative cultures  provide no vegetations.  Patient's fevers have resolved since white count is declining 11.9 today. -Continue Unasyn, with new trach aspirate finding and mri finding -Blood culture if fever   Severe Myositis & cellulitis of LUE  Left elbow patient with ultrasound on day of admission (7/17) with finding concerning for myositis.  MRI findings show severe myositis that extend to the shoulder girdle and left upper chest wall.  There is subcutaneous inflammation consistent with cellulitis as well.  Unclear etiology.  Patient's sister reports that he did fall the day that he went to the hospital onto his left elbow, so possibly due to trauma. Hands are warm and pt can squeeze hands bilaterally when asked.  - Monitor for compartment syndrome - Monitor CK  - Consult ortho to look at MRI for possible bursitis and need to drain. Look at arm   Acute hypoxic respiratory failure Patient remains on vent at this time.  Possibly attempt to extubate today. CAM ICU assessment confirms delirium.  Previously on Precedex but became bradycardic.  Patient was also septic at that time.  Will try Precedex in the setting  of delirium fentanyl.  Patient received IV 40 mg of Lasix yesterday and did not have great response.  He is up 14 L since admission.  We will repeat chest x-ray to determine if worsening pulmonary edema if decline in respiratory status.  Will dose Lasix at 60 mg today. -IV Lasix 60 mg BID -Chest x-ray  Aspiration pneumonia presumed aspiration pneumonia given findings at intubation and continued fevers.  Patient currently on Unasyn. Planned to switch to ancef; however, will stay on ancef in setting of myositis.   Will need swallow study once extubated  If PICC line, blood cultures from 720 must be negative for 72 hours  ETOH  Out of the window for alcohol withdrawal.  Diabetes  A1c 11.7.  CBGs in the mid 200s. Currently on detemir 20 units nightly with sliding scale every 4 hours.   Anemia stable at  baseline, 10.5  Nutrition nutrition on board.   Currently on tube feeds vital AF 1.2 at 20 mL/h.  Goal is 75 mL/h.  Electrolytes within normal limits Daily BMP w/ Mg and Phos   Best practice:  Diet: TPN Pain/Anxiety/Delirium protocol (if indicated): Place/Maintain arterial line **AND** sodium chloride, fentaNYL, ibuprofen, LORazepam  VAP protocol (if indicated): 30 degrees HOB  DVT prophylaxis: LOVENOX GI prophylaxis: PTX Glucose control: detemir and ssI  Mobility: BR Code Status: Full  Family Communication: Sister daily  Disposition: ICU   Wilber Oliphant, M.D.  PGY-2  Family Medicine  01/15/2019 7:52 AM  ------------------------------------------------------------------------------------------------------------  Attending note: I have seen and examined the patient. History, labs and imaging reviewed.  Briefly this is a 63 year old with diabetes, EtOH abuse admitted with DKA, EtOH withdrawal and MSSA bacteremia, UTI. S/P ID of back abscess  Got off Neo this morning.  Continues to be agitated when placed on pressure support weans Respiratory cultures growing stenotrophomonas  Blood pressure 90/62, pulse 86, temperature 97.9 F (36.6 C), resp. rate (!) 26, height 5\' 9"  (1.753 m), weight 96 kg, SpO2 100 %. Gen:      No acute distress HEENT:  EOMI, sclera anicteric, ETT Neck:     No masses; no thyromegaly Lungs:    Clear to auscultation bilaterally; normal respiratory effort CV:         Regular rate and rhythm; no murmurs Abd:      + bowel sounds; soft, non-tender; no palpable masses, no distension Ext:    No edema; adequate peripheral perfusion Skin:      Warm and dry; no rash Neuro: alert and oriented x 3 Psych: normal mood and affect   Labs/Imaging personally reviewed, significant for Hemoglobin 9, hematocrit 27.3 Sputum cultures-stenotrophomonas  MRI 7/22-left arm cellulitis, possible olecranon bursitis.  Assessment/plan: Septic shock, MSSA bacteremia Off neo  Continue unasyn TEE today  A. fib with RVR, now in normal sinus rhythm Off amiodarone  Respiratory failure, stenotrophomonas PSV weans as tolerated Lasix for diuresis as he is very vol overloaded  Diabetes Continue Levemir, SSI  Cellulitis, possible bursitis of left arm Continue antibiotics.  Will ask ortho to eval  The patient is critically ill with multiple organ systems failure and requires high complexity decision making for assessment and support, frequent evaluation and titration of therapies, application of advanced monitoring technologies and extensive interpretation of multiple databases.  Critical care time - 35 mins. This represents my time independent of the NPs time taking care of the pt.  Marshell Garfinkel MD [facility] 01/15/2019, 8:25 AM

## 2019-01-15 NOTE — Plan of Care (Signed)
  Problem: Safety: Goal: Ability to remain free from injury will improve Outcome: Progressing   Problem: Metabolic: Goal: Ability to maintain appropriate glucose levels will improve Outcome: Progressing   Problem: Nutritional: Goal: Maintenance of adequate nutrition will improve Outcome: Not Progressing Note: Pt has been on and off tube feedings for three days due to procedures. Tube feedings will be restarted and pt has no planned procedures as of right now.

## 2019-01-15 NOTE — Progress Notes (Signed)
   Asked to perform TEE on [name] for MSSA bacteremia. He had a recent 2D echo on 01/09/2019 which showed normal LVEF 55-60% and some mitral and aortic valve thickening, but no obvious vegetation. Unfortunately, he was found to have a large abscess of the upper back/lower cervical spine area which was debrided by general surgery on 01/13/19. The abscess was large, measuring 11x5x6 cm extending to the superficial musculature and extension to the spinous processes without bony erosion. According to Dr. [name], the surgeon, the infection tracked into the paraspinal muscles. I'm concerned about the increased risk of perforation and cervical spine instability with passage of the TEE probe. I think the risk of procedural complication outweighs the benefits at this point. I would recommend clinical treatment of his bacteremia and consider longer course of antibiotics for presumptive endocarditis if the patient has high-clinical concern for this or recurrent bacteremia after finishing his initial course of antibiotics.  Pixie Casino, MD, [facility], [place] Director of the [facility]  [facility] Diplomate of the American Board of Clinical Lipidology Attending Cardiologist  Direct Dial: [phone]  Fax: [phone]  Website:  [email or web address]

## 2019-01-15 NOTE — Progress Notes (Signed)
Patient ID: Frederick Moody, male   DOB: [birth year], 63 y.o.   MRN: [id number]    2 Days Post-Op  Subjective: Patient still agitated on vent at times.   Objective: Vital signs in last 24 hours: Temp:  [97.9 F (36.6 C)-99.4 F (37.4 C)] 97.9 F (36.6 C) (07/23 0400) Pulse Rate:  [59-92] 91 (07/23 0800) Resp:  [8-26] 26 (07/23 0743) BP: (77-125)/(45-79) 105/59 (07/23 0800) SpO2:  [98 %-100 %] 100 % (07/23 0743) Arterial Line BP: (65-107)/(47-90) 65/61 (07/22 2100) FiO2 (%):  [30 %-40 %] 40 % (07/23 0743) Weight:  [96 kg] 96 kg (07/23 0500) Last BM Date: 01/13/19  Intake/Output from previous day: 07/22 0701 - 07/23 0700 In: 2555.2 [I.V.:1946.7; NG/GT:258.8; IV Piggyback:349.7] Out: 660 [Urine:660] Intake/Output this shift: No intake/output data recorded.  PE: Skin: posterior neck wound is overall fairly clean.  Some areas of fibrin, but overall good granulation tissue present.  No further purulent drainage noted.  Lab Results:  Recent Labs    01/14/19 0446 01/15/19 0509  WBC 11.9* 9.2  HGB 10.6* 9.0*  HCT 32.0* 27.3*  PLT 274 236   BMET Recent Labs    01/14/19 0446 01/15/19 0509  NA 138 140  K 4.2 3.7  CL 112* 110  CO2 19* 23  GLUCOSE 253* 177*  BUN 22 32*  CREATININE 0.81 0.91  CALCIUM 6.8* 7.1*   PT/INR Recent Labs    01/13/19 0434  LABPROT 17.9*  INR 1.5*   CMP     Component Value Date/Time   NA 140 01/15/2019 0509   K 3.7 01/15/2019 0509   CL 110 01/15/2019 0509   CO2 23 01/15/2019 0509   GLUCOSE 177 (H) 01/15/2019 0509   BUN 32 (H) 01/15/2019 0509   CREATININE 0.91 01/15/2019 0509   CALCIUM 7.1 (L) 01/15/2019 0509   PROT 5.3 (L) 01/15/2019 0509   ALBUMIN 1.1 (L) 01/15/2019 0509   AST 51 (H) 01/15/2019 0509   ALT 30 01/15/2019 0509   ALKPHOS 111 01/15/2019 0509   BILITOT 0.9 01/15/2019 0509   GFRNONAA >60 01/15/2019 0509   GFRAA >60 01/15/2019 0509   Lipase  No results found for: LIPASE     Studies/Results: Mr Humerus Left  Wo Contrast  Result Date: 01/14/2019 CLINICAL DATA:  Elbow pain.  Sepsis.  Cellulitis at the elbow. EXAM: MRI OF THE LEFT HUMERUS WITHOUT CONTRAST TECHNIQUE: Multiplanar, multisequence MR imaging of the left humerus was performed. No intravenous contrast was administered. The study is quite limited due to the patient's body habitus, agitation on the ventilator, and limited available positioning for scanning. COMPARISON:  Radiographs dated 01/11/2019 FINDINGS: Bones/Joint/Cartilage The humeral head and shaft appear normal. The distal humerus is not completely seen on this exam. There is no evidence of an glenohumeral joint effusion. No significant abnormality of the acromioclavicular joint. Muscles and Tendons and soft tissues There is extensive edema in the superficial aspect of the left deltoid muscle as well as edema along the superficial and deep aspects of muscles of the rotator cuff and around the pectoralis muscles. There is also edema surrounding involving the biceps muscle and triceps muscle. Is marked edema of the muscles of the left posterolateral chest wall and in the subcutaneous fat of the lateral aspect of the chest. There is no definable discrete abscess. IMPRESSION: 1. Extensive edema in the muscles of the shoulder girdle and left upper arm and left lateral chest wall as well as extensive edema in the adjacent subcutaneous fat. This is consistent  with severe myositis. No definable abscesses. 2. No evidence of osteomyelitis. The elbow joint is not included on this exam. The distal humerus is not included on this exam. 3. No evidence of glenohumeral joint or AC joint effusion. Electronically Signed   By: Francene BoyersJames  Maxwell M.D.   On: 01/14/2019 19:54   Mr Forearm Left Wo Contrast  Result Date: 01/14/2019 CLINICAL DATA:  Sepsis.  Cellulitis. EXAM: MRI OF THE LEFT FOREARM WITHOUT CONTRAST TECHNIQUE: Multiplanar, multisequence MR imaging of the left elbow was performed. No intravenous contrast was  administered. COMPARISON:  Radiographs dated 01/11/2019 FINDINGS: Bones/Joint/Cartilage The bones of the left elbow appear normal. There is a moderate elbow joint effusion, nonspecific. Ligaments Intact. Muscles and Tendons Extensive edema in the subcutaneous fat around the elbow. Extensive intramuscular edema of the muscles around the elbow without a defined abscess. This edema is throughout the left upper arm and extends throughout the visualized portion of the left forearm. Soft tissues There is a small amount of fluid in the olecranon bursa. Prominent subcutaneous edema in the lateral aspect of chest. IMPRESSION: 1. Extensive muscle and subcutaneous edema throughout the visualized portion of the left arm consistent with cellulitis and myositis. 2. No definable abscess. 3. Focal small fluid collection in the olecranon bursa. This could be sterile or infected. 4. Moderate elbow joint effusion, nonspecific. 5. Bones appear normal. Electronically Signed   By: Francene BoyersJames  Maxwell M.D.   On: 01/14/2019 20:09    Anti-infectives: Anti-infectives (From admission, onward)   Start     Dose/Rate Route Frequency Ordered Stop   01/12/19 1400  Ampicillin-Sulbactam (UNASYN) 3 g in sodium chloride 0.9 % 100 mL IVPB     3 g 200 mL/hr over 30 Minutes Intravenous Every 6 hours 01/12/19 1101     01/12/19 1000  vancomycin (VANCOCIN) 1,250 mg in sodium chloride 0.9 % 250 mL IVPB  Status:  Discontinued     1,250 mg 166.7 mL/hr over 90 Minutes Intravenous Every 12 hours 01/12/19 0115 01/12/19 1101   01/12/19 0115  vancomycin (VANCOCIN) 1,500 mg in sodium chloride 0.9 % 500 mL IVPB     1,500 mg 250 mL/hr over 120 Minutes Intravenous  Once 01/12/19 0100 01/12/19 0514   01/10/19 1230  meropenem (MERREM) 1 g in sodium chloride 0.9 % 100 mL IVPB  Status:  Discontinued     1 g 200 mL/hr over 30 Minutes Intravenous Every 8 hours 01/10/19 1146 01/12/19 1101   01/08/19 2200  ceFAZolin (ANCEF) IVPB 2g/100 mL premix  Status:   Discontinued     2 g 200 mL/hr over 30 Minutes Intravenous Every 8 hours 01/08/19 1030 01/10/19 1146   01/08/19 1800  vancomycin (VANCOCIN) 1,500 mg in sodium chloride 0.9 % 500 mL IVPB  Status:  Discontinued     1,500 mg 250 mL/hr over 120 Minutes Intravenous Every 24 hours 01/07/19 1830 01/08/19 1030   01/08/19 0630  cefTRIAXone (ROCEPHIN) 2 g in sodium chloride 0.9 % 100 mL IVPB  Status:  Discontinued     2 g 200 mL/hr over 30 Minutes Intravenous Daily 01/08/19 0536 01/08/19 1030   01/08/19 0600  ceFEPIme (MAXIPIME) 2 g in sodium chloride 0.9 % 100 mL IVPB  Status:  Discontinued     2 g 200 mL/hr over 30 Minutes Intravenous Every 12 hours 01/07/19 1830 01/07/19 2132   01/07/19 1745  vancomycin (VANCOCIN) 2,000 mg in sodium chloride 0.9 % 500 mL IVPB     2,000 mg 250 mL/hr over 120 Minutes  Intravenous  Once 01/07/19 1738 01/07/19 2013   01/07/19 1745  ceFEPIme (MAXIPIME) 2 g in sodium chloride 0.9 % 100 mL IVPB     2 g 200 mL/hr over 30 Minutes Intravenous  Once 01/07/19 1738 01/07/19 1904       Assessment/Plan DKA (diabetic ketoacidoses) (HCC) Alcohol withdrawal (HCC) Aspiration pneumonia (HCC) Sepsis (HCC) Acute encephalopathy Acute respiratory failure (HCC)  Sepsis likely 2/2  POD 2, s/p I&D of Posterior neck/upper back abscess -NS WD dressing changes to wound BID -cont abx therapy -no further plans for debridement at this time -Cx showing staph aureus, abx per ID  FEN - TFs VTE - SCDs ID - unasyn   LOS: 8 days    Letha CapeKelly E Lianette Broussard , [facility] 01/15/2019, 9:24 AM Pager: [phone]

## 2019-01-15 NOTE — Consult Note (Signed)
Reason for Consult:LUE swelling Referring Physician: P Mannam  Frederick SizerRobert Moody is an 63 y.o. male.  HPI: Frederick MaduroRobert was admitted on 7/15 with sepsis. He is an uncontrolled diabetic. The source was thought to be an abscess on his upper back/neck. This underwent I&D by GS. He was noted to have LUE swelling on 7/18 and an US showed SQ edema but no abscess. On 7/20 he was noted to have decreased mental status and was intubated. The arm has remained swollen and gotten more so over the last few days.  Past Medical History:  Diagnosis Date  . Diabetes mellitus without complication (HCC)   . ETOH abuse     Past Surgical History:  Procedure Laterality Date  . APPENDECTOMY    . IRRIGATION AND DEBRIDEMENT ABSCESS N/A 01/13/2019   Procedure: IRRIGATION AND DEBRIDEMENT OF BACK ABSCESS;  Surgeon: Manus Ruddsuei, Matthew, MD;  Location: [facility] OR;  Service: General;  Laterality: N/A;    History reviewed. No pertinent family history.  Social History:  reports that he has never smoked. He has never used smokeless tobacco. He reports previous alcohol use. He reports that he does not use drugs.  Allergies: No Known Allergies  Medications: I have reviewed the patient's current medications.  Results for orders placed or performed during the hospital encounter of 01/07/19 (from the past 48 hour(s))  Glucose, capillary     Status: Abnormal   Collection Time: 01/13/19 11:24 AM  Result Value Ref Range   Glucose-Capillary 156 (H) 70 - 99 mg/dL  Aerobic/Anaerobic Culture (surgical/deep wound)     Status: Abnormal (Preliminary result)   Collection Time: 01/13/19 12:37 PM   Specimen: Abscess  Result Value Ref Range   Specimen Description ABSCESS UPPER BACK    Special Requests NONE    Gram Stain NO WBC SEEN FEW GRAM POSITIVE COCCI IN PAIRS     Culture (A)     STAPHYLOCOCCUS AUREUS SUSCEPTIBILITIES TO FOLLOW Performed at [facility], [address] [place], [place] [address]    Report Status PENDING    Glucose, capillary     Status: Abnormal   Collection Time: 01/13/19  3:33 PM  Result Value Ref Range   Glucose-Capillary 167 (H) 70 - 99 mg/dL  Glucose, capillary     Status: Abnormal   Collection Time: 01/13/19  7:59 PM  Result Value Ref Range   Glucose-Capillary 209 (H) 70 - 99 mg/dL  Glucose, capillary     Status: Abnormal   Collection Time: 01/13/19 11:40 PM  Result Value Ref Range   Glucose-Capillary 246 (H) 70 - 99 mg/dL  Glucose, capillary     Status: Abnormal   Collection Time: 01/14/19  3:36 AM  Result Value Ref Range   Glucose-Capillary 243 (H) 70 - 99 mg/dL  Magnesium     Status: None   Collection Time: 01/14/19  4:46 AM  Result Value Ref Range   Magnesium 2.0 1.7 - 2.4 mg/dL    Comment: Performed at [facility], [address] [place], [place] [address]  Phosphorus     Status: None   Collection Time: 01/14/19  4:46 AM  Result Value Ref Range   Phosphorus 3.3 2.5 - 4.6 mg/dL    Comment: Performed at [facility], [address] [place], [place] [address]  Comprehensive metabolic panel     Status: Abnormal   Collection Time: 01/14/19  4:46 AM  Result Value Ref Range   Sodium 138 135 - 145 mmol/L   Potassium  4.2 3.5 - 5.1 mmol/L   Chloride 112 (H) 98 - 111 mmol/L   CO2 19 (L) 22 - 32 mmol/L   Glucose, Bld 253 (H) 70 - 99 mg/dL   BUN 22 8 - 23 mg/dL   Creatinine, Ser 1.610.81 0.61 - 1.24 mg/dL   Calcium 6.8 (L) 8.9 - 10.3 mg/dL   Total Protein 5.6 (L) 6.5 - 8.1 g/dL   Albumin 1.1 (L) 3.5 - 5.0 g/dL   AST 42 (H) 15 - 41 U/L   ALT 34 0 - 44 U/L   Alkaline Phosphatase 100 38 - 126 U/L   Total Bilirubin 1.4 (H) 0.3 - 1.2 mg/dL   GFR calc non Af Amer >60 >60 mL/min   GFR calc Af Amer >60 >60 mL/min   Anion gap 7 5 - 15    Comment: Performed at [facility], [address] [place], [place] [address]  CBC with Differential/Platelet     Status: Abnormal   Collection Time: 01/14/19  4:46 AM  Result Value Ref Range   WBC 11.9 (H) 4.0 - 10.5 K/uL    RBC 3.29 (L) 4.22 - 5.81 MIL/uL   Hemoglobin 10.6 (L) 13.0 - 17.0 g/dL   HCT 54.032.0 (L) 98.139.0 - 19.152.0 %   MCV 97.3 80.0 - 100.0 fL   MCH 32.2 26.0 - 34.0 pg   MCHC 33.1 30.0 - 36.0 g/dL   RDW 47.813.1 29.511.5 - 62.115.5 %   Platelets 274 150 - 400 K/uL   nRBC 0.0 0.0 - 0.2 %   Neutrophils Relative % 90 %   Neutro Abs 10.8 (H) 1.7 - 7.7 K/uL   Lymphocytes Relative 7 %   Lymphs Abs 0.8 0.7 - 4.0 K/uL   Monocytes Relative 2 %   Monocytes Absolute 0.2 0.1 - 1.0 K/uL   Eosinophils Relative 0 %   Eosinophils Absolute 0.0 0.0 - 0.5 K/uL   Basophils Relative 0 %   Basophils Absolute 0.0 0.0 - 0.1 K/uL   WBC Morphology TOXIC GRANULATION    Immature Granulocytes 1 %   Abs Immature Granulocytes 0.12 (H) 0.00 - 0.07 K/uL    Comment: Performed at [facility], [address] [place], [place] [address]  Glucose, capillary     Status: Abnormal   Collection Time: 01/14/19  7:55 AM  Result Value Ref Range   Glucose-Capillary 244 (H) 70 - 99 mg/dL  Glucose, capillary     Status: Abnormal   Collection Time: 01/14/19 11:41 AM  Result Value Ref Range   Glucose-Capillary 204 (H) 70 - 99 mg/dL  Glucose, capillary     Status: Abnormal   Collection Time: 01/14/19  7:47 PM  Result Value Ref Range   Glucose-Capillary 262 (H) 70 - 99 mg/dL  Glucose, capillary     Status: Abnormal   Collection Time: 01/14/19 11:35 PM  Result Value Ref Range   Glucose-Capillary 212 (H) 70 - 99 mg/dL  Glucose, capillary     Status: Abnormal   Collection Time: 01/15/19  3:52 AM  Result Value Ref Range   Glucose-Capillary 188 (H) 70 - 99 mg/dL  Magnesium     Status: None   Collection Time: 01/15/19  5:09 AM  Result Value Ref Range   Magnesium 2.1 1.7 - 2.4 mg/dL    Comment: Performed at [facility], [address] [place], [place] [address]  Phosphorus     Status: None   Collection Time: 01/15/19  5:09 AM  Result Value Ref Range   Phosphorus 3.0 2.5 - 4.6 mg/dL    Comment: Performed at [facility] [address], [place], [place] [address]  Comprehensive metabolic panel     Status: Abnormal   Collection Time: 01/15/19  5:09 AM  Result Value Ref Range   Sodium 140 135 - 145 mmol/L   Potassium 3.7 3.5 - 5.1 mmol/L   Chloride 110 98 - 111 mmol/L   CO2 23 22 - 32 mmol/L   Glucose, Bld 177 (H) 70 - 99 mg/dL   BUN 32 (H) 8 - 23 mg/dL   Creatinine, Ser 0.91 0.61 - 1.24 mg/dL   Calcium 7.1 (L) 8.9 - 10.3 mg/dL   Total Protein 5.3 (L) 6.5 - 8.1 g/dL   Albumin 1.1 (L) 3.5 - 5.0 g/dL   AST 51 (H) 15 - 41 U/L   ALT 30 0 - 44 U/L   Alkaline Phosphatase 111 38 - 126 U/L   Total Bilirubin 0.9 0.3 - 1.2 mg/dL   GFR calc non Af Amer >60 >60 mL/min   GFR calc Af Amer >60 >60 mL/min   Anion gap 7 5 - 15    Comment: Performed at [facility] [address] [place], [place] [address]  CBC with Differential/Platelet     Status: Abnormal   Collection Time: 01/15/19  5:09 AM  Result Value Ref Range   WBC 9.2 4.0 - 10.5 K/uL   RBC 2.76 (L) 4.22 - 5.81 MIL/uL   Hemoglobin 9.0 (L) 13.0 - 17.0 g/dL   HCT 27.3 (L) 39.0 - 52.0 %   MCV 98.9 80.0 - 100.0 fL   MCH 32.6 26.0 - 34.0 pg   MCHC 33.0 30.0 - 36.0 g/dL   RDW 13.4 11.5 - 15.5 %   Platelets 236 150 - 400 K/uL   nRBC 0.0 0.0 - 0.2 %   Neutrophils Relative % 83 %   Neutro Abs 7.7 1.7 - 7.7 K/uL   Lymphocytes Relative 13 %   Lymphs Abs 1.2 0.7 - 4.0 K/uL   Monocytes Relative 3 %   Monocytes Absolute 0.3 0.1 - 1.0 K/uL   Eosinophils Relative 0 %   Eosinophils Absolute 0.0 0.0 - 0.5 K/uL   Basophils Relative 0 %   Basophils Absolute 0.0 0.0 - 0.1 K/uL   Immature Granulocytes 1 %   Abs Immature Granulocytes 0.07 0.00 - 0.07 K/uL    Comment: Performed at [facility], [address] [place], [place] [address]  Glucose, capillary     Status: Abnormal   Collection Time: 01/15/19  7:56 AM  Result Value Ref Range   Glucose-Capillary 125 (H) 70 - 99 mg/dL    Mr Humerus Left Wo Contrast  Result Date: 01/14/2019 CLINICAL DATA:   Elbow pain.  Sepsis.  Cellulitis at the elbow. EXAM: MRI OF THE LEFT HUMERUS WITHOUT CONTRAST TECHNIQUE: Multiplanar, multisequence MR imaging of the left humerus was performed. No intravenous contrast was administered. The study is quite limited due to the patient's body habitus, agitation on the ventilator, and limited available positioning for scanning. COMPARISON:  Radiographs dated 01/11/2019 FINDINGS: Bones/Joint/Cartilage The humeral head and shaft appear normal. The distal humerus is not completely seen on this exam. There is no evidence of an glenohumeral joint effusion. No significant abnormality of the acromioclavicular joint. Muscles and Tendons and soft tissues There is extensive edema in the superficial aspect of the left deltoid muscle as well as edema along  the superficial and deep aspects of muscles of the rotator cuff and around the pectoralis muscles. There is also edema surrounding involving the biceps muscle and triceps muscle. Is marked edema of the muscles of the left posterolateral chest wall and in the subcutaneous fat of the lateral aspect of the chest. There is no definable discrete abscess. IMPRESSION: 1. Extensive edema in the muscles of the shoulder girdle and left upper arm and left lateral chest wall as well as extensive edema in the adjacent subcutaneous fat. This is consistent with severe myositis. No definable abscesses. 2. No evidence of osteomyelitis. The elbow joint is not included on this exam. The distal humerus is not included on this exam. 3. No evidence of glenohumeral joint or AC joint effusion. Electronically Signed   By: Francene BoyersJames  Maxwell M.D.   On: 01/14/2019 19:54   Mr Forearm Left Wo Contrast  Result Date: 01/14/2019 CLINICAL DATA:  Sepsis.  Cellulitis. EXAM: MRI OF THE LEFT FOREARM WITHOUT CONTRAST TECHNIQUE: Multiplanar, multisequence MR imaging of the left elbow was performed. No intravenous contrast was administered. COMPARISON:  Radiographs dated 01/11/2019  FINDINGS: Bones/Joint/Cartilage The bones of the left elbow appear normal. There is a moderate elbow joint effusion, nonspecific. Ligaments Intact. Muscles and Tendons Extensive edema in the subcutaneous fat around the elbow. Extensive intramuscular edema of the muscles around the elbow without a defined abscess. This edema is throughout the left upper arm and extends throughout the visualized portion of the left forearm. Soft tissues There is a small amount of fluid in the olecranon bursa. Prominent subcutaneous edema in the lateral aspect of chest. IMPRESSION: 1. Extensive muscle and subcutaneous edema throughout the visualized portion of the left arm consistent with cellulitis and myositis. 2. No definable abscess. 3. Focal small fluid collection in the olecranon bursa. This could be sterile or infected. 4. Moderate elbow joint effusion, nonspecific. 5. Bones appear normal. Electronically Signed   By: Francene BoyersJames  Maxwell M.D.   On: 01/14/2019 20:09    Review of Systems  Unable to perform ROS: Intubated   Blood pressure 112/66, pulse 77, temperature 97.9 F (36.6 C), resp. rate 19, height 5\' 9"  (1.753 m), weight 96 kg, SpO2 100 %. Physical Exam  Constitutional: He appears well-developed and well-nourished. No distress.  HENT:  Head: Normocephalic and atraumatic.  Eyes: Conjunctivae are normal. Right eye exhibits no discharge. Left eye exhibits no discharge. No scleral icterus.  Neck: Normal range of motion.  Cardiovascular: Normal rate and regular rhythm.  Respiratory: Effort normal. No respiratory distress.  Musculoskeletal:     Comments: Left shoulder, elbow, wrist, digits- no skin wounds, pitting edema noted entirety of arm, winces with deep palpation, no instability, no blocks to motion  Sens  Ax/R/M/U could not assess  Mot   Ax/ R/ PIN/ M/ AIN/ U could not assess  Rad 2+  Neurological: He is alert.  Skin: Skin is warm and dry. He is not diaphoretic.  Psychiatric: He has a normal mood and  affect. His behavior is normal.    Assessment/Plan: LUE myositis -- Unfortunately no surgical indication here. Would stress aggressive hydration and arm elevation. Continue broad spectrum abx. No s/sx of compartment syndrome at this time. If he develops some focal symptoms may consider reimaging as this may develop into abscesses. Multiple medical problems including ARF, DM, sepsis, and EtOH abuse -- per primary team    Freeman CaldronMichael J. Jerritt Cardoza, PA-C Orthopedic Surgery [phone] 01/15/2019, 11:13 AM

## 2019-01-16 ENCOUNTER — Inpatient Hospital Stay (HOSPITAL_COMMUNITY): Payer: Medicaid Other

## 2019-01-16 LAB — COMPREHENSIVE METABOLIC PANEL
ALT: 41 U/L (ref 0–44)
AST: 83 U/L — ABNORMAL HIGH (ref 15–41)
Albumin: 1.3 g/dL — ABNORMAL LOW (ref 3.5–5.0)
Alkaline Phosphatase: 120 U/L (ref 38–126)
Anion gap: 6 (ref 5–15)
BUN: 32 mg/dL — ABNORMAL HIGH (ref 8–23)
CO2: 23 mmol/L (ref 22–32)
Calcium: 7.3 mg/dL — ABNORMAL LOW (ref 8.9–10.3)
Chloride: 112 mmol/L — ABNORMAL HIGH (ref 98–111)
Creatinine, Ser: 0.82 mg/dL (ref 0.61–1.24)
GFR calc Af Amer: >60 mL/min (ref >60)
GFR calc non Af Amer: >60 mL/min (ref >60)
Glucose, Bld: 167 mg/dL — ABNORMAL HIGH (ref 70–99)
Potassium: 6.5 mmol/L (ref 3.5–5.1)
Sodium: 141 mmol/L (ref 135–145)
Total Bilirubin: 2.3 mg/dL — ABNORMAL HIGH (ref 0.3–1.2)
Total Protein: 5.5 g/dL — ABNORMAL LOW (ref 6.5–8.1)

## 2019-01-16 LAB — BASIC METABOLIC PANEL
Anion gap: 8 (ref 5–15)
BUN: 26 mg/dL — ABNORMAL HIGH (ref 8–23)
CO2: 27 mmol/L (ref 22–32)
Calcium: 8 mg/dL — ABNORMAL LOW (ref 8.9–10.3)
Chloride: 111 mmol/L (ref 98–111)
Creatinine, Ser: 0.86 mg/dL (ref 0.61–1.24)
GFR calc Af Amer: >60 mL/min (ref >60)
GFR calc non Af Amer: >60 mL/min (ref >60)
Glucose, Bld: 184 mg/dL — ABNORMAL HIGH (ref 70–99)
Potassium: 3 mmol/L — ABNORMAL LOW (ref 3.5–5.1)
Sodium: 146 mmol/L — ABNORMAL HIGH (ref 135–145)

## 2019-01-16 LAB — GLUCOSE, CAPILLARY
Glucose-Capillary: 103 mg/dL — ABNORMAL HIGH (ref 70–99)
Glucose-Capillary: 113 mg/dL — ABNORMAL HIGH (ref 70–99)
Glucose-Capillary: 117 mg/dL — ABNORMAL HIGH (ref 70–99)
Glucose-Capillary: 142 mg/dL — ABNORMAL HIGH (ref 70–99)
Glucose-Capillary: 143 mg/dL — ABNORMAL HIGH (ref 70–99)
Glucose-Capillary: 171 mg/dL — ABNORMAL HIGH (ref 70–99)
Glucose-Capillary: 176 mg/dL — ABNORMAL HIGH (ref 70–99)

## 2019-01-16 LAB — CBC WITH DIFFERENTIAL/PLATELET
Abs Immature Granulocytes: 0.1 10*3/uL — ABNORMAL HIGH (ref 0.00–0.07)
Basophils Absolute: 0.2 10*3/uL — ABNORMAL HIGH (ref 0.0–0.1)
Basophils Relative: 2 %
Eosinophils Absolute: 0.2 10*3/uL (ref 0.0–0.5)
Eosinophils Relative: 2 %
HCT: 28.7 % — ABNORMAL LOW (ref 39.0–52.0)
Hemoglobin: 9.6 g/dL — ABNORMAL LOW (ref 13.0–17.0)
Lymphocytes Relative: 4 %
Lymphs Abs: 0.3 10*3/uL — ABNORMAL LOW (ref 0.7–4.0)
MCH: 32.9 pg (ref 26.0–34.0)
MCHC: 33.4 g/dL (ref 30.0–36.0)
MCV: 98.3 fL (ref 80.0–100.0)
Monocytes Absolute: 0.1 10*3/uL (ref 0.1–1.0)
Monocytes Relative: 1 %
Myelocytes: 1 %
Neutro Abs: 7.3 10*3/uL (ref 1.7–7.7)
Neutrophils Relative %: 90 %
Platelets: 220 10*3/uL (ref 150–400)
RBC: 2.92 MIL/uL — ABNORMAL LOW (ref 4.22–5.81)
RDW: 13.2 % (ref 11.5–15.5)
WBC: 8.1 10*3/uL (ref 4.0–10.5)
nRBC: 0 % (ref 0.0–0.2)
nRBC: 0 /100 WBC

## 2019-01-16 LAB — MAGNESIUM: Magnesium: 1.8 mg/dL (ref 1.7–2.4)

## 2019-01-16 LAB — PHOSPHORUS: Phosphorus: 3.8 mg/dL (ref 2.5–4.6)

## 2019-01-16 MED ORDER — POTASSIUM CHLORIDE 10 MEQ/50ML IV SOLN
10.0000 meq | INTRAVENOUS | Status: AC
  Administered 2019-01-16 (×6): 10 meq via INTRAVENOUS
  Filled 2019-01-16 (×6): qty 50

## 2019-01-16 MED ORDER — POLYVINYL ALCOHOL 1.4 % OP SOLN
1.0000 [drp] | OPHTHALMIC | Status: DC | PRN
  Filled 2019-01-16: qty 15

## 2019-01-16 MED ORDER — ENOXAPARIN SODIUM 40 MG/0.4ML ~~LOC~~ SOLN
40.0000 mg | SUBCUTANEOUS | Status: DC
  Administered 2019-01-16 – 2019-01-27 (×12): 40 mg via SUBCUTANEOUS
  Filled 2019-01-16 (×12): qty 0.4

## 2019-01-16 MED ORDER — FUROSEMIDE 10 MG/ML IJ SOLN
80.0000 mg | Freq: Four times a day (QID) | INTRAMUSCULAR | Status: DC
  Administered 2019-01-16 – 2019-01-17 (×4): 80 mg via INTRAVENOUS
  Filled 2019-01-16 (×5): qty 8

## 2019-01-16 MED ORDER — LIP MEDEX EX OINT
TOPICAL_OINTMENT | CUTANEOUS | Status: DC | PRN
  Administered 2019-01-16: 21:00:00 via TOPICAL
  Filled 2019-01-16: qty 7

## 2019-01-16 NOTE — Progress Notes (Signed)
CRITICAL VALUE ALERT  Critical Value:  3.0  Date & Time Notied:  01/16/2019 1752  Provider Notified: Dr. Tamala Julian, Critical Care  Orders Received/Actions taken: Physician orders 60 mEq to be administered over 6 hours

## 2019-01-16 NOTE — Progress Notes (Signed)
Patient ID: Frederick Moody, male   DOB: June 20, 1956, 63 y.o.   MRN: [id number]    3 Days Post-Op  Subjective: Still on vent.  Weaning some.  Objective: Vital signs in last 24 hours: Temp:  [98.3 F (36.8 C)-99.8 F (37.7 C)] 99.5 F (37.5 C) (07/24 0725) Pulse Rate:  [59-100] 70 (07/24 0737) Resp:  [14-31] 23 (07/24 0737) BP: (90-148)/(58-96) 137/75 (07/24 0600) SpO2:  [99 %-100 %] 100 % (07/24 0737) FiO2 (%):  [40 %] 40 % (07/24 0737) Weight:  [96.4 kg] 96.4 kg (07/24 0442) Last BM Date: 01/15/19  Intake/Output from previous day: 07/23 0701 - 07/24 0700 In: 3679.5 [I.V.:2329.4; NG/GT:900; IV Piggyback:450.1] Out: 2023 [Urine:1823; Stool:200] Intake/Output this shift: No intake/output data recorded.  PE: Skin: posterior neck wound is cleaning up well and almost all granulation tissue.  No evidence of further infection or purulent drainage.   Lab Results:  Recent Labs    01/15/19 0509 01/16/19 0457  WBC 9.2 8.1  HGB 9.0* 9.6*  HCT 27.3* 28.7*  PLT 236 220   BMET Recent Labs    01/14/19 0446 01/15/19 0509  NA 138 140  K 4.2 3.7  CL 112* 110  CO2 19* 23  GLUCOSE 253* 177*  BUN 22 32*  CREATININE 0.81 0.91  CALCIUM 6.8* 7.1*   PT/INR No results for input(s): LABPROT, INR in the last 72 hours. CMP     Component Value Date/Time   NA 140 01/15/2019 0509   K 3.7 01/15/2019 0509   CL 110 01/15/2019 0509   CO2 23 01/15/2019 0509   GLUCOSE 177 (H) 01/15/2019 0509   BUN 32 (H) 01/15/2019 0509   CREATININE 0.91 01/15/2019 0509   CALCIUM 7.1 (L) 01/15/2019 0509   PROT 5.3 (L) 01/15/2019 0509   ALBUMIN 1.1 (L) 01/15/2019 0509   AST 51 (H) 01/15/2019 0509   ALT 30 01/15/2019 0509   ALKPHOS 111 01/15/2019 0509   BILITOT 0.9 01/15/2019 0509   GFRNONAA >60 01/15/2019 0509   GFRAA >60 01/15/2019 0509   Lipase  No results found for: LIPASE     Studies/Results: Dg Abd 1 View  Result Date: 01/15/2019 CLINICAL DATA:  Orogastric tube placement EXAM:  ABDOMEN - 1 VIEW COMPARISON:  01/12/2019 FINDINGS: Enteric tube is present with the distal tip and side hole within the proximal stomach. There is gaseous distention of the stomach and visualized transverse colon. No gross free intraperitoneal air. Probable small right pleural effusion. IMPRESSION: As above. Electronically Signed   By: Duanne GuessNicholas  Plundo M.D.   On: 01/15/2019 17:05   Mr Humerus Left Wo Contrast  Result Date: 01/14/2019 CLINICAL DATA:  Elbow pain.  Sepsis.  Cellulitis at the elbow. EXAM: MRI OF THE LEFT HUMERUS WITHOUT CONTRAST TECHNIQUE: Multiplanar, multisequence MR imaging of the left humerus was performed. No intravenous contrast was administered. The study is quite limited due to the patient's body habitus, agitation on the ventilator, and limited available positioning for scanning. COMPARISON:  Radiographs dated 01/11/2019 FINDINGS: Bones/Joint/Cartilage The humeral head and shaft appear normal. The distal humerus is not completely seen on this exam. There is no evidence of an glenohumeral joint effusion. No significant abnormality of the acromioclavicular joint. Muscles and Tendons and soft tissues There is extensive edema in the superficial aspect of the left deltoid muscle as well as edema along the superficial and deep aspects of muscles of the rotator cuff and around the pectoralis muscles. There is also edema surrounding involving the biceps muscle and triceps muscle.  Is marked edema of the muscles of the left posterolateral chest wall and in the subcutaneous fat of the lateral aspect of the chest. There is no definable discrete abscess. IMPRESSION: 1. Extensive edema in the muscles of the shoulder girdle and left upper arm and left lateral chest wall as well as extensive edema in the adjacent subcutaneous fat. This is consistent with severe myositis. No definable abscesses. 2. No evidence of osteomyelitis. The elbow joint is not included on this exam. The distal humerus is not included  on this exam. 3. No evidence of glenohumeral joint or AC joint effusion. Electronically Signed   By: Lorriane Shire M.D.   On: 01/14/2019 19:54   Mr Forearm Left Wo Contrast  Result Date: 01/14/2019 CLINICAL DATA:  Sepsis.  Cellulitis. EXAM: MRI OF THE LEFT FOREARM WITHOUT CONTRAST TECHNIQUE: Multiplanar, multisequence MR imaging of the left elbow was performed. No intravenous contrast was administered. COMPARISON:  Radiographs dated 01/11/2019 FINDINGS: Bones/Joint/Cartilage The bones of the left elbow appear normal. There is a moderate elbow joint effusion, nonspecific. Ligaments Intact. Muscles and Tendons Extensive edema in the subcutaneous fat around the elbow. Extensive intramuscular edema of the muscles around the elbow without a defined abscess. This edema is throughout the left upper arm and extends throughout the visualized portion of the left forearm. Soft tissues There is a small amount of fluid in the olecranon bursa. Prominent subcutaneous edema in the lateral aspect of chest. IMPRESSION: 1. Extensive muscle and subcutaneous edema throughout the visualized portion of the left arm consistent with cellulitis and myositis. 2. No definable abscess. 3. Focal small fluid collection in the olecranon bursa. This could be sterile or infected. 4. Moderate elbow joint effusion, nonspecific. 5. Bones appear normal. Electronically Signed   By: Lorriane Shire M.D.   On: 01/14/2019 20:09    Anti-infectives: Anti-infectives (From admission, onward)   Start     Dose/Rate Route Frequency Ordered Stop   01/12/19 1400  Ampicillin-Sulbactam (UNASYN) 3 g in sodium chloride 0.9 % 100 mL IVPB     3 g 200 mL/hr over 30 Minutes Intravenous Every 6 hours 01/12/19 1101     01/12/19 1000  vancomycin (VANCOCIN) 1,250 mg in sodium chloride 0.9 % 250 mL IVPB  Status:  Discontinued     1,250 mg 166.7 mL/hr over 90 Minutes Intravenous Every 12 hours 01/12/19 0115 01/12/19 1101   01/12/19 0115  vancomycin (VANCOCIN)  1,500 mg in sodium chloride 0.9 % 500 mL IVPB     1,500 mg 250 mL/hr over 120 Minutes Intravenous  Once 01/12/19 0100 01/12/19 0514   01/10/19 1230  meropenem (MERREM) 1 g in sodium chloride 0.9 % 100 mL IVPB  Status:  Discontinued     1 g 200 mL/hr over 30 Minutes Intravenous Every 8 hours 01/10/19 1146 01/12/19 1101   01/08/19 2200  ceFAZolin (ANCEF) IVPB 2g/100 mL premix  Status:  Discontinued     2 g 200 mL/hr over 30 Minutes Intravenous Every 8 hours 01/08/19 1030 01/10/19 1146   01/08/19 1800  vancomycin (VANCOCIN) 1,500 mg in sodium chloride 0.9 % 500 mL IVPB  Status:  Discontinued     1,500 mg 250 mL/hr over 120 Minutes Intravenous Every 24 hours 01/07/19 1830 01/08/19 1030   01/08/19 0630  cefTRIAXone (ROCEPHIN) 2 g in sodium chloride 0.9 % 100 mL IVPB  Status:  Discontinued     2 g 200 mL/hr over 30 Minutes Intravenous Daily 01/08/19 0536 01/08/19 1030   01/08/19 0600  ceFEPIme (MAXIPIME) 2 g in sodium chloride 0.9 % 100 mL IVPB  Status:  Discontinued     2 g 200 mL/hr over 30 Minutes Intravenous Every 12 hours 01/07/19 1830 01/07/19 2132   01/07/19 1745  vancomycin (VANCOCIN) 2,000 mg in sodium chloride 0.9 % 500 mL IVPB     2,000 mg 250 mL/hr over 120 Minutes Intravenous  Once 01/07/19 1738 01/07/19 2013   01/07/19 1745  ceFEPIme (MAXIPIME) 2 g in sodium chloride 0.9 % 100 mL IVPB     2 g 200 mL/hr over 30 Minutes Intravenous  Once 01/07/19 1738 01/07/19 1904       Assessment/Plan DKA (diabetic ketoacidoses) (HCC) Alcohol withdrawal (HCC) Aspiration pneumonia (HCC) Sepsis (HCC) Acute encephalopathy Acute respiratory failure (HCC)  Sepsis likely 2/2 POD 3, s/p I&D ofPosterior neck/upper back abscess -NS WD dressing changes to wound BID -cont abx therapy -no further plans for debridement at this time -Cx showing staph aureus, abx per ID -will be prn over WE, call if needed during that time  FEN -TFs VTE -SCDs ID -[email or web address]   LOS: 9 days     Letha CapeKelly E Dove [place] , [facility] 01/16/2019, 8:59 AM Pager: [phone]

## 2019-01-16 NOTE — Progress Notes (Addendum)
Came in to patients room to administer 10 am medications. Noted ET tube was at 18 cm and notable sounds coming from airway. Patient still maintaining 02 saturations and pulling appropriate tidal volumes. Notified RRT who put ET back to 25 at the lip. Per Dr. Vaughan Browner, ET tube was removed at 0955 to 5 liters [place]. Patient is alert and o2 sats are 100% at this time. TF off and PO meds held

## 2019-01-16 NOTE — Procedures (Signed)
Extubation Procedure Note  Patient Details:   Name: Frederick Moody DOB: 1956-03-27 MRN: [id number]   Airway Documentation:    Vent end date: 01/16/19 Vent end time: 1009   Evaluation  O2 sats: stable throughout Complications: No apparent complications Patient did tolerate procedure well. Bilateral Breath Sounds: Rhonchi  Patient able to speak. Yes  Rudene Re 01/16/2019, 10:10 AM

## 2019-01-16 NOTE — Progress Notes (Addendum)
NAME:  Frederick Moody, MRN:  [id number], DOB:  26-Jan-1956, LOS: 9 ADMISSION DATE:  01/07/2019, CONSULTATION DATE:  01/11/19 CHIEF COMPLAINT:  Encephaolpathy   Brief History   This is a 63 yo with history of uncontrolled DM and etoh abuse who presents for weakness and falls. Found to have etoh withdrawal and DKA (resolved) and MSSA bacteremia likely source is abscess on upper back. Patient was admitted to CCM after being unable to protect his airway, thought to be due to excess benzodiazepenes with his withdrawal.   Past Medical History  Poorly controlled DM, ETOH abuse  Principal Problem:   MSSA bacteremia Active Problems:   DKA (diabetic ketoacidoses) (HCC)   Alcohol withdrawal (HCC)   Aspiration pneumonia (HCC)   Sepsis (HCC)   Acute encephalopathy   Acute respiratory failure (HCC)   Malnutrition of moderate degree   Significant Hospital Events   01/07/19 - admitted for MSSA positive  01/11/19 - intubated and transferred to MICU 01/12/19 - OR for I & D of upper back   Consults:  ID  Surgery  Nutrition  Cardiology   Procedures:  01/11/2019-intubation 01/11/19 - Central Line 7/20 > OR for abscess drain  7/20 : I/O, foley placement   Significant Diagnostic Tests:  7/20 MRI Elbow >> severe myositis, small fluid collection, subcutaneous swelling  7/20 Ab XR Gastric tube courses into the stomach with its tip in the antropyloric region, appropriately positioned.  Ct Head Wo Contrast 7/20 CT Head w/o - no acute findings, remote infarcts, chronic axillary sinusitis 7/20 CT chest W/O-moderate layering pleural effusions with multisegmental atelectasis.  Dorsal cervical thoracic soft tissue inflammation 7/20/CT spine 11 x 5 x 6 cm abscess in the dorsal cervicothoracic soft tissues extending from the skin surface into the superficial musculature. There is extension to spinous processes without bony erosion.  7/17 TTE -normal systolic function with EF 55 to 60%.  Left atrium mildly  dilated.  Mild mitral annular calcification present.  No stenosis of aortic valve but with mild thickening and calcification.  7/17 UE U/S Focal area of soft tissue edema with surrounding Doppler flow in the medial aspect of left elbow which appears to be centered in the flexor musculature concerning for myositis. If there is further clinical concern regarding septic arthritis, recommend an MRI of the left elbow. .   7/22 left upper extremity MRI -forearm: Extensive muscle and subcutaneous edema visualized throughout the left arm consistent with cellulitis and myositis.  Humerus: Inflammation spreads to shoulder girdle dural and left upper arm and left lateral chest consistent with severe myositis. Micro Data:  C. difficile PCR negative MRSA negative 01/07/19 - blood culture >> + MSSA (Resistant to cipro, clinda, erythro) 01/07/19 - Urine cx >> + staph aureus pan senstive (Resistant to cipro)  01/09/19 - Blood culture >> NG 01/10/19 - Urine Culture >> NG 01/12/19 - Blood culture >> NG  01/12/19 - Abscess culture >> staph aureus resistant to Cipro 01/12/19 -trach aspirate: Stenotrophomonas final  Antimicrobials:  7/20 Unasyn >>  7/16-17, 7/20 Vanc 7/16> CTX 7/16 > Ancef 7/15-7/16> Cefepime  Interim history/subjective:   Overnight, patient had copious amounts of stool. Weaning on PSV  Objective   Temp:  [98.3 F (36.8 C)-99.8 F (37.7 C)] 99.5 F (37.5 C) (07/24 0725) Pulse Rate:  [59-100] 70 (07/24 0737) Resp:  [14-31] 23 (07/24 0737) BP: (90-148)/(58-96) 137/75 (07/24 0600) SpO2:  [99 %-100 %] 100 % (07/24 0737) FiO2 (%):  [40 %] 40 % (07/24 0737) Weight:  [  96.4 kg] 96.4 kg (07/24 0442)  CVP:  [7 mmHg-13 mmHg] 13 mmHg  Vent Mode: PSV;CPAP FiO2 (%):  [40 %] 40 % Set Rate:  [15 bmp] 15 bmp Vt Set:  [560 mL] 560 mL PEEP:  [5 cmH20] 5 cmH20 Pressure Support:  [5 cmH20] 5 cmH20 Plateau Pressure:  [12 cmH20-21 cmH20] 12 cmH20  Intake/Output      07/23 0701 - 07/24 0700 07/24 0701 -  07/25 0700   I.V. (mL/kg) 2329.4 (24.2)    NG/GT 900    IV Piggyback 450.1    Total Intake(mL/kg) 3679.5 (38.2)    Urine (mL/kg/hr) 1823 (0.8)    Stool 200    Total Output 2023    Net +1656.5         Stool Occurrence 4 x       Filed Weights   01/14/19 0500 01/15/19 0500 01/16/19 0442  Weight: 96.7 kg 96 kg 96.4 kg   Examination: General: Lying in bed. Moving extremities. Lungs: Clear to auscultation bilaterally. Extubated, [place] Cardiovascular: Regular rate and rhythm.  2+ BLEE Abdomen: Soft, positive bowel sounds, nontender nondistended Extremities: Warm.  Inflatable boots bilaterally. Neuro:  When asked for name, replies with his birthdate. Otherwise, does not seem to understand other questions.  GU: Foley cath in place  Assessment & Plan:  MSSA Bacteremia likely due to soft tissue abscess on upper back, s/p drainage in the OR. TEE will not be obtained at this time per cardiology. Per ID, patient will need long course of ancef as patient is not getting TEE. White count is stable. No fevers. Continue on unasyn for steno on trach aspirate x 7 days followed by ancef.  -Continue Unasyn, (7/20, day 5 of 7)  -Blood culture if fever   Acute hypoxic respiratory failure Extubated and stable on nasal canula. Will continue to wean precedex. Patient remains confused. Will watch respiratory status in ICU and likely d/c to floor this weekend. Continue with lasix 80mg  q 8 hours for volume status and pulmonary edema.  - lasix  - keeps sats > 92% on [place]  Aspiration pneumonia presumed aspiration pneumonia given findings at intubation and continued fevers.  No consolidation appreciated on chest x-ray previously patient currently on Unasyn for steno. - swallow study pending   Confusion  Likely ICU delirium and medications.  - continue to re orient patient  - dc foley cath as soon as possible  - will need PT/OT when more active   Severe Myositis & cellulitis of LUE  Orthopedic surgery  consulted yesterday and recommend supportive measures with no drainable fluid at this time.  They have signed off.  CK was obtained and 62 - monitor   ETOH  Out of the window for alcohol withdrawal.  Diabetes  A1c 11.7.  CBGs in the mid 200s. Currently on detemir 20 units nightly with sliding scale every 4 hours.   Anemia stable at baseline, 10.5 Lovenox added back today   Nutrition nutrition on board.   D/c'ed tube feeds. Hold PO medications until swallow study  - swallow study   Electrolytes within normal limits. Lab draws for K 3x this morning. Two were hemolyzed, anothyer 3.5. EKG wnl. Will recheck at 1700 today.  Daily BMP w/ Mg and Phos   Best practice:  Diet: NPO Pain/Anxiety/Delirium protocol (if indicated): Place/Maintain arterial line **AND** sodium chloride, fentaNYL, ibuprofen, LORazepam  VAP protocol (if indicated): No DVT prophylaxis: LOVENOX GI prophylaxis: PTX Glucose control: detemir and ssI  Mobility: BR Code Status:  Full  Family Communication: Sister daily  Disposition: ICU   Melene Planachel E. Kim, M.D.  PGY-2  Family Medicine  01/16/2019 8:46 AM   -------------------------------------------------------------  Attending note: I have seen and examined the patient. History, labs and imaging reviewed.  Briefly this is a 63 year old with diabetes, EtOH abuse admitted with DKA, EtOH withdrawal and MSSA bacteremia, UTI. S/P ID of back abscess Off pressors.  Weaning on pressure support 5/5  Blood pressure 137/75, pulse 70, temperature 99.5 F (37.5 C), temperature source Axillary, resp. rate (!) 23, height 5\' 9"  (1.753 m), weight 96.4 kg, SpO2 100 %. Gen:      No acute distress HEENT:  EOMI, sclera anicteric Neck:     No masses; no thyromegaly, ET tube Lungs:    Clear to auscultation bilaterally; normal respiratory effort CV:         Regular rate and rhythm; no murmurs Abd:      + bowel sounds; soft, non-tender; no palpable masses, no distension Ext:    No  edema; adequate peripheral perfusion Skin:      Warm and dry; no rash Neuro: Awake, responds to some commands  Labs/Imaging personally reviewed, significant for BUN/creatinine 32/0.91 Hemoglobin 9.6, platelets 220 No new imaging  Assessment/plan: Septic shock, MSSA bacteremia Off neo Continue unasyn TEE today  A. fib with RVR,now innormal sinus rhythm Offamiodarone  Respiratory failure, stenotrophomonas PSV weans as tolerated. Likely extubation today Lasix for diuresis as he is very vol overloaded  Diabetes Continue Levemir, SSI  Hyperkalemia Likely hemolyzed. Repeat labs  The patient is critically ill with multiple organ systems failure and requires high complexity decision making for assessment and support, frequent evaluation and titration of therapies, application of advanced monitoring technologies and extensive interpretation of multiple databases.  Critical care time - 35 mins. This represents my time independent of the NPs time taking care of the pt.  Chilton GreathousePraveen Angelia Hazell MD [facility] 01/16/2019, 8:15 AM

## 2019-01-16 NOTE — Progress Notes (Signed)
eLink Physician-Brief Progress Note Patient Name: Frederick Moody DOB: 1955-12-10 MRN: [id number]   Date of Service  01/16/2019  HPI/Events of Note  Pt with copious diarrhea and needs Flexiseal fecal management  System to protect area of back surgery  From contamination  eICU Interventions  Flexiseal ordered.        Orissa Arreaga U Oliviana Mcgahee 01/16/2019, 4:20 AM

## 2019-01-16 NOTE — Progress Notes (Signed)
[facility]   Reason for visit: Follow up on bacteremia  Interval History: now s/p extubation, no fever > 48 hours, normal WBC.  No associated diarrhea.    Physical Exam: Constitutional:  Vitals:   01/16/19 1500 01/16/19 1600  BP: 112/71 (!) 150/77  Pulse: 84 75  Resp: 20 (!) 31  Temp:  98.8 F (37.1 C)  SpO2: 97% 98%   patient appears in NAD, confused Eyes: anicteric Respiratory: Normal respiratory effort; CTA B Cardiovascular: RRR GI: soft, nt, nd  Review of Systems: Unable to be assessed due to mental status  Lab Results  Component Value Date   WBC 8.1 01/16/2019   HGB 9.6 (L) 01/16/2019   HCT 28.7 (L) 01/16/2019   MCV 98.3 01/16/2019   PLT 220 01/16/2019    Lab Results  Component Value Date   CREATININE 0.82 01/16/2019   BUN 32 (H) 01/16/2019   NA 141 01/16/2019   K 6.5 (HH) 01/16/2019   CL 112 (H) 01/16/2019   CO2 23 01/16/2019    Lab Results  Component Value Date   ALT 41 01/16/2019   AST 83 (H) 01/16/2019   ALKPHOS 120 01/16/2019     Microbiology: Recent Results (from the past 240 hour(s))  Culture, blood (Routine x 2)     Status: Abnormal   Collection Time: 01/07/19 11:05 AM   Specimen: BLOOD  Result Value Ref Range Status   Specimen Description BLOOD BLOOD RIGHT FOREARM  Final   Special Requests   Final    BOTTLES DRAWN AEROBIC AND ANAEROBIC Blood Culture adequate volume   Culture  Setup Time   Final    GRAM POSITIVE COCCI IN CLUSTERS IN BOTH AEROBIC AND ANAEROBIC BOTTLES CRITICAL RESULT CALLED TO, READ BACK BY AND VERIFIED WITH: Antoine Primas PharmD 10:15 01/08/19 ([email or web address]) Performed at [facility], [address] [place], [place] [address]    Culture STAPHYLOCOCCUS AUREUS (A)  Final   Report Status 01/10/2019 FINAL  Final   Organism ID, Bacteria STAPHYLOCOCCUS AUREUS  Final      Susceptibility   Staphylococcus aureus - MIC*    CIPROFLOXACIN >=8 RESISTANT Resistant     ERYTHROMYCIN >=8 RESISTANT  Resistant     GENTAMICIN <=0.5 SENSITIVE Sensitive     OXACILLIN 0.5 SENSITIVE Sensitive     TETRACYCLINE <=1 SENSITIVE Sensitive     VANCOMYCIN 1 SENSITIVE Sensitive     TRIMETH/SULFA <=10 SENSITIVE Sensitive     CLINDAMYCIN >=8 RESISTANT Resistant     RIFAMPIN <=0.5 SENSITIVE Sensitive     Inducible Clindamycin NEGATIVE Sensitive     * STAPHYLOCOCCUS AUREUS  Blood Culture ID Panel (Reflexed)     Status: Abnormal   Collection Time: 01/07/19 11:05 AM  Result Value Ref Range Status   Enterococcus species NOT DETECTED NOT DETECTED Final   Listeria monocytogenes NOT DETECTED NOT DETECTED Final   Staphylococcus species DETECTED (A) NOT DETECTED Final    Comment: CRITICAL RESULT CALLED TO, READ BACK BY AND VERIFIED WITH: Antoine Primas PharmD 10:15 01/08/19 ([email or web address])    Staphylococcus aureus (BCID) DETECTED (A) NOT DETECTED Final    Comment: Methicillin (oxacillin) susceptible Staphylococcus aureus (MSSA). Preferred therapy is anti staphylococcal beta lactam antibiotic (Cefazolin or Nafcillin), unless clinically contraindicated. CRITICAL RESULT CALLED TO, READ BACK BY AND VERIFIED WITH: Antoine Primas PharmD 10:15 01/08/19 ([email or web address])    Methicillin resistance NOT DETECTED NOT DETECTED Final   Streptococcus species NOT DETECTED NOT DETECTED Final   Streptococcus agalactiae NOT  DETECTED NOT DETECTED Final   Streptococcus pneumoniae NOT DETECTED NOT DETECTED Final   Streptococcus pyogenes NOT DETECTED NOT DETECTED Final   Acinetobacter baumannii NOT DETECTED NOT DETECTED Final   Enterobacteriaceae species NOT DETECTED NOT DETECTED Final   Enterobacter cloacae complex NOT DETECTED NOT DETECTED Final   Escherichia coli NOT DETECTED NOT DETECTED Final   Klebsiella oxytoca NOT DETECTED NOT DETECTED Final   Klebsiella pneumoniae NOT DETECTED NOT DETECTED Final   Proteus species NOT DETECTED NOT DETECTED Final   Serratia marcescens NOT DETECTED NOT DETECTED Final   Haemophilus influenzae NOT DETECTED NOT  DETECTED Final   Neisseria meningitidis NOT DETECTED NOT DETECTED Final   Pseudomonas aeruginosa NOT DETECTED NOT DETECTED Final   Candida albicans NOT DETECTED NOT DETECTED Final   Candida glabrata NOT DETECTED NOT DETECTED Final   Candida krusei NOT DETECTED NOT DETECTED Final   Candida parapsilosis NOT DETECTED NOT DETECTED Final   Candida tropicalis NOT DETECTED NOT DETECTED Final    Comment: Performed at [facility], [address] [place], [place] [address]  Culture, blood (Routine x 2)     Status: Abnormal   Collection Time: 01/07/19  5:25 PM   Specimen: BLOOD LEFT ARM  Result Value Ref Range Status   Specimen Description BLOOD LEFT ARM  Final   Special Requests   Final    BOTTLES DRAWN AEROBIC AND ANAEROBIC Blood Culture results may not be optimal due to an inadequate volume of blood received in culture bottles   Culture  Setup Time   Final    GRAM POSITIVE COCCI AEROBIC BOTTLE ONLY CRITICAL RESULT CALLED TO, READ BACK BY AND VERIFIED WITH: PHARMD E MARTIN [phone] AT 1107 AM BY CM    Culture (A)  Final    STAPHYLOCOCCUS AUREUS SUSCEPTIBILITIES PERFORMED ON PREVIOUS CULTURE WITHIN THE LAST 5 DAYS. Performed at [facility], [address] [place], [place] [address]    Report Status 01/10/2019 FINAL  Final  SARS Coronavirus 2 (CEPHEID - Performed in [facility] hospital lab), Hosp Order     Status: None   Collection Time: 01/07/19  5:35 PM   Specimen: Nasopharyngeal Swab  Result Value Ref Range Status   SARS Coronavirus 2 NEGATIVE NEGATIVE Final    Comment: (NOTE) If result is NEGATIVE SARS-CoV-2 target nucleic acids are NOT DETECTED. The SARS-CoV-2 RNA is generally detectable in upper and lower  respiratory specimens during the acute phase of infection. The lowest  concentration of SARS-CoV-2 viral copies this assay can detect is 250  copies / mL. A negative result does not preclude SARS-CoV-2 infection  and should not be used as the sole basis for  treatment or other  patient management decisions.  A negative result may occur with  improper specimen collection / handling, submission of specimen other  than nasopharyngeal swab, presence of viral mutation(s) within the  areas targeted by this assay, and inadequate number of viral copies  (<250 copies / mL). A negative result must be combined with clinical  observations, patient history, and epidemiological information. If result is POSITIVE SARS-CoV-2 target nucleic acids are DETECTED. The SARS-CoV-2 RNA is generally detectable in upper and lower  respiratory specimens dur ing the acute phase of infection.  Positive  results are indicative of active infection with SARS-CoV-2.  Clinical  correlation with patient history and other diagnostic information is  necessary to determine patient infection status.  Positive results do  not rule out bacterial infection or co-infection with other  viruses. If result is PRESUMPTIVE POSTIVE SARS-CoV-2 nucleic acids MAY BE PRESENT.   A presumptive positive result was obtained on the submitted specimen  and confirmed on repeat testing.  While 2019 novel coronavirus  (SARS-CoV-2) nucleic acids may be present in the submitted sample  additional confirmatory testing may be necessary for epidemiological  and / or clinical management purposes  to differentiate between  SARS-CoV-2 and other Sarbecovirus currently known to infect humans.  If clinically indicated additional testing with an alternate test  methodology [phone]([id number]) is advised. The SARS-CoV-2 RNA is generally  detectable in upper and lower respiratory sp ecimens during the acute  phase of infection. The expected result is Negative. Fact Sheet for Patients:  BoilerBrush.com.cyhttps://www.fda.gov/media/136312/download Fact Sheet for Healthcare Providers: https://pope.com/https://www.fda.gov/media/136313/download This test is not yet approved or cleared by the [place] FDA and has been authorized for detection and/or  diagnosis of SARS-CoV-2 by FDA under an Emergency Use Authorization (EUA).  This EUA will remain in effect (meaning this test can be used) for the duration of the COVID-19 declaration under Section 564(b)(1) of the Act, 21 U.S.C. section 360bbb-3(b)(1), unless the authorization is terminated or revoked sooner. Performed at [facility], [address] [place], [place] [address]   Urine culture     Status: Abnormal   Collection Time: 01/07/19  6:22 PM   Specimen: In/Out Cath Urine  Result Value Ref Range Status   Specimen Description IN/OUT CATH URINE  Final   Special Requests NONE  Final   Culture (A)  Final    40,000 COLONIES/mL STAPHYLOCOCCUS AUREUS 20,000 COLONIES/mL STAPHYLOCOCCUS SPECIES (COAGULASE NEGATIVE) 1,000 COLONIES/mL VIRIDANS STREPTOCOCCUS Standardized susceptibility testing for this organism is not available. Performed at [facility], [address] [place], [place] [address]    Report Status 01/10/2019 FINAL  Final   Organism ID, Bacteria STAPHYLOCOCCUS AUREUS (A)  Final   Organism ID, Bacteria STAPHYLOCOCCUS SPECIES (COAGULASE NEGATIVE) (A)  Final      Susceptibility   Staphylococcus aureus - MIC*    CIPROFLOXACIN >=8 RESISTANT Resistant     GENTAMICIN <=0.5 SENSITIVE Sensitive     NITROFURANTOIN <=16 SENSITIVE Sensitive     OXACILLIN 0.5 SENSITIVE Sensitive     TETRACYCLINE <=1 SENSITIVE Sensitive     VANCOMYCIN 1 SENSITIVE Sensitive     TRIMETH/SULFA <=10 SENSITIVE Sensitive     CLINDAMYCIN <=0.25 SENSITIVE Sensitive     RIFAMPIN <=0.5 SENSITIVE Sensitive     Inducible Clindamycin NEGATIVE Sensitive     * 40,000 COLONIES/mL STAPHYLOCOCCUS AUREUS   Staphylococcus species (coagulase negative) - MIC*    CIPROFLOXACIN <=0.5 SENSITIVE Sensitive     GENTAMICIN <=0.5 SENSITIVE Sensitive     NITROFURANTOIN 32 SENSITIVE Sensitive     OXACILLIN <=0.25 SENSITIVE Sensitive     TETRACYCLINE <=1 SENSITIVE Sensitive     VANCOMYCIN <=0.5 SENSITIVE  Sensitive     TRIMETH/SULFA <=10 SENSITIVE Sensitive     CLINDAMYCIN <=0.25 SENSITIVE Sensitive     RIFAMPIN <=0.5 SENSITIVE Sensitive     Inducible Clindamycin NEGATIVE Sensitive     * 20,000 COLONIES/mL STAPHYLOCOCCUS SPECIES (COAGULASE NEGATIVE)  Culture, blood (routine x 2)     Status: None   Collection Time: 01/09/19  8:25 AM   Specimen: BLOOD LEFT HAND  Result Value Ref Range Status   Specimen Description BLOOD LEFT HAND  Final   Special Requests   Final    BOTTLES DRAWN AEROBIC ONLY Blood Culture adequate volume   Culture   Final  NO GROWTH 5 DAYS Performed at [facility], [place] [address], [place], [place] [address]    Report Status 01/14/2019 FINAL  Final  Culture, blood (routine x 2)     Status: None   Collection Time: 01/09/19  8:26 AM   Specimen: BLOOD LEFT HAND  Result Value Ref Range Status   Specimen Description BLOOD LEFT HAND  Final   Special Requests   Final    BOTTLES DRAWN AEROBIC ONLY Blood Culture adequate volume   Culture   Final    NO GROWTH 5 DAYS Performed at [facility], [place] [address] [place], [place] [address]    Report Status 01/14/2019 FINAL  Final  Culture, Urine     Status: None   Collection Time: 01/10/19 11:24 AM   Specimen: Urine, Random  Result Value Ref Range Status   Specimen Description URINE, RANDOM  Final   Special Requests NONE  Final   Culture   Final    NO GROWTH Performed at [facility], [place] [address], [place], [place] [address]    Report Status 01/11/2019 FINAL  Final  C difficile quick scan w PCR reflex     Status: None   Collection Time: 01/11/19  3:44 PM   Specimen: STOOL  Result Value Ref Range Status   C Diff antigen NEGATIVE NEGATIVE Final   C Diff toxin NEGATIVE NEGATIVE Final   C Diff interpretation No C. difficile detected.  Final    Comment: Performed at [facility], [place] [address] [place], [place] [address]  MRSA PCR Screening     Status: None   Collection Time: 01/11/19 11:42 PM    Specimen: Nasal Mucosa; Nasopharyngeal  Result Value Ref Range Status   MRSA by PCR NEGATIVE NEGATIVE Final    Comment:        The GeneXpert MRSA Assay (FDA approved for NASAL specimens only), is one component of a comprehensive MRSA colonization surveillance program. It is not intended to diagnose MRSA infection nor to guide or monitor treatment for MRSA infections. Performed at [facility], [place] [address], [place], [place] [address]   Culture, blood (routine x 2)     Status: None (Preliminary result)   Collection Time: 01/12/19  1:43 AM   Specimen: BLOOD LEFT HAND  Result Value Ref Range Status   Specimen Description BLOOD LEFT HAND  Final   Special Requests   Final    BOTTLES DRAWN AEROBIC ONLY Blood Culture results may not be optimal due to an inadequate volume of blood received in culture bottles   Culture   Final    NO GROWTH 4 DAYS Performed at [facility], [place] [address], [place], [place] [address]    Report Status PENDING  Incomplete  Culture, blood (routine x 2)     Status: None (Preliminary result)   Collection Time: 01/12/19  1:50 AM   Specimen: BLOOD RIGHT HAND  Result Value Ref Range Status   Specimen Description BLOOD RIGHT HAND  Final   Special Requests   Final    BOTTLES DRAWN AEROBIC ONLY Blood Culture adequate volume   Culture   Final    NO GROWTH 4 DAYS Performed at [facility], [place] [address], [place],  [address]    Report Status PENDING  Incomplete  Culture, respiratory (non-expectorated)     Status: None   Collection Time: 01/12/19 12:47 PM   Specimen: Tracheal Aspirate; Respiratory  Result  Value Ref Range Status   Specimen Description TRACHEAL ASPIRATE  Final   Special Requests NONE  Final   Gram Stain   Final    RARE WBC PRESENT, PREDOMINANTLY PMN NO ORGANISMS SEEN Performed at [facility], [address] [place], [place] [address]    Culture FEW STENOTROPHOMONAS MALTOPHILIA  Final   Report Status  01/15/2019 FINAL  Final   Organism ID, Bacteria STENOTROPHOMONAS MALTOPHILIA  Final      Susceptibility   Stenotrophomonas maltophilia - MIC*    LEVOFLOXACIN 2 SENSITIVE Sensitive     TRIMETH/SULFA 80 RESISTANT Resistant     * FEW STENOTROPHOMONAS MALTOPHILIA  Surgical PCR screen     Status: Abnormal   Collection Time: 01/13/19  9:00 AM   Specimen: Nasal Mucosa; Nasal Swab  Result Value Ref Range Status   MRSA, PCR NEGATIVE NEGATIVE Final   Staphylococcus aureus POSITIVE (A) NEGATIVE Final    Comment: (NOTE) The Xpert SA Assay (FDA approved for NASAL specimens in patients 64 years of age and older), is one component of a comprehensive surveillance program. It is not intended to diagnose infection nor to guide or monitor treatment. Performed at [facility], [address] [place], [place] [address]   Aerobic/Anaerobic Culture (surgical/deep wound)     Status: Abnormal (Preliminary result)   Collection Time: 01/13/19 12:37 PM   Specimen: Abscess  Result Value Ref Range Status   Specimen Description ABSCESS UPPER BACK  Final   Special Requests NONE  Final   Gram Stain   Final    NO WBC SEEN FEW GRAM POSITIVE COCCI IN PAIRS Performed at [facility], [address] [place], [place] [address]    Culture (A)  Final    STAPHYLOCOCCUS AUREUS NO ANAEROBES ISOLATED; CULTURE IN PROGRESS FOR 5 DAYS    Report Status PENDING  Incomplete   Organism ID, Bacteria STAPHYLOCOCCUS AUREUS  Final      Susceptibility   Staphylococcus aureus - MIC*    CIPROFLOXACIN >=8 RESISTANT Resistant     ERYTHROMYCIN <=0.25 SENSITIVE Sensitive     GENTAMICIN <=0.5 SENSITIVE Sensitive     OXACILLIN 0.5 SENSITIVE Sensitive     TETRACYCLINE <=1 SENSITIVE Sensitive     VANCOMYCIN <=0.5 SENSITIVE Sensitive     TRIMETH/SULFA <=10 SENSITIVE Sensitive     CLINDAMYCIN <=0.25 SENSITIVE Sensitive     RIFAMPIN <=0.5 SENSITIVE Sensitive     Inducible Clindamycin NEGATIVE Sensitive     *  STAPHYLOCOCCUS AUREUS    Impression/Plan:  1. Bacteremia - MSSA with abscess in back, s/p debridement.  On Unasyn and will need a prolonged course of Ancef for 6 weeks total.  TEE unable to be done safely.   2. Abscess - debrided and stable.  On treatment as above.  3. Respiratory failure - now extubated and stable.

## 2019-01-16 NOTE — Evaluation (Signed)
Clinical/Bedside Swallow Evaluation Patient Details  Name: Frederick Moody MRN: [id number] Date of Birth: [birth year]  Today's Date: 01/16/2019 Time: SLP Start Time (ACUTE ONLY): 1529 SLP Stop Time (ACUTE ONLY): 1541 SLP Time Calculation (min) (ACUTE ONLY): 12 min  Past Medical History:  Past Medical History:  Diagnosis Date  . Diabetes mellitus without complication ([place])   . ETOH abuse    Past Surgical History:  Past Surgical History:  Procedure Laterality Date  . APPENDECTOMY    . IRRIGATION AND DEBRIDEMENT ABSCESS N/A 01/13/2019   Procedure: IRRIGATION AND DEBRIDEMENT OF BACK ABSCESS;  Surgeon: Donnie Mesa, MD;  Location: [place];  Service: General;  Laterality: N/A;   HPI:  Pt is a 63 yo male with hx of uncontrolled DM and EtOH abuse who presents with weakness and falls. Pt admitted with DKA, sepsis with MSSA bacteremia and abscess on back (s/p I&D 7/21), AMS with EtOH withdrawal and possible benzo OD and aspiration requiring intubation 7/19-7/24.    Assessment / Plan / Recommendation Clinical Impression  Pt is cognitively not ready to start a PO diet, suspect a combination of sedating medications and underlying delirium. When he does vocalize his voice is only mildly hoarse. He has poor awareness, not making any attempts to manipulate ice chips, which are therefore removed from his mouth. He did take a single straw sip of water when fully alert and attentive, and although no overt signs of aspiration were observed, he was not able to take any additional boluses. Per RN, plan is to work on weaning precedex today. Suspect that his potential to resume a PO diet is good with additional time to wean from this and to allow for improved mentation. Will continue to follow for readiness. SLP Visit Diagnosis: Dysphagia, unspecified (R13.10)    Aspiration Risk  Severe aspiration risk    Diet Recommendation NPO   Medication Administration: Via alternative means    Other  Recommendations  Oral Care Recommendations: Oral care QID Other Recommendations: Have oral suction available   Follow up Recommendations (tba)      Frequency and Duration min 2x/week  2 weeks       Prognosis Prognosis for Safe Diet Advancement: Good Barriers to Reach Goals: Cognitive deficits      Swallow Study   General HPI: Pt is a 63 yo male with hx of uncontrolled DM and EtOH abuse who presents with weakness and falls. Pt admitted with DKA, sepsis with MSSA bacteremia and abscess on back (s/p I&D 7/21), AMS with EtOH withdrawal and possible benzo OD and aspiration requiring intubation 7/19-7/24.  Type of Study: Bedside Swallow Evaluation Previous Swallow Assessment: none in chart Diet Prior to this Study: NPO Temperature Spikes Noted: No Respiratory Status: Nasal cannula History of Recent Intubation: Yes Length of Intubations (days): 5 days Date extubated: 01/16/19 Behavior/Cognition: Lethargic/Drowsy;Requires cueing Oral Cavity Assessment: Dry Oral Care Completed by SLP: Yes Oral Cavity - Dentition: Adequate natural dentition Self-Feeding Abilities: Total assist Patient Positioning: Upright in bed Baseline Vocal Quality: Hoarse(mild)    Oral/Motor/Sensory Function Overall Oral Motor/Sensory Function: (not following commands during assessment)   Ice Chips Ice chips: Impaired Presentation: Spoon Oral Phase Impairments: Poor awareness of bolus;Other (comment)(removed by SLP)   Thin Liquid Thin Liquid: Impaired Presentation: Spoon;Straw;Cup Oral Phase Impairments: Poor awareness of bolus    Nectar Thick Nectar Thick Liquid: Not tested   Honey Thick Honey Thick Liquid: Not tested   Puree Puree: Not tested   Solid     Solid: Not tested  Virl AxeLaura P Gemma Ruan 01/16/2019,3:54 PM  Ivar DrapeLaura Jasime Westergren, M.A. CCC-SLP Acute Herbalist[facility]abilitation Services Pager [phone] Office [phone]

## 2019-01-17 ENCOUNTER — Inpatient Hospital Stay (HOSPITAL_COMMUNITY): Payer: Medicaid Other

## 2019-01-17 DIAGNOSIS — J9601 Acute respiratory failure with hypoxia: Secondary | ICD-10-CM

## 2019-01-17 DIAGNOSIS — E876 Hypokalemia: Secondary | ICD-10-CM

## 2019-01-17 DIAGNOSIS — L8990 Pressure ulcer of unspecified site, unspecified stage: Secondary | ICD-10-CM | POA: Insufficient documentation

## 2019-01-17 DIAGNOSIS — E1110 Type 2 diabetes mellitus with ketoacidosis without coma: Secondary | ICD-10-CM

## 2019-01-17 LAB — BASIC METABOLIC PANEL
Anion gap: 12 (ref 5–15)
BUN: 21 mg/dL (ref 8–23)
CO2: 26 mmol/L (ref 22–32)
Calcium: 7.8 mg/dL — ABNORMAL LOW (ref 8.9–10.3)
Chloride: 110 mmol/L (ref 98–111)
Creatinine, Ser: 1 mg/dL (ref 0.61–1.24)
GFR calc Af Amer: >60 mL/min (ref >60)
GFR calc non Af Amer: >60 mL/min (ref >60)
Glucose, Bld: 138 mg/dL — ABNORMAL HIGH (ref 70–99)
Potassium: 3.4 mmol/L — ABNORMAL LOW (ref 3.5–5.1)
Sodium: 148 mmol/L — ABNORMAL HIGH (ref 135–145)

## 2019-01-17 LAB — COMPREHENSIVE METABOLIC PANEL
ALT: 31 U/L (ref 0–44)
AST: 50 U/L — ABNORMAL HIGH (ref 15–41)
Albumin: 1.5 g/dL — ABNORMAL LOW (ref 3.5–5.0)
Alkaline Phosphatase: 101 U/L (ref 38–126)
Anion gap: 8 (ref 5–15)
BUN: 23 mg/dL (ref 8–23)
CO2: 30 mmol/L (ref 22–32)
Calcium: 8.1 mg/dL — ABNORMAL LOW (ref 8.9–10.3)
Chloride: 109 mmol/L (ref 98–111)
Creatinine, Ser: 0.79 mg/dL (ref 0.61–1.24)
GFR calc Af Amer: >60 mL/min (ref >60)
GFR calc non Af Amer: >60 mL/min (ref >60)
Glucose, Bld: 51 mg/dL — ABNORMAL LOW (ref 70–99)
Potassium: 2.8 mmol/L — ABNORMAL LOW (ref 3.5–5.1)
Sodium: 147 mmol/L — ABNORMAL HIGH (ref 135–145)
Total Bilirubin: 0.9 mg/dL (ref 0.3–1.2)
Total Protein: 6 g/dL — ABNORMAL LOW (ref 6.5–8.1)

## 2019-01-17 LAB — CBC WITH DIFFERENTIAL/PLATELET
Abs Immature Granulocytes: 0.05 10*3/uL (ref 0.00–0.07)
Basophils Absolute: 0 10*3/uL (ref 0.0–0.1)
Basophils Relative: 0 %
Eosinophils Absolute: 0.1 10*3/uL (ref 0.0–0.5)
Eosinophils Relative: 1 %
HCT: 27.4 % — ABNORMAL LOW (ref 39.0–52.0)
Hemoglobin: 9 g/dL — ABNORMAL LOW (ref 13.0–17.0)
Immature Granulocytes: 1 %
Lymphocytes Relative: 13 %
Lymphs Abs: 0.9 10*3/uL (ref 0.7–4.0)
MCH: 32.4 pg (ref 26.0–34.0)
MCHC: 32.8 g/dL (ref 30.0–36.0)
MCV: 98.6 fL (ref 80.0–100.0)
Monocytes Absolute: 0.2 10*3/uL (ref 0.1–1.0)
Monocytes Relative: 3 %
Neutro Abs: 5.7 10*3/uL (ref 1.7–7.7)
Neutrophils Relative %: 82 %
Platelets: 217 10*3/uL (ref 150–400)
RBC: 2.78 MIL/uL — ABNORMAL LOW (ref 4.22–5.81)
RDW: 13 % (ref 11.5–15.5)
WBC: 7 10*3/uL (ref 4.0–10.5)
nRBC: 0 % (ref 0.0–0.2)

## 2019-01-17 LAB — GLUCOSE, CAPILLARY
Glucose-Capillary: 42 mg/dL — CL (ref 70–99)
Glucose-Capillary: 125 mg/dL — ABNORMAL HIGH (ref 70–99)
Glucose-Capillary: 61 mg/dL — ABNORMAL LOW (ref 70–99)
Glucose-Capillary: 85 mg/dL (ref 70–99)
Glucose-Capillary: 69 mg/dL — ABNORMAL LOW (ref 70–99)
Glucose-Capillary: 56 mg/dL — ABNORMAL LOW (ref 70–99)
Glucose-Capillary: 109 mg/dL — ABNORMAL HIGH (ref 70–99)
Glucose-Capillary: 105 mg/dL — ABNORMAL HIGH (ref 70–99)
Glucose-Capillary: 99 mg/dL (ref 70–99)
Glucose-Capillary: 133 mg/dL — ABNORMAL HIGH (ref 70–99)

## 2019-01-17 LAB — CULTURE, BLOOD (ROUTINE X 2)
Culture: NO GROWTH
Culture: NO GROWTH
Special Requests: ADEQUATE

## 2019-01-17 MED ORDER — CEFAZOLIN SODIUM-DEXTROSE 2-4 GM/100ML-% IV SOLN
2.0000 g | Freq: Three times a day (TID) | INTRAVENOUS | Status: DC
  Administered 2019-01-19 – 2019-02-04 (×51): 2 g via INTRAVENOUS
  Filled 2019-01-17 (×55): qty 100

## 2019-01-17 MED ORDER — DEXTROSE IN LACTATED RINGERS 5 % IV SOLN
INTRAVENOUS | Status: DC
  Administered 2019-01-17: 06:00:00 via INTRAVENOUS

## 2019-01-17 MED ORDER — POTASSIUM CHLORIDE 10 MEQ/50ML IV SOLN
10.0000 meq | INTRAVENOUS | Status: AC
  Administered 2019-01-17 (×8): 10 meq via INTRAVENOUS
  Filled 2019-01-17 (×8): qty 50

## 2019-01-17 MED ORDER — FREE WATER
200.0000 mL | Freq: Three times a day (TID) | Status: DC
  Administered 2019-01-17 – 2019-01-18 (×3): 200 mL

## 2019-01-17 MED ORDER — DEXTROSE 50 % IV SOLN
INTRAVENOUS | Status: AC
  Administered 2019-01-17: 50 mL
  Filled 2019-01-17: qty 50

## 2019-01-17 MED ORDER — MAGNESIUM SULFATE 2 GM/50ML IV SOLN
2.0000 g | Freq: Once | INTRAVENOUS | Status: AC
  Administered 2019-01-17: 2 g via INTRAVENOUS
  Filled 2019-01-17: qty 50

## 2019-01-17 MED ORDER — INSULIN ASPART 100 UNIT/ML ~~LOC~~ SOLN
0.0000 [IU] | SUBCUTANEOUS | Status: DC
  Administered 2019-01-17: 1 [IU] via SUBCUTANEOUS
  Administered 2019-01-18: 08:00:00 2 [IU] via SUBCUTANEOUS
  Administered 2019-01-18: 5 [IU] via SUBCUTANEOUS
  Administered 2019-01-18: 7 [IU] via SUBCUTANEOUS
  Administered 2019-01-18: 2 [IU] via SUBCUTANEOUS
  Administered 2019-01-18: 13:00:00 3 [IU] via SUBCUTANEOUS
  Administered 2019-01-18: 5 [IU] via SUBCUTANEOUS
  Administered 2019-01-19 (×3): 3 [IU] via SUBCUTANEOUS
  Administered 2019-01-19: 04:00:00 5 [IU] via SUBCUTANEOUS
  Administered 2019-01-19: 7 [IU] via SUBCUTANEOUS
  Administered 2019-01-20 (×2): 5 [IU] via SUBCUTANEOUS
  Administered 2019-01-20: 05:00:00 3 [IU] via SUBCUTANEOUS
  Administered 2019-01-20 (×2): 5 [IU] via SUBCUTANEOUS
  Administered 2019-01-20: 3 [IU] via SUBCUTANEOUS
  Administered 2019-01-20: 08:00:00 5 [IU] via SUBCUTANEOUS
  Administered 2019-01-21: 2 [IU] via SUBCUTANEOUS
  Administered 2019-01-21: 12:00:00 1 [IU] via SUBCUTANEOUS
  Administered 2019-01-21 – 2019-01-22 (×5): 2 [IU] via SUBCUTANEOUS
  Administered 2019-01-22: 05:00:00 1 [IU] via SUBCUTANEOUS
  Administered 2019-01-22 – 2019-01-23 (×6): 2 [IU] via SUBCUTANEOUS
  Administered 2019-01-24 – 2019-01-25 (×2): 1 [IU] via SUBCUTANEOUS

## 2019-01-17 MED ORDER — POTASSIUM CHLORIDE 10 MEQ/50ML IV SOLN
10.0000 meq | INTRAVENOUS | Status: AC
  Administered 2019-01-17 – 2019-01-18 (×3): 10 meq via INTRAVENOUS
  Filled 2019-01-17 (×3): qty 50

## 2019-01-17 NOTE — Progress Notes (Signed)
Updated pts sister [place].  Answered all questions.  Irven [place], RN

## 2019-01-17 NOTE — Progress Notes (Signed)
eLink Physician-Brief Progress Note Patient Name: Frederick Moody DOB: [birth year] MRN: [id number]   Date of Service  01/17/2019  HPI/Events of Note  Hypoglycemia  eICU Interventions  LR infusion changed to D 5 LR at 75 ml/ hr, Q 4 hourly blood glucose monitoring        Abisai Deer U Ravon Mortellaro 01/17/2019, 6:01 AM

## 2019-01-17 NOTE — Evaluation (Signed)
Physical Therapy Evaluation Patient Details Name: Frederick Moody MRN: [id number] DOB: 03-27-1956 Today's Date: 01/17/2019   History of Present Illness  Pt is a 63 yo male with hx of uncontrolled DM and EtOH abuse who presents with weakness and falls. Pt admitted with DKA, sepsis with MSSA bacteremia and abscess on back (s/p I&D 7/21), AMS with EtOH withdrawal and possible benzo OD and aspiration requiring intubation 7/19-7/24.  Clinical Impression  Pt admitted with/for falls/weakness with bacteremia, ETOH w/d.  Pt very restless/confused and delirious, needing mod to max assist for basic mobility..  Pt currently limited functionally due to the problems listed. ( See problems list.)   Pt will benefit from PT to maximize function and safety in order to get ready for next venue listed below.     Follow Up Recommendations CIR;Supervision/Assistance - 24 hour;Other (comment)(depending on progress.)    Equipment Recommendations  Other (comment)(TBA)    Recommendations for Other Services Rehab consult     Precautions / Restrictions Precautions Precautions: Fall Restrictions Weight Bearing Restrictions: No      Mobility  Bed Mobility Overal bed mobility: Needs Assistance Bed Mobility: Supine to Sit;Sit to Supine     Supine to sit: Mod assist Sit to supine: Mod assist   General bed mobility comments: cues for direction and assist of trunk and LE's  Transfers Overall transfer level: Needs assistance Equipment used: None Transfers: Sit to/from Stand;Lateral/Scoot Transfers Sit to Stand: Mod assist;Max assist        Lateral/Scoot Transfers: Max assist General transfer comment: cues for technique, sit to stand x6 at EOB.  Pt submaximally upright with mildly flexed posture and R knee not fully extended.  Pt stands up better than w/shift and pivoting laterally up toward [facility]  Ambulation/Gait             General Gait Details: unable today  Stairs             Wheelchair Mobility    Modified Rankin (Stroke Patients Only)       Balance Overall balance assessment: Needs assistance Sitting-balance support: Single extremity supported;No upper extremity supported Sitting balance-Leahy Scale: Fair Sitting balance - Comments: but not safe to leave alone at EOB due to poor safety awareness/delerium     Standing balance-Leahy Scale: Zero Standing balance comment: stood at EOB x6.  Needed mod to max assist each trial.  Pt with tendency to lean posteriorly moderately heavility                             Pertinent Vitals/Pain Pain Assessment: Faces Faces Pain Scale: No hurt Pain Intervention(s): Monitored during session    Home Living Family/patient expects to be discharged to:: Private residence Living Arrangements: Other relatives Available Help at Discharge: Family;Available 24 hours/day Type of Home: Apartment Home Access: Stairs to enter Entrance Stairs-Rails: Right;Left Entrance Stairs-Number of Steps: 2 Home Layout: One level Home Equipment: None Additional Comments: Not sure of pt reliability.  Pt with decrease focus and perseverating on need for water.    Prior Function Level of Independence: Independent               Hand Dominance        Extremity/Trunk Assessment   Upper Extremity Assessment Upper Extremity Assessment: Generalized weakness    Lower Extremity Assessment Lower Extremity Assessment: Generalized weakness       Communication   Communication: No difficulties  Cognition Arousal/Alertness: Awake/alert Behavior During Therapy: Restless;Flat affect  Overall Cognitive Status: Impaired/Different from baseline Area of Impairment: Attention;Following commands;Safety/judgement                   Current Attention Level: Focused   Following Commands: Follows one step commands with increased time;Follows one step commands inconsistently Safety/Judgement: Decreased awareness of  safety            General Comments General comments (skin integrity, edema, etc.): VSS, with HR climbing into the 110's    Exercises     Assessment/Plan    PT Assessment Patient needs continued PT services  PT Problem List Decreased strength;Decreased activity tolerance;Decreased balance;Decreased mobility;Decreased coordination;Decreased knowledge of use of DME;Cardiopulmonary status limiting activity       PT Treatment Interventions DME instruction;Gait training;Stair training;Functional mobility training;Therapeutic activities;Balance training;Patient/family education    PT Goals (Current goals can be found in the Care Plan section)  Acute Rehab PT Goals Patient Stated Goal: I NEED water... PT Goal Formulation: With patient Time For Goal Achievement: 01/31/19 Potential to Achieve Goals: Fair    Frequency Min 3X/week   Barriers to discharge        Co-evaluation               AM-PAC PT "6 Clicks" Mobility  Outcome Measure Help needed turning from your back to your side while in a flat bed without using bedrails?: A Lot Help needed moving from lying on your back to sitting on the side of a flat bed without using bedrails?: A Lot Help needed moving to and from a bed to a chair (including a wheelchair)?: A Lot Help needed standing up from a chair using your arms (e.g., wheelchair or bedside chair)?: A Lot Help needed to walk in hospital room?: Total Help needed climbing 3-5 steps with a railing? : Total 6 Click Score: 10    End of Session   Activity Tolerance: Patient tolerated treatment well;Patient limited by fatigue Patient left: in bed;with call bell/phone within reach;with restraints reapplied Nurse Communication: Mobility status PT Visit Diagnosis: Other abnormalities of gait and mobility (R26.89);Muscle weakness (generalized) (M62.81);Difficulty in walking, not elsewhere classified (R26.2)    Time: [id number] PT Time Calculation (min) (ACUTE ONLY): 23  min   Charges:   PT Evaluation $PT Eval Moderate Complexity: 1 Mod PT Treatments $Therapeutic Activity: 8-22 mins        01/17/2019  Ocheyedan BingKen Taha Dimond, PT Acute Rehabilitation Services [phone]  (pager) [phone]  (office)  Eliseo GumKenneth V Ah Bott 01/17/2019, 2:03 PM

## 2019-01-17 NOTE — Progress Notes (Signed)
[facility]   Reason for visit: Follow up on bacteremia  Interval History: remains afebrile, normal WBC.  No associated diarrhea. No complaints   Physical Exam: Constitutional:  Vitals:   01/17/19 0800 01/17/19 0900  BP: (!) 146/79 136/69  Pulse: 71 84  Resp: (!) 26 (!) 21  Temp: 97.8 F (36.6 C)   SpO2: 100% 100%   patient appears in NAD, more alert and interactive Eyes: anicteric Respiratory: Normal respiratory effort; CTA B Cardiovascular: RRR GI: soft, nt, nd  Review of Systems: Unable to be assessed due to mental status  Lab Results  Component Value Date   WBC 7.0 01/17/2019   HGB 9.0 (L) 01/17/2019   HCT 27.4 (L) 01/17/2019   MCV 98.6 01/17/2019   PLT 217 01/17/2019    Lab Results  Component Value Date   CREATININE 0.79 01/17/2019   BUN 23 01/17/2019   NA 147 (H) 01/17/2019   K 2.8 (L) 01/17/2019   CL 109 01/17/2019   CO2 30 01/17/2019    Lab Results  Component Value Date   ALT 31 01/17/2019   AST 50 (H) 01/17/2019   ALKPHOS 101 01/17/2019     Microbiology: Recent Results (from the past 240 hour(s))  Culture, blood (Routine x 2)     Status: Abnormal   Collection Time: 01/07/19  5:25 PM   Specimen: BLOOD LEFT ARM  Result Value Ref Range Status   Specimen Description BLOOD LEFT ARM  Final   Special Requests   Final    BOTTLES DRAWN AEROBIC AND ANAEROBIC Blood Culture results may not be optimal due to an inadequate volume of blood received in culture bottles   Culture  Setup Time   Final    GRAM POSITIVE COCCI AEROBIC BOTTLE ONLY CRITICAL RESULT CALLED TO, READ BACK BY AND VERIFIED WITH: PHARMD E MARTIN [id number] AT 1107 AM BY CM    Culture (A)  Final    STAPHYLOCOCCUS AUREUS SUSCEPTIBILITIES PERFORMED ON PREVIOUS CULTURE WITHIN THE LAST 5 DAYS. Performed at [facility], [place] [address], [place],  [address]    Report Status 01/10/2019 FINAL  Final  SARS Coronavirus 2 (CEPHEID - Performed in [place]  hospital lab), Hosp Order     Status: None   Collection Time: 01/07/19  5:35 PM   Specimen: Nasopharyngeal Swab  Result Value Ref Range Status   SARS Coronavirus 2 NEGATIVE NEGATIVE Final    Comment: (NOTE) If result is NEGATIVE SARS-CoV-2 target nucleic acids are NOT DETECTED. The SARS-CoV-2 RNA is generally detectable in upper and lower  respiratory specimens during the acute phase of infection. The lowest  concentration of SARS-CoV-2 viral copies this assay can detect is 250  copies / mL. A negative result does not preclude SARS-CoV-2 infection  and should not be used as the sole basis for treatment or other  patient management decisions.  A negative result may occur with  improper specimen collection / handling, submission of specimen other  than nasopharyngeal swab, presence of viral mutation(s) within the  areas targeted by this assay, and inadequate number of viral copies  (<250 copies / mL). A negative result must be combined with clinical  observations, patient history, and epidemiological information. If result is POSITIVE SARS-CoV-2 target nucleic acids are DETECTED. The SARS-CoV-2 RNA is generally detectable in upper and lower  respiratory specimens dur ing the acute phase of infection.  Positive  results are indicative of active infection with SARS-CoV-2.  Clinical  correlation with patient history and other diagnostic information is  necessary to determine patient infection status.  Positive results do  not rule out bacterial infection or co-infection with other viruses. If result is PRESUMPTIVE POSTIVE SARS-CoV-2 nucleic acids MAY BE PRESENT.   A presumptive positive result was obtained on the submitted specimen  and confirmed on repeat testing.  While 2019 novel coronavirus  (SARS-CoV-2) nucleic acids may be present in the submitted sample  additional confirmatory testing may be necessary for epidemiological  and / or clinical management purposes  to differentiate  between  SARS-CoV-2 and other Sarbecovirus currently known to infect humans.  If clinically indicated additional testing with an alternate test  methodology [phone]([id number]) is advised. The SARS-CoV-2 RNA is generally  detectable in upper and lower respiratory sp ecimens during the acute  phase of infection. The expected result is Negative. Fact Sheet for Patients:  BoilerBrush.com.cyhttps://www.fda.gov/media/136312/download Fact Sheet for Healthcare Providers: https://pope.com/https://www.fda.gov/media/136313/download This test is not yet approved or cleared by the [place] FDA and has been authorized for detection and/or diagnosis of SARS-CoV-2 by FDA under an Emergency Use Authorization (EUA).  This EUA will remain in effect (meaning this test can be used) for the duration of the COVID-19 declaration under Section 564(b)(1) of the Act, 21 U.S.C. section 360bbb-3(b)(1), unless the authorization is terminated or revoked sooner. Performed at [facility], [address] [place], [place] [address]   Urine culture     Status: Abnormal   Collection Time: 01/07/19  6:22 PM   Specimen: In/Out Cath Urine  Result Value Ref Range Status   Specimen Description IN/OUT CATH URINE  Final   Special Requests NONE  Final   Culture (A)  Final    40,000 COLONIES/mL STAPHYLOCOCCUS AUREUS 20,000 COLONIES/mL STAPHYLOCOCCUS SPECIES (COAGULASE NEGATIVE) 1,000 COLONIES/mL VIRIDANS STREPTOCOCCUS Standardized susceptibility testing for this organism is not available. Performed at [facility], [address] [place], [place] [address]    Report Status 01/10/2019 FINAL  Final   Organism ID, Bacteria STAPHYLOCOCCUS AUREUS (A)  Final   Organism ID, Bacteria STAPHYLOCOCCUS SPECIES (COAGULASE NEGATIVE) (A)  Final      Susceptibility   Staphylococcus aureus - MIC*    CIPROFLOXACIN >=8 RESISTANT Resistant     GENTAMICIN <=0.5 SENSITIVE Sensitive     NITROFURANTOIN <=16 SENSITIVE Sensitive     OXACILLIN 0.5 SENSITIVE Sensitive      TETRACYCLINE <=1 SENSITIVE Sensitive     VANCOMYCIN 1 SENSITIVE Sensitive     TRIMETH/SULFA <=10 SENSITIVE Sensitive     CLINDAMYCIN <=0.25 SENSITIVE Sensitive     RIFAMPIN <=0.5 SENSITIVE Sensitive     Inducible Clindamycin NEGATIVE Sensitive     * 40,000 COLONIES/mL STAPHYLOCOCCUS AUREUS   Staphylococcus species (coagulase negative) - MIC*    CIPROFLOXACIN <=0.5 SENSITIVE Sensitive     GENTAMICIN <=0.5 SENSITIVE Sensitive     NITROFURANTOIN 32 SENSITIVE Sensitive     OXACILLIN <=0.25 SENSITIVE Sensitive     TETRACYCLINE <=1 SENSITIVE Sensitive     VANCOMYCIN <=0.5 SENSITIVE Sensitive     TRIMETH/SULFA <=10 SENSITIVE Sensitive     CLINDAMYCIN <=0.25 SENSITIVE Sensitive     RIFAMPIN <=0.5 SENSITIVE Sensitive     Inducible Clindamycin NEGATIVE Sensitive     * 20,000 COLONIES/mL STAPHYLOCOCCUS SPECIES (COAGULASE NEGATIVE)  Culture, blood (routine x 2)     Status: None   Collection Time: 01/09/19  8:25 AM   Specimen: BLOOD LEFT HAND  Result Value Ref Range Status   Specimen Description BLOOD  LEFT HAND  Final   Special Requests   Final    BOTTLES DRAWN AEROBIC ONLY Blood Culture adequate volume   Culture   Final    NO GROWTH 5 DAYS Performed at [facility], [address] [place], [place] [address]    Report Status 01/14/2019 FINAL  Final  Culture, blood (routine x 2)     Status: None   Collection Time: 01/09/19  8:26 AM   Specimen: BLOOD LEFT HAND  Result Value Ref Range Status   Specimen Description BLOOD LEFT HAND  Final   Special Requests   Final    BOTTLES DRAWN AEROBIC ONLY Blood Culture adequate volume   Culture   Final    NO GROWTH 5 DAYS Performed at [facility], [address] [place], [place] [address]    Report Status 01/14/2019 FINAL  Final  Culture, Urine     Status: None   Collection Time: 01/10/19 11:24 AM   Specimen: Urine, Random  Result Value Ref Range Status   Specimen Description URINE, RANDOM  Final   Special Requests NONE  Final    Culture   Final    NO GROWTH Performed at [facility], [address] [place], [place] [address]    Report Status 01/11/2019 FINAL  Final  C difficile quick scan w PCR reflex     Status: None   Collection Time: 01/11/19  3:44 PM   Specimen: STOOL  Result Value Ref Range Status   C Diff antigen NEGATIVE NEGATIVE Final   C Diff toxin NEGATIVE NEGATIVE Final   C Diff interpretation No C. difficile detected.  Final    Comment: Performed at [facility], [address] [place], [place] [address]  MRSA PCR Screening     Status: None   Collection Time: 01/11/19 11:42 PM   Specimen: Nasal Mucosa; Nasopharyngeal  Result Value Ref Range Status   MRSA by PCR NEGATIVE NEGATIVE Final    Comment:        The GeneXpert MRSA Assay (FDA approved for NASAL specimens only), is one component of a comprehensive MRSA colonization surveillance program. It is not intended to diagnose MRSA infection nor to guide or monitor treatment for MRSA infections. Performed at [facility], [address] [place], [place] [address]   Culture, blood (routine x 2)     Status: None   Collection Time: 01/12/19  1:43 AM   Specimen: BLOOD LEFT HAND  Result Value Ref Range Status   Specimen Description BLOOD LEFT HAND  Final   Special Requests   Final    BOTTLES DRAWN AEROBIC ONLY Blood Culture results may not be optimal due to an inadequate volume of blood received in culture bottles   Culture   Final    NO GROWTH 5 DAYS Performed at [facility], [address] [place], [place] [address]    Report Status 01/17/2019 FINAL  Final  Culture, blood (routine x 2)     Status: None   Collection Time: 01/12/19  1:50 AM   Specimen: BLOOD RIGHT HAND  Result Value Ref Range Status   Specimen Description BLOOD RIGHT HAND  Final   Special Requests   Final    BOTTLES DRAWN AEROBIC ONLY Blood Culture adequate volume   Culture   Final    NO GROWTH 5 DAYS Performed at [facility],  [address] [place], [place] [address]    Report  Status 01/17/2019 FINAL  Final  Culture, respiratory (non-expectorated)     Status: None   Collection Time: 01/12/19 12:47 PM   Specimen: Tracheal Aspirate; Respiratory  Result Value Ref Range Status   Specimen Description TRACHEAL ASPIRATE  Final   Special Requests NONE  Final   Gram Stain   Final    RARE WBC PRESENT, PREDOMINANTLY PMN NO ORGANISMS SEEN Performed at [facility], [address] [place], [place] [address]    Culture FEW STENOTROPHOMONAS MALTOPHILIA  Final   Report Status 01/15/2019 FINAL  Final   Organism ID, Bacteria STENOTROPHOMONAS MALTOPHILIA  Final      Susceptibility   Stenotrophomonas maltophilia - MIC*    LEVOFLOXACIN 2 SENSITIVE Sensitive     TRIMETH/SULFA 80 RESISTANT Resistant     * FEW STENOTROPHOMONAS MALTOPHILIA  Surgical PCR screen     Status: Abnormal   Collection Time: 01/13/19  9:00 AM   Specimen: Nasal Mucosa; Nasal Swab  Result Value Ref Range Status   MRSA, PCR NEGATIVE NEGATIVE Final   Staphylococcus aureus POSITIVE (A) NEGATIVE Final    Comment: (NOTE) The Xpert SA Assay (FDA approved for NASAL specimens in patients 64 years of age and older), is one component of a comprehensive surveillance program. It is not intended to diagnose infection nor to guide or monitor treatment. Performed at [facility], [address] [place], [place] [address]   Aerobic/Anaerobic Culture (surgical/deep wound)     Status: Abnormal (Preliminary result)   Collection Time: 01/13/19 12:37 PM   Specimen: Abscess  Result Value Ref Range Status   Specimen Description ABSCESS UPPER BACK  Final   Special Requests NONE  Final   Gram Stain   Final    NO WBC SEEN FEW GRAM POSITIVE COCCI IN PAIRS Performed at [facility], [address] [place], [place] [address]    Culture (A)  Final    STAPHYLOCOCCUS AUREUS NO ANAEROBES ISOLATED; CULTURE IN PROGRESS FOR 5 DAYS    Report Status PENDING   Incomplete   Organism ID, Bacteria STAPHYLOCOCCUS AUREUS  Final      Susceptibility   Staphylococcus aureus - MIC*    CIPROFLOXACIN >=8 RESISTANT Resistant     ERYTHROMYCIN <=0.25 SENSITIVE Sensitive     GENTAMICIN <=0.5 SENSITIVE Sensitive     OXACILLIN 0.5 SENSITIVE Sensitive     TETRACYCLINE <=1 SENSITIVE Sensitive     VANCOMYCIN <=0.5 SENSITIVE Sensitive     TRIMETH/SULFA <=10 SENSITIVE Sensitive     CLINDAMYCIN <=0.25 SENSITIVE Sensitive     RIFAMPIN <=0.5 SENSITIVE Sensitive     Inducible Clindamycin NEGATIVE Sensitive     * STAPHYLOCOCCUS AUREUS    Impression/Plan:  1. Bacteremia - MSSA with abscess in back, s/p debridement.  On Unasyn and will need a prolonged course of Ancef for 6 weeks total.  TEE unable to be done safely.  Unasyn through tomorrow then Ancef through August 30th.    2. Abscess - debrided and stable.  On treatment as above.

## 2019-01-17 NOTE — Progress Notes (Signed)
eLink Physician-Brief Progress Note Patient Name: Havard Radigan DOB: 12/05/1955 MRN: [id number]   Date of Service  01/17/2019  HPI/Events of Note  Low K. Lap restraints request.  eICU Interventions  Camera: trying to go out of bed. On precedex 1.2 discussed with bed side RN.  Kcl 10 meq q1 hr x 3 doses. Non violent restraints -lap buddy ordered. Caution to watch him , while on lap buddy. As per bed side wrist restriants might make him worse. Want to avoid roll over out of bed to floor.      Intervention Category Intermediate Interventions: Electrolyte abnormality - evaluation and management Minor Interventions: Agitation / anxiety - evaluation and management  Elmer Sow 01/17/2019, 9:01 PM

## 2019-01-17 NOTE — Progress Notes (Signed)
Assisted tele visit to patient with daughter. ° °Prisilla Kocsis P, RN  °

## 2019-01-17 NOTE — Progress Notes (Signed)
[facility] ADULT ICU REPLACEMENT PROTOCOL FOR AM LAB REPLACEMENT ONLY  The patient does apply for the [facility] Adult ICU Electrolyte Replacment Protocol based on the criteria listed below:   1. Is GFR >/= 40 ml/min? Yes.    Patient's GFR today is >60 2. Is urine output >/= 0.5 ml/kg/hr for the last 6 hours? Yes.   Patient's UOP is 3.6 ml/kg/hr 3. Is BUN < 60 mg/dL? Yes.    Patient's BUN today is 23 4. Abnormal electrolyte(s): K2.8 5. Ordered repletion with: per protocol 6. If a panic level lab has been reported, has the CCM MD in charge been notified? Yes.  .   Physician:  Lucile Shutters, MD  Vear Clock 01/17/2019 5:53 AM

## 2019-01-17 NOTE — Progress Notes (Signed)
eLink Physician-Brief Progress Note Patient Name: Frederick Moody DOB: 10-28-1955 MRN: [id number]   Date of Service  01/17/2019  HPI/Events of Note  KUB reviewed. On precedex,   eICU Interventions  - ok to use NG tube, in place. On precedex, so no transfer out tonight.      Intervention Category Intermediate Interventions: Diagnostic test evaluation  Elmer Sow 01/17/2019, 8:01 PM

## 2019-01-17 NOTE — Progress Notes (Signed)
NAME:  Frederick Moody, MRN:  [id number], DOB:  11-29-1955, LOS: 10 ADMISSION DATE:  01/07/2019, CONSULTATION DATE:  01/11/19 CHIEF COMPLAINT:  Encephaolpathy   Brief History   This is a 63 yo with history of uncontrolled DM and etoh abuse who presents for weakness and falls. Found to have etoh withdrawal and DKA (resolved) and MSSA bacteremia likely source is abscess on upper back. Patient was admitted to CCM after being unable to protect his airway, thought to be due to excess benzodiazepenes with his withdrawal.   Past Medical History  Poorly controlled DM, ETOH abuse  Principal Problem:   MSSA bacteremia Active Problems:   DKA (diabetic ketoacidoses) ([place])   Alcohol withdrawal ([place])   Aspiration pneumonia ([place])   Sepsis ([place])   Acute encephalopathy   Acute respiratory failure ([place])   Malnutrition of moderate degree   Significant Hospital Events   01/07/19 - admitted for MSSA positive  01/11/19 - intubated and transferred to MICU 01/12/19 - OR for I & D of upper back   Consults:  ID  Surgery  Nutrition  Cardiology   Procedures:  01/11/2019-intubation 01/11/19 - Central Line 7/20 > OR for abscess drain  7/20 : I/O, foley placement   Significant Diagnostic Tests:  7/20 MRI Elbow >> severe myositis, small fluid collection, subcutaneous swelling  7/20 Ab XR Gastric tube courses into the stomach with its tip in the antropyloric region, appropriately positioned.  Ct Head Wo Contrast 7/20 CT Head w/o - no acute findings, remote infarcts, chronic axillary sinusitis 7/20 CT chest W/O-moderate layering pleural effusions with multisegmental atelectasis.  Dorsal cervical thoracic soft tissue inflammation 7/20/CT spine 11 x 5 x 6 cm abscess in the dorsal cervicothoracic soft tissues extending from the skin surface into the superficial musculature. There is extension to spinous processes without bony erosion.  7/17 TTE -normal systolic function with EF 55 to 60%.  Left atrium mildly  dilated.  Mild mitral annular calcification present.  No stenosis of aortic valve but with mild thickening and calcification.  7/17 UE U/S Focal area of soft tissue edema with surrounding Doppler flow in the medial aspect of left elbow which appears to be centered in the flexor musculature concerning for myositis. If there is further clinical concern regarding septic arthritis, recommend an MRI of the left elbow. .   7/22 left upper extremity MRI -forearm: Extensive muscle and subcutaneous edema visualized throughout the left arm consistent with cellulitis and myositis.  Humerus: Inflammation spreads to shoulder girdle dural and left upper arm and left lateral chest consistent with severe myositis.  Micro Data:  C. difficile PCR negative MRSA negative 01/07/19 - blood culture >> + MSSA (Resistant to cipro, clinda, erythro) 01/07/19 - Urine cx >> + staph aureus pan senstive (Resistant to cipro)  01/09/19 - Blood culture >> NG 01/10/19 - Urine Culture >> NG 01/12/19 - Blood culture >> NG  01/12/19 - Abscess culture >> staph aureus resistant to Cipro 01/12/19 -trach aspirate: Stenotrophomonas final  Antimicrobials:  7/20 Unasyn >>  7/16-17, 7/20 Vanc 7/16> CTX 7/16 > Ancef 7/15-7/16> Cefepime  Interim history/subjective:   Tolerating extubation overnight, no new complaints  Objective   Temp:  [97.8 F (36.6 C)-98.8 F (37.1 C)] 97.8 F (36.6 C) (07/25 0800) Pulse Rate:  [62-88] 71 (07/25 0800) Resp:  [15-35] 26 (07/25 0800) BP: (96-150)/(53-107) 146/79 (07/25 0800) SpO2:  [95 %-100 %] 100 % (07/25 0800) Weight:  [88.2 kg] 88.2 kg (07/25 0500)  CVP:  [5 mmHg-8 mmHg]  8 mmHg     Intake/Output      07/24 0701 - 07/25 0700 07/25 0701 - 07/26 0700   I.V. (mL/kg) 849.2 (9.6)    NG/GT 1200    IV Piggyback 810.4    Total Intake(mL/kg) 2859.6 (32.4)    Urine (mL/kg/hr) 8400 (4) 500 (2.8)   Stool 750    Total Output 9150 500   Net -6290.4 -500           Filed Weights   01/15/19 0500  01/16/19 0442 01/17/19 0500  Weight: 96 kg 96.4 kg 88.2 kg   Examination: General: Awake but confused, well appearing Lungs: CTA bilaterally HEENT: [place]/AT, PERRL, EOM-I and MMM Cardiovascular: RRR, Nl S1/S2 and -M/R/G Abdomen: Soft, NT, ND and +BS Extremities: -edema and -tenderness Neuro:  Confused but awake and oriented to self GU: Foley cath in place  I reviewed CXR myself, no acute disease noted  Assessment & Plan:  MSSA Bacteremia likely due to soft tissue abscess on upper back, s/p drainage in the OR. TEE will not be obtained at this time per cardiology. Per ID, patient will need long course of ancef as patient is not getting TEE. White count is stable. No fevers. Continue on unasyn for steno on trach aspirate x 7 days followed by ancef.  - Continue Unasyn, (7/20, day 6 of 7)  - Blood culture if fever   Acute hypoxic respiratory failure Extubated and stable on nasal canula. Will continue to wean precedex. Patient remains confused. Will watch respiratory status in ICU and likely d/c to floor this weekend. Continue with lasix 80mg  q 8 hours for volume status and pulmonary edema.  - D/C lasix  - Titrate O2 for sat of 88-92%  Aspiration pneumonia presumed aspiration pneumonia given findings at intubation and continued fevers.  No consolidation appreciated on chest x-ray previously patient currently on Unasyn for steno. - swallow study pending   Confusion  Likely ICU delirium and medications.  - PT/OT - D/C foley if able - OOB  Severe Myositis & cellulitis of LUE  Orthopedic surgery consulted yesterday and recommend supportive measures with no drainable fluid at this time.  They have signed off.  CK was obtained and 62 - Monitor   ETOH  Out of the window for alcohol withdrawal.  Diabetes  A1c 11.7.  CBGs in the mid 200s. Currently on detemir 20 units nightly with sliding scale every 4 hours.   Anemia stable at baseline, 10.5 Lovenox added back today   Nutrition  nutrition on board.   D/c'ed tube feeds. Hold PO medications until swallow study  - swallow study   Electrolytes within normal limits. Lab draws for K 3x this morning. Two were hemolyzed, anothyer 3.5. EKG wnl. Will recheck at 1700 today.  Daily BMP w/ Mg and Phos  Best practice:  Diet: NPO Pain/Anxiety/Delirium protocol (if indicated): ibuprofen, lip balm, LORazepam, polyvinyl alcohol  VAP protocol (if indicated): No DVT prophylaxis: LOVENOX GI prophylaxis: PTX Glucose control: detemir and ssI  Mobility: BR Code Status: Full  Family Communication: Sister daily  Disposition: Progressive and to [facility] with PCCM off 7/26  Discussed with [facility]-MD  Alyson ReedyWesam G. Yacoub, M.D. [facility]. Pager: [phone]. After hours pager: [phone].

## 2019-01-17 NOTE — Progress Notes (Addendum)
  Speech Language Pathology Treatment: Dysphagia  Patient Details Name: Frederick Moody MRN: [id number] DOB: 1955/10/07 Today's Date: 01/17/2019 Time: 0902-0922 SLP Time Calculation (min) (ACUTE ONLY): 20 min  Assessment / Plan / Recommendation Clinical Impression  He has improved from an alertness and cognitive standpoint. Aware of ice chip, masticated and initiated swallow timely. Single and sequential cup sips water followed by immediate cough with all trials. Suspect pharyngeal residue given 3-4 additional swallows present with puree. Would need instrumental testing to determine safest consistencies if po's recommended. He is improving following 6 day intubation but would benefit from increased time post extubation and plan to check on tomorrow as schedule allows. MD ordering NGT.    HPI HPI: Pt is a 63 yo male with hx of uncontrolled DM and EtOH abuse who presents with weakness and falls. Pt admitted with DKA, sepsis with MSSA bacteremia and abscess on back (s/p I&D 7/21), AMS with EtOH withdrawal and possible benzo OD and aspiration requiring intubation 7/19-7/24.       SLP Plan  Continue with current plan of care       Recommendations  Diet recommendations: NPO Medication Administration: Via alternative means                Oral Care Recommendations: Oral care QID Follow up Recommendations: Other (comment)(TBD) SLP Visit Diagnosis: Dysphagia, unspecified (R13.10) Plan: Continue with current plan of care       GO                Houston Siren 01/17/2019, 9:26 AM  Orbie Pyo Colvin Caroli.Ed Risk analyst [phone] Office [phone]

## 2019-01-18 DIAGNOSIS — E440 Moderate protein-calorie malnutrition: Secondary | ICD-10-CM

## 2019-01-18 DIAGNOSIS — F10231 Alcohol dependence with withdrawal delirium: Secondary | ICD-10-CM

## 2019-01-18 LAB — GLUCOSE, CAPILLARY
Glucose-Capillary: 126 mg/dL — ABNORMAL HIGH (ref 70–99)
Glucose-Capillary: 155 mg/dL — ABNORMAL HIGH (ref 70–99)
Glucose-Capillary: 200 mg/dL — ABNORMAL HIGH (ref 70–99)
Glucose-Capillary: 222 mg/dL — ABNORMAL HIGH (ref 70–99)
Glucose-Capillary: 263 mg/dL — ABNORMAL HIGH (ref 70–99)
Glucose-Capillary: 267 mg/dL — ABNORMAL HIGH (ref 70–99)
Glucose-Capillary: 331 mg/dL — ABNORMAL HIGH (ref 70–99)

## 2019-01-18 LAB — CBC WITH DIFFERENTIAL/PLATELET
Abs Immature Granulocytes: 0.04 10*3/uL (ref 0.00–0.07)
Basophils Absolute: 0 10*3/uL (ref 0.0–0.1)
Basophils Relative: 0 %
Eosinophils Absolute: 0 10*3/uL (ref 0.0–0.5)
Eosinophils Relative: 1 %
HCT: 25.9 % — ABNORMAL LOW (ref 39.0–52.0)
Hemoglobin: 8.4 g/dL — ABNORMAL LOW (ref 13.0–17.0)
Immature Granulocytes: 1 %
Lymphocytes Relative: 12 %
Lymphs Abs: 0.8 10*3/uL (ref 0.7–4.0)
MCH: 32.1 pg (ref 26.0–34.0)
MCHC: 32.4 g/dL (ref 30.0–36.0)
MCV: 98.9 fL (ref 80.0–100.0)
Monocytes Absolute: 0.3 10*3/uL (ref 0.1–1.0)
Monocytes Relative: 4 %
Neutro Abs: 5.6 10*3/uL (ref 1.7–7.7)
Neutrophils Relative %: 82 %
Platelets: 195 10*3/uL (ref 150–400)
RBC: 2.62 MIL/uL — ABNORMAL LOW (ref 4.22–5.81)
RDW: 13.2 % (ref 11.5–15.5)
WBC: 6.7 10*3/uL (ref 4.0–10.5)
nRBC: 0 % (ref 0.0–0.2)

## 2019-01-18 LAB — COMPREHENSIVE METABOLIC PANEL
ALT: 32 U/L (ref 0–44)
AST: 57 U/L — ABNORMAL HIGH (ref 15–41)
Albumin: 1.6 g/dL — ABNORMAL LOW (ref 3.5–5.0)
Alkaline Phosphatase: 89 U/L (ref 38–126)
Anion gap: 11 (ref 5–15)
BUN: 22 mg/dL (ref 8–23)
CO2: 27 mmol/L (ref 22–32)
Calcium: 7.6 mg/dL — ABNORMAL LOW (ref 8.9–10.3)
Chloride: 109 mmol/L (ref 98–111)
Creatinine, Ser: 1.1 mg/dL (ref 0.61–1.24)
GFR calc Af Amer: >60 mL/min (ref >60)
GFR calc non Af Amer: >60 mL/min (ref >60)
Glucose, Bld: 182 mg/dL — ABNORMAL HIGH (ref 70–99)
Potassium: 3.9 mmol/L (ref 3.5–5.1)
Sodium: 147 mmol/L — ABNORMAL HIGH (ref 135–145)
Total Bilirubin: 1.2 mg/dL (ref 0.3–1.2)
Total Protein: 6.2 g/dL — ABNORMAL LOW (ref 6.5–8.1)

## 2019-01-18 LAB — MAGNESIUM: Magnesium: 1.7 mg/dL (ref 1.7–2.4)

## 2019-01-18 LAB — AEROBIC/ANAEROBIC CULTURE W GRAM STAIN (SURGICAL/DEEP WOUND): Gram Stain: NONE SEEN

## 2019-01-18 LAB — PHOSPHORUS: Phosphorus: 5.8 mg/dL — ABNORMAL HIGH (ref 2.5–4.6)

## 2019-01-18 MED ORDER — OLANZAPINE 5 MG PO TABS
5.0000 mg | ORAL_TABLET | Freq: Every day | ORAL | Status: DC
  Administered 2019-01-18 – 2019-01-25 (×8): 5 mg via NASOGASTRIC
  Filled 2019-01-18 (×8): qty 1

## 2019-01-18 MED ORDER — FREE WATER
200.0000 mL | Freq: Three times a day (TID) | Status: DC
  Administered 2019-01-18 – 2019-01-19 (×3): 200 mL

## 2019-01-18 MED ORDER — CHLORHEXIDINE GLUCONATE CLOTH 2 % EX PADS
6.0000 | MEDICATED_PAD | Freq: Every day | CUTANEOUS | Status: DC
  Administered 2019-01-18 – 2019-02-09 (×22): 6 via TOPICAL

## 2019-01-18 MED ORDER — HALOPERIDOL LACTATE 5 MG/ML IJ SOLN
1.0000 mg | INTRAMUSCULAR | Status: DC | PRN
  Administered 2019-01-18 (×2): 4 mg via INTRAVENOUS
  Administered 2019-01-18: 3 mg via INTRAVENOUS
  Administered 2019-01-18 – 2019-01-19 (×3): 4 mg via INTRAVENOUS
  Filled 2019-01-18 (×6): qty 1

## 2019-01-18 NOTE — Progress Notes (Signed)
PHARMACY CONSULT NOTE FOR:  OUTPATIENT  PARENTERAL ANTIBIOTIC THERAPY (OPAT)  Indication: MSSA bacteremia Regimen: Cefazolin 2g IV every 8 hours End date: 02/22/19  IV antibiotic discharge orders are pended. To discharging provider:  please sign these orders via discharge navigator,  Select New Orders & click on the button choice - Manage This Unsigned Work.     Thank you for allowing pharmacy to be a part of this patient's care.  Alycia Rossetti, PharmD, BCPS Pager: [phone] 1:54 PM

## 2019-01-18 NOTE — Progress Notes (Signed)
This Probation officer spoke with pt daughter [name].  Answered all questions to satisfaction. Irven Baltimore, RN

## 2019-01-18 NOTE — Progress Notes (Signed)
Rehab Admissions Coordinator Note:  Patient was screened by Cleatrice Burke for appropriateness for an Inpatient Acute Rehab Consult per PT recommendation..  At this time, we are recommending Inpatient Rehab consult. Please place order for consult.  Cleatrice Burke RN MSN 01/18/2019, 10:01 AM  I can be reached at [phone].

## 2019-01-18 NOTE — Progress Notes (Signed)
PROGRESS NOTE    Frederick Moody  WUJ:[id number] DOB: [birth year] DOA: 01/07/2019 PCP: Patient, No Pcp Per    Brief Narrative:  63 year old male who presented with weakness and falls.  He does have significant past medical history for uncontrolled diabetes mellitus.  He reported weakness for about 3 days, generalized and worsening, associated with lightheadedness and dizziness.  About a week ago he noticed an upper back nodule which self drained purulent material.  On his initial physical examination he was febrile 38.4, tachycardic 130 bpm, blood pressure 150/90, respiratory rate 18, oxygen saturation 99%.  Moist mucous membranes, his lungs were clear to auscultation bilaterally, heart S1-2 present tachycardic, abdomen was soft collar extremity edema.  Upper thoracic 5 cm diameter ulcerated wound with purulent drainage.  Sodium 123, potassium 5.3, chloride 83, bicarb 19, glucose 684, BUN 24, creatinine 1.27, anion gap 21, white count 24.5, hemoglobin 14.5, hematocrit 41.0, platelets 277, SARS COVID-19 was negative.  Urinalysis more than 500 glucose, 0-5 white cells.  His blood cultures grew MSSA.  His chest x-ray was negative for infiltrates.  EKG 132 bpm, left axis deviation, normal intervals, sinus rhythm, J-point elevation V1 through V3, no significant T wave changes.  Patient was admitted with a working diagnosis of diabetes ketoacidosis, complicated with sepsis due to MSSA bacteremia (present on admission).  Patient developed severe alcohol withdrawal, received high doses of benzodiazepines to control his symptoms and required intubation to protect his airway.  January 11, 2019 intubation.   Patient was diagnosed with a large dorsal cervical thoracic abscess 11 x 5 x 6 cm from the skin into the superficial musculature.  Extension to spinous process without bony erosion.  He underwent or abscess drain July 20.   July 22 he was found to have extensive muscle subcutaneous edema consistent with  cellulitis and myositis in his left upper extremity per MRI.  Patient was liberated from mechanical ventilation July 25.  Transferred to [facility] July 26.   Assessment & Plan:   Principal Problem:   MSSA bacteremia Active Problems:   DKA (diabetic ketoacidoses) ([place])   Alcohol withdrawal ([facility])   Aspiration pneumonia ([place])   Sepsis ([place])   Acute encephalopathy   Acute respiratory failure ([facility])   Malnutrition of moderate degree   Hypokalemia   Pressure injury of skin   1. Acute toxic metabolic encephalopathy, present on admission, with features of delirium/ alcohol withdrawal syndrome/ complicated with acute hypoxic respiratory failure. Precedex has been discontinued this am, patient continue to be agitated, on physical restrains, failed swallow evaluation. Tolerating tube feedings. Will continue as needed lorazepam, haldol and ill add qhs olanzapine. Continue neuro checks, fall and aspiration precautions.   Patient now liberated from mechanical ventilation, continue oxymetry monitoring in the progressive care unit.   2. Sepsis due to MSSA bacteremia, present on admission. Dorsal cervico thoracic abscess, left upper extremity cellulitis, myositis. T max 100,6 on 01/17/19 at 23:23 Hrs, wbc this am is 6,7. Recent cultures from the blood 01/12/19 with no growth. Patient not candidate for TEE. Plan to continue antibiotic therapy with ancef. Continue pain control.   3. T2DM with DKA on admission, uncontrolled hyperglycemia. This am glucose is 182, will continue glucose monitoring with insulin sliding scale. Resumed tube feedings.   3. Hypokalemia. Serum K at 3,9 with preserved renal function, follow on renal panel in am.   4. Calorie protein malnutrition. Continue tube feedings.   5. Sacrum pressure ulcer not present on admission, not able to stage, continue with local wound  care.   6. Aspiration pneumonia (not present on admission). Continue Unasyn for now.    DVT prophylaxis: enoxaparin    Code Status: full Family Communication: no family at the bedside  Disposition Plan/ discharge barriers: pending clinical improvement.   Body mass index is 28.71 kg/m. Malnutrition Type:  Nutrition Problem: Moderate Malnutrition Etiology: chronic illness(poorly controlled DM, EtOH abuse, abscess)   Malnutrition Characteristics:  Signs/Symptoms: percent weight loss, mild muscle depletion   Nutrition Interventions:  Interventions: Tube feeding  RN Pressure Injury Documentation: Pressure Injury 01/15/19 Sacrum Medial Deep Tissue Injury - Purple or maroon localized area of discolored intact skin or blood-filled blister due to damage of underlying soft tissue from pressure and/or shear. (Active)  01/15/19 2100  Location: Sacrum  Location Orientation: Medial  Staging: Deep Tissue Injury - Purple or maroon localized area of discolored intact skin or blood-filled blister due to damage of underlying soft tissue from pressure and/or shear.  Wound Description (Comments):   Present on Admission: No     Consultants:   Orthopedics   Procedures:   Back abscess drainage   Antimicrobials:   Cefazolin.     Subjective: Precedex discontinued this am, patient continue to be delirious and agitated, required haldol and lorazepam. Continue to be NPO, had diarrhea while on tube feedings, continue to be on physical restrains for patient's safety.   Objective: Vitals:   01/18/19 0800 01/18/19 0900 01/18/19 1000 01/18/19 1100  BP: 128/77 (!) 165/89 128/87   Pulse: 74 73 70   Resp: (!) 35 (!) 28 (!) 21   Temp:    98.9 F (37.2 C)  TempSrc:    Axillary  SpO2: 100% 97% 100%   Weight:      Height:        Intake/Output Summary (Last 24 hours) at 01/18/2019 1152 Last data filed at 01/18/2019 1000 Gross per 24 hour  Intake 1984.95 ml  Output 1165 ml  Net 819.95 ml   Filed Weights   01/16/19 0442 01/17/19 0500 01/18/19 0500  Weight: 96.4 kg 88.2 kg 88.2 kg    Examination:    General: deconditioned and ill looking appearing  Neurology: Awake, not following commands, responds to questions with yes and no, continue to be on mittens, and waist belt restrain.  E ENT: no pallor, no icterus, oral mucosa moist Cardiovascular: No JVD. S1-S2 present, rhythmic, no gallops, rubs, or murmurs. No lower extremity edema. Pulmonary: positive breath sounds bilaterally, no wheezing, rhonchi or rales. Gastrointestinal. Abdomen with no organomegaly, non tender, no rebound or guarding Skin. No rashes Musculoskeletal: no joint deformities     Data Reviewed: I have personally reviewed following labs and imaging studies  CBC: Recent Labs  Lab 01/14/19 0446 01/15/19 0509 01/16/19 0457 01/17/19 0424 01/18/19 0321  WBC 11.9* 9.2 8.1 7.0 6.7  NEUTROABS 10.8* 7.7 7.3 5.7 5.6  HGB 10.6* 9.0* 9.6* 9.0* 8.4*  HCT 32.0* 27.3* 28.7* 27.4* 25.9*  MCV 97.3 98.9 98.3 98.6 98.9  PLT 274 236 220 217 010   Basic Metabolic Panel: Recent Labs  Lab 01/13/19 0434 01/14/19 0446 01/15/19 0509 01/16/19 0800 01/16/19 1700 01/17/19 0424 01/17/19 1951 01/18/19 0321  NA 136 138 140 141 146* 147* 148* 147*  K 3.7 4.2 3.7 6.5* 3.0* 2.8* 3.4* 3.9  CL 109 112* 110 112* 111 109 110 109  CO2 19* 19* 23 23 27 30 26 27   GLUCOSE 167* 253* 177* 167* 184* 51* 138* 182*  BUN 13 22 32* 32* 26* 23 21 22  CREATININE 0.76 0.81 0.91 0.82 0.86 0.79 1.00 1.10  CALCIUM 6.5* 6.8* 7.1* 7.3* 8.0* 8.1* 7.8* 7.6*  MG 1.9 2.0 2.1 1.8  --   --   --  1.7  PHOS 3.3 3.3 3.0 3.8  --   --   --  5.8*   GFR: Estimated Creatinine Clearance: 75.5 mL/min (by C-G formula based on SCr of 1.1 mg/dL). Liver Function Tests: Recent Labs  Lab 01/14/19 0446 01/15/19 0509 01/16/19 0800 01/17/19 0424 01/18/19 0321  AST 42* 51* 83* 50* 57*  ALT 34 30 41 31 32  ALKPHOS 100 111 120 101 89  BILITOT 1.4* 0.9 2.3* 0.9 1.2  PROT 5.6* 5.3* 5.5* 6.0* 6.2*  ALBUMIN 1.1* 1.1* 1.3* 1.5* 1.6*   No results for input(s): LIPASE,  AMYLASE in the last 168 hours. No results for input(s): AMMONIA in the last 168 hours. Coagulation Profile: Recent Labs  Lab 01/13/19 0434  INR 1.5*   Cardiac Enzymes: Recent Labs  Lab 01/15/19 1249  CKTOTAL 62   BNP (last 3 results) No results for input(s): PROBNP in the last 8760 hours. HbA1C: No results for input(s): HGBA1C in the last 72 hours. CBG: Recent Labs  Lab 01/17/19 1944 01/17/19 2321 01/18/19 0323 01/18/19 0726 01/18/19 1137  GLUCAP 133* 126* 155* 200* 222*   Lipid Profile: No results for input(s): CHOL, HDL, LDLCALC, TRIG, CHOLHDL, LDLDIRECT in the last 72 hours. Thyroid Function Tests: No results for input(s): TSH, T4TOTAL, FREET4, T3FREE, THYROIDAB in the last 72 hours. Anemia Panel: No results for input(s): VITAMINB12, FOLATE, FERRITIN, TIBC, IRON, RETICCTPCT in the last 72 hours.    Radiology Studies: I have reviewed all of the imaging during this hospital visit personally     Scheduled Meds: . chlorhexidine gluconate (MEDLINE KIT)  15 mL Mouth Rinse BID  . Chlorhexidine Gluconate Cloth  6 each Topical Q0600  . enoxaparin (LOVENOX) injection  40 mg Subcutaneous Q24H  . folic acid  1 mg Oral Daily  . free water  200 mL Per Tube Q8H  . insulin aspart  0-9 Units Subcutaneous Q4H  . insulin detemir  20 Units Subcutaneous QHS  . multivitamin with minerals  1 tablet Oral Daily  . thiamine  100 mg Oral Daily   Continuous Infusions: . sodium chloride 10 mL/hr at 01/18/19 0700  . ampicillin-sulbactam (UNASYN) IV Stopped (01/18/19 0855)  . [START ON 01/19/2019]  ceFAZolin (ANCEF) IV    . famotidine (PEPCID) IV Stopped (01/18/19 0004)  . feeding supplement (VITAL AF 1.2 CAL) 75 mL/hr at 01/18/19 0700     LOS: 11 days        Cariana Karge Gerome Apley, MD

## 2019-01-18 NOTE — Progress Notes (Signed)
SLP Cancellation Note  Patient Details Name: Frederick Moody MRN: [id number] DOB: Mar 20, 1956   Cancelled treatment:       Reason Eval/Treat Not Completed: Medical issues which prohibited therapy   Per nurse the patient is back on Precedex and is unable to come off.  Patient currently has a source of nutrition.  ST will follow up next date.  Shelly Flatten, MA, [facility] [facility] SLP [phone]  [name] 01/18/2019, 8:03 AM

## 2019-01-19 ENCOUNTER — Inpatient Hospital Stay (HOSPITAL_COMMUNITY): Payer: Medicaid Other

## 2019-01-19 DIAGNOSIS — J189 Pneumonia, unspecified organism: Secondary | ICD-10-CM

## 2019-01-19 DIAGNOSIS — L89150 Pressure ulcer of sacral region, unstageable: Secondary | ICD-10-CM

## 2019-01-19 DIAGNOSIS — R0603 Acute respiratory distress: Secondary | ICD-10-CM

## 2019-01-19 LAB — POCT I-STAT 7, (LYTES, BLD GAS, ICA,H+H)
Acid-Base Excess: 7 mmol/L — ABNORMAL HIGH (ref 0.0–2.0)
Acid-Base Excess: 11 mmol/L — ABNORMAL HIGH (ref 0.0–2.0)
Bicarbonate: 30.5 mmol/L — ABNORMAL HIGH (ref 20.0–28.0)
Bicarbonate: 34.6 mmol/L — ABNORMAL HIGH (ref 20.0–28.0)
Calcium, Ion: 1.2 mmol/L (ref 1.15–1.40)
Calcium, Ion: 1.2 mmol/L (ref 1.15–1.40)
HCT: 28 % — ABNORMAL LOW (ref 39.0–52.0)
HCT: 23 % — ABNORMAL LOW (ref 39.0–52.0)
Hemoglobin: 9.5 g/dL — ABNORMAL LOW (ref 13.0–17.0)
Hemoglobin: 7.8 g/dL — ABNORMAL LOW (ref 13.0–17.0)
O2 Saturation: 97 %
O2 Saturation: 100 %
Patient temperature: 101
Patient temperature: 101.3
Potassium: 3.6 mmol/L (ref 3.5–5.1)
Potassium: 4.1 mmol/L (ref 3.5–5.1)
Sodium: 154 mmol/L — ABNORMAL HIGH (ref 135–145)
Sodium: 154 mmol/L — ABNORMAL HIGH (ref 135–145)
TCO2: 32 mmol/L (ref 22–32)
TCO2: 36 mmol/L — ABNORMAL HIGH (ref 22–32)
pCO2 arterial: 39.3 mmHg (ref 32.0–48.0)
pCO2 arterial: 43.7 mmHg (ref 32.0–48.0)
pH, Arterial: 7.502 — ABNORMAL HIGH (ref 7.350–7.450)
pH, Arterial: 7.512 — ABNORMAL HIGH (ref 7.350–7.450)
pO2, Arterial: 86 mmHg (ref 83.0–108.0)
pO2, Arterial: 512 mmHg — ABNORMAL HIGH (ref 83.0–108.0)

## 2019-01-19 LAB — BASIC METABOLIC PANEL
Anion gap: 9 (ref 5–15)
BUN: 25 mg/dL — ABNORMAL HIGH (ref 8–23)
CO2: 27 mmol/L (ref 22–32)
Calcium: 7.8 mg/dL — ABNORMAL LOW (ref 8.9–10.3)
Chloride: 112 mmol/L — ABNORMAL HIGH (ref 98–111)
Creatinine, Ser: 0.92 mg/dL (ref 0.61–1.24)
GFR calc Af Amer: >60 mL/min (ref >60)
GFR calc non Af Amer: >60 mL/min (ref >60)
Glucose, Bld: 306 mg/dL — ABNORMAL HIGH (ref 70–99)
Potassium: 3.1 mmol/L — ABNORMAL LOW (ref 3.5–5.1)
Sodium: 148 mmol/L — ABNORMAL HIGH (ref 135–145)

## 2019-01-19 LAB — GLUCOSE, CAPILLARY
Glucose-Capillary: 271 mg/dL — ABNORMAL HIGH (ref 70–99)
Glucose-Capillary: 249 mg/dL — ABNORMAL HIGH (ref 70–99)
Glucose-Capillary: 240 mg/dL — ABNORMAL HIGH (ref 70–99)
Glucose-Capillary: 303 mg/dL — ABNORMAL HIGH (ref 70–99)
Glucose-Capillary: 250 mg/dL — ABNORMAL HIGH (ref 70–99)

## 2019-01-19 MED ORDER — ETOMIDATE 2 MG/ML IV SOLN
INTRAVENOUS | Status: AC
  Filled 2019-01-19: qty 20

## 2019-01-19 MED ORDER — POTASSIUM CHLORIDE 20 MEQ/15ML (10%) PO SOLN
40.0000 meq | ORAL | Status: AC
  Administered 2019-01-19 (×2): 40 meq
  Filled 2019-01-19 (×2): qty 30

## 2019-01-19 MED ORDER — MIDAZOLAM HCL 2 MG/2ML IJ SOLN
2.0000 mg | Freq: Once | INTRAMUSCULAR | Status: AC
  Administered 2019-01-19: 2 mg via INTRAVENOUS

## 2019-01-19 MED ORDER — MIDAZOLAM HCL 2 MG/2ML IJ SOLN
INTRAMUSCULAR | Status: AC
  Filled 2019-01-19: qty 4

## 2019-01-19 MED ORDER — FUROSEMIDE 10 MG/ML IJ SOLN
40.0000 mg | Freq: Once | INTRAMUSCULAR | Status: AC
  Administered 2019-01-19: 11:00:00 40 mg via INTRAVENOUS
  Filled 2019-01-19: qty 4

## 2019-01-19 MED ORDER — ROCURONIUM BROMIDE 10 MG/ML (PF) SYRINGE
PREFILLED_SYRINGE | INTRAVENOUS | Status: AC
  Filled 2019-01-19: qty 10

## 2019-01-19 MED ORDER — FENTANYL CITRATE (PF) 100 MCG/2ML IJ SOLN
100.0000 ug | Freq: Once | INTRAMUSCULAR | Status: AC
  Administered 2019-01-19: 100 ug via INTRAVENOUS

## 2019-01-19 MED ORDER — ORAL CARE MOUTH RINSE
15.0000 mL | OROMUCOSAL | Status: DC
  Administered 2019-01-19 – 2019-01-21 (×22): 15 mL via OROMUCOSAL

## 2019-01-19 MED ORDER — METOLAZONE 5 MG PO TABS
5.0000 mg | ORAL_TABLET | Freq: Every day | ORAL | Status: AC
  Administered 2019-01-19: 13:00:00 5 mg via ORAL
  Filled 2019-01-19: qty 1

## 2019-01-19 MED ORDER — FUROSEMIDE 10 MG/ML IJ SOLN
40.0000 mg | Freq: Four times a day (QID) | INTRAMUSCULAR | Status: AC
  Administered 2019-01-19 – 2019-01-20 (×3): 40 mg via INTRAVENOUS
  Filled 2019-01-19 (×3): qty 4

## 2019-01-19 MED ORDER — FAMOTIDINE 40 MG/5ML PO SUSR
20.0000 mg | Freq: Every day | ORAL | Status: DC
  Administered 2019-01-20 – 2019-01-23 (×4): 20 mg
  Filled 2019-01-19 (×5): qty 2.5

## 2019-01-19 MED ORDER — FREE WATER
350.0000 mL | Status: DC
  Administered 2019-01-19 – 2019-01-22 (×17): 350 mL

## 2019-01-19 MED ORDER — LORAZEPAM 2 MG/ML IJ SOLN
1.0000 mg | INTRAMUSCULAR | Status: DC | PRN
  Administered 2019-01-19 – 2019-02-04 (×15): 1 mg via INTRAVENOUS
  Filled 2019-01-19 (×16): qty 1

## 2019-01-19 MED ORDER — ETOMIDATE 2 MG/ML IV SOLN
20.0000 mg | Freq: Once | INTRAVENOUS | Status: AC
  Administered 2019-01-19: 20 mg via INTRAVENOUS

## 2019-01-19 MED ORDER — FENTANYL CITRATE (PF) 100 MCG/2ML IJ SOLN
INTRAMUSCULAR | Status: AC
  Filled 2019-01-19: qty 2

## 2019-01-19 MED ORDER — FENTANYL CITRATE (PF) 100 MCG/2ML IJ SOLN: 25.0000 ug | INTRAMUSCULAR | Status: DC | PRN

## 2019-01-19 MED ORDER — POTASSIUM CHLORIDE 20 MEQ/15ML (10%) PO SOLN
40.0000 meq | Freq: Three times a day (TID) | ORAL | Status: AC
  Administered 2019-01-19 (×2): 40 meq
  Filled 2019-01-19 (×2): qty 30

## 2019-01-19 MED ORDER — MORPHINE SULFATE (PF) 2 MG/ML IV SOLN
1.0000 mg | INTRAVENOUS | Status: DC | PRN
  Administered 2019-01-19: 1 mg via INTRAVENOUS
  Filled 2019-01-19: qty 1

## 2019-01-19 MED ORDER — ROCURONIUM BROMIDE 10 MG/ML (PF) SYRINGE
50.0000 mg | PREFILLED_SYRINGE | Freq: Once | INTRAVENOUS | Status: AC
  Administered 2019-01-19: 11:00:00 50 mg via INTRAVENOUS

## 2019-01-19 MED ORDER — LEVOFLOXACIN IN D5W 750 MG/150ML IV SOLN
750.0000 mg | INTRAVENOUS | Status: DC
  Administered 2019-01-19 – 2019-01-23 (×5): 750 mg via INTRAVENOUS
  Filled 2019-01-19 (×5): qty 150

## 2019-01-19 MED ORDER — LORAZEPAM 2 MG/ML IJ SOLN
1.0000 mg | Freq: Once | INTRAMUSCULAR | Status: AC
  Administered 2019-01-19: 07:00:00 1 mg via INTRAVENOUS

## 2019-01-19 MED ORDER — DEXMEDETOMIDINE HCL IN NACL 400 MCG/100ML IV SOLN
0.0000 ug/kg/h | INTRAVENOUS | Status: DC
  Administered 2019-01-19: 21:00:00 0.4 ug/kg/h via INTRAVENOUS
  Administered 2019-01-20: 02:00:00 0.7 ug/kg/h via INTRAVENOUS
  Administered 2019-01-21: 03:00:00 0.5 ug/kg/h via INTRAVENOUS
  Filled 2019-01-19 (×4): qty 100

## 2019-01-19 MED ORDER — FENTANYL CITRATE (PF) 100 MCG/2ML IJ SOLN
25.0000 ug | INTRAMUSCULAR | Status: DC | PRN
  Administered 2019-01-20: 100 ug via INTRAVENOUS
  Filled 2019-01-19 (×2): qty 2

## 2019-01-19 MED ORDER — MAGNESIUM SULFATE 2 GM/50ML IV SOLN
2.0000 g | Freq: Once | INTRAVENOUS | Status: AC
  Administered 2019-01-19: 2 g via INTRAVENOUS
  Filled 2019-01-19: qty 50

## 2019-01-19 MED ORDER — MORPHINE SULFATE (PF) 2 MG/ML IV SOLN
2.0000 mg | INTRAVENOUS | Status: DC | PRN
  Administered 2019-01-19: 2 mg via INTRAVENOUS
  Filled 2019-01-19: qty 1

## 2019-01-19 MED ORDER — CHLORHEXIDINE GLUCONATE 0.12% ORAL RINSE (MEDLINE KIT)
15.0000 mL | Freq: Two times a day (BID) | OROMUCOSAL | Status: DC
  Administered 2019-01-19 – 2019-01-21 (×4): 15 mL via OROMUCOSAL

## 2019-01-19 NOTE — Progress Notes (Signed)
PROGRESS NOTE    Frederick Moody  ZYS:[id number] DOB: Jul 26, 1955 DOA: 01/07/2019 PCP: Patient, No Pcp Per    Brief Narrative:  63 year old male who presented with weakness and falls.  He does have significant past medical history for uncontrolled diabetes mellitus.  He reported weakness for about 3 days, generalized and worsening, associated with lightheadedness and dizziness.  About a week ago he noticed an upper back nodule which self drained purulent material.  On his initial physical examination he was febrile 38.4, tachycardic 130 bpm, blood pressure 150/90, respiratory rate 18, oxygen saturation 99%.  Moist mucous membranes, his lungs were clear to auscultation bilaterally, heart S1-2 present tachycardic, abdomen was soft collar extremity edema.  Upper thoracic 5 cm diameter ulcerated wound with purulent drainage.  Sodium 123, potassium 5.3, chloride 83, bicarb 19, glucose 684, BUN 24, creatinine 1.27, anion gap 21, white count 24.5, hemoglobin 14.5, hematocrit 41.0, platelets 277, SARS COVID-19 was negative.  Urinalysis more than 500 glucose, 0-5 white cells.  His blood cultures grew MSSA.  His chest x-ray was negative for infiltrates.  EKG 132 bpm, left axis deviation, normal intervals, sinus rhythm, J-point elevation V1 through V3, no significant T wave changes.  Patient was admitted with a working diagnosis of diabetes ketoacidosis, complicated with sepsis due to MSSA bacteremia (present on admission).  Patient developed severe alcohol withdrawal, received high doses of benzodiazepines to control his symptoms and required intubation to protect his airway.  January 11, 2019 intubation.   Patient was diagnosed with a large dorsal cervical thoracic abscess 11 x 5 x 6 cm from the skin into the superficial musculature.  Extension to spinous process without bony erosion.  He underwent or abscess drain July 20.   July 22 he was found to have extensive muscle subcutaneous edema consistent with  cellulitis and myositis in his left upper extremity per MRI.  Patient was liberated from mechanical ventilation July 25.  Transferred to [facility] July 26.     Assessment & Plan:   Principal Problem:   MSSA bacteremia Active Problems:   DKA (diabetic ketoacidoses) ([place])   Alcohol withdrawal (HCC)   Aspiration pneumonia ([place])   Sepsis ([place])   Acute encephalopathy   Acute respiratory failure (HCC)   Malnutrition of moderate degree   Hypokalemia   Pressure injury of skin   1. Acute toxic metabolic encephalopathy, present on admission, with features of delirium/ alcohol withdrawal syndrome/ complicated with acute hypoxic respiratory failure.  Patient received lorazepam this am for agitation (1 mg), at 6:45 am. Now patient less reactive, respiratory rate in the 40's and heart rate in the 120's. Able to respond to simple questions, but hyporeactive.   Possible pain component to his distress, will order morphine 1 mg IV q 2H per for pain control, hold for now on tube feedings and will check chest film, continue neuro checks and aspiration precautions.   Continue physical restrains for safety.   2. Sepsis due to MSSA bacteremia, present on admission. Dorsal cervico thoracic abscess, left upper extremity cellulitis, myositis. Patient continue to have fever, this am 101.1. On ancef for antibiotic therapy. Positive rash on right upper extremity.Follow on cell count in am.   3. T2DM with DKA on admission, uncontrolled hyperglycemia. Capillary glucose 267, 263, 331, 271, 249.Will continue glucose cover with insulin sliding scale, basal insulin with 20 units of levemir.   3. Hypokalemia. Persistent hyperkalemia, will continue K correction with Kcl 40 meq x2 doses, and follow on renal panel in am, renal function  with serum cr at 0,92 and serum bicarbonate at 27.   4. Calorie protein malnutrition/ moderate. Hold tube feedings while in respiratory distress.    5. Sacrum pressure ulcer not  present on admission, not able to stage, With local wound care.   6. Aspiration pneumonia (not present on admission). Patient has completed course of Unasyn. Continue aspiration precautions.   DVT prophylaxis: enoxaparin   Code Status: full Family Communication: no family at the bedside  Disposition Plan/ discharge barriers: pending clinical improvement.    Body mass index is 28.75 kg/m. Malnutrition Type:  Nutrition Problem: Moderate Malnutrition Etiology: chronic illness(poorly controlled DM, EtOH abuse, abscess)   Malnutrition Characteristics:  Signs/Symptoms: percent weight loss, mild muscle depletion   Nutrition Interventions:  Interventions: Tube feeding  RN Pressure Injury Documentation: Pressure Injury 01/15/19 Sacrum Medial Deep Tissue Injury - Purple or maroon localized area of discolored intact skin or blood-filled blister due to damage of underlying soft tissue from pressure and/or shear. (Active)  01/15/19 2100  Location: Sacrum  Location Orientation: Medial  Staging: Deep Tissue Injury - Purple or maroon localized area of discolored intact skin or blood-filled blister due to damage of underlying soft tissue from pressure and/or shear.  Wound Description (Comments):   Present on Admission: No     Consultants:     Procedures:     Antimicrobials:   Cefazolin     Subjective: Patient is distress with respiratory rate in the 40's and tachycardic, able to respond to simple questions, not much interactive. Agitated early this am, required lorazepam IV extra dose.   Objective: Vitals:   01/19/19 0600 01/19/19 0637 01/19/19 0745 01/19/19 0748  BP: (!) 156/77     Pulse: (!) 117   (!) 119  Resp: (!) 36     Temp:  99.1 F (37.3 C) (!) 101.1 F (38.4 C)   TempSrc:  Oral Axillary   SpO2: 100%     Weight:      Height:        Intake/Output Summary (Last 24 hours) at 01/19/2019 0814 Last data filed at 01/19/2019 0600 Gross per 24 hour  Intake  2912.22 ml  Output 1470 ml  Net 1442.22 ml   Filed Weights   01/17/19 0500 01/18/19 0500 01/19/19 0500  Weight: 88.2 kg 88.2 kg 88.3 kg    Examination:   General: deconditioned and ill looking appearing, in distress.  Neurology: Awake. Responds to simple questions, disorientated.  E ZOX:WRUE pallor, no icterus, oral mucosa moist, NG tube in place.  Cardiovascular: No JVD. S1-S2 present, rhythmic, no gallops, rubs, or murmurs. ++/+++ pitting upper and  lower extremity edema/ dependent. Pulmonary: positive breath sounds bilaterally,no wheezing, rhonchi or rales. Decreased breath sounds at bases.  Gastrointestinal. Abdomen distended no organomegaly, non tender, no rebound or guarding Skin. Left upper extremity rash, ill defined borders, upper cervico thoracic wound with dressing in place.  Musculoskeletal: no joint deformities     Data Reviewed: I have personally reviewed following labs and imaging studies  CBC: Recent Labs  Lab 01/14/19 0446 01/15/19 0509 01/16/19 0457 01/17/19 0424 01/18/19 0321  WBC 11.9* 9.2 8.1 7.0 6.7  NEUTROABS 10.8* 7.7 7.3 5.7 5.6  HGB 10.6* 9.0* 9.6* 9.0* 8.4*  HCT 32.0* 27.3* 28.7* 27.4* 25.9*  MCV 97.3 98.9 98.3 98.6 98.9  PLT 274 236 220 217 454   Basic Metabolic Panel: Recent Labs  Lab 01/13/19 0434 01/14/19 0446 01/15/19 0509 01/16/19 0800 01/16/19 1700 01/17/19 0424 01/17/19 1951 01/18/19 0321 01/19/19 0403  NA 136 138 140 141 146* 147* 148* 147* 148*  K 3.7 4.2 3.7 6.5* 3.0* 2.8* 3.4* 3.9 3.1*  CL 109 112* 110 112* 111 109 110 109 112*  CO2 19* 19* _0 GLUCOSE 167* 253* 177* 167* 184* 51* 138* 182* 306*  BUN 13 22 32* 32* 26* _1 25*  CREATININE 0.76 0.81 0.91 0.82 0.86 0.79 1.00 1.10 0.92  CALCIUM 6.5* 6.8* 7.1* 7.3* 8.0* 8.1* 7.8* 7.6* 7.8*  MG 1.9 2.0 2.1 1.8  --   --   --  1.7  --   PHOS 3.3 3.3 3.0 3.8  --   --   --  5.8*  --    GFR: Estimated Creatinine Clearance: 90.3 mL/min (by C-G formula  based on SCr of 0.92 mg/dL). Liver Function Tests: Recent Labs  Lab 01/14/19 0446 01/15/19 0509 01/16/19 0800 01/17/19 0424 01/18/19 0321  AST 42* 51* 83* 50* 57*  ALT 34 30 41 31 32  ALKPHOS 100 111 120 101 89  BILITOT 1.4* 0.9 2.3* 0.9 1.2  PROT 5.6* 5.3* 5.5* 6.0* 6.2*  ALBUMIN 1.1* 1.1* 1.3* 1.5* 1.6*   No results for input(s): LIPASE, AMYLASE in the last 168 hours. No results for input(s): AMMONIA in the last 168 hours. Coagulation Profile: Recent Labs  Lab 01/13/19 0434  INR 1.5*   Cardiac Enzymes: Recent Labs  Lab 01/15/19 1249  CKTOTAL 62   BNP (last 3 results) No results for input(s): PROBNP in the last 8760 hours. HbA1C: No results for input(s): HGBA1C in the last 72 hours. CBG: Recent Labs  Lab 01/18/19 1528 01/18/19 1942 01/18/19 2339 01/19/19 0406 01/19/19 0748  GLUCAP 267* 263* 331* 271* 249*   Lipid Profile: No results for input(s): CHOL, HDL, LDLCALC, TRIG, CHOLHDL, LDLDIRECT in the last 72 hours. Thyroid Function Tests: No results for input(s): TSH, T4TOTAL, FREET4, T3FREE, THYROIDAB in the last 72 hours. Anemia Panel: No results for input(s): VITAMINB12, FOLATE, FERRITIN, TIBC, IRON, RETICCTPCT in the last 72 hours.    Radiology Studies: I have reviewed all of the imaging during this hospital visit personally     Scheduled Meds: . chlorhexidine gluconate (MEDLINE KIT)  15 mL Mouth Rinse BID  . Chlorhexidine Gluconate Cloth  6 each Topical Q0600  . enoxaparin (LOVENOX) injection  40 mg Subcutaneous Q24H  . folic acid  1 mg Oral Daily  . free water  200 mL Per Tube Q8H  . insulin aspart  0-9 Units Subcutaneous Q4H  . insulin detemir  20 Units Subcutaneous QHS  . multivitamin with minerals  1 tablet Oral Daily  . OLANZapine  5 mg Per NG tube QHS  . potassium chloride  40 mEq Per Tube Q4H  . thiamine  100 mg Oral Daily   Continuous Infusions: . sodium chloride 10 mL/hr at 01/18/19 2300  .  ceFAZolin (ANCEF) IV 2 g (01/19/19 0506)   . famotidine (PEPCID) IV 100 mL/hr at 01/19/19 0000  . feeding supplement (VITAL AF 1.2 CAL) 1,000 mL (01/18/19 1831)     LOS: 12 days        Paysen Goza Gerome Apley, MD

## 2019-01-19 NOTE — Progress Notes (Signed)
NAME:  Frederick Moody, MRN:  [id number], DOB:  06/03/1956, LOS: 12 ADMISSION DATE:  01/07/2019, CONSULTATION DATE:  01/11/19 CHIEF COMPLAINT:  Encephaolpathy   Brief History   This is a 63 yo with history of uncontrolled DM and etoh abuse who presents for weakness and falls. Found to have etoh withdrawal and DKA (resolved) and MSSA bacteremia likely source is abscess on upper back. Patient was admitted to CCM after being unable to protect his airway, thought to be due to excess benzodiazepenes with his withdrawal.   Past Medical History  Poorly controlled DM, ETOH abuse  Principal Problem:   MSSA bacteremia Active Problems:   DKA (diabetic ketoacidoses) ([place])   Alcohol withdrawal ([place])   Aspiration pneumonia ([place])   Sepsis ([place])   Acute encephalopathy   Acute respiratory failure ([place])   Malnutrition of moderate degree   Hypokalemia   Pressure injury of skin   Significant Hospital Events   01/07/19 - admitted for MSSA positive  01/11/19 - intubated and transferred to MICU 01/12/19 - OR for I & D of upper back   Consults:  ID  Surgery  Nutrition  Cardiology   Procedures:  01/11/2019-intubation 01/11/19 - Central Line 7/20 > OR for abscess drain  7/20 : I/O, foley placement   Significant Diagnostic Tests:  7/20 MRI Elbow >> severe myositis, small fluid collection, subcutaneous swelling  7/20 Ab XR Gastric tube courses into the stomach with its tip in the antropyloric region, appropriately positioned.  Ct Head Wo Contrast 7/20 CT Head w/o - no acute findings, remote infarcts, chronic axillary sinusitis 7/20 CT chest W/O-moderate layering pleural effusions with multisegmental atelectasis.  Dorsal cervical thoracic soft tissue inflammation 7/20/CT spine 11 x 5 x 6 cm abscess in the dorsal cervicothoracic soft tissues extending from the skin surface into the superficial musculature. There is extension to spinous processes without bony erosion.  7/17 TTE -normal systolic  function with EF 55 to 60%.  Left atrium mildly dilated.  Mild mitral annular calcification present.  No stenosis of aortic valve but with mild thickening and calcification.  7/17 UE U/S Focal area of soft tissue edema with surrounding Doppler flow in the medial aspect of left elbow which appears to be centered in the flexor musculature concerning for myositis. If there is further clinical concern regarding septic arthritis, recommend an MRI of the left elbow. .   7/22 left upper extremity MRI -forearm: Extensive muscle and subcutaneous edema visualized throughout the left arm consistent with cellulitis and myositis.  Humerus: Inflammation spreads to shoulder girdle dural and left upper arm and left lateral chest consistent with severe myositis.  Micro Data:  C. difficile PCR negative MRSA negative 01/07/19 - blood culture >> + MSSA (Resistant to cipro, clinda, erythro) 01/07/19 - Urine cx >> + staph aureus pan senstive (Resistant to cipro)  01/09/19 - Blood culture >> NG 01/10/19 - Urine Culture >> NG 01/12/19 - Blood culture >> NG  01/12/19 - Abscess culture >> staph aureus resistant to Cipro 01/12/19 -trach aspirate: Stenotrophomonas final  Antimicrobials:  7/20 Unasyn >>  7/16-17, 7/20 Vanc 7/16> CTX 7/16 > Ancef 7/15-7/16> Cefepime  Interim history/subjective:   Severely altered overnight and unresponsive this AM  Objective   Temp:  [98.7 F (37.1 C)-101.1 F (38.4 C)] 101.1 F (38.4 C) (07/27 0745) Pulse Rate:  [90-141] 110 (07/27 1100) Resp:  [21-42] 35 (07/27 1100) BP: (123-173)/(57-100) 123/64 (07/27 1100) SpO2:  [94 %-100 %] 97 % (07/27 1100) FiO2 (%):  [28 %-  100 %] 100 % (07/27 1120) Weight:  [85.7 kg-88.3 kg] 85.7 kg (07/27 1046)     Vent Mode: PRVC FiO2 (%):  [28 %-100 %] 100 % Set Rate:  [15 bmp] 15 bmp Vt Set:  [560 mL] 560 mL PEEP:  [5 cmH20] 5 cmH20  Intake/Output      07/26 0701 - 07/27 0700 07/27 0701 - 07/28 0700   I.V. (mL/kg) 291.5 (3.3)    NG/GT 2265  476.3   IV Piggyback 449.9 50.1   Total Intake(mL/kg) 3006.4 (34) 526.3 (6.1)   Urine (mL/kg/hr) 870 (0.4) 140 (0.3)   Emesis/NG output 0    Stool 600    Total Output 1470 140   Net +1536.4 +386.3           Filed Weights   01/18/19 0500 01/19/19 0500 01/19/19 1046  Weight: 88.2 kg 88.3 kg 85.7 kg   Examination: General: Obtunded and in severe respiratory distress Lungs: Coarse BS diffusely HEENT: [place]/AT, PERRL, EOM-I and MMM, ETT in place now Pulmonary: Coarse BS diffusely Cardiovascular: RRR, Nl S1/S2 and -M/R/G Abdomen: Soft, NT, ND and +BS Extremities: -edema and -tenderness Neuro:  Confused but awake and oriented to self GU: Foley cath in place  I reviewed CXR myself, ETT is in a good position  Assessment & Plan:  MSSA Bacteremia likely due to soft tissue abscess on upper back, s/p drainage in the OR. TEE will not be obtained at this time per cardiology. Per ID, patient will need long course of ancef as patient is not getting TEE. White count is stable. No fevers. Continue on unasyn for steno on trach aspirate x 7 days followed by ancef.  - Continue Unasyn, (7/20, day 7) but will continue for now given deterioration overnight - Blood culture if fever   Acute hypoxic respiratory failure Extubated and stable on nasal canula. Will continue to wean precedex. Patient remains confused. Will watch respiratory status in ICU and likely d/c to floor this weekend. Continue with lasix 80mg  q 8 hours for volume status and pulmonary edema.  - Lasix and zaroxolyn as ordered - Titrate O2 for sat of 88-92%  Aspiration pneumonia presumed aspiration pneumonia given findings at intubation and continued fevers.  No consolidation appreciated on chest x-ray previously patient currently on Unasyn for steno. - swallow study pending   Confusion and hypernatremia Likely ICU delirium and medications.  - PT/OT - D/C foley if able - OOB - Free water 350 q4  Severe Myositis & cellulitis of LUE   Orthopedic surgery consulted yesterday and recommend supportive measures with no drainable fluid at this time.  They have signed off.  CK was obtained and 62 - Monitor   ETOH  Out of the window for alcohol withdrawal.  Diabetes  A1c 11.7.  CBGs in the mid 200s. Currently on detemir 20 units nightly with sliding scale every 4 hours.   Anemia stable at baseline, 10.5 Lovenox added back today   Nutrition nutrition on board.   D/c'ed tube feeds. Hold PO medications until swallow study  - swallow study   Electrolytes within normal limits. Lab draws for K 3x this morning. Two were hemolyzed, anothyer 3.5. EKG wnl. Will recheck at 1700 today.  Daily BMP w/ Mg and Phos Free water as bove Replace Recheck  PCCM will assume care  Best practice:  Diet: NPO Pain/Anxiety/Delirium protocol (if indicated): haloperidol lactate, ibuprofen, lip balm, LORazepam, morphine injection, polyvinyl alcohol  VAP protocol (if indicated): No DVT prophylaxis: LOVENOX GI prophylaxis:  PTX Glucose control: detemir and ssI  Mobility: BR Code Status: Full  Family Communication: Sister daily  [facility]  The patient is critically ill with multiple organ systems failure and requires high complexity decision making for assessment and support, frequent evaluation and titration of therapies, application of advanced monitoring technologies and extensive interpretation of multiple databases.   Critical Care Time devoted to patient care services described in this note is  32  Minutes. This time reflects time of care of this signee Dr Koren BoundWesam Kimble Delaurentis. This critical care time does not reflect procedure time, or teaching time or supervisory time of PA/NP/Med student/Med Resident etc but could involve care discussion time.  Alyson ReedyWesam G. Tyrina Hines, M.D. [facility]. Pager: [phone]. After hours pager: [phone].

## 2019-01-19 NOTE — Progress Notes (Signed)
OT Cancellation Note  Patient Details Name: Frederick Moody MRN: [id number] DOB: [birth year]   Cancelled Treatment:    Reason Eval/Treat Not Completed: Medical issues which prohibited therapy; noted pt recently re-intubated. Will hold OT eval and follow up as able.  Lou Cal, OT [facility] Pager [phone] Office [phone]   Raymondo Band 01/19/2019, 1:57 PM

## 2019-01-19 NOTE — Progress Notes (Signed)
eLink Physician-Brief Progress Note Patient Name: Frederick Moody DOB: 23-Apr-1956 MRN: [id number]   Date of Service  01/19/2019  HPI/Events of Note  Notified of shaking movements. Afebrile. K 3.1  eICU Interventions   Ativan 1 mg IV ordered  K 40 meqs per NG q 4 x 2 doses     Intervention Category Major Interventions: Seizures - evaluation and management  Shona Needles Alanta Scobey 01/19/2019, 6:42 AM

## 2019-01-19 NOTE — Progress Notes (Signed)
6 Days Post-Op  Subjective: CC: Chart reviewed from the weekend. Febrile overnight.   Objective: Vital signs in last 24 hours: Temp:  [98.7 F (37.1 C)-101.1 F (38.4 C)] 101.1 F (38.4 C) (07/27 0745) Pulse Rate:  [74-141] 115 (07/27 1000) Resp:  [21-42] 29 (07/27 1000) BP: (125-173)/(57-100) 131/57 (07/27 1000) SpO2:  [94 %-100 %] 94 % (07/27 1000) FiO2 (%):  [28 %] 28 % (07/27 0800) Weight:  [88.3 kg] 88.3 kg (07/27 0500) Last BM Date: 01/19/19  Intake/Output from previous day: 07/26 0701 - 07/27 0700 In: 3006.4 [I.V.:291.5; NU/UV:2536G/GT:2265; IV Piggyback:449.9] Out: 1470 [Urine:870; Stool:600] Intake/Output this shift: Total I/O In: 376.3 [NG/GT:376.3] Out: 110 [Urine:110]  PE: Skin: posterior neck/bakc wound appears clean and is almost all granulation tissue.  No evidence of further infection or purulent drainage.   Lab Results:  Recent Labs    01/17/19 0424 01/18/19 0321 01/19/19 1001  WBC 7.0 6.7  --   HGB 9.0* 8.4* 9.5*  HCT 27.4* 25.9* 28.0*  PLT 217 195  --    BMET Recent Labs    01/18/19 0321 01/19/19 0403 01/19/19 1001  NA 147* 148* 154*  K 3.9 3.1* 3.6  CL 109 112*  --   CO2 27 27  --   GLUCOSE 182* 306*  --   BUN 22 25*  --   CREATININE 1.10 0.92  --   CALCIUM 7.6* 7.8*  --    PT/INR No results for input(s): LABPROT, INR in the last 72 hours. CMP     Component Value Date/Time   NA 154 (H) 01/19/2019 1001   K 3.6 01/19/2019 1001   CL 112 (H) 01/19/2019 0403   CO2 27 01/19/2019 0403   GLUCOSE 306 (H) 01/19/2019 0403   BUN 25 (H) 01/19/2019 0403   CREATININE 0.92 01/19/2019 0403   CALCIUM 7.8 (L) 01/19/2019 0403   PROT 6.2 (L) 01/18/2019 0321   ALBUMIN 1.6 (L) 01/18/2019 0321   AST 57 (H) 01/18/2019 0321   ALT 32 01/18/2019 0321   ALKPHOS 89 01/18/2019 0321   BILITOT 1.2 01/18/2019 0321   GFRNONAA >60 01/19/2019 0403   GFRAA >60 01/19/2019 0403   Lipase  No results found for: LIPASE     Studies/Results: Dg Abd 1 View   Result Date: 01/17/2019 CLINICAL DATA:  Encounter for NG tube advancement. EXAM: ABDOMEN - 1 VIEW COMPARISON:  01/17/2019 FINDINGS: Nasogastric tube has been advanced, tip overlying the level of the proximal to mid stomach. Side port now overlies the level of the stomach. Nonobstructive bowel gas pattern. IMPRESSION: Nasogastric tube tip to mid stomach. Electronically Signed   By: Norva PavlovElizabeth  Brown M.D.   On: 01/17/2019 19:43   Dg Abd 1 View  Result Date: 01/17/2019 CLINICAL DATA:  Encounter for NG tube placement. EXAM: ABDOMEN - 1 VIEW COMPARISON:  01/15/2019 FINDINGS: Nasogastric tube is in place, tip overlying the level of the proximal stomach. Consider advancing to 10 centimeters into the mid stomach. There has been significant decompression of the stomach since the previous study. No evidence for bowel obstruction or free intraperitoneal air. IMPRESSION: Improved gastric distention.  Nonobstructive bowel gas pattern. Consider advancing nasogastric tube further into the stomach by 10 centimeters. Electronically Signed   By: Norva PavlovElizabeth  Brown M.D.   On: 01/17/2019 12:11   Dg Chest Port 1 View  Result Date: 01/19/2019 CLINICAL DATA:  Patient admitted 01/07/2019 in diabetic ketoacidosis. The patient subsequently underwent drainage of an abscess on the back 01/12/2019. EXAM: PORTABLE  CHEST 1 VIEW COMPARISON:  Single-view of the chest 01/12/2019. FINDINGS: Right IJ catheter and NG tube remain in place. Endotracheal tube has been removed. Right greater than left effusions and airspace disease have worsened. No pneumothorax. Heart size is upper normal. Aortic atherosclerosis noted. IMPRESSION: Status post extubation. Right IJ catheter and NG tube remain in place. Worsened right greater than left pleural effusions and airspace disease which could be due to atelectasis or pneumonia. Electronically Signed   By: Inge Rise M.D.   On: 01/19/2019 09:55    Anti-infectives: Anti-infectives (From admission,  onward)   Start     Dose/Rate Route Frequency Ordered Stop   01/19/19 0600  ceFAZolin (ANCEF) IVPB 2g/100 mL premix     2 g 200 mL/hr over 30 Minutes Intravenous Every 8 hours 01/17/19 1028 02/22/19 2359   01/12/19 1400  Ampicillin-Sulbactam (UNASYN) 3 g in sodium chloride 0.9 % 100 mL IVPB     3 g 200 mL/hr over 30 Minutes Intravenous Every 6 hours 01/12/19 1101 01/18/19 2359   01/12/19 1000  vancomycin (VANCOCIN) 1,250 mg in sodium chloride 0.9 % 250 mL IVPB  Status:  Discontinued     1,250 mg 166.7 mL/hr over 90 Minutes Intravenous Every 12 hours 01/12/19 0115 01/12/19 1101   01/12/19 0115  vancomycin (VANCOCIN) 1,500 mg in sodium chloride 0.9 % 500 mL IVPB     1,500 mg 250 mL/hr over 120 Minutes Intravenous  Once 01/12/19 0100 01/12/19 0514   01/10/19 1230  meropenem (MERREM) 1 g in sodium chloride 0.9 % 100 mL IVPB  Status:  Discontinued     1 g 200 mL/hr over 30 Minutes Intravenous Every 8 hours 01/10/19 1146 01/12/19 1101   01/08/19 2200  ceFAZolin (ANCEF) IVPB 2g/100 mL premix  Status:  Discontinued     2 g 200 mL/hr over 30 Minutes Intravenous Every 8 hours 01/08/19 1030 01/10/19 1146   01/08/19 1800  vancomycin (VANCOCIN) 1,500 mg in sodium chloride 0.9 % 500 mL IVPB  Status:  Discontinued     1,500 mg 250 mL/hr over 120 Minutes Intravenous Every 24 hours 01/07/19 1830 01/08/19 1030   01/08/19 0630  cefTRIAXone (ROCEPHIN) 2 g in sodium chloride 0.9 % 100 mL IVPB  Status:  Discontinued     2 g 200 mL/hr over 30 Minutes Intravenous Daily 01/08/19 0536 01/08/19 1030   01/08/19 0600  ceFEPIme (MAXIPIME) 2 g in sodium chloride 0.9 % 100 mL IVPB  Status:  Discontinued     2 g 200 mL/hr over 30 Minutes Intravenous Every 12 hours 01/07/19 1830 01/07/19 2132   01/07/19 1745  vancomycin (VANCOCIN) 2,000 mg in sodium chloride 0.9 % 500 mL IVPB     2,000 mg 250 mL/hr over 120 Minutes Intravenous  Once 01/07/19 1738 01/07/19 2013   01/07/19 1745  ceFEPIme (MAXIPIME) 2 g in sodium  chloride 0.9 % 100 mL IVPB     2 g 200 mL/hr over 30 Minutes Intravenous  Once 01/07/19 1738 01/07/19 1904       Assessment/Plan DKA (diabetic ketoacidoses) (HCC) Alcohol withdrawal (HCC) Aspiration pneumonia (HCC) Sepsis ([place]) Acute encephalopathy Acute respiratory failure ([place])  Sepsis POD6, s/p I&D ofPosterior neck/upper back abscess - 7/21 -NS WD dressing changes to woundBID -cont abx therapy -no further plans for debridement at this time -Cxshowing staph aureus, abx per ID. They plan for Ancef x 6 weeks  -will be prn over WE, call if needed during that time  FEN -TFs VTE -SCDs ID -Ancef  LOS: 12 days    Jacinto HalimMichael M Meggan Dhaliwal , [facility] 01/19/2019, 10:42 AM Pager: [phone]

## 2019-01-19 NOTE — Progress Notes (Signed)
Wasted 2mg  ativan in the waste container with Loma Newton, RN

## 2019-01-19 NOTE — Progress Notes (Signed)
Inpatient Diabetes Program Recommendations  AACE/ADA: New Consensus Statement on Inpatient Glycemic Control (2015)  Target Ranges:  Prepandial:   less than 140 mg/dL      Peak postprandial:   less than 180 mg/dL (1-2 hours)      Critically ill patients:  140 - 180 mg/dL   Lab Results  Component Value Date   GLUCAP 240 (H) 01/19/2019   HGBA1C 11.7 (H) 01/07/2019    Review of Glycemic Control Results for Frederick Moody, Frederick Moody (MRN [id number]) as of 01/19/2019 15:11  Ref. Range 01/19/2019 04:06 01/19/2019 07:48 01/19/2019 11:57  Glucose-Capillary Latest Ref Range: 70 - 99 mg/dL 271 (H) 249 (H) 240 (H)   Diabetes history: DM 2 Outpatient Diabetes medications: Metformin- patient not taking Current orders for Inpatient glycemic control:  Levemir 20 units q HS, Novolog corrention- Sensitive q 4 hours  Inpatient Diabetes Program Recommendations:    Note that blood sugars>goal.  Consider changing Levemir to 14 units bid.  If blood sugars remain > goal, may also need tube feed coverage? Will follow.  Thanks,  Adah Perl, RN, BC-ADM Inpatient Diabetes Coordinator Pager [phone] (8a-5p)

## 2019-01-19 NOTE — Procedures (Signed)
Intubation Procedure Note Frederick Moody [id number] 11-Dec-1955  Procedure: Intubation Indications: Respiratory insufficiency  Procedure Details Consent: Risks of procedure as well as the alternatives and risks of each were explained to the (patient/caregiver).  Consent for procedure obtained. Time Out: Verified patient identification, verified procedure, site/side was marked, verified correct patient position, special equipment/implants available, medications/allergies/relevent history reviewed, required imaging and test results available.  Performed  Maximum sterile technique was used including antiseptics, gloves, hand hygiene and mask.  MAC    Evaluation Hemodynamic Status: BP stable throughout; O2 sats: stable throughout Patient's Current Condition: stable Complications: No apparent complications Patient did tolerate procedure well. Chest X-ray ordered to verify placement.  CXR: pending.   Jennet Maduro 01/19/2019

## 2019-01-19 NOTE — Progress Notes (Signed)
Elink notified of pt noticeably shaking. VSS. Afebrile  Not time for prn ativan so a one time dose was written and K+ is being replaced. Will continue to monitor.

## 2019-01-19 NOTE — Progress Notes (Signed)
Called by bedside RN to trouble shoot while attempting to reach MD. Reported pt more lethargic and tachypneic. RR 36-40/min. SR 125 Maintaining good sats 99% on 3Lventimask. He is on CIWA protocol and has had Ativan1mg  at 0647.  We discussed this could be related to elevated CO2 d/t sedation. Tachypnea being pt response to blow off CO2. Suggested ABG and possibly BIPAP depending on result. She is trying to reach hospitalist again and noted on recheck that Charge RN is in room discussing pt condition

## 2019-01-19 NOTE — Progress Notes (Signed)
Inpatient Rehabilitation Admissions Coordinator  Noted reintubation. I will follow.  Danne Baxter, RN, MSN Rehab Admissions Coordinator [phone] 01/19/2019 3:51 PM

## 2019-01-19 NOTE — Progress Notes (Signed)
[facility] for Infectious Disease   Reason for visit: Follow up on bacteremia  Interval History: now intubated again with increased respiratory distress.  Stenotrophomonas in previous trach culture.  CXR with increased effusion and airspace disease.    Physical Exam: Constitutional:  Vitals:   01/19/19 0930 01/19/19 1000  BP: (!) 146/63 (!) 131/57  Pulse: (!) 117 (!) 115  Resp: (!) 36 (!) 29  Temp:    SpO2: 97% 94%   patient now sedated Eyes: anicteric HENT:+ET Respiratory: respiratory effort on vent; some rhonchi Cardiovascular: RRR GI: soft, nt, nd  Review of Systems: Unable to be assessed due to patient factors  Lab Results  Component Value Date   WBC 6.7 01/18/2019   HGB 9.5 (L) 01/19/2019   HCT 28.0 (L) 01/19/2019   MCV 98.9 01/18/2019   PLT 195 01/18/2019    Lab Results  Component Value Date   CREATININE 0.92 01/19/2019   BUN 25 (H) 01/19/2019   NA 154 (H) 01/19/2019   K 3.6 01/19/2019   CL 112 (H) 01/19/2019   CO2 27 01/19/2019    Lab Results  Component Value Date   ALT 32 01/18/2019   AST 57 (H) 01/18/2019   ALKPHOS 89 01/18/2019     Microbiology: Recent Results (from the past 240 hour(s))  Culture, Urine     Status: None   Collection Time: 01/10/19 11:24 AM   Specimen: Urine, Random  Result Value Ref Range Status   Specimen Description URINE, RANDOM  Final   Special Requests NONE  Final   Culture   Final    NO GROWTH Performed at [facility], [address] [place], [place] [address]    Report Status 01/11/2019 FINAL  Final  C difficile quick scan w PCR reflex     Status: None   Collection Time: 01/11/19  3:44 PM   Specimen: STOOL  Result Value Ref Range Status   C Diff antigen NEGATIVE NEGATIVE Final   C Diff toxin NEGATIVE NEGATIVE Final   C Diff interpretation No C. difficile detected.  Final    Comment: Performed at [facility], [place] [address] [place], [place] [address]  MRSA PCR Screening     Status: None    Collection Time: 01/11/19 11:42 PM   Specimen: Nasal Mucosa; Nasopharyngeal  Result Value Ref Range Status   MRSA by PCR NEGATIVE NEGATIVE Final    Comment:        The GeneXpert MRSA Assay (FDA approved for NASAL specimens only), is one component of a comprehensive MRSA colonization surveillance program. It is not intended to diagnose MRSA infection nor to guide or monitor treatment for MRSA infections. Performed at [facility], [place] [address] [place], [place] [address]   Culture, blood (routine x 2)     Status: None   Collection Time: 01/12/19  1:43 AM   Specimen: BLOOD LEFT HAND  Result Value Ref Range Status   Specimen Description BLOOD LEFT HAND  Final   Special Requests   Final    BOTTLES DRAWN AEROBIC ONLY Blood Culture results may not be optimal due to an inadequate volume of blood received in culture bottles   Culture   Final    NO GROWTH 5 DAYS Performed at [facility], [place] [address] [place], [place] [address]    Report Status 01/17/2019 FINAL  Final  Culture, blood (routine x 2)     Status: None   Collection  Time: 01/12/19  1:50 AM   Specimen: BLOOD RIGHT HAND  Result Value Ref Range Status   Specimen Description BLOOD RIGHT HAND  Final   Special Requests   Final    BOTTLES DRAWN AEROBIC ONLY Blood Culture adequate volume   Culture   Final    NO GROWTH 5 DAYS Performed at [facility], [address] [place], [place] [address]    Report Status 01/17/2019 FINAL  Final  Culture, respiratory (non-expectorated)     Status: None   Collection Time: 01/12/19 12:47 PM   Specimen: Tracheal Aspirate; Respiratory  Result Value Ref Range Status   Specimen Description TRACHEAL ASPIRATE  Final   Special Requests NONE  Final   Gram Stain   Final    RARE WBC PRESENT, PREDOMINANTLY PMN NO ORGANISMS SEEN Performed at [facility], [address] [place], [place] [address]    Culture FEW STENOTROPHOMONAS MALTOPHILIA  Final   Report Status  01/15/2019 FINAL  Final   Organism ID, Bacteria STENOTROPHOMONAS MALTOPHILIA  Final      Susceptibility   Stenotrophomonas maltophilia - MIC*    LEVOFLOXACIN 2 SENSITIVE Sensitive     TRIMETH/SULFA 80 RESISTANT Resistant     * FEW STENOTROPHOMONAS MALTOPHILIA  Surgical PCR screen     Status: Abnormal   Collection Time: 01/13/19  9:00 AM   Specimen: Nasal Mucosa; Nasal Swab  Result Value Ref Range Status   MRSA, PCR NEGATIVE NEGATIVE Final   Staphylococcus aureus POSITIVE (A) NEGATIVE Final    Comment: (NOTE) The Xpert SA Assay (FDA approved for NASAL specimens in patients 64 years of age and older), is one component of a comprehensive surveillance program. It is not intended to diagnose infection nor to guide or monitor treatment. Performed at [facility], [address] [place], [place] [address]   Aerobic/Anaerobic Culture (surgical/deep wound)     Status: None   Collection Time: 01/13/19 12:37 PM   Specimen: Abscess  Result Value Ref Range Status   Specimen Description ABSCESS UPPER BACK  Final   Special Requests NONE  Final   Gram Stain NO WBC SEEN FEW GRAM POSITIVE COCCI IN PAIRS   Final   Culture   Final    MODERATE STAPHYLOCOCCUS AUREUS NO ANAEROBES ISOLATED Performed at [facility], [address] [place], [place] [address]    Report Status 01/18/2019 FINAL  Final   Organism ID, Bacteria STAPHYLOCOCCUS AUREUS  Final      Susceptibility   Staphylococcus aureus - MIC*    CIPROFLOXACIN >=8 RESISTANT Resistant     ERYTHROMYCIN <=0.25 SENSITIVE Sensitive     GENTAMICIN <=0.5 SENSITIVE Sensitive     OXACILLIN 0.5 SENSITIVE Sensitive     TETRACYCLINE <=1 SENSITIVE Sensitive     VANCOMYCIN <=0.5 SENSITIVE Sensitive     TRIMETH/SULFA <=10 SENSITIVE Sensitive     CLINDAMYCIN <=0.25 SENSITIVE Sensitive     RIFAMPIN <=0.5 SENSITIVE Sensitive     Inducible Clindamycin NEGATIVE Sensitive     * MODERATE STAPHYLOCOCCUS AUREUS    Impression/Plan:  1. MSSA  bacteremia - on cefazolin now for this.  Will need a prolonged course.  TEE not necessarily indicated as he will need a prolonged course regardless.  2. Abscess - same growth.  Tracks down to spinous process and will treat with prolonged IV therapy as above.  3. Pneumonia - worsened pulmonary status.  I have added levaquin for broader coverage to include previously identified Stenotrophomonas.

## 2019-01-19 NOTE — Progress Notes (Signed)
SLP Cancellation Note  Patient Details Name: Frederick Moody MRN: [id number] DOB: Jan 22, 1956   Cancelled treatment:    Pt with medical issues prohibiting therapy.  Spoke with RN; will follow for readiness.  Rafan Sanders L. Tivis Ringer, [place] Office number [phone] Pager [phone]        Juan Quam Laurice 01/19/2019, 9:45 AM

## 2019-01-19 NOTE — Consult Note (Signed)
Physical Medicine and Rehabilitation Consult  Reason for Consult: Debility due to multiple medical issues and alcohol withdrawal.  Referring Physician: Dr. Ella JubileeArrien.    HPI: Frederick Moody is a 63 y.o. male with history of uncontrolled DM--no medications, alcohol abuse who was admitted on 01/04/19 with 3 day history of weakness and falls. He was found to be septic with fevers, lactic acidosis and DKA. He was started on DKA protocol and IV antibiotics. Patient with drianing ulcer on upper back and multiple abrasions on BLE. He was started on CIWA protocol for alcohol withdrawal with encephalopathy. Bood cultures drawn were positive for MSSA.  He was started on broad spectrum antibiotics but had decline in medical status with persistent fevers and somnolence requiring intubation and transfer to ICU on 07/19.  ID consulted for input on MSSA bacteremia and recommended 2 weeks of IV antibiotics as well as TEE to rule out endocarditis.  CT chest/thoracic spine showed 11 X 5 x 6 cm abscess in dorsal cervicothorcic soft tissues extending from skin to superficial musculature and spinous process without bony erosion as well as moderate pleural effusions and incidental left thyroid nodule. CT head negative for acute changes. 2D echo showed EF 55-60% with normal EF and mild thickening of mitral wall.   Dr. Corliss Skainssuei consulted and patient underwent I & D of upper back abscess on 07/21 with follow up wet to dry dressing changes. Cardiology following for input on TEE and felt that patient a high risk for peforation/spine instability due to size of abscess and to consider longer course of antibiotic regimen if endocarditis suspected.  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  Dr. Everardo PacificVarkey consulted for input and recommended supportive measures with elevation and antibiotic regimen--no surgical  needs noted.  He tolerated extubation on 7/24 and has effervesced on current regimen. He continues to have issues with agitation and confusion due to delirium requiring precedex. NPO with tube feeds due to dysphagia.  This am has had increase in lethargy with tachypnea with tachycardia question due to ativan.  Therapy evaluations completed and CIR recommended due to functional decline.    Review of Systems  Unable to perform ROS: Mental status change      Past Medical History:  Diagnosis Date   Diabetes mellitus without complication (HCC)    ETOH abuse     Past Surgical History:  Procedure Laterality Date   APPENDECTOMY     IRRIGATION AND DEBRIDEMENT ABSCESS N/A 01/13/2019   Procedure: IRRIGATION AND DEBRIDEMENT OF BACK ABSCESS;  Surgeon: Manus Ruddsuei, Matthew, MD;  Location: [facility] OR;  Service: General;  Laterality: N/A;    Family Hx: Unable to elicit due to lethargy/encephalopathy.     Social History:  Per reports that he has never smoked. He has never used smokeless tobacco. Per reports previous alcohol use. He reports that he does not use drugs.    Allergies: No Known Allergies    Medications Prior to Admission  Medication Sig Dispense Refill   naproxen sodium (ALEVE) 220 MG tablet Take 220-440 mg by mouth 2 (two) times daily as needed (for pain).     metFORMIN (GLUCOPHAGE) 500 MG tablet Take 1 tablet (500 mg total) by mouth 2 (two) times daily with a meal. (Patient not taking: Reported on 01/07/2019) 60 tablet 0    Home: Home Living Family/patient expects to be discharged to::  Private residence Living Arrangements: Other relatives Available Help at Discharge: Family, Available 24 hours/day Type of Home: Apartment Home Access: Stairs to enter [employer] of Steps: 2 Entrance Stairs-Rails: Right, Left Home Layout: One level Home Equipment: None Additional Comments: Not sure of pt reliability.  Pt with decrease focus and perseverating on need for water.    Functional History: Prior Function Level of Independence: Independent Functional Status:  Mobility: Bed Mobility Overal bed mobility: Needs Assistance Bed Mobility: Supine to Sit, Sit to Supine Supine to sit: Mod assist Sit to supine: Mod assist General bed mobility comments: cues for direction and assist of trunk and LE's Transfers Overall transfer level: Needs assistance Equipment used: None Transfers: Sit to/from Stand, Lateral/Scoot Transfers Sit to Stand: Mod assist, Max assist  Lateral/Scoot Transfers: Max assist General transfer comment: cues for technique, sit to stand x6 at EOB.  Pt submaximally upright with mildly flexed posture and R knee not fully extended.  Pt stands up better than w/shift and pivoting laterally up toward [facility] Ambulation/Gait General Gait Details: unable today    ADL:    Cognition: Cognition Overall Cognitive Status: Impaired/Different from baseline Orientation Level: Oriented to person, Disoriented to place, Disoriented to time, Disoriented to situation Cognition Arousal/Alertness: Awake/alert Behavior During Therapy: Restless, Flat affect Overall Cognitive Status: Impaired/Different from baseline Area of Impairment: Attention, Following commands, Safety/judgement Current Attention Level: Focused Following Commands: Follows one step commands with increased time, Follows one step commands inconsistently Safety/Judgement: Decreased awareness of safety   Blood pressure (!) 145/69, pulse (!) 123, temperature (!) 101.1 F (38.4 C), temperature source Axillary, resp. rate (!) 41, height 5\' 9"  (1.753 m), weight 88.3 kg, SpO2 98 %. Physical Exam  Nursing note and vitals reviewed. Constitutional: He appears well-developed and well-nourished. He appears listless. He appears ill. No distress. Face mask in place.  Feeding tube in place. Lying in bed with mouth open and difficult to arouse. BUE in mittens and wrist restraints.    Respiratory: No  stridor.  Weak wet cough with difficulty handling secretions.    Musculoskeletal:     Comments: Moderate edema with erythema proximal LUE.   Neurological: He appears listless.  Delayed responses. Did not attempt to verbalize or answer basic biographic questions. He was able to move right side better than left.   Skin: He is not diaphoretic.    Results for orders placed or performed during the hospital encounter of 01/07/19 (from the past 24 hour(s))  Glucose, capillary     Status: Abnormal   Collection Time: 01/18/19 11:37 AM  Result Value Ref Range   Glucose-Capillary 222 (H) 70 - 99 mg/dL  Glucose, capillary     Status: Abnormal   Collection Time: 01/18/19  3:28 PM  Result Value Ref Range   Glucose-Capillary 267 (H) 70 - 99 mg/dL  Glucose, capillary     Status: Abnormal   Collection Time: 01/18/19  7:42 PM  Result Value Ref Range   Glucose-Capillary 263 (H) 70 - 99 mg/dL  Glucose, capillary     Status: Abnormal   Collection Time: 01/18/19 11:39 PM  Result Value Ref Range   Glucose-Capillary 331 (H) 70 - 99 mg/dL  Basic metabolic panel     Status: Abnormal   Collection Time: 01/19/19  4:03 AM  Result Value Ref Range   Sodium 148 (H) 135 - 145 mmol/L   Potassium 3.1 (L) 3.5 - 5.1 mmol/L   Chloride 112 (H) 98 - 111 mmol/L   CO2 27 22 -  32 mmol/L   Glucose, Bld 306 (H) 70 - 99 mg/dL   BUN 25 (H) 8 - 23 mg/dL   Creatinine, Ser [id number] 0.61 - 1.24 mg/dL   Calcium 7.8 (L) 8.9 - 10.3 mg/dL   GFR calc non Af Amer >60 >60 mL/min   GFR calc Af Amer >60 >60 mL/min   Anion gap 9 5 - 15  Glucose, capillary     Status: Abnormal   Collection Time: 01/19/19  4:06 AM  Result Value Ref Range   Glucose-Capillary 271 (H) 70 - 99 mg/dL  Glucose, capillary     Status: Abnormal   Collection Time: 01/19/19  7:48 AM  Result Value Ref Range   Glucose-Capillary 249 (H) 70 - 99 mg/dL   Dg Abd 1 View  Result Date: 01/17/2019 CLINICAL DATA:  Encounter for NG tube advancement. EXAM: ABDOMEN - 1  VIEW COMPARISON:  01/17/2019 FINDINGS: Nasogastric tube has been advanced, tip overlying the level of the proximal to mid stomach. Side port now overlies the level of the stomach. Nonobstructive bowel gas pattern. IMPRESSION: Nasogastric tube tip to mid stomach. Electronically Signed   By: Norva PavlovElizabeth  Brown M.D.   On: 01/17/2019 19:43   Dg Abd 1 View  Result Date: 01/17/2019 CLINICAL DATA:  Encounter for NG tube placement. EXAM: ABDOMEN - 1 VIEW COMPARISON:  01/15/2019 FINDINGS: Nasogastric tube is in place, tip overlying the level of the proximal stomach. Consider advancing to 10 centimeters into the mid stomach. There has been significant decompression of the stomach since the previous study. No evidence for bowel obstruction or free intraperitoneal air. IMPRESSION: Improved gastric distention.  Nonobstructive bowel gas pattern. Consider advancing nasogastric tube further into the stomach by 10 centimeters. Electronically Signed   By: Norva PavlovElizabeth  Brown M.D.   On: 01/17/2019 12:11    Pt was just placed on the vent. Spoke with CCM who is in discussions with family regarding goals of care. Do not anticipate inpatient rehab needs.   Ranelle OysterZachary T. Malaquias Lenker, MD, [facility] HospitalFAAPMR [facility] 01/19/2019   Jacquelynn CreePamela S Love, PA-C 01/19/2019

## 2019-01-20 ENCOUNTER — Inpatient Hospital Stay (HOSPITAL_COMMUNITY): Payer: Medicaid Other

## 2019-01-20 LAB — GLUCOSE, CAPILLARY
Glucose-Capillary: 230 mg/dL — ABNORMAL HIGH (ref 70–99)
Glucose-Capillary: 237 mg/dL — ABNORMAL HIGH (ref 70–99)
Glucose-Capillary: 263 mg/dL — ABNORMAL HIGH (ref 70–99)
Glucose-Capillary: 269 mg/dL — ABNORMAL HIGH (ref 70–99)
Glucose-Capillary: 280 mg/dL — ABNORMAL HIGH (ref 70–99)
Glucose-Capillary: 283 mg/dL — ABNORMAL HIGH (ref 70–99)
Glucose-Capillary: 294 mg/dL — ABNORMAL HIGH (ref 70–99)

## 2019-01-20 LAB — BASIC METABOLIC PANEL
Anion gap: 9 (ref 5–15)
BUN: 21 mg/dL (ref 8–23)
CO2: 31 mmol/L (ref 22–32)
Calcium: 6.5 mg/dL — ABNORMAL LOW (ref 8.9–10.3)
Chloride: 111 mmol/L (ref 98–111)
Creatinine, Ser: 1.11 mg/dL (ref 0.61–1.24)
GFR calc Af Amer: >60 mL/min (ref >60)
GFR calc non Af Amer: >60 mL/min (ref >60)
Glucose, Bld: 240 mg/dL — ABNORMAL HIGH (ref 70–99)
Potassium: 4.7 mmol/L (ref 3.5–5.1)
Sodium: 151 mmol/L — ABNORMAL HIGH (ref 135–145)

## 2019-01-20 LAB — POCT I-STAT 7, (LYTES, BLD GAS, ICA,H+H)
Acid-Base Excess: 9 mmol/L — ABNORMAL HIGH (ref 0.0–2.0)
Bicarbonate: 33.3 mmol/L — ABNORMAL HIGH (ref 20.0–28.0)
Calcium, Ion: 1.24 mmol/L (ref 1.15–1.40)
HCT: 25 % — ABNORMAL LOW (ref 39.0–52.0)
Hemoglobin: 8.5 g/dL — ABNORMAL LOW (ref 13.0–17.0)
O2 Saturation: 98 %
Patient temperature: 100.1
Potassium: 3.6 mmol/L (ref 3.5–5.1)
Sodium: 152 mmol/L — ABNORMAL HIGH (ref 135–145)
TCO2: 35 mmol/L — ABNORMAL HIGH (ref 22–32)
pCO2 arterial: 45 mmHg (ref 32.0–48.0)
pH, Arterial: 7.48 — ABNORMAL HIGH (ref 7.350–7.450)
pO2, Arterial: 100 mmHg (ref 83.0–108.0)

## 2019-01-20 LAB — CBC WITH DIFFERENTIAL/PLATELET
Abs Immature Granulocytes: 0.03 10*3/uL (ref 0.00–0.07)
Basophils Absolute: 0 10*3/uL (ref 0.0–0.1)
Basophils Relative: 1 %
Eosinophils Absolute: 0.2 10*3/uL (ref 0.0–0.5)
Eosinophils Relative: 3 %
HCT: 26 % — ABNORMAL LOW (ref 39.0–52.0)
Hemoglobin: 8.3 g/dL — ABNORMAL LOW (ref 13.0–17.0)
Immature Granulocytes: 1 %
Lymphocytes Relative: 15 %
Lymphs Abs: 0.9 10*3/uL (ref 0.7–4.0)
MCH: 32 pg (ref 26.0–34.0)
MCHC: 31.9 g/dL (ref 30.0–36.0)
MCV: 100.4 fL — ABNORMAL HIGH (ref 80.0–100.0)
Monocytes Absolute: 0.2 10*3/uL (ref 0.1–1.0)
Monocytes Relative: 4 %
Neutro Abs: 4.5 10*3/uL (ref 1.7–7.7)
Neutrophils Relative %: 76 %
Platelets: 169 10*3/uL (ref 150–400)
RBC: 2.59 MIL/uL — ABNORMAL LOW (ref 4.22–5.81)
RDW: 13.8 % (ref 11.5–15.5)
WBC: 5.8 10*3/uL (ref 4.0–10.5)
nRBC: 0 % (ref 0.0–0.2)

## 2019-01-20 LAB — MAGNESIUM: Magnesium: 1.4 mg/dL — ABNORMAL LOW (ref 1.7–2.4)

## 2019-01-20 LAB — PHOSPHORUS: Phosphorus: 3.5 mg/dL (ref 2.5–4.6)

## 2019-01-20 MED ORDER — INSULIN ASPART 100 UNIT/ML ~~LOC~~ SOLN
2.0000 [IU] | SUBCUTANEOUS | Status: DC
  Administered 2019-01-20 – 2019-01-24 (×18): 2 [IU] via SUBCUTANEOUS

## 2019-01-20 MED ORDER — INSULIN DETEMIR 100 UNIT/ML ~~LOC~~ SOLN
14.0000 [IU] | Freq: Two times a day (BID) | SUBCUTANEOUS | Status: DC
  Administered 2019-01-20 – 2019-01-24 (×8): 14 [IU] via SUBCUTANEOUS
  Filled 2019-01-20 (×10): qty 0.14

## 2019-01-20 NOTE — Final Consult Note (Signed)
Consultant Final Sign-Off Note    Assessment/Final recommendations  Frederick SizerRobert Moody is a [age] y.o. male followed by me for Posterior neck/upper back abscess. This is currently POD #7    Wound care (if applicable): Continue NS WTD dressing changes to wound BID. It appears clean and a VAC can be placed if desired per primary team.    Diet at discharge: per primary team   Activity at discharge: per primary team   Follow-up appointment: Call to schedule an appointment after discharge.    Pending results:  Unresulted Labs (From admission, onward)    Start     Ordered   01/23/19 0500  Creatinine, serum  (enoxaparin (LOVENOX)    CrCl >/= 30 ml/min)  Weekly,   R    Comments: while on enoxaparin therapy   Question:  Specimen collection method  Answer:  Unit=Unit collect   01/16/19 1058   01/21/19 0500  CBC  Tomorrow morning,   STAT    Question:  Specimen collection method  Answer:  Unit=Unit collect   01/20/19 0907   01/21/19 0500  Basic metabolic panel  Tomorrow morning,   STAT    Question:  Specimen collection method  Answer:  Unit=Unit collect   01/20/19 0907   01/21/19 0500  Magnesium  Tomorrow morning,   STAT    Question:  Specimen collection method  Answer:  Unit=Unit collect   01/20/19 0907   01/21/19 0500  Phosphorus  Tomorrow morning,   STAT    Question:  Specimen collection method  Answer:  Unit=Unit collect   01/20/19 0907   01/20/19 0500  CBC  Tomorrow morning,   R    Question:  Specimen collection method  Answer:  Unit=Unit collect   01/19/19 1217   Unscheduled  HIV antibody (Routine Testing)  With next blood draw,   R     01/07/19 2041           Medication recommendations: Abx per ID. Per their note they recommend Ancef x 6 weeks    Other recommendations:    Thank you for allowing us to participate in the care of your patient!  Please consult us again if you have further needs for your patient.  Elmer SowMichael M [facility] 01/20/2019 9:34 AM    Subjective    Re-intubated yesterday. Fever 101.1  Objective  Vital signs in last 24 hours: Temp:  [98 F (36.7 C)-101.1 F (38.4 C)] 98 F (36.7 C) (07/28 0813) Pulse Rate:  [62-115] 67 (07/28 0800) Resp:  [11-37] 16 (07/28 0800) BP: (62-163)/(33-83) 150/70 (07/28 0800) SpO2:  [94 %-100 %] 100 % (07/28 0834) FiO2 (%):  [40 %-100 %] 40 % (07/28 0834) Weight:  [85.7 kg-86.1 kg] 86.1 kg (07/28 0500)  General: On vent Skin: posterior neck/back wound appears clean and is almost all granulation tissue. No evidence of further infection or purulent drainage. Please see picture below.        Pertinent labs and Studies: Recent Labs    01/18/19 0321  01/19/19 1240 01/20/19 0314 01/20/19 0411  WBC 6.7  --   --  5.8  --   HGB 8.4*   < > 7.8* 8.3* 8.5*  HCT 25.9*   < > 23.0* 26.0* 25.0*   < > = values in this interval not displayed.   BMET Recent Labs    01/19/19 0403  01/20/19 0314 01/20/19 0411  NA 148*   < > 151* 152*  K 3.1*   < > 4.7 3.6  CL  112*  --  111  --   CO2 27  --  31  --   GLUCOSE 306*  --  240*  --   BUN 25*  --  21  --   CREATININE 0.92  --  1.11  --   CALCIUM 7.8*  --  6.5*  --    < > = values in this interval not displayed.   No results for input(s): LABURIN in the last 72 hours. Results for orders placed or performed during the hospital encounter of 01/07/19  Culture, blood (Routine x 2)     Status: Abnormal   Collection Time: 01/07/19 11:05 AM   Specimen: BLOOD  Result Value Ref Range Status   Specimen Description BLOOD BLOOD RIGHT FOREARM  Final   Special Requests   Final    BOTTLES DRAWN AEROBIC AND ANAEROBIC Blood Culture adequate volume   Culture  Setup Time   Final    GRAM POSITIVE COCCI IN CLUSTERS IN BOTH AEROBIC AND ANAEROBIC BOTTLES CRITICAL RESULT CALLED TO, READ BACK BY AND VERIFIED WITH: Antoine PrimasE. Martin PharmD 10:15 01/08/19 ([email or web address]) Performed at [facility], [address] [place], [place] [address]    Culture STAPHYLOCOCCUS AUREUS (A)   Final   Report Status 01/10/2019 FINAL  Final   Organism ID, Bacteria STAPHYLOCOCCUS AUREUS  Final      Susceptibility   Staphylococcus aureus - MIC*    CIPROFLOXACIN >=8 RESISTANT Resistant     ERYTHROMYCIN >=8 RESISTANT Resistant     GENTAMICIN <=0.5 SENSITIVE Sensitive     OXACILLIN 0.5 SENSITIVE Sensitive     TETRACYCLINE <=1 SENSITIVE Sensitive     VANCOMYCIN 1 SENSITIVE Sensitive     TRIMETH/SULFA <=10 SENSITIVE Sensitive     CLINDAMYCIN >=8 RESISTANT Resistant     RIFAMPIN <=0.5 SENSITIVE Sensitive     Inducible Clindamycin NEGATIVE Sensitive     * STAPHYLOCOCCUS AUREUS  Blood Culture ID Panel (Reflexed)     Status: Abnormal   Collection Time: 01/07/19 11:05 AM  Result Value Ref Range Status   Enterococcus species NOT DETECTED NOT DETECTED Final   Listeria monocytogenes NOT DETECTED NOT DETECTED Final   Staphylococcus species DETECTED (A) NOT DETECTED Final    Comment: CRITICAL RESULT CALLED TO, READ BACK BY AND VERIFIED WITH: Antoine PrimasE. Martin PharmD 10:15 01/08/19 ([email or web address])    Staphylococcus aureus (BCID) DETECTED (A) NOT DETECTED Final    Comment: Methicillin (oxacillin) susceptible Staphylococcus aureus (MSSA). Preferred therapy is anti staphylococcal beta lactam antibiotic (Cefazolin or Nafcillin), unless clinically contraindicated. CRITICAL RESULT CALLED TO, READ BACK BY AND VERIFIED WITH: Antoine PrimasE. Martin PharmD 10:15 01/08/19 ([email or web address])    Methicillin resistance NOT DETECTED NOT DETECTED Final   Streptococcus species NOT DETECTED NOT DETECTED Final   Streptococcus agalactiae NOT DETECTED NOT DETECTED Final   Streptococcus pneumoniae NOT DETECTED NOT DETECTED Final   Streptococcus pyogenes NOT DETECTED NOT DETECTED Final   Acinetobacter baumannii NOT DETECTED NOT DETECTED Final   Enterobacteriaceae species NOT DETECTED NOT DETECTED Final   Enterobacter cloacae complex NOT DETECTED NOT DETECTED Final   Escherichia coli NOT DETECTED NOT DETECTED Final   Klebsiella oxytoca NOT  DETECTED NOT DETECTED Final   Klebsiella pneumoniae NOT DETECTED NOT DETECTED Final   Proteus species NOT DETECTED NOT DETECTED Final   Serratia marcescens NOT DETECTED NOT DETECTED Final   Haemophilus influenzae NOT DETECTED NOT DETECTED Final   Neisseria meningitidis NOT DETECTED NOT DETECTED Final   Pseudomonas aeruginosa NOT DETECTED NOT DETECTED Final  Candida albicans NOT DETECTED NOT DETECTED Final   Candida glabrata NOT DETECTED NOT DETECTED Final   Candida krusei NOT DETECTED NOT DETECTED Final   Candida parapsilosis NOT DETECTED NOT DETECTED Final   Candida tropicalis NOT DETECTED NOT DETECTED Final    Comment: Performed at [facility], [place] [address] [place], [place] [address]  Culture, blood (Routine x 2)     Status: Abnormal   Collection Time: 01/07/19  5:25 PM   Specimen: BLOOD LEFT ARM  Result Value Ref Range Status   Specimen Description BLOOD LEFT ARM  Final   Special Requests   Final    BOTTLES DRAWN AEROBIC AND ANAEROBIC Blood Culture results may not be optimal due to an inadequate volume of blood received in culture bottles   Culture  Setup Time   Final    GRAM POSITIVE COCCI AEROBIC BOTTLE ONLY CRITICAL RESULT CALLED TO, READ BACK BY AND VERIFIED WITH: PHARMD E MARTIN [id number] AT 1107 AM BY CM    Culture (A)  Final    STAPHYLOCOCCUS AUREUS SUSCEPTIBILITIES PERFORMED ON PREVIOUS CULTURE WITHIN THE LAST 5 DAYS. Performed at [facility], [place] [address] [place], [place] [address]    Report Status 01/10/2019 FINAL  Final  SARS Coronavirus 2 (CEPHEID - Performed in [facility] hospital lab), Hosp Order     Status: None   Collection Time: 01/07/19  5:35 PM   Specimen: Nasopharyngeal Swab  Result Value Ref Range Status   SARS Coronavirus 2 NEGATIVE NEGATIVE Final    Comment: (NOTE) If result is NEGATIVE SARS-CoV-2 target nucleic acids are NOT DETECTED. The SARS-CoV-2 RNA is generally detectable in upper and lower  respiratory specimens during the  acute phase of infection. The lowest  concentration of SARS-CoV-2 viral copies this assay can detect is 250  copies / mL. A negative result does not preclude SARS-CoV-2 infection  and should not be used as the sole basis for treatment or other  patient management decisions.  A negative result may occur with  improper specimen collection / handling, submission of specimen other  than nasopharyngeal swab, presence of viral mutation(s) within the  areas targeted by this assay, and inadequate number of viral copies  (<250 copies / mL). A negative result must be combined with clinical  observations, patient history, and epidemiological information. If result is POSITIVE SARS-CoV-2 target nucleic acids are DETECTED. The SARS-CoV-2 RNA is generally detectable in upper and lower  respiratory specimens dur ing the acute phase of infection.  Positive  results are indicative of active infection with SARS-CoV-2.  Clinical  correlation with patient history and other diagnostic information is  necessary to determine patient infection status.  Positive results do  not rule out bacterial infection or co-infection with other viruses. If result is PRESUMPTIVE POSTIVE SARS-CoV-2 nucleic acids MAY BE PRESENT.   A presumptive positive result was obtained on the submitted specimen  and confirmed on repeat testing.  While 2019 novel coronavirus  (SARS-CoV-2) nucleic acids may be present in the submitted sample  additional confirmatory testing may be necessary for epidemiological  and / or clinical management purposes  to differentiate between  SARS-CoV-2 and other Sarbecovirus currently known to infect humans.  If clinically indicated additional testing with an alternate test  methodology [phone]) is advised. The SARS-CoV-2 RNA is generally  detectable in upper and lower respiratory sp ecimens during the acute  phase of infection. The expected result is Negative. Fact Sheet for Patients:   StrictlyIdeas.no Fact  Sheet for Healthcare Providers: https://pope.com/ This test is not yet approved or cleared by the [place] and has been authorized for detection and/or diagnosis of SARS-CoV-2 by FDA under an Emergency Use Authorization (EUA).  This EUA will remain in effect (meaning this test can be used) for the duration of the COVID-19 declaration under Section 564(b)(1) of the Act, 21 U.S.C. section 360bbb-3(b)(1), unless the authorization is terminated or revoked sooner. Performed at [facility], [address], [place], [place] [address]   Urine culture     Status: Abnormal   Collection Time: 01/07/19  6:22 PM   Specimen: In/Out Cath Urine  Result Value Ref Range Status   Specimen Description IN/OUT CATH URINE  Final   Special Requests NONE  Final   Culture (A)  Final    40,000 COLONIES/mL STAPHYLOCOCCUS AUREUS 20,000 COLONIES/mL STAPHYLOCOCCUS SPECIES (COAGULASE NEGATIVE) 1,000 COLONIES/mL VIRIDANS STREPTOCOCCUS Standardized susceptibility testing for this organism is not available. Performed at [facility], [address] [place], [place] [address]    Report Status 01/10/2019 FINAL  Final   Organism ID, Bacteria STAPHYLOCOCCUS AUREUS (A)  Final   Organism ID, Bacteria STAPHYLOCOCCUS SPECIES (COAGULASE NEGATIVE) (A)  Final      Susceptibility   Staphylococcus aureus - MIC*    CIPROFLOXACIN >=8 RESISTANT Resistant     GENTAMICIN <=0.5 SENSITIVE Sensitive     NITROFURANTOIN <=16 SENSITIVE Sensitive     OXACILLIN 0.5 SENSITIVE Sensitive     TETRACYCLINE <=1 SENSITIVE Sensitive     VANCOMYCIN 1 SENSITIVE Sensitive     TRIMETH/SULFA <=10 SENSITIVE Sensitive     CLINDAMYCIN <=0.25 SENSITIVE Sensitive     RIFAMPIN <=0.5 SENSITIVE Sensitive     Inducible Clindamycin NEGATIVE Sensitive     * 40,000 COLONIES/mL STAPHYLOCOCCUS AUREUS   Staphylococcus species (coagulase negative) - MIC*     CIPROFLOXACIN <=0.5 SENSITIVE Sensitive     GENTAMICIN <=0.5 SENSITIVE Sensitive     NITROFURANTOIN 32 SENSITIVE Sensitive     OXACILLIN <=0.25 SENSITIVE Sensitive     TETRACYCLINE <=1 SENSITIVE Sensitive     VANCOMYCIN <=0.5 SENSITIVE Sensitive     TRIMETH/SULFA <=10 SENSITIVE Sensitive     CLINDAMYCIN <=0.25 SENSITIVE Sensitive     RIFAMPIN <=0.5 SENSITIVE Sensitive     Inducible Clindamycin NEGATIVE Sensitive     * 20,000 COLONIES/mL STAPHYLOCOCCUS SPECIES (COAGULASE NEGATIVE)  Culture, blood (routine x 2)     Status: None   Collection Time: 01/09/19  8:25 AM   Specimen: BLOOD LEFT HAND  Result Value Ref Range Status   Specimen Description BLOOD LEFT HAND  Final   Special Requests   Final    BOTTLES DRAWN AEROBIC ONLY Blood Culture adequate volume   Culture   Final    NO GROWTH 5 DAYS Performed at [facility], [address], [place], [place] [address]    Report Status 01/14/2019 FINAL  Final  Culture, blood (routine x 2)     Status: None   Collection Time: 01/09/19  8:26 AM   Specimen: BLOOD LEFT HAND  Result Value Ref Range Status   Specimen Description BLOOD LEFT HAND  Final   Special Requests   Final    BOTTLES DRAWN AEROBIC ONLY Blood Culture adequate volume   Culture   Final    NO GROWTH 5 DAYS Performed at [facility], [address] [place], [place] [address]    Report Status 01/14/2019 FINAL  Final  Culture, Urine  Status: None   Collection Time: 01/10/19 11:24 AM   Specimen: Urine, Random  Result Value Ref Range Status   Specimen Description URINE, RANDOM  Final   Special Requests NONE  Final   Culture   Final    NO GROWTH Performed at [facility], [address] [place], [place] [address]    Report Status 01/11/2019 FINAL  Final  C difficile quick scan w PCR reflex     Status: None   Collection Time: 01/11/19  3:44 PM   Specimen: STOOL  Result Value Ref Range Status   C Diff antigen NEGATIVE NEGATIVE Final   C Diff toxin  NEGATIVE NEGATIVE Final   C Diff interpretation No C. difficile detected.  Final    Comment: Performed at [facility], [address], [place], [place] [address]  MRSA PCR Screening     Status: None   Collection Time: 01/11/19 11:42 PM   Specimen: Nasal Mucosa; Nasopharyngeal  Result Value Ref Range Status   MRSA by PCR NEGATIVE NEGATIVE Final    Comment:        The GeneXpert MRSA Assay (FDA approved for NASAL specimens only), is one component of a comprehensive MRSA colonization surveillance program. It is not intended to diagnose MRSA infection nor to guide or monitor treatment for MRSA infections. Performed at [facility], [address] [place], [place] [address]   Culture, blood (routine x 2)     Status: None   Collection Time: 01/12/19  1:43 AM   Specimen: BLOOD LEFT HAND  Result Value Ref Range Status   Specimen Description BLOOD LEFT HAND  Final   Special Requests   Final    BOTTLES DRAWN AEROBIC ONLY Blood Culture results may not be optimal due to an inadequate volume of blood received in culture bottles   Culture   Final    NO GROWTH 5 DAYS Performed at [facility], [address], [place], [place] [address]    Report Status 01/17/2019 FINAL  Final  Culture, blood (routine x 2)     Status: None   Collection Time: 01/12/19  1:50 AM   Specimen: BLOOD RIGHT HAND  Result Value Ref Range Status   Specimen Description BLOOD RIGHT HAND  Final   Special Requests   Final    BOTTLES DRAWN AEROBIC ONLY Blood Culture adequate volume   Culture   Final    NO GROWTH 5 DAYS Performed at [facility], [address] [place], [place] [address]    Report Status 01/17/2019 FINAL  Final  Culture, respiratory (non-expectorated)     Status: None   Collection Time: 01/12/19 12:47 PM   Specimen: Tracheal Aspirate; Respiratory  Result Value Ref Range Status   Specimen Description TRACHEAL ASPIRATE  Final   Special Requests NONE  Final   Gram Stain    Final    RARE WBC PRESENT, PREDOMINANTLY PMN NO ORGANISMS SEEN Performed at [facility], [address], [place], [place] [address]    Culture FEW STENOTROPHOMONAS MALTOPHILIA  Final   Report Status 01/15/2019 FINAL  Final   Organism ID, Bacteria STENOTROPHOMONAS MALTOPHILIA  Final      Susceptibility   Stenotrophomonas maltophilia - MIC*    LEVOFLOXACIN 2 SENSITIVE Sensitive     TRIMETH/SULFA 80 RESISTANT Resistant     * FEW STENOTROPHOMONAS MALTOPHILIA  Surgical PCR screen     Status: Abnormal   Collection Time: 01/13/19  9:00  AM   Specimen: Nasal Mucosa; Nasal Swab  Result Value Ref Range Status   MRSA, PCR NEGATIVE NEGATIVE Final   Staphylococcus aureus POSITIVE (A) NEGATIVE Final    Comment: (NOTE) The Xpert SA Assay (FDA approved for NASAL specimens in patients 64 years of age and older), is one component of a comprehensive surveillance program. It is not intended to diagnose infection nor to guide or monitor treatment. Performed at [facility], [address] [place], [place] [address]   Aerobic/Anaerobic Culture (surgical/deep wound)     Status: None   Collection Time: 01/13/19 12:37 PM   Specimen: Abscess  Result Value Ref Range Status   Specimen Description ABSCESS UPPER BACK  Final   Special Requests NONE  Final   Gram Stain NO WBC SEEN FEW GRAM POSITIVE COCCI IN PAIRS   Final   Culture   Final    MODERATE STAPHYLOCOCCUS AUREUS NO ANAEROBES ISOLATED Performed at [facility], [address] [place], [place] [address]    Report Status 01/18/2019 FINAL  Final   Organism ID, Bacteria STAPHYLOCOCCUS AUREUS  Final      Susceptibility   Staphylococcus aureus - MIC*    CIPROFLOXACIN >=8 RESISTANT Resistant     ERYTHROMYCIN <=0.25 SENSITIVE Sensitive     GENTAMICIN <=0.5 SENSITIVE Sensitive     OXACILLIN 0.5 SENSITIVE Sensitive     TETRACYCLINE <=1 SENSITIVE Sensitive     VANCOMYCIN <=0.5 SENSITIVE Sensitive     TRIMETH/SULFA <=10 SENSITIVE  Sensitive     CLINDAMYCIN <=0.25 SENSITIVE Sensitive     RIFAMPIN <=0.5 SENSITIVE Sensitive     Inducible Clindamycin NEGATIVE Sensitive     * MODERATE STAPHYLOCOCCUS AUREUS    Imaging: Dg Chest Port 1 View  Result Date: 01/20/2019 CLINICAL DATA:  63 year old male with a history of endotracheal tube EXAM: PORTABLE CHEST 1 VIEW COMPARISON:  January 19, 2019 FINDINGS: Cardiomediastinal silhouette likely unchanged given the differing position and the low lung volumes. Fullness in the central vascular with interlobular septal thickening. Increasing airspace opacities of the bilateral lungs. Partial obscuration of the bilateral hemidiaphragm. No pneumothorax. Endotracheal tube terminates 19 mm above the carina, advanced since the prior. Right IJ central venous catheter appears to terminate in the superior vena cava. Gastric tube projects over the mediastinum and out of the field of view. No displaced fracture. IMPRESSION: Endotracheal tube has been advanced in the interval, now projecting 19 mm above the carina. This may be withdrawn for more optimal positioning. Increasing airspace opacities bilaterally with interlobular septal thickening, likely a combination of edema, atelectasis/consolidation, and bilateral pleural effusions. Right IJ central venous catheter unchanged. Gastric tube unchanged. Electronically Signed   By: Gilmer MorJaime  Wagner D.O.   On: 01/20/2019 08:34   Portable Chest X-ray  Result Date: 01/19/2019 CLINICAL DATA:  Patient status post intubation today. EXAM: PORTABLE CHEST 1 VIEW COMPARISON:  Single-view of the chest earlier today. FINDINGS: New endotracheal tube is in place with the tip in good position just below the clavicular heads in the above the carina. Right IJ catheter and NG tube are unchanged. Right greater than left effusions and airspace disease are unchanged. No pneumothorax. Heart size is normal. IMPRESSION: Endotracheal tube in good position.  No other change. Electronically Signed    By: Drusilla Kannerhomas  Dalessio M.D.   On: 01/19/2019 11:41   Dg Chest Port 1 View  Result Date: 01/19/2019 CLINICAL DATA:  Patient admitted 01/07/2019 in diabetic ketoacidosis. The patient subsequently underwent drainage of an abscess  on the back 01/12/2019. EXAM: PORTABLE CHEST 1 VIEW COMPARISON:  Single-view of the chest 01/12/2019. FINDINGS: Right IJ catheter and NG tube remain in place. Endotracheal tube has been removed. Right greater than left effusions and airspace disease have worsened. No pneumothorax. Heart size is upper normal. Aortic atherosclerosis noted. IMPRESSION: Status post extubation. Right IJ catheter and NG tube remain in place. Worsened right greater than left pleural effusions and airspace disease which could be due to atelectasis or pneumonia. Electronically Signed   By: Drusilla Kanner M.D.   On: 01/19/2019 09:55

## 2019-01-20 NOTE — Progress Notes (Signed)
PT Cancellation Note  Patient Details Name: Frederick Moody MRN: [id number] DOB: 26-Sep-1955   Cancelled Treatment:    Reason Eval/Treat Not Completed: Patient not medically ready. Pt reintubated 7/27. Will continue to follow.    Shary Decamp [facility] 01/20/2019, 10:53 AM [place] Pager [phone] Office [phone]

## 2019-01-20 NOTE — Progress Notes (Signed)
SLP Cancellation Note  Patient Details Name: Mayes Sangiovanni MRN: [id number] DOB: 1956/05/31   Cancelled treatment:       Reason Eval/Treat Not Completed: Medical issues which prohibited therapy - reintubated on previous date.    Venita [address] 01/20/2019, 8:08 AM  Pollyann Glen, M.A. [place] Acute Environmental education officer [phone] Office [phone]

## 2019-01-20 NOTE — Progress Notes (Signed)
[facility]   Reason for visit: Follow up on bacteremia  Interval History: remains intubated.  CXR with increased opacities;  Day 2 levaquin Day 2 cefazolin Day 9 total antibiotics    Physical Exam: Constitutional:  Vitals:   01/20/19 0813 01/20/19 0834  BP:    Pulse:    Resp:    Temp: 98 F (36.7 C)   SpO2:  100%   patientsedated Eyes: anicteric HENT:+ET Respiratory: respiratory effort on vent; some rhonchi Cardiovascular: RRR GI: soft, nt, nd  Review of Systems: Unable to be assessed due to patient factors  Lab Results  Component Value Date   WBC 5.8 01/20/2019   HGB 8.5 (L) 01/20/2019   HCT 25.0 (L) 01/20/2019   MCV 100.4 (H) 01/20/2019   PLT 169 01/20/2019    Lab Results  Component Value Date   CREATININE 1.11 01/20/2019   BUN 21 01/20/2019   NA 152 (H) 01/20/2019   K 3.6 01/20/2019   CL 111 01/20/2019   CO2 31 01/20/2019    Lab Results  Component Value Date   ALT 32 01/18/2019   AST 57 (H) 01/18/2019   ALKPHOS 89 01/18/2019     Microbiology: Recent Results (from the past 240 hour(s))  Culture, Urine     Status: None   Collection Time: 01/10/19 11:24 AM   Specimen: Urine, Random  Result Value Ref Range Status   Specimen Description URINE, RANDOM  Final   Special Requests NONE  Final   Culture   Final    NO GROWTH Performed at [facility], [address] [place], [place] [address]    Report Status 01/11/2019 FINAL  Final  C difficile quick scan w PCR reflex     Status: None   Collection Time: 01/11/19  3:44 PM   Specimen: STOOL  Result Value Ref Range Status   C Diff antigen NEGATIVE NEGATIVE Final   C Diff toxin NEGATIVE NEGATIVE Final   C Diff interpretation No C. difficile detected.  Final    Comment: Performed at [facility], [address] [place], [place] [address]  MRSA PCR Screening     Status: None   Collection Time: 01/11/19 11:42 PM   Specimen: Nasal Mucosa; Nasopharyngeal   Result Value Ref Range Status   MRSA by PCR NEGATIVE NEGATIVE Final    Comment:        The GeneXpert MRSA Assay (FDA approved for NASAL specimens only), is one component of a comprehensive MRSA colonization surveillance program. It is not intended to diagnose MRSA infection nor to guide or monitor treatment for MRSA infections. Performed at [facility], [address] [place], [place] [address]   Culture, blood (routine x 2)     Status: None   Collection Time: 01/12/19  1:43 AM   Specimen: BLOOD LEFT HAND  Result Value Ref Range Status   Specimen Description BLOOD LEFT HAND  Final   Special Requests   Final    BOTTLES DRAWN AEROBIC ONLY Blood Culture results may not be optimal due to an inadequate volume of blood received in culture bottles   Culture   Final    NO GROWTH 5 DAYS Performed at [facility], [address] [place], [place] [address]    Report Status 01/17/2019 FINAL  Final  Culture, blood (routine x 2)     Status: None   Collection Time: 01/12/19  1:50 AM   Specimen: BLOOD  RIGHT HAND  Result Value Ref Range Status   Specimen Description BLOOD RIGHT HAND  Final   Special Requests   Final    BOTTLES DRAWN AEROBIC ONLY Blood Culture adequate volume   Culture   Final    NO GROWTH 5 DAYS Performed at [facility], [address], [place], [place] [address]    Report Status 01/17/2019 FINAL  Final  Culture, respiratory (non-expectorated)     Status: None   Collection Time: 01/12/19 12:47 PM   Specimen: Tracheal Aspirate; Respiratory  Result Value Ref Range Status   Specimen Description TRACHEAL ASPIRATE  Final   Special Requests NONE  Final   Gram Stain   Final    RARE WBC PRESENT, PREDOMINANTLY PMN NO ORGANISMS SEEN Performed at [facility], [place] [address] [place], [place] [address]    Culture FEW STENOTROPHOMONAS MALTOPHILIA  Final   Report Status 01/15/2019 FINAL  Final   Organism ID, Bacteria STENOTROPHOMONAS MALTOPHILIA   Final      Susceptibility   Stenotrophomonas maltophilia - MIC*    LEVOFLOXACIN 2 SENSITIVE Sensitive     TRIMETH/SULFA 80 RESISTANT Resistant     * FEW STENOTROPHOMONAS MALTOPHILIA  Surgical PCR screen     Status: Abnormal   Collection Time: 01/13/19  9:00 AM   Specimen: Nasal Mucosa; Nasal Swab  Result Value Ref Range Status   MRSA, PCR NEGATIVE NEGATIVE Final   Staphylococcus aureus POSITIVE (A) NEGATIVE Final    Comment: (NOTE) The Xpert SA Assay (FDA approved for NASAL specimens in patients 26 years of age and older), is one component of a comprehensive surveillance program. It is not intended to diagnose infection nor to guide or monitor treatment. Performed at [facility], [place] [address], [place], [place] [address]   Aerobic/Anaerobic Culture (surgical/deep wound)     Status: None   Collection Time: 01/13/19 12:37 PM   Specimen: Abscess  Result Value Ref Range Status   Specimen Description ABSCESS UPPER BACK  Final   Special Requests NONE  Final   Gram Stain NO WBC SEEN FEW GRAM POSITIVE COCCI IN PAIRS   Final   Culture   Final    MODERATE STAPHYLOCOCCUS AUREUS NO ANAEROBES ISOLATED Performed at [facility], [address] [place], [place] [address]    Report Status 01/18/2019 FINAL  Final   Organism ID, Bacteria STAPHYLOCOCCUS AUREUS  Final      Susceptibility   Staphylococcus aureus - MIC*    CIPROFLOXACIN >=8 RESISTANT Resistant     ERYTHROMYCIN <=0.25 SENSITIVE Sensitive     GENTAMICIN <=0.5 SENSITIVE Sensitive     OXACILLIN 0.5 SENSITIVE Sensitive     TETRACYCLINE <=1 SENSITIVE Sensitive     VANCOMYCIN <=0.5 SENSITIVE Sensitive     TRIMETH/SULFA <=10 SENSITIVE Sensitive     CLINDAMYCIN <=0.25 SENSITIVE Sensitive     RIFAMPIN <=0.5 SENSITIVE Sensitive     Inducible Clindamycin NEGATIVE Sensitive     * MODERATE STAPHYLOCOCCUS AUREUS    Impression/Plan:  1. MSSA bacteremia - on cefazolin for this.  Will need a prolonged course.    2.  Abscess - same growth.  Tracks down to spinous process and will treat with prolonged IV therapy as above.  3. Pneumonia - worsened pulmonary status.  On levaquin for broader coverage to include previously identified Stenotrophomonas.

## 2019-01-20 NOTE — Progress Notes (Signed)
OT Cancellation Note  Patient Details Name: Frederick Moody MRN: [id number] DOB: December 22, 1955   Cancelled Treatment:    Reason Eval/Treat Not Completed: Patient not medically ready(REintubated 7/27)  [place], OT/L   Acute OT Clinical Specialist Acute Rehabilitation Services Pager [phone] Office [phone]  01/20/2019, 1:48 PM

## 2019-01-20 NOTE — Progress Notes (Addendum)
NAME:  Frederick SizerRobert Arrona, MRN:  [id number], DOB:  10-28-1955, LOS: 13 ADMISSION DATE:  01/07/2019, CONSULTATION DATE:  01/11/19 CHIEF COMPLAINT:  Encephaolpathy   Brief History   This is a 63 yo with history of uncontrolled DM and etoh abuse who presents for weakness and falls. Found to have etoh withdrawal and DKA (resolved) and MSSA bacteremia likely source is abscess on upper back. Patient was admitted to CCM after being unable to protect his airway, thought to be due to excess benzodiazepenes with his withdrawal.   Past Medical History  Poorly controlled DM, ETOH abuse  Principal Problem:   MSSA bacteremia Active Problems:   DKA (diabetic ketoacidoses) (HCC)   Alcohol withdrawal (HCC)   Aspiration pneumonia (HCC)   Sepsis (HCC)   Acute encephalopathy   Acute respiratory failure (HCC)   Malnutrition of moderate degree   Hypokalemia   Pressure injury of skin   Significant Hospital Events   01/07/19 - admitted for MSSA positive  01/11/19 - intubated and transferred to MICU 01/12/19 - OR for I & D of upper back   Consults:  ID  Surgery  Nutrition  Cardiology   Procedures:  01/11/2019-intubation 01/11/19 - Central Line 7/20 > OR for abscess drain  7/20 : I/O, foley placement   Significant Diagnostic Tests:  7/20 MRI Elbow >> severe myositis, small fluid collection, subcutaneous swelling  7/20 Ab XR Gastric tube courses into the stomach with its tip in the antropyloric region, appropriately positioned.  Ct Head Wo Contrast 7/20 CT Head w/o - no acute findings, remote infarcts, chronic axillary sinusitis 7/20 CT chest W/O-moderate layering pleural effusions with multisegmental atelectasis.  Dorsal cervical thoracic soft tissue inflammation 7/20/CT spine 11 x 5 x 6 cm abscess in the dorsal cervicothoracic soft tissues extending from the skin surface into the superficial musculature. There is extension to spinous processes without bony erosion.  7/17 TTE -normal systolic  function with EF 55 to 60%.  Left atrium mildly dilated.  Mild mitral annular calcification present.  No stenosis of aortic valve but with mild thickening and calcification.  7/17 UE U/S Focal area of soft tissue edema with surrounding Doppler flow in the medial aspect of left elbow which appears to be centered in the flexor musculature concerning for myositis. If there is further clinical concern regarding septic arthritis, recommend an MRI of the left elbow. .   7/22 left upper extremity MRI -forearm: Extensive muscle and subcutaneous edema visualized throughout the left arm consistent with cellulitis and myositis.  Humerus: Inflammation spreads to shoulder girdle dural and left upper arm and left lateral chest consistent with severe myositis.  Micro Data:  C. difficile PCR negative MRSA negative 01/07/19 - blood culture >> + MSSA (Resistant to cipro, clinda, erythro) 01/07/19 - Urine cx >> + staph aureus pan senstive (Resistant to cipro)  01/09/19 - Blood culture >> NG 01/10/19 - Urine Culture >> NG 01/12/19 - Blood culture >> NG  01/12/19 - Abscess culture >> staph aureus resistant to Cipro 01/12/19 -trach aspirate: Stenotrophomonas final  Antimicrobials:  7/20 Unasyn >>  7/16-17, 7/20 Vanc 7/16> CTX 7/16 > Ancef 7/15-7/16> Cefepime  Interim history/subjective:  Reintuabted yesterday r/t AMS, inability to protect to airway.   Objective   Temp:  [98 F (36.7 C)-101.1 F (38.4 C)] 98 F (36.7 C) (07/28 0813) Pulse Rate:  [62-117] 67 (07/28 0800) Resp:  [11-37] 16 (07/28 0800) BP: (62-163)/(33-83) 150/70 (07/28 0800) SpO2:  [94 %-100 %] 100 % (07/28 0834) FiO2 (%):  [  40 %-100 %] 40 % (07/28 0834) Weight:  [85.7 kg-86.1 kg] 86.1 kg (07/28 0500)     Vent Mode: PSV;CPAP FiO2 (%):  [40 %-100 %] 40 % Set Rate:  [15 bmp] 15 bmp Vt Set:  [560 mL] 560 mL PEEP:  [5 cmH20-8 cmH20] 5 cmH20 Pressure Support:  [12 cmH20] 12 cmH20 Plateau Pressure:  [16 cmH20-19 cmH20] 19 cmH20   Intake/Output      07/27 0701 - 07/28 0700 07/28 0701 - 07/29 0700   I.V. (mL/kg) 231 (2.7) 24.6 (0.3)   NG/GT 1946.3 150   IV Piggyback 406.3 100   Total Intake(mL/kg) 2583.6 (30) 274.6 (3.2)   Urine (mL/kg/hr) 3835 (1.9) 200 (1.1)   Emesis/NG output     Stool 300    Total Output 4135 200   Net -1551.4 +74.6           Filed Weights   01/19/19 0500 01/19/19 1046 01/20/19 0500  Weight: 88.3 kg 85.7 kg 86.1 kg   Examination: General: chronically ill appearing male, NAD on vent  Lungs: resps even non labored on PS12/5, few scattered rhonchi  HEENT: [place]/AT, PERRL, EOM-I and MMM, ETT in place now Cardiovascular: RRR, Nl S1/S2 and -M/R/G Abdomen: Soft, NT, ND and +BS Extremities: -edema and -tenderness Neuro: sedated, RASS -1, very low dose precedex (asked RN to turn off), attempts to follow some commands intermittently when stimulated  GU: Foley cath in place    Assessment & Plan:  MSSA Bacteremia likely due to soft tissue abscess on upper back, s/p drainage in the OR. No TEE per cards. Per ID, patient will need long course of ancef as patient is not getting TEE. White count is stable. No fevers. Continue on unasyn for steno on trach aspirate x 7 days followed by ancef.  PLAN -  - levaquin  - Blood culture if fever   Acute hypoxic respiratory failure - reintubated 7/27 r/t AMS and inability to protect airway. C/b pulm edema +/- aspiration PNA.   PLAN -  Vent support - 8cc/kg  F/u CXR  F/u ABG Daily SBT  Will need speech eval   AMS - ?ETOH withdrawal  PLAN -  Wean precedex to off as able  Consider neuro imaging if mental status does not improve  Correct Na  Supportive care  PRN ativan   Hypernatremia  PLAN -  - Free water 350 q4  Severe Myositis & cellulitis of LUE  Orthopedic surgery consulted and recommend supportive measures with no drainable fluid at this time.  They have signed off.  CK wnl PLAN  - Monitor    Diabetes - A1c 11.7.   PLAN -  SSI  levemir    Chronic anemia  PLAN -  Monitor on lovenox  F/u CBC   Nutrition  PLAN -  Hold TF for now - weaning, may be able to attempt extubation again in next 24h Needs swallow eval once extubated     Best practice:  Diet: NPO Pain/Anxiety/Delirium protocol (if indicated): fentaNYL (SUBLIMAZE) injection, fentaNYL (SUBLIMAZE) injection, haloperidol lactate, ibuprofen, lip balm, LORazepam, morphine injection, polyvinyl alcohol  VAP protocol (if indicated): No DVT prophylaxis: LOVENOX GI prophylaxis: PTX Glucose control: levemir, SSI  Mobility: BR Code Status: Full  Family Communication: Sister daily  Disposition: ICU   Dirk DressKaty Whiteheart, NP 01/20/2019  9:07 AM Pager: [phone] or [phone]  Attending Note:  63 year old male with etoh withdrawal and DKA who was admitted on 7/15 and intubated on 7/19 to  be extubated on the 7/26 and reintubated on 7/27 due to inability to protect his airway.  Patient was agitated overnight and precedex was started.  On exam, he is unresponsive with coarse BS diffusely.  I reviewed CXR myself, infiltrate noted and ETT in place.  Discussed with PCCM-NP.  Will d/c sedation today.  Continue abx.  F/u on cultures.  Spoke with sister regarding trach/peg vs comfort.  She will speak to the son and daughter then will call us back.  PCCM will continue to manage.  The patient is critically ill with multiple organ systems failure and requires high complexity decision making for assessment and support, frequent evaluation and titration of therapies, application of advanced monitoring technologies and extensive interpretation of multiple databases.   Critical Care Time devoted to patient care services described in this note is  40  Minutes. This time reflects time of care of this signee Dr Jennet Maduro. This critical care time does not reflect procedure time, or teaching time or supervisory time of PA/NP/Med student/Med Resident etc but could involve care discussion  time.  Rush Farmer, M.D. [facility] Pulmonary/Critical Care Medicine. Pager: [phone]. After hours pager: [phone].

## 2019-01-21 ENCOUNTER — Inpatient Hospital Stay (HOSPITAL_COMMUNITY): Payer: Medicaid Other

## 2019-01-21 DIAGNOSIS — E1311 Other specified diabetes mellitus with ketoacidosis with coma: Secondary | ICD-10-CM

## 2019-01-21 LAB — BASIC METABOLIC PANEL
Anion gap: 6 (ref 5–15)
BUN: 24 mg/dL — ABNORMAL HIGH (ref 8–23)
CO2: 30 mmol/L (ref 22–32)
Calcium: 7.8 mg/dL — ABNORMAL LOW (ref 8.9–10.3)
Chloride: 108 mmol/L (ref 98–111)
Creatinine, Ser: 0.8 mg/dL (ref 0.61–1.24)
GFR calc Af Amer: >60 mL/min (ref >60)
GFR calc non Af Amer: >60 mL/min (ref >60)
Glucose, Bld: 149 mg/dL — ABNORMAL HIGH (ref 70–99)
Potassium: 3 mmol/L — ABNORMAL LOW (ref 3.5–5.1)
Sodium: 144 mmol/L (ref 135–145)

## 2019-01-21 LAB — CBC
HCT: 25.4 % — ABNORMAL LOW (ref 39.0–52.0)
Hemoglobin: 8.2 g/dL — ABNORMAL LOW (ref 13.0–17.0)
MCH: 32.3 pg (ref 26.0–34.0)
MCHC: 32.3 g/dL (ref 30.0–36.0)
MCV: 100 fL (ref 80.0–100.0)
Platelets: 133 10*3/uL — ABNORMAL LOW (ref 150–400)
RBC: 2.54 MIL/uL — ABNORMAL LOW (ref 4.22–5.81)
RDW: 13.9 % (ref 11.5–15.5)
WBC: 7.3 10*3/uL (ref 4.0–10.5)
nRBC: 0 % (ref 0.0–0.2)

## 2019-01-21 LAB — GLUCOSE, CAPILLARY
Glucose-Capillary: 152 mg/dL — ABNORMAL HIGH (ref 70–99)
Glucose-Capillary: 169 mg/dL — ABNORMAL HIGH (ref 70–99)
Glucose-Capillary: 147 mg/dL — ABNORMAL HIGH (ref 70–99)
Glucose-Capillary: 91 mg/dL (ref 70–99)
Glucose-Capillary: 98 mg/dL (ref 70–99)

## 2019-01-21 LAB — PHOSPHORUS: Phosphorus: 3.9 mg/dL (ref 2.5–4.6)

## 2019-01-21 LAB — MAGNESIUM: Magnesium: 1.6 mg/dL — ABNORMAL LOW (ref 1.7–2.4)

## 2019-01-21 MED ORDER — POTASSIUM CHLORIDE 20 MEQ PO PACK
40.0000 meq | PACK | Freq: Two times a day (BID) | ORAL | Status: DC
  Administered 2019-01-21: 40 meq via ORAL
  Filled 2019-01-21: qty 2

## 2019-01-21 MED ORDER — POTASSIUM CHLORIDE 20 MEQ PO PACK
40.0000 meq | PACK | Freq: Two times a day (BID) | ORAL | Status: AC
  Administered 2019-01-21: 40 meq
  Filled 2019-01-21: qty 2

## 2019-01-21 MED ORDER — CLONIDINE HCL 0.2 MG PO TABS: 0.2000 mg | ORAL_TABLET | Freq: Four times a day (QID) | ORAL | Status: DC

## 2019-01-21 MED ORDER — FOLIC ACID 1 MG PO TABS
1.0000 mg | ORAL_TABLET | Freq: Every day | ORAL | Status: DC
  Administered 2019-01-22 – 2019-01-26 (×5): 1 mg
  Filled 2019-01-21 (×5): qty 1

## 2019-01-21 MED ORDER — CHLORHEXIDINE GLUCONATE 0.12 % MT SOLN
15.0000 mL | Freq: Two times a day (BID) | OROMUCOSAL | Status: DC
  Administered 2019-01-21 – 2019-02-09 (×34): 15 mL via OROMUCOSAL
  Filled 2019-01-21 (×34): qty 15

## 2019-01-21 MED ORDER — MAGNESIUM SULFATE 2 GM/50ML IV SOLN
2.0000 g | Freq: Once | INTRAVENOUS | Status: AC
  Administered 2019-01-21: 2 g via INTRAVENOUS
  Filled 2019-01-21: qty 50

## 2019-01-21 MED ORDER — VITAMIN B-1 100 MG PO TABS
100.0000 mg | ORAL_TABLET | Freq: Every day | ORAL | Status: DC
  Administered 2019-01-22 – 2019-01-26 (×5): 100 mg
  Filled 2019-01-21 (×5): qty 1

## 2019-01-21 MED ORDER — CLONIDINE HCL 0.2 MG PO TABS
0.2000 mg | ORAL_TABLET | Freq: Four times a day (QID) | ORAL | Status: AC
  Administered 2019-01-21 – 2019-01-22 (×7): 0.2 mg
  Filled 2019-01-21 (×8): qty 1

## 2019-01-21 MED ORDER — ADULT MULTIVITAMIN W/MINERALS CH
1.0000 | ORAL_TABLET | Freq: Every day | ORAL | Status: DC
  Administered 2019-01-22 – 2019-01-26 (×5): 1
  Filled 2019-01-21 (×5): qty 1

## 2019-01-21 MED ORDER — ORAL CARE MOUTH RINSE
15.0000 mL | Freq: Two times a day (BID) | OROMUCOSAL | Status: DC
  Administered 2019-01-22 – 2019-01-25 (×6): 15 mL via OROMUCOSAL

## 2019-01-21 NOTE — Procedures (Signed)
Extubation Procedure Note  Patient Details:   Name: Frederick Moody DOB: Dec 08, 1955 MRN: [id number]   Airway Documentation:    Vent end date: 01/21/19 Vent end time: 1240   Evaluation  O2 sats: stable throughout Complications: No apparent complications Patient did tolerate procedure well. Bilateral Breath Sounds: Clear, Diminished   Yes   Pt was extubated per Md order and placed on 3 L [place]. Positive cuff leak was noted prior to extubation. Pt is stable at this time. RT will continue to monitor.    Ronaldo Miyamoto 01/21/2019, 12:44 PM

## 2019-01-21 NOTE — Progress Notes (Signed)
 Nutrition Follow-up  DOCUMENTATION CODES:   Non-severe (moderate) malnutrition in context of chronic illness  INTERVENTION:   Await diet advancement  Recommend oral nutrition supplements once diet advanced  Continue MVI with Minerals   NUTRITION DIAGNOSIS:   Moderate Malnutrition related to chronic illness(poorly controlled DM, EtOH abuse, abscess) as evidenced by percent weight loss, mild muscle depletion.  GOAL:   Patient will meet greater than or equal to 90% of their needs   MONITOR:   Diet advancement, PO intake, Labs, Weight trends, Skin  REASON FOR ASSESSMENT:   Ventilator    ASSESSMENT:   63 yo male with hx of uncontrolled DM and EtOH abuse presents with weakness and falls. Pt admitted with DKA, sepsis with MSSA bacteremia and abscess on back, AMS with EtOH withdrawal and possible benzo OD and aspiration requiring intubation.  7/15 Admit 7/19 Intubated 7/21 I&D posterior neck/upper back abscess; 15 cm x 10 cm x 5 cm 7/24 Extubated 7/27 Re-Intubated 7/29 Extubated  Extubated today, TF on hold; previously receiving Vital Af 1.2 at 75 ml/hr NPO, await SLP eval  Pt with neck/upper back abscess measuring 10 cm x 15 cm x 5 cm deep Current wt 86 kg; admission weight 85 kg  Labs: reviewed Meds:MVI, KCL, ss novolog, levemir, novolog    Diet Order:   Diet Order            Diet NPO time specified  Diet effective now              EDUCATION NEEDS:   Not appropriate for education at this time  Skin:  Skin Assessment: Skin Integrity Issues: Skin Integrity Issues:: Stage II Stage II: sacrm Other: neck/back abcess measuring 15 x 10 x 5 cm post debridment  Last BM:  7/29  Height:   Ht Readings from Last 1 Encounters:  01/12/19 5\' 9"  (1.753 m)    Weight:   Wt Readings from Last 1 Encounters:  01/21/19 86 kg    Ideal Body Weight:     BMI:  Body mass index is 28 kg/m.  Estimated Nutritional Needs:   Kcal:  2200-2400 kcals  Protein:   125-155 g  Fluid:  >/= 2 L     Dacota Ruben MS, RDN, LDN, CNSC [phone] Pager  [phone] Weekend/On-Call Pager

## 2019-01-21 NOTE — Evaluation (Signed)
Occupational Therapy Evaluation Patient Details Name: Frederick SizerRobert Moody MRN: [id number] DOB: [birth year] Today's Date: 01/21/2019    History of Present Illness Pt is a 63 yo male with hx of uncontrolled DM and EtOH abuse who presents with weakness and falls. Pt admitted with DKA, sepsis with MSSA bacteremia and abscess on back (s/p I&D 7/21), AMS with EtOH withdrawal and possible benzo OD and aspiration requiring intubation 7/19-7/24. Re-intubated 7/27-7/29.    Clinical Impression   This 63 y/o male presents with the above. PTA pt was independent with ADL and mobility. Pt presenting with UE/LE weakness, decreased activity tolerance and sitting/standing balance. Pt currently requires two person assist for trial of sit<>stand at RW. [facility] max-[facility]A for LB ADL. VSS throughout session today. He will benefit from continued acute OT services and recommend CIR level therapy services after discharge to maximize his safety and independence with ADL and mobility. Will follow.     Follow Up Recommendations  CIR;Supervision/Assistance - 24 hour    Equipment Recommendations  Other (comment)(TBD)           Precautions / Restrictions Precautions Precautions: Fall Restrictions Weight Bearing Restrictions: No      Mobility Bed Mobility Overal bed mobility: Needs Assistance Bed Mobility: Supine to Sit;Sit to Supine     Supine to sit: Mod assist Sit to supine: Mod assist   General bed mobility comments: posterior lean sitting EOB , able to self correct at times with cues.   Transfers Overall transfer level: Needs assistance Equipment used: Rolling walker (2 wheeled) Transfers: Sit to/from Stand;Lateral/Scoot Transfers Sit to Stand: Mod assist;+2 physical assistance        Lateral/Scoot Transfers: +2 physical assistance;Mod assist General transfer comment: stood with RW and min to mod A of 2, pt limited by RUE knee pain. worked on lateral scooting  to head of bed with mod assist of bed pad.     Balance Overall balance assessment: Needs assistance Sitting-balance support: Single extremity supported;No upper extremity supported Sitting balance-Leahy Scale: Fair       Standing balance-Leahy Scale: Zero                             ADL either performed or assessed with clinical judgement   ADL Overall ADL's : Needs assistance/impaired Eating/Feeding: Minimal assistance;Sitting   Grooming: Minimal assistance;Bed level;Sitting Grooming Details (indicate cue type and reason): intermittent assit to support R elbow during grooming tasks Upper Body Bathing: Minimal assistance;Sitting   Lower Body Bathing: Moderate assistance;+2 for physical assistance;+2 for safety/equipment;Sit to/from stand   Upper Body Dressing : Moderate assistance;Sitting   Lower Body Dressing: Maximal assistance;Total assistance;+2 for physical assistance;+2 for safety/equipment;Sit to/from stand Lower Body Dressing Details (indicate cue type and reason): +2 assist for standing balance             Functional mobility during ADLs: Moderate assistance;+2 for physical assistance;+2 for safety/equipment;Rolling walker                           Pertinent Vitals/Pain Pain Assessment: Faces Faces Pain Scale: Hurts a little bit Pain Location: RUE  Pain Descriptors / Indicators: Discomfort Pain Intervention(s): Monitored during session;Limited activity within patient's tolerance;Heat applied     Hand Dominance Right   Extremity/Trunk Assessment Upper Extremity Assessment Upper Extremity Assessment: Generalized weakness   Lower Extremity Assessment Lower Extremity Assessment: Defer to PT evaluation  Communication Communication Communication: Expressive difficulties(soft spoken)   Cognition Arousal/Alertness: Awake/alert Behavior During Therapy: Restless;Flat affect Overall Cognitive Status: Impaired/Different from  baseline Area of Impairment: Attention;Following commands;Safety/judgement                       Following Commands: Follows one step commands with increased time;Follows one step commands inconsistently;Follows multi-step commands inconsistently;Follows multi-step commands with increased time Safety/Judgement: Decreased awareness of safety     General Comments: AOx4, with slow processing and flat affect. following commands 75% of session.    General Comments  VSS this session    Exercises     Shoulder Instructions      Home Living Family/patient expects to be discharged to:: Private residence Living Arrangements: Other relatives Available Help at Discharge: Family;Available 24 hours/day Type of Home: Apartment Home Access: Stairs to enter [employer] of Steps: 2 Entrance Stairs-Rails: Right;Left Home Layout: One level               Home Equipment: None   Additional Comments: unsure of accuracy of report. 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        OT Problem List: Decreased strength;Decreased range of motion;Decreased activity tolerance;Impaired balance (sitting and/or standing);Decreased cognition;Decreased safety awareness;Decreased knowledge of use of DME or AE;Impaired UE functional use      OT Treatment/Interventions: Self-care/ADL training;Therapeutic exercise;Energy conservation;DME and/or AE instruction;Therapeutic activities;Patient/family education;Balance training    OT Goals(Current goals can be found in the care plan section) Acute Rehab OT Goals Patient Stated Goal: get back to PLOF OT Goal Formulation: With patient Time For Goal Achievement: 02/04/19 Potential to Achieve Goals: Good  OT Frequency: Min 2X/week   Barriers to D/C:            Co-evaluation  PT/OT/SLP Co-Evaluation/Treatment: (overlap with PT for transfer assessment)            AM-PAC OT "6 Clicks" Daily Activity     Outcome Measure Help from another person eating meals?: A Lot Help from another person taking care of personal grooming?: A Lot Help from another person toileting, which includes using toliet, bedpan, or urinal?: Total Help from another person bathing (including washing, rinsing, drying)?: A Lot Help from another person to put on and taking off regular upper body clothing?: A Lot Help from another person to put on and taking off regular lower body clothing?: Total 6 Click Score: 10   End of Session Equipment Utilized During Treatment: Oxygen(3L) Nurse Communication: Mobility status  Activity Tolerance: Patient tolerated treatment well Patient left: in bed;with call bell/phone within reach  OT Visit Diagnosis: Unsteadiness on feet (R26.81);Muscle weakness (generalized) (M62.81)                Time: 9798-9211 OT Time Calculation (min): 24 min Charges:  OT General Charges $OT Visit: 1 Visit OT Evaluation $OT Eval Moderate Complexity: 1 Mod  Lou Cal, OT [employer] Pager [phone] Office [phone]   Raymondo Band 01/21/2019, 5:01 PM

## 2019-01-21 NOTE — Progress Notes (Addendum)
NAME:  Frederick SizerRobert Moody, MRN:  [id number], DOB:  09-Apr-1956, LOS: 14 ADMISSION DATE:  01/07/2019, CONSULTATION DATE:  01/11/19 CHIEF COMPLAINT:  Encephaolpathy   Brief History    63 yo with history of uncontrolled DM and EtOH abuse who presented for weakness and falls. Found to have etoh withdrawal and DKA (resolved) and MSSA bacteremia likely source is abscess on upper back. Patient was admitted to CCM after being unable to protect his airway, thought to be due to excess benzodiazepenes due to his withdrawal.   Past Medical History  Poorly controlled DM, ETOH abuse  Principal Problem:   MSSA bacteremia Active Problems:   DKA (diabetic ketoacidoses) (HCC)   Alcohol withdrawal (HCC)   Aspiration pneumonia (HCC)   Sepsis (HCC)   Acute encephalopathy   Acute respiratory failure (HCC)   Malnutrition of moderate degree   Hypokalemia   Pressure injury of skin   Significant Hospital Events   01/07/19 - admitted for MSSA positive  01/11/19 - intubated and transferred to MICU 01/12/19 - OR for I & D of upper back   Consults:  ID  Surgery  Nutrition  Cardiology   Procedures:  01/11/2019-intubation 01/11/19 - Central Line 7/20 - OR for abscess drain  7/20 - I/O, foley placement 7/27- re-intubated    Significant Diagnostic Tests:  7/20 MRI Elbow >> severe myositis, small fluid collection, subcutaneous swelling  7/20 Ab XR Gastric tube courses into the stomach with its tip in the antropyloric region, appropriately positioned.  Ct Head Wo Contrast 7/20 CT Head w/o - no acute findings, remote infarcts, chronic axillary sinusitis 7/20 CT chest W/O-moderate layering pleural effusions with multisegmental atelectasis.  Dorsal cervical thoracic soft tissue inflammation 7/20/CT spine 11 x 5 x 6 cm abscess in the dorsal cervicothoracic soft tissues extending from the skin surface into the superficial musculature. There is extension to spinous processes without bony erosion.  7/17 TTE -normal  systolic function with EF 55 to 60%.  Left atrium mildly dilated.  Mild mitral annular calcification present.  No stenosis of aortic valve but with mild thickening and calcification.  7/17 UE U/S Focal area of soft tissue edema with surrounding Doppler flow in the medial aspect of left elbow which appears to be centered in the flexor musculature concerning for myositis. If there is further clinical concern regarding septic arthritis, recommend an MRI of the left elbow. .   7/22 left upper extremity MRI -forearm: Extensive muscle and subcutaneous edema visualized throughout the left arm consistent with cellulitis and myositis.  Humerus: Inflammation spreads to shoulder girdle dural and left upper arm and left lateral chest consistent with severe myositis.  Micro Data:  C. difficile PCR negative MRSA negative 01/07/19 - blood culture >> + MSSA (Resistant to cipro, clinda, erythro) 01/07/19 - Urine cx >> + staph aureus pan senstive (Resistant to cipro)  01/09/19 - Blood culture >> NG 01/10/19 - Urine Culture >> NG 01/12/19 - Blood culture >> NG  01/12/19 - Abscess culture >> staph aureus resistant to Cipro 01/12/19 -trach aspirate: Stenotrophomonas final  Antimicrobials:  7/20 Unasyn >>  7/16-17, 7/20 Vanc 7/16> Ceftriaxone 7/16 > Ancef 7/15-7/16> Cefepime 7/27 Levofloxacin>> 7/26 Cefazolin>>  Interim history/subjective:  On PSV 5/5. No acute events overnight. On low-dose Precedex. Good urine output. Afebrile.  Objective   Temp:  [97.4 F (36.3 C)-100.2 F (37.9 C)] 97.4 F (36.3 C) (07/29 0800) Pulse Rate:  [56-86] 58 (07/29 0727) Resp:  [15-26] 16 (07/29 0727) BP: (115-159)/(64-110) 136/64 (07/29 0600) SpO2:  [  100 %] 100 % (07/29 0727) FiO2 (%):  [40 %] 40 % (07/29 0727) Weight:  [86 kg] 86 kg (07/29 0500)     Vent Mode: PSV;CPAP FiO2 (%):  [40 %] 40 % Set Rate:  [15 bmp] 15 bmp Vt Set:  [560 mL] 560 mL PEEP:  [5 cmH20] 5 cmH20 Pressure Support:  [5 cmH20-12 cmH20] 5 cmH20  Plateau Pressure:  [13 cmH20-19 cmH20] 13 cmH20  Intake/Output      07/28 0701 - 07/29 0700 07/29 0701 - 07/30 0700   I.V. (mL/kg) 178.9 (2.1)    NG/GT 1320    IV Piggyback 550.1    Total Intake(mL/kg) 2048.9 (23.8)    Urine (mL/kg/hr) 650 (0.3)    Stool 200    Total Output 850    Net +1198.9            Filed Weights   01/19/19 1046 01/20/19 0500 01/21/19 0500  Weight: 85.7 kg 86.1 kg 86 kg   Examination: General: Well developed, chronically ill appearing male, NAD on vent  HEENT: Normocephalic, PERRL.  Mucous membranes Lungs: BBS clear, full, nonlabored.  On pressure support ventilation tolerating well. Cardiovascular: RRR,S1S2. No MRG Abdomen: Bowel sounds x4, soft, NT, ND Extremities: 1+ edema to upper extremities, 2+ edema to shins to lower extremities Neuro: sedated, RASS -1, on low dose precedex GU: Foley cath in place   Assessment & Plan:  MSSA Bacteremia likely due to soft tissue abscess on upper back, s/p drainage in the OR. No TEE per cards. Per ID, patient will need long course of ancef as patient is not getting TEE. WBC normal.  Afebrile.  Unasyn complete. PLAN -  -Cefazolin per ID  Pneumonia PLAN -Levofloxacin per ID for steno pneumonia  Acute hypoxic respiratory failure - reintubated 7/27 r/t AMS and inability to protect airway. C/b pulm edema +/- aspiration PNA.   PLAN -  Continue Vent support - 8cc/kg  F/u CXR  F/u ABG Daily SBT  Will need speech eval   AMS - Possible ETOH withdrawal.  Day 13 of hospitalization.  Likely now out side of window for acute withdrawal. Sodium normalized. PLAN -  In addition to clonidine to facilitate weaning precedex to off as able.  Consider neuro imaging if mental status does not improve  Supportive care  PRN ativan To need thiamine, multivitamin, folic acid Continue Zyprexa  Hypernatremia-resolved PLAN -  -Continue Free water 350 q4h for now -Follow with renal panel  Severe Myositis & cellulitis of LUE   Orthopedic surgery consulted and recommend supportive measures with no drainable fluid at this time.  They have signed off.  CK wnl PLAN  - Monitor    Diabetes - A1c 11.7.   PLAN -  Continue SSI  Continue Levemir   Chronic anemia  PLAN -  Monitor on lovenox  F/u CBC   Nutrition  PLAN -  Hold TF for now - weaning, may be able to attempt extubation again in next 24h Needs swallow eval once extubated     Best practice:  Diet: NPO.  Tube feeds per Rx as appropriate pending ability to liberate from vent Pain/Anxiety/Delirium protocol (if indicated): fentaNYL (SUBLIMAZE) injection, ibuprofen, lip balm, LORazepam, polyvinyl alcohol  VAP protocol (if indicated): No DVT prophylaxis: LOVENOX GI prophylaxis: Famotidine Glucose control: levemir, SSI  Mobility: BR Code Status: Full  Family Communication: Sister daily  Disposition: ICU   I spent 45 minutes in direct patient care including reviewing data,  discussing with other providers, assessment,  planning and stabilization and documentation. Time is exclusive to this patient and does not include procedures.   Francine Graven, MSN, AGACNP  Pager [phone] or if no answer [phone] [facility] Pulmonary & Critical Care  Attending Note:  63 year old male with etoh abuse history who presents with DKA and subsequent MSSA bacteremia that is now severely altered.  Weaning this AM but altered mental status and coarse BS diffusely coarse.  I reviewed CXR myself, ETT is in place.  Discussed with PCCM-NP.  Family unable to face time with the patient but I spoke with the sister over the phone.  They are unable to make a decision but they are unable to make decision about reintubation or trach.  Will hold off extubation until we hear from the family.  The patient is critically ill with multiple organ systems failure and requires high complexity decision making for assessment and support, frequent evaluation and titration of therapies,  application of advanced monitoring technologies and extensive interpretation of multiple databases.   Critical Care Time devoted to patient care services described in this note is  32  Minutes. This time reflects time of care of this signee Dr Jennet Maduro. This critical care time does not reflect procedure time, or teaching time or supervisory time of PA/NP/Med student/Med Resident etc but could involve care discussion time.  Rush Farmer, M.D. [facility] Pulmonary/Critical Care Medicine. Pager: [phone]. After hours pager: [phone].

## 2019-01-21 NOTE — Progress Notes (Signed)
Physical Therapy Treatment Patient Details Name: Frederick SizerRobert Moody MRN: [id number] DOB: 1956-06-08 Today's Date: 01/21/2019    History of Present Illness Pt is a 63 yo male with hx of uncontrolled DM and EtOH abuse who presents with weakness and falls. Pt admitted with DKA, sepsis with MSSA bacteremia and abscess on back (s/p I&D 7/21), AMS with EtOH withdrawal and possible benzo OD and aspiration requiring intubation 7/19-7/24. Re-intubated 7/27-7/29.     PT Comments    Patient seen after re-extubation, AOx4 with flat affect and slow processing, following 75% of commands during visit. Limited in standing due to RLE pain at knee, mod A for mobility at this time.  Able to roll and lateral scoot in bed with assistance. VSS on 3L throughout session.  Continue to rec CIR.   Follow Up Recommendations  CIR     Equipment Recommendations  (TBD)    Recommendations for Other Services Rehab consult     Precautions / Restrictions Precautions Precautions: Fall Restrictions Weight Bearing Restrictions: No    Mobility  Bed Mobility Overal bed mobility: Needs Assistance Bed Mobility: Supine to Sit;Sit to Supine     Supine to sit: Mod assist Sit to supine: Mod assist   General bed mobility comments: posterior lean sitting EOB , able to self correct at times with cues.   Transfers Overall transfer level: Needs assistance Equipment used: Rolling walker (2 wheeled) Transfers: Sit to/from Stand;Lateral/Scoot Transfers Sit to Stand: Mod assist;+2 physical assistance        Lateral/Scoot Transfers: +2 physical assistance;Mod assist General transfer comment: stood with RW and min to mod A of 2, pt limited by RUE knee pain. worked on lateral scooting to head of bed with mod assist of bed pad.   Ambulation/Gait             General Gait Details: unable today   Stairs             Wheelchair Mobility    Modified Rankin (Stroke Patients Only)       Balance Overall  balance assessment: Needs assistance Sitting-balance support: Single extremity supported;No upper extremity supported Sitting balance-Leahy Scale: Fair       Standing balance-Leahy Scale: Zero                              Cognition Arousal/Alertness: Awake/alert Behavior During Therapy: Restless;Flat affect Overall Cognitive Status: Impaired/Different from baseline Area of Impairment: Attention;Following commands;Safety/judgement                       Following Commands: Follows one step commands with increased time;Follows one step commands inconsistently;Follows multi-step commands inconsistently;Follows multi-step commands with increased time Safety/Judgement: Decreased awareness of safety     General Comments: AOx4, with slow processing and flat affect. following commands 75% of session.       Exercises      General Comments General comments (skin integrity, edema, etc.): pt states he lives on house, with his brother, with a ramp, has DME at home. (differing from his eval with PT Rocky LinkKen 7/24). reports indepednence with all moblity and was talking care of his brother      Pertinent Vitals/Pain Pain Assessment: Faces Faces Pain Scale: Hurts a little bit Pain Location: RUE  Pain Descriptors / Indicators: Discomfort Pain Intervention(s): Limited activity within patient's tolerance    Home Living  Prior Function            PT Goals (current goals can now be found in the care plan section) Acute Rehab PT Goals PT Goal Formulation: With patient Time For Goal Achievement: 01/31/19 Potential to Achieve Goals: Fair Progress towards PT goals: Progressing toward goals    Frequency    Min 3X/week      PT Plan Current plan remains appropriate    Co-evaluation              AM-PAC PT "6 Clicks" Mobility   Outcome Measure  Help needed turning from your back to your side while in a flat bed without using  bedrails?: A Lot Help needed moving from lying on your back to sitting on the side of a flat bed without using bedrails?: A Lot Help needed moving to and from a bed to a chair (including a wheelchair)?: A Lot Help needed standing up from a chair using your arms (e.g., wheelchair or bedside chair)?: A Lot Help needed to walk in hospital room?: Total Help needed climbing 3-5 steps with a railing? : Total 6 Click Score: 10    End of Session Equipment Utilized During Treatment: Gait belt;Oxygen Activity Tolerance: Patient tolerated treatment well;Patient limited by fatigue Patient left: in bed;with call bell/phone within reach;with restraints reapplied Nurse Communication: Mobility status PT Visit Diagnosis: Other abnormalities of gait and mobility (R26.89);Muscle weakness (generalized) (M62.81);Difficulty in walking, not elsewhere classified (R26.2)     Time: 1534-1600 PT Time Calculation (min) (ACUTE ONLY): 26 min  Charges:  $Therapeutic Activity: 23-37 mins                    Reinaldo Berber, PT, DPT Acute Rehabilitation Services Pager: [phone] Office: [phone]   Reinaldo Berber 01/21/2019, 4:13 PM

## 2019-01-21 NOTE — Progress Notes (Signed)
[facility]   Reason for visit: Follow up on MSSA bacteremia  Interval History: On day 3 of levaquin, day 3 of cefazolin, day 10 total of antibiotics. Patient with Tmax of 100.2 over past 24 hours, last febrile to 101.1 @ 1200 on 7/27. Patient remains intubated this AM but movies extremities to command and shakes his head to questions. Patient denies any chest pain, abdominal pain, or any discomfort.  Physical Exam: Constitutional: Remains intubated, appears in NAD Vitals:   01/21/19 1147 01/21/19 1240  BP:    Pulse:  72  Resp:  (!) 25  Temp: 99.1 F (37.3 C)   SpO2:  100%  Respiratory: Respiratory effort on vent, lungs clear on auscultation - lung exam limited by patient position Cardiovascular: RRR GI: soft, nt, nd  Review of Systems: Review of Systems  Constitutional: Negative for fever.  Cardiovascular: Negative for chest pain.  Gastrointestinal: Negative for abdominal pain.     Lab Results  Component Value Date   WBC 7.3 01/21/2019   HGB 8.2 (L) 01/21/2019   HCT 25.4 (L) 01/21/2019   MCV 100.0 01/21/2019   PLT 133 (L) 01/21/2019    Lab Results  Component Value Date   CREATININE 0.80 01/21/2019   BUN 24 (H) 01/21/2019   NA 144 01/21/2019   K 3.0 (L) 01/21/2019   CL 108 01/21/2019   CO2 30 01/21/2019    Lab Results  Component Value Date   ALT 32 01/18/2019   AST 57 (H) 01/18/2019   ALKPHOS 89 01/18/2019     Microbiology: Recent Results (from the past 240 hour(s))  C difficile quick scan w PCR reflex     Status: None   Collection Time: 01/11/19  3:44 PM   Specimen: STOOL  Result Value Ref Range Status   C Diff antigen NEGATIVE NEGATIVE Final   C Diff toxin NEGATIVE NEGATIVE Final   C Diff interpretation No C. difficile detected.  Final    Comment: Performed at [facility], [address] [place], [place] [address]  MRSA PCR Screening     Status: None   Collection Time: 01/11/19 11:42 PM   Specimen: Nasal Mucosa;  Nasopharyngeal  Result Value Ref Range Status   MRSA by PCR NEGATIVE NEGATIVE Final    Comment:        The GeneXpert MRSA Assay (FDA approved for NASAL specimens only), is one component of a comprehensive MRSA colonization surveillance program. It is not intended to diagnose MRSA infection nor to guide or monitor treatment for MRSA infections. Performed at [facility], [address] [place], [place] [address]   Culture, blood (routine x 2)     Status: None   Collection Time: 01/12/19  1:43 AM   Specimen: BLOOD LEFT HAND  Result Value Ref Range Status   Specimen Description BLOOD LEFT HAND  Final   Special Requests   Final    BOTTLES DRAWN AEROBIC ONLY Blood Culture results may not be optimal due to an inadequate volume of blood received in culture bottles   Culture   Final    NO GROWTH 5 DAYS Performed at [facility], [address] [place], [place] [address]    Report Status 01/17/2019 FINAL  Final  Culture, blood (routine x 2)     Status: None   Collection Time: 01/12/19  1:50 AM   Specimen: BLOOD RIGHT HAND  Result Value Ref Range Status   Specimen Description  BLOOD RIGHT HAND  Final   Special Requests   Final    BOTTLES DRAWN AEROBIC ONLY Blood Culture adequate volume   Culture   Final    NO GROWTH 5 DAYS Performed at [facility], [address] [place], [place] [address]    Report Status 01/17/2019 FINAL  Final  Culture, respiratory (non-expectorated)     Status: None   Collection Time: 01/12/19 12:47 PM   Specimen: Tracheal Aspirate; Respiratory  Result Value Ref Range Status   Specimen Description TRACHEAL ASPIRATE  Final   Special Requests NONE  Final   Gram Stain   Final    RARE WBC PRESENT, PREDOMINANTLY PMN NO ORGANISMS SEEN Performed at [facility], [place] [address], [place], [place] [address]    Culture FEW STENOTROPHOMONAS MALTOPHILIA  Final   Report Status 01/15/2019 FINAL  Final   Organism ID, Bacteria STENOTROPHOMONAS  MALTOPHILIA  Final      Susceptibility   Stenotrophomonas maltophilia - MIC*    LEVOFLOXACIN 2 SENSITIVE Sensitive     TRIMETH/SULFA 80 RESISTANT Resistant     * FEW STENOTROPHOMONAS MALTOPHILIA  Surgical PCR screen     Status: Abnormal   Collection Time: 01/13/19  9:00 AM   Specimen: Nasal Mucosa; Nasal Swab  Result Value Ref Range Status   MRSA, PCR NEGATIVE NEGATIVE Final   Staphylococcus aureus POSITIVE (A) NEGATIVE Final    Comment: (NOTE) The Xpert SA Assay (FDA approved for NASAL specimens in patients 71 years of age and older), is one component of a comprehensive surveillance program. It is not intended to diagnose infection nor to guide or monitor treatment. Performed at [facility], [place] [address], [place], [place] [address]   Aerobic/Anaerobic Culture (surgical/deep wound)     Status: None   Collection Time: 01/13/19 12:37 PM   Specimen: Abscess  Result Value Ref Range Status   Specimen Description ABSCESS UPPER BACK  Final   Special Requests NONE  Final   Gram Stain NO WBC SEEN FEW GRAM POSITIVE COCCI IN PAIRS   Final   Culture   Final    MODERATE STAPHYLOCOCCUS AUREUS NO ANAEROBES ISOLATED Performed at [facility], [address], [place],  [address]    Report Status 01/18/2019 FINAL  Final   Organism ID, Bacteria STAPHYLOCOCCUS AUREUS  Final      Susceptibility   Staphylococcus aureus - MIC*    CIPROFLOXACIN >=8 RESISTANT Resistant     ERYTHROMYCIN <=0.25 SENSITIVE Sensitive     GENTAMICIN <=0.5 SENSITIVE Sensitive     OXACILLIN 0.5 SENSITIVE Sensitive     TETRACYCLINE <=1 SENSITIVE Sensitive     VANCOMYCIN <=0.5 SENSITIVE Sensitive     TRIMETH/SULFA <=10 SENSITIVE Sensitive     CLINDAMYCIN <=0.25 SENSITIVE Sensitive     RIFAMPIN <=0.5 SENSITIVE Sensitive     Inducible Clindamycin NEGATIVE Sensitive     * MODERATE STAPHYLOCOCCUS AUREUS    Impression/Plan:  1. MSSA bacteremia - on cefazolin and will need prolonged course 2.  Abscess s/p debridement. Treat with prolonged IV therapy as above 3. Pneumonia - on levaquin for broader coverage to include stenotrophomonas identified on tracheal aspirate.

## 2019-01-22 ENCOUNTER — Inpatient Hospital Stay: Payer: Self-pay

## 2019-01-22 LAB — BASIC METABOLIC PANEL
Anion gap: 6 (ref 5–15)
BUN: 28 mg/dL — ABNORMAL HIGH (ref 8–23)
CO2: 30 mmol/L (ref 22–32)
Calcium: 8 mg/dL — ABNORMAL LOW (ref 8.9–10.3)
Chloride: 103 mmol/L (ref 98–111)
Creatinine, Ser: 1.04 mg/dL (ref 0.61–1.24)
GFR calc Af Amer: >60 mL/min (ref >60)
GFR calc non Af Amer: >60 mL/min (ref >60)
Glucose, Bld: 166 mg/dL — ABNORMAL HIGH (ref 70–99)
Potassium: 3.8 mmol/L (ref 3.5–5.1)
Sodium: 139 mmol/L (ref 135–145)

## 2019-01-22 LAB — GLUCOSE, CAPILLARY
Glucose-Capillary: 149 mg/dL — ABNORMAL HIGH (ref 70–99)
Glucose-Capillary: 151 mg/dL — ABNORMAL HIGH (ref 70–99)
Glucose-Capillary: 168 mg/dL — ABNORMAL HIGH (ref 70–99)
Glucose-Capillary: 170 mg/dL — ABNORMAL HIGH (ref 70–99)
Glucose-Capillary: 172 mg/dL — ABNORMAL HIGH (ref 70–99)
Glucose-Capillary: 183 mg/dL — ABNORMAL HIGH (ref 70–99)
Glucose-Capillary: 196 mg/dL — ABNORMAL HIGH (ref 70–99)

## 2019-01-22 MED ORDER — CLONIDINE HCL 0.1 MG PO TABS
0.1000 mg | ORAL_TABLET | Freq: Four times a day (QID) | ORAL | Status: AC
  Administered 2019-01-22 – 2019-01-23 (×3): 0.1 mg via ORAL
  Filled 2019-01-22 (×3): qty 1

## 2019-01-22 MED ORDER — CLONIDINE HCL 0.1 MG PO TABS
0.1000 mg | ORAL_TABLET | Freq: Two times a day (BID) | ORAL | Status: AC
  Administered 2019-01-24 (×2): 0.1 mg
  Filled 2019-01-22 (×2): qty 1

## 2019-01-22 MED ORDER — ALBUMIN HUMAN 25 % IV SOLN
25.0000 g | Freq: Four times a day (QID) | INTRAVENOUS | Status: AC
  Administered 2019-01-22 – 2019-01-23 (×4): 25 g via INTRAVENOUS
  Filled 2019-01-22 (×3): qty 100
  Filled 2019-01-22 (×2): qty 50

## 2019-01-22 MED ORDER — NON FORMULARY: 3.0000 mg | Freq: Every evening | Status: DC | PRN

## 2019-01-22 MED ORDER — FUROSEMIDE 10 MG/ML IJ SOLN
40.0000 mg | Freq: Once | INTRAMUSCULAR | Status: AC
  Administered 2019-01-22: 12:00:00 40 mg via INTRAVENOUS
  Filled 2019-01-22: qty 4

## 2019-01-22 MED ORDER — POTASSIUM CHLORIDE 20 MEQ/15ML (10%) PO SOLN
40.0000 meq | Freq: Every day | ORAL | Status: DC
  Administered 2019-01-22: 12:00:00 40 meq via ORAL
  Filled 2019-01-22: qty 30

## 2019-01-22 MED ORDER — MELATONIN 3 MG PO TABS
3.0000 mg | ORAL_TABLET | Freq: Every evening | ORAL | Status: DC | PRN
  Administered 2019-01-23 – 2019-01-25 (×2): 3 mg
  Filled 2019-01-22 (×3): qty 1

## 2019-01-22 MED ORDER — GUAIFENESIN 100 MG/5ML PO SOLN
15.0000 mL | Freq: Four times a day (QID) | ORAL | Status: AC
  Administered 2019-01-22 – 2019-01-24 (×7): 300 mg
  Filled 2019-01-22 (×3): qty 15
  Filled 2019-01-22 (×2): qty 45
  Filled 2019-01-22 (×2): qty 15

## 2019-01-22 MED ORDER — CLONIDINE HCL 0.1 MG PO TABS
0.1000 mg | ORAL_TABLET | Freq: Every day | ORAL | Status: AC
  Administered 2019-01-25: 0.1 mg via ORAL
  Filled 2019-01-22: qty 1

## 2019-01-22 MED ORDER — MAGNESIUM SULFATE 2 GM/50ML IV SOLN
2.0000 g | Freq: Once | INTRAVENOUS | Status: AC
  Administered 2019-01-22: 11:00:00 2 g via INTRAVENOUS
  Filled 2019-01-22: qty 50

## 2019-01-22 MED ORDER — FREE WATER: 150.0000 mL | Status: DC

## 2019-01-22 NOTE — Progress Notes (Signed)
  Speech Language Pathology Treatment: Dysphagia  Patient Details Name: Frederick Moody MRN: [id number] DOB: February 08, 1956 Today's Date: 01/22/2019 Time: 1230-1300 SLP Time Calculation (min) (ACUTE ONLY): 30 min  Assessment / Plan / Recommendation Clinical Impression  Pt was seen for skilled ST intervention targeting established goals for po readiness. Oral care was completed with suction. Pt exhibits low vocal intensity and occasional wet voice quality, with weak nonproductive volitional cough. NGT in place, which tends to impede epiglottic inversion and subsequently pharyngeal clearing and/or airway protection. Pt accepted trials of individual ice chips, then puree. No overt s/s aspiration observed on these trials, however, delayed swallow reflex is suspected. Pt did not pass the 3oz water challenge, due to immediate cough response.  Several minutes after po trials had been discontinued, pt continued to exhibit non-productive cough. At this time, given bedside presentation, occasional ice chips after thorough oral care is allowed. Otherwise, NPO status for now. Recommend MBS prior to liberalization of po intake due to increased aspiration risk, possibly tomorrow. SLP will follow up at bedside next date to determine timing of MBS, based on clinical presentation. RN informed.    HPI HPI: Pt is a 63 yo male with hx of uncontrolled DM and EtOH abuse who presents with weakness and falls. Pt admitted with DKA, sepsis with MSSA bacteremia and abscess on back (s/p I&D 7/21), AMS with EtOH withdrawal and possible benzo OD and aspiration requiring intubation 7/19-7/24.       SLP Plan  MBS;New goals to be determined pending instrumental study  MBS possibly Friday 01/23/2019.       Recommendations  Diet recommendations: Other(comment)(ice chips after oral care) Medication Administration: Via alternative means                Oral Care Recommendations: Oral care QID;Staff/trained caregiver to provide  oral care Follow up Recommendations: Other (comment)(TBD) SLP Visit Diagnosis: Dysphagia, unspecified (R13.10) Plan: MBS;New goals to be determined pending instrumental study       Frederick Moody [facility], CCC-SLP Speech Language Pathologist [phone]  Shonna Chock 01/22/2019, 1:02 PM

## 2019-01-22 NOTE — Progress Notes (Signed)
Spoke with Lilia Pro RN re PICC line order and aware to d/c CVC once PICC inserted.

## 2019-01-22 NOTE — Progress Notes (Signed)
Peripherally Inserted Central Catheter/Midline Placement  The IV Nurse has discussed with the patient and/or persons authorized to consent for the patient, the purpose of this procedure and the potential benefits and risks involved with this procedure.  The benefits include less needle sticks, lab draws from the catheter, and the patient may be discharged home with the catheter. Risks include, but not limited to, infection, bleeding, blood clot (thrombus formation), and puncture of an artery; nerve damage and irregular heartbeat and possibility to perform a PICC exchange if needed/ordered by physician.  Alternatives to this procedure were also discussed.  Bard Power PICC patient education guide, fact sheet on infection prevention and patient information card has been provided to patient /or left at bedside.    PICC/Midline Placement Documentation  PICC Double Lumen 00/71/21 PICC Right Basilic 43 cm 0 cm (Active)  Indication for Insertion or Continuance of Line Vasoactive infusions 01/22/19 1300  Exposed Catheter (cm) 0 cm 01/22/19 1300  Site Assessment Clean;Dry;Intact 01/22/19 1300  Lumen #1 Status Flushed;Saline locked;Blood return noted 01/22/19 1300  Lumen #2 Status Flushed;Saline locked;Blood return noted 01/22/19 1300  Dressing Type Transparent;Securing device 01/22/19 1300  Dressing Status Clean;Dry;Intact;Antimicrobial disc in place 01/22/19 [place] checked and tightened 01/22/19 1300  Dressing Intervention New dressing 01/22/19 1300  Dressing Change Due 01/29/19 01/22/19 1300       Virgilio Belling 01/22/2019, 1:35 PM

## 2019-01-22 NOTE — Progress Notes (Signed)
eLink Physician-Brief Progress Note Patient Name: Frederick Moody DOB: 1955-12-31 MRN: [id number]   Date of Service  01/22/2019  HPI/Events of Note  Patient requests sleep aid.   eICU Interventions  Will have pharmacy enter an order for Melatonin.     Intervention Category Major Interventions: Other:  [name] Cornelia Copa 01/22/2019, 9:07 PM

## 2019-01-22 NOTE — Progress Notes (Addendum)
NAME:  Frederick Moody, MRN:  [id number], DOB:  05/22/1956, LOS: 31 ADMISSION DATE:  01/07/2019, CONSULTATION DATE:  01/11/19 CHIEF COMPLAINT:  Encephaolpathy   Brief History    63 yo with history of uncontrolled DM and EtOH abuse who presented for weakness and falls. Found to have etoh withdrawal and DKA (resolved) and MSSA bacteremia likely source is abscess on upper back. Patient was admitted to CCM after being unable to protect his airway, thought to be due to excess benzodiazepenes due to his withdrawal.   Past Medical History  Poorly controlled DM, ETOH abuse  Principal Problem:   MSSA bacteremia Active Problems:   DKA (diabetic ketoacidoses) ([place])   Alcohol withdrawal ([place])   Aspiration pneumonia ([place])   Sepsis ([place])   Acute encephalopathy   Acute respiratory failure ([place])   Malnutrition of moderate degree   Hypokalemia   Pressure injury of skin   Significant Hospital Events   01/07/19 - admitted for MSSA positive  01/11/19 - intubated and transferred to MICU 01/12/19 - OR for I & D of upper back   Consults:  ID  Surgery  Nutrition  Cardiology   Procedures:  01/11/2019-intubation 01/11/19 - Central Line 7/20 - OR for abscess drain  7/20 - I/O, foley placement 7/27- re-intubated>>7/29   Significant Diagnostic Tests:  7/20 MRI Elbow >> severe myositis, small fluid collection, subcutaneous swelling  7/20 Ab XR Gastric tube courses into the stomach with its tip in the antropyloric region, appropriately positioned.  Ct Head Wo Contrast 7/20 CT Head w/o - no acute findings, remote infarcts, chronic axillary sinusitis 7/20 CT chest W/O-moderate layering pleural effusions with multisegmental atelectasis.  Dorsal cervical thoracic soft tissue inflammation 7/20/CT spine 11 x 5 x 6 cm abscess in the dorsal cervicothoracic soft tissues extending from the skin surface into the superficial musculature. There is extension to spinous processes without bony erosion.  7/17 TTE  -normal systolic function with EF 55 to 60%.  Left atrium mildly dilated.  Mild mitral annular calcification present.  No stenosis of aortic valve but with mild thickening and calcification.  7/17 UE U/S Focal area of soft tissue edema with surrounding Doppler flow in the medial aspect of left elbow which appears to be centered in the flexor musculature concerning for myositis. If there is further clinical concern regarding septic arthritis, recommend an MRI of the left elbow. .   7/22 left upper extremity MRI -forearm: Extensive muscle and subcutaneous edema visualized throughout the left arm consistent with cellulitis and myositis.  Humerus: Inflammation spreads to shoulder girdle dural and left upper arm and left lateral chest consistent with severe myositis.  Micro Data:  C. difficile PCR negative MRSA negative 01/07/19 - blood culture >> + MSSA (Resistant to cipro, clinda, erythro) 01/07/19 - Urine cx >> + staph aureus pan senstive (Resistant to cipro)  01/09/19 - Blood culture >> Neg 01/10/19 - Urine Culture >> Neg 01/12/19 - Blood culture >> Neg  01/12/19 - Abscess culture >> staph aureus resistant to Cipro 01/12/19 -trach aspirate: Stenotrophomonas final  Antimicrobials:  7/20 Unasyn >> 7/26 7/16-17, 7/20 Vanc 7/16> Ceftriaxone 7/16 > Ancef 7/15-7/16> Cefepime 7/27 Levofloxacin>> 7/26 Cefazolin>>  Interim history/subjective:  Extubated yesterday.  Decent cough.  Alert, following commands.  Good urine output. Afebrile. C/o mouth being dry.   Objective   Temp:  [97.6 F (36.4 C)-99.9 F (37.7 C)] 98.3 F (36.8 C) (07/30 0755) Pulse Rate:  [59-101] 101 (07/30 0700) Resp:  [17-33] 32 (07/30 0700) BP: (108-155)/(54-78)  114/71 (07/30 0700) SpO2:  [95 %-100 %] 99 % (07/30 0700) FiO2 (%):  [40 %] 40 % (07/29 1200) Weight:  [91 kg] 91 kg (07/30 0446)     FiO2 (%):  [40 %] 40 %  Intake/Output      07/29 0701 - 07/30 0700 07/30 0701 - 07/31 0700   I.V. (mL/kg) 62.8 (0.7)     NG/GT 7695    IV Piggyback 485.6    Total Intake(mL/kg) 8243.4 (90.6)    Urine (mL/kg/hr) 990 (0.5) 175 (1.7)   Stool     Total Output 990 175   Net +7253.4 -175           Filed Weights   01/20/19 0500 01/21/19 0500 01/22/19 0446  Weight: 86.1 kg 86 kg 91 kg   Examination: General: Well developed, chronically ill appearing male, sitting up in bed, self-suctioning HEENT: Normocephalic, PERRL.  Mucous membranes moist  Lungs: BBS clear, with rhonchitic cough, full, nonlabored. SPO2 high 90s on 3lpm  Cardiovascular: RRR,S1S2. No MRG Abdomen: Bowel sounds x4, soft, NT, ND Extremities: 1+ edema to upper extremities, 2+ edema to shins to lower extremities Neuro: A&Ox3. No focal deficits. MAE symmetrically    Assessment & Plan:  MSSA Bacteremia likely due to soft tissue abscess on upper back, s/p drainage in the OR. No TEE per cards. Per ID, patient will need long course of ancef as patient is not getting TEE. WBC normal.  Afebrile.  Unasyn complete. PLAN -  -Cefazolin per ID  Pneumonia PLAN -Levofloxacin per ID for steno pneumonia  Acute hypoxic respiratory failure - reintubated 7/27 r/t AMS and inability to protect airway. C/b pulm edema +/- aspiration PNA. Extubated again 7/29. Decent cough, on 3lpm supplemental O2. Weak but clear voice   PLAN -  Aggressive pulm hygiene Mucolytic OOB  Monitor closely as he is high risk for reintubation   AMS - Possible ETOH withdrawal.  Day 14 of hospitalization.  Likely now out side of window for acute withdrawal. Sodium normalized. PLAN -  Continue clonidine Supportive care  PRN ativan Continue thiamine, multivitamin, folic acid Continue Zyprexa  Hypernatremia-resolved PLAN -  -Follow with renal panel  Severe Myositis & cellulitis of LUE  Orthopedic surgery consulted and recommend supportive measures with no drainable fluid at this time.  They have signed off.  CK wnl PLAN  - Monitor    Diabetes - A1c 11.7.   PLAN -   Continue SSI  Continue Levemir   Chronic anemia  PLAN -  Monitor on lovenox  F/u CBC   Nutrition  PLAN -  SLP following TF per recs if fails SLP    Best practice:  Diet: NPO.  Tube feeds per Rx as appropriate pending recs Pain/Anxiety/Delirium protocol (if indicated): prn VAP protocol (if indicated): No DVT prophylaxis: LOVENOX GI prophylaxis: Famotidine Glucose control: levemir, SSI  Mobility: PT/OT OOB as much as possible today  Code Status: Full  Family Communication: Sister daily  Disposition: ICU. Can likely transfer out later today if respiratory status remains stable.    I spent 35 minutes in direct patient care including reviewing data,  discussing with other providers, assessment, planning and stabilization and documentation. Time is exclusive to this patient and does not include procedures.   Karin Lieuhonda Christina Gintz, MSN, AGACNP  Pager [phone] or if no answer [phone] [facility]

## 2019-01-22 NOTE — Evaluation (Signed)
Speech Language Pathology Evaluation Patient Details Name: Frederick Moody MRN: [id number] DOB: October 13, 1955 Today's Date: 01/22/2019 Time: 7867-6720 SLP Time Calculation (min) (ACUTE ONLY): 15 min  Problem List:  Patient Active Problem List   Diagnosis Date Noted  . Pressure injury of skin 01/17/2019  . Hypokalemia   . Malnutrition of moderate degree 01/12/2019  . Acute encephalopathy   . Acute respiratory failure ([place])   . Aspiration pneumonia ([place]) 01/10/2019  . Sepsis ([place])   . MSSA bacteremia 01/08/2019  . Alcohol withdrawal ([place]) 01/08/2019  . DKA (diabetic ketoacidoses) ([place]) 01/07/2019   Past Medical History:  Past Medical History:  Diagnosis Date  . Diabetes mellitus without complication ([place])   . ETOH abuse    Past Surgical History:  Past Surgical History:  Procedure Laterality Date  . APPENDECTOMY    . IRRIGATION AND DEBRIDEMENT ABSCESS N/A 01/13/2019   Procedure: IRRIGATION AND DEBRIDEMENT OF BACK ABSCESS;  Surgeon: Donnie Mesa, MD;  Location: [facility];  Service: General;  Laterality: N/A;   HPI:  Pt is a 63 yo male with hx of uncontrolled DM and EtOH abuse who presents with weakness and falls. Pt admitted with DKA, sepsis with MSSA bacteremia and abscess on back (s/p I&D 7/21), AMS with EtOH withdrawal and possible benzo OD and aspiration requiring intubation 7/19-7/24.    Assessment / Plan / Recommendation Clinical Impression  The Mini-Mental State Examination (MMSE) was administered today. Pt scored 24/28 (Writing and figure drawing not completed this date), indicating performance within functional limits. Points were lost on attention and delayed recall. Further cognitive evaluation may be beneficial as pt approaches discharge if needs arise.    SLP Assessment  SLP Recommendation/Assessment: All further Speech Language Pathology  needs can be addressed in the next venue of care SLP Visit Diagnosis: Cognitive communication deficit (R41.841)    Follow Up  Recommendations  (reassess at next venue)          SLP Evaluation Cognition  Overall Cognitive Status: No family/caregiver present to determine baseline cognitive functioning Arousal/Alertness: Awake/alert Orientation Level: Oriented X4 Attention: Focused;Sustained Focused Attention: Appears intact Sustained Attention: Appears intact Memory: Impaired Memory Impairment: Retrieval deficit;Decreased short term memory;Decreased recall of new information       Comprehension  Auditory Comprehension Overall Auditory Comprehension: Appears within functional limits for tasks assessed    Expression Expression Primary Mode of Expression: Verbal Verbal Expression Overall Verbal Expression: Appears within functional limits for tasks assessed   Oral / Motor  Oral Motor/Sensory Function Overall Oral Motor/Sensory Function: Within functional limits Motor Speech Overall Motor Speech: Appears within functional limits for tasks assessed   GO                   Frederick Moody B. Frederick Moody Frederick Moody, CCC-SLP Speech Language Pathologist [phone]  Shonna Chock 01/22/2019, 1:53 PM

## 2019-01-23 ENCOUNTER — Inpatient Hospital Stay (HOSPITAL_COMMUNITY): Payer: Medicaid Other

## 2019-01-23 DIAGNOSIS — M7989 Other specified soft tissue disorders: Secondary | ICD-10-CM

## 2019-01-23 DIAGNOSIS — G061 Intraspinal abscess and granuloma: Secondary | ICD-10-CM

## 2019-01-23 DIAGNOSIS — R509 Fever, unspecified: Secondary | ICD-10-CM

## 2019-01-23 LAB — RENAL FUNCTION PANEL
Albumin: 2.2 g/dL — ABNORMAL LOW (ref 3.5–5.0)
Anion gap: 8 (ref 5–15)
BUN: 28 mg/dL — ABNORMAL HIGH (ref 8–23)
CO2: 29 mmol/L (ref 22–32)
Calcium: 8.9 mg/dL (ref 8.9–10.3)
Chloride: 102 mmol/L (ref 98–111)
Creatinine, Ser: 0.94 mg/dL (ref 0.61–1.24)
GFR calc Af Amer: >60 mL/min (ref >60)
GFR calc non Af Amer: >60 mL/min (ref >60)
Glucose, Bld: 162 mg/dL — ABNORMAL HIGH (ref 70–99)
Phosphorus: 4.5 mg/dL (ref 2.5–4.6)
Potassium: 3.7 mmol/L (ref 3.5–5.1)
Sodium: 139 mmol/L (ref 135–145)

## 2019-01-23 LAB — CBC
HCT: 22.3 % — ABNORMAL LOW (ref 39.0–52.0)
Hemoglobin: 7.2 g/dL — ABNORMAL LOW (ref 13.0–17.0)
MCH: 31.9 pg (ref 26.0–34.0)
MCHC: 32.3 g/dL (ref 30.0–36.0)
MCV: 98.7 fL (ref 80.0–100.0)
Platelets: 124 10*3/uL — ABNORMAL LOW (ref 150–400)
RBC: 2.26 MIL/uL — ABNORMAL LOW (ref 4.22–5.81)
RDW: 13.8 % (ref 11.5–15.5)
WBC: 6.9 10*3/uL (ref 4.0–10.5)
nRBC: 0 % (ref 0.0–0.2)

## 2019-01-23 LAB — GLUCOSE, CAPILLARY
Glucose-Capillary: 161 mg/dL — ABNORMAL HIGH (ref 70–99)
Glucose-Capillary: 202 mg/dL — ABNORMAL HIGH (ref 70–99)
Glucose-Capillary: 178 mg/dL — ABNORMAL HIGH (ref 70–99)
Glucose-Capillary: 100 mg/dL — ABNORMAL HIGH (ref 70–99)
Glucose-Capillary: 159 mg/dL — ABNORMAL HIGH (ref 70–99)

## 2019-01-23 LAB — MAGNESIUM: Magnesium: 1.9 mg/dL (ref 1.7–2.4)

## 2019-01-23 MED ORDER — IPRATROPIUM-ALBUTEROL 0.5-2.5 (3) MG/3ML IN SOLN: 3.0000 mL | Freq: Four times a day (QID) | RESPIRATORY_TRACT | Status: DC | PRN

## 2019-01-23 MED ORDER — FUROSEMIDE 10 MG/ML IJ SOLN
40.0000 mg | Freq: Once | INTRAMUSCULAR | Status: AC
  Administered 2019-01-23: 06:00:00 40 mg via INTRAVENOUS
  Filled 2019-01-23: qty 4

## 2019-01-23 MED ORDER — POTASSIUM CHLORIDE 20 MEQ PO PACK
40.0000 meq | PACK | Freq: Three times a day (TID) | ORAL | Status: AC
  Administered 2019-01-23 (×3): 40 meq via NASOGASTRIC
  Filled 2019-01-23 (×2): qty 2

## 2019-01-23 MED ORDER — FUROSEMIDE 10 MG/ML IJ SOLN
40.0000 mg | Freq: Three times a day (TID) | INTRAMUSCULAR | Status: DC
  Administered 2019-01-23 – 2019-01-27 (×12): 40 mg via INTRAVENOUS
  Filled 2019-01-23 (×13): qty 4

## 2019-01-23 MED ORDER — ENSURE ENLIVE PO LIQD
237.0000 mL | Freq: Two times a day (BID) | ORAL | Status: DC
  Administered 2019-01-23 – 2019-01-28 (×2): 237 mL via ORAL

## 2019-01-23 MED ORDER — RESOURCE THICKENUP CLEAR PO POWD
ORAL | Status: DC | PRN
  Filled 2019-01-23: qty 125

## 2019-01-23 NOTE — Progress Notes (Signed)
  Speech Language Pathology Treatment: Dysphagia  Patient Details Name: Frederick Moody MRN: [id number] DOB: [birth year] Today's Date: 01/23/2019 Time: 1700-1749 SLP Time Calculation (min) (ACUTE ONLY): 13 min  Assessment / Plan / Recommendation Clinical Impression  Treatment focused on dysphagia; NGT now out. Oral examination revealed thick phlegm posterior tongue and soft palate removed and cleaned with top layer flaky. Pt able to manipulate, transit ice chip and initiate swallow. Overt s/s aspiration were not present. He is appropriate to participate in an MBS today which is scheduled at 1330 for hopeful po recommendation.    HPI HPI: Pt is a 63 yo male with hx of uncontrolled DM and EtOH abuse who presents with weakness and falls. Pt admitted with DKA, sepsis with MSSA bacteremia and abscess on back (s/p I&D 7/21), AMS with EtOH withdrawal and possible benzo OD and aspiration requiring intubation 7/19-7/24.       SLP Plan  MBS       Recommendations  Diet recommendations: NPO Medication Administration: Via alternative means                Oral Care Recommendations: Oral care QID Follow up Recommendations: 24 hour supervision/assistance;Skilled Nursing facility SLP Visit Diagnosis: Dysphagia, unspecified (R13.10) Plan: MBS                       Houston Siren 01/23/2019, 10:37 AM  Orbie Pyo Colvin Caroli.Ed Risk analyst [phone] Office [phone]

## 2019-01-23 NOTE — Progress Notes (Signed)
Occupational Therapy Treatment Patient Details Name: Frederick Moody MRN: [id number] DOB: January 23, 1956 Today's Date: 01/23/2019    History of present illness Pt is a 63 yo male with hx of uncontrolled DM and EtOH abuse who presents with weakness and falls. Pt admitted with DKA, sepsis with MSSA bacteremia and abscess on back (s/p I&D 7/21), AMS with EtOH withdrawal and possible benzo OD and aspiration requiring intubation 7/19-7/24. Re-intubated 7/27-7/29.    OT comments  Pt with improved ability to perform supine to sit and demonstrated ability to stand x 2 from EOB with +2 mod assist. Pt with significant LE weakness. Unable to weight shift or tolerate longer than a few a seconds. Pt with poor awareness of impact of his deficits on mobility and ADL.  Follow Up Recommendations  CIR;Supervision/Assistance - 24 hour    Equipment Recommendations  Other (comment)(defer to next venue)    Recommendations for Other Services      Precautions / Restrictions Precautions Precautions: Fall Restrictions Weight Bearing Restrictions: No       Mobility Bed Mobility Overal bed mobility: Needs Assistance Bed Mobility: Supine to Sit;Sit to Supine     Supine to sit: Min assist Sit to supine: +2 for physical assistance;Max assist   General bed mobility comments: cues and min physical assist to initiate supine to sit, HOB up, use of rail, assist for all aspects with return to supine.  Transfers Overall transfer level: Needs assistance Equipment used: Rolling walker (2 wheeled) Transfers: Sit to/from Stand Sit to Stand: +2 physical assistance;Mod assist        Lateral/Scoot Transfers: +2 physical assistance;Mod assist General transfer comment: allowed pt to place hands on walker during sit<>stand, assist to rise and steady    Balance Overall balance assessment: Needs assistance Sitting-balance support: Feet supported;Bilateral upper extremity supported Sitting balance-Leahy Scale:  Poor Sitting balance - Comments: LOB to L side, requiring min to mod assist   Standing balance support: Bilateral upper extremity supported Standing balance-Leahy Scale: Poor Standing balance comment: stood x 2 from EOB with use of RW, pt unable to weight shift in standing to take side steps or pivot to chair                           ADL either performed or assessed with clinical judgement   ADL Overall ADL's : Needs assistance/impaired     Grooming: Brushing hair;Bed level;Moderate assistance Grooming Details (indicate cue type and reason): decreased thoroughness             Lower Body Dressing: Total assistance;Bed level Lower Body Dressing Details (indicate cue type and reason): for socks                     Vision       Perception     Praxis      Cognition Arousal/Alertness: Awake/alert Behavior During Therapy: Flat affect Overall Cognitive Status: Impaired/Different from baseline Area of Impairment: Attention;Following commands;Memory;Orientation;Safety/judgement;Awareness;Problem solving                 Orientation Level: Disoriented to;Situation;Time Current Attention Level: Focused Memory: Decreased short-term memory Following Commands: Follows one step commands with increased time Safety/Judgement: Decreased awareness of safety;Decreased awareness of deficits Awareness: Intellectual Problem Solving: Slow processing;Decreased initiation;Requires verbal cues;Requires tactile cues General Comments: pt stating he could go home if his sister would just bring his wheelchair to the hospital        Exercises  Shoulder Instructions       General Comments      Pertinent Vitals/ Pain       Pain Assessment: No/denies pain Faces Pain Scale: Hurts a little bit Pain Intervention(s): Monitored during session  Home Living                                          Prior Functioning/Environment               Frequency  Min 2X/week        Progress Toward Goals  OT Goals(current goals can now be found in the care plan section)  Progress towards OT goals: Progressing toward goals  Acute Rehab OT Goals Patient Stated Goal: go home with w/c OT Goal Formulation: With patient Time For Goal Achievement: 02/04/19 Potential to Achieve Goals: Good  Plan Discharge plan remains appropriate    Co-evaluation    PT/OT/SLP Co-Evaluation/Treatment: Yes Reason for Co-Treatment: For patient/therapist safety;Complexity of the patient's impairments (multi-system involvement)   OT goals addressed during session: ADL's and self-care;Proper use of Adaptive equipment and DME      AM-PAC OT "6 Clicks" Daily Activity     Outcome Measure   Help from another person eating meals?: A Little Help from another person taking care of personal grooming?: A Lot Help from another person toileting, which includes using toliet, bedpan, or urinal?: Total Help from another person bathing (including washing, rinsing, drying)?: A Lot Help from another person to put on and taking off regular upper body clothing?: A Lot Help from another person to put on and taking off regular lower body clothing?: Total 6 Click Score: 11    End of Session Equipment Utilized During Treatment: Gait belt;Rolling walker;Oxygen(3L)  OT Visit Diagnosis: Unsteadiness on feet (R26.81);Muscle weakness (generalized) (M62.81)   Activity Tolerance Patient tolerated treatment well   Patient Left in bed;with call bell/phone within reach;with bed alarm set   Nurse Communication Mobility status        Time: [id number] OT Time Calculation (min): 20 min  Charges: OT General Charges $OT Visit: 1 Visit OT Treatments $Therapeutic Activity: 8-22 mins  Martie RoundJulie Mariajose Mow, OTR/L Acute Rehabilitation Services Pager: [phone] Office: [phone]   Evern BioMayberry, Davey Limas [place] 01/23/2019, 12:30 PM

## 2019-01-23 NOTE — Progress Notes (Signed)
Updated pt's sister, Neoma Laming, by phone on pt's wishes and [place]. She was able to speak with pt by phone regarding DNR/DNI. Pt told her he wants to come home. I advised that palliative care has been consulted. We will continue to treat pt for current diagnoses and transition focus to palliation/hospice/comfort if he declines. We also discussed the high likelihood of such.  She voiced understanding and appreciation and agreement with the patient's wishes.  She stated she was hoping that he would be able to make this decision himself.

## 2019-01-23 NOTE — Progress Notes (Signed)
Modified Barium Swallow Progress Note  Patient Details  Name: Frederick Moody MRN: [id number] Date of Birth: 1955-08-12  Today's Date: 01/23/2019  Modified Barium Swallow completed.  Full report located under Chart Review in the Imaging Section.  Brief recommendations include the following:  Clinical Impression  Pt demonstrated mild oropharyngeal dysphagia marked by mistiming of laryngeal protective sequence. Before epiglottis could achieve full deflection thin liquid was entered laryngeal vestibule close to vocal cords (trace remained). Possible incomplete pharyngeal ROM/contraction resulted in vallecular residue ranging from mild-moderate; mild pyriform sinuses residue. Oral boluses were transferred in a piecemeal fashion with 2-4 swallows to clear oral cavity. Head positions not attempted with pt's current cognitive status. Recommend pt initiate Dys 2, nectar thick liquids, crush pills and full assist/supervision for safety.                 Swallow Evaluation Recommendations       SLP Diet Recommendations: Dysphagia 2 (Fine chop) solids;Nectar thick liquid   Liquid Administration via: Straw;Cup   Medication Administration: Crushed with puree   Supervision: Staff to assist with self feeding;Full supervision/cueing for compensatory strategies   Compensations: Minimize environmental distractions;Lingual sweep for clearance of pocketing;Small sips/bites;Slow rate;Clear throat intermittently   Postural Changes: Seated upright at 90 degrees   Oral Care Recommendations: Oral care BID        Houston Siren 01/23/2019,5:28 PM   Orbie Pyo [place].Ed Risk analyst [phone] Office [phone]

## 2019-01-23 NOTE — Progress Notes (Addendum)
Received call from NP Francine Graven regarding the patient being stable for  transfer back to IMTS. We will resume care 01/24/19.   Lars Mage, MD Internal Medicine PGY3 [facility]PS[facility]:[phone] 01/23/2019, 10:49 AM

## 2019-01-23 NOTE — Progress Notes (Addendum)
    [facility] for Infectious Disease   Reason for visit: Follow up on MSSA bacteremia  Interval History: Patient extubated 7/29 @ noon. Doing well with normal saturation on 3 L [place]. Patient with Tmax of 100.6 (past 24 hours) and WBC stable at 6.9. Patient reports that he is doing well. Denies fever, chills, shortness of breath, nausea, vomiting, or any pain. Patient states that he wants to go home on Sunday and that this is his decision to make. Patient says he is willing to continue IV antibiotics when he goes home. He denies IV drug use or recreational drug use other than marijuana. He states that he lives at home with his brother.  Impression: Frederick Moody is a 63 year old male with MSSA bacteremia who is being treated with a prolonged course of IV ancef given location of abscess on I&D. There was also concern for pneumonia during ICU stay and tracheal aspirate showed stenotrophomonas.  TTE did not reveal vegetations, no indication for TEE given existing indication for prolonged IV abx course.  Plan:  1. Received 5 days of levofloxacin for stenotrophomonas pneumonia. Will discontinue today. 2. MSSA bacteremia - Received 10 days of ancef so far (since I&D). Will need total of 6 weeks of IV treatment. OPAT order has been placed. 3. ID to signoff  Physical Exam: Constitutional: in NAD. AOx3 Vitals:   01/23/19 1154 01/23/19 1200  BP:  (!) 141/73  Pulse:  (!) 103  Resp:  (!) 27  Temp: 100.2 F (37.9 C)   SpO2:  97%  Respiratory: Normal respiratory effort; CTA B Cardiovascular: RRR GI: soft, nt, nd  Review of Systems: Review of Systems  Constitutional: Negative for chills and fever.  Respiratory: Negative for shortness of breath.   Cardiovascular: Negative for chest pain.  Gastrointestinal: Negative for abdominal pain, nausea and vomiting.     Lab Results  Component Value Date   WBC 6.9 01/23/2019   HGB 7.2 (L) 01/23/2019   HCT 22.3 (L) 01/23/2019   MCV 98.7 01/23/2019   PLT 124 (L) 01/23/2019    Lab Results  Component Value Date   CREATININE 0.94 01/23/2019   BUN 28 (H) 01/23/2019   NA 139 01/23/2019   K 3.7 01/23/2019   CL 102 01/23/2019   CO2 29 01/23/2019    Lab Results  Component Value Date   ALT 32 01/18/2019   AST 57 (H) 01/18/2019   ALKPHOS 89 01/18/2019     Microbiology: No results found for this or any previous visit (from the past 240 hour(s)).   Jeanmarie Hubert, MD, PGY1 01/23/2019, 12:53 PM

## 2019-01-23 NOTE — Progress Notes (Signed)
Physical Therapy Treatment Patient Details Name: Frederick Moody MRN: [id number] DOB: 10/21/1955 Today's Date: 01/23/2019    History of Present Illness Pt is a 63 yo male with hx of uncontrolled DM and EtOH abuse who presents with weakness and falls. Pt admitted with DKA, sepsis with MSSA bacteremia and abscess on back (s/p I&D 7/21), AMS with EtOH withdrawal and possible benzo OD and aspiration requiring intubation 7/19-7/24. Re-intubated 7/27-7/29.     PT Comments    Pt making slow, steady progress. Continue to recommend CIR.    Follow Up Recommendations  CIR     Equipment Recommendations  Other (comment)(To be determined)    Recommendations for Other Services Rehab consult     Precautions / Restrictions Precautions Precautions: Fall Restrictions Weight Bearing Restrictions: No    Mobility  Bed Mobility Overal bed mobility: Needs Assistance Bed Mobility: Supine to Sit;Sit to Supine;Rolling Rolling: Mod assist   Supine to sit: Min assist Sit to supine: +2 for physical assistance;Max assist   General bed mobility comments: cues and min physical assist to initiate supine to sit, HOB up, use of rail, assist for all aspects with return to supine.  Transfers Overall transfer level: Needs assistance Equipment used: Rolling walker (2 wheeled) Transfers: Sit to/from Stand Sit to Stand: +2 physical assistance;Mod assist        Lateral/Scoot Transfers: +2 physical assistance;Mod assist General transfer comment: Assist to bring hips up and for balance. Pt with hands on walker to encourage anterior weight shift. Pt unable to weight shift to take any pivotal steps toward chair.  Ambulation/Gait             General Gait Details: Unable   Marine scientist [place] (Stroke Patients Only)       Balance Overall balance assessment: Needs assistance Sitting-balance support: Feet supported;Bilateral upper extremity  supported Sitting balance-Leahy Scale: Poor Sitting balance - Comments: LOB to rt side, requiring min to mod assist   Standing balance support: Bilateral upper extremity supported Standing balance-Leahy Scale: Poor Standing balance comment: stood x 2 from EOB with use of RW, pt unable to weight shift in standing to take side steps or pivot to chair. Stood for ~20 sec                            Cognition Arousal/Alertness: Awake/alert Behavior During Therapy: Flat affect Overall Cognitive Status: Impaired/Different from baseline Area of Impairment: Attention;Following commands;Memory;Orientation;Safety/judgement;Awareness;Problem solving                 Orientation Level: Disoriented to;Situation;Time Current Attention Level: Focused Memory: Decreased short-term memory Following Commands: Follows one step commands with increased time Safety/Judgement: Decreased awareness of safety;Decreased awareness of deficits Awareness: Intellectual Problem Solving: Slow processing;Decreased initiation;Requires verbal cues;Requires tactile cues General Comments: pt stating he could go home if his sister would just bring his wheelchair to the hospital. Pt later insisting he could get to the bathroom with w/c despite me pointing out he wasn't able to pivot from bed to chair.       Exercises      General Comments General comments (skin integrity, edema, etc.): VSS      Pertinent Vitals/Pain Pain Assessment: No/denies pain Faces Pain Scale: Hurts a little bit Pain Intervention(s): Monitored during session    Home Living  Prior Function            PT Goals (current goals can now be found in the care plan section) Acute Rehab PT Goals Patient Stated Goal: go home with w/c PT Goal Formulation: With patient Time For Goal Achievement: 01/31/19 Potential to Achieve Goals: Fair Progress towards PT goals: Progressing toward goals     Frequency    Min 3X/week      PT Plan Current plan remains appropriate    Co-evaluation PT/OT/SLP Co-Evaluation/Treatment: Yes Reason for Co-Treatment: Complexity of the patient's impairments (multi-system involvement);For patient/therapist safety PT goals addressed during session: Mobility/safety with mobility;Balance OT goals addressed during session: ADL's and self-care;Proper use of Adaptive equipment and DME      AM-PAC PT "6 Clicks" Mobility   Outcome Measure  Help needed turning from your back to your side while in a flat bed without using bedrails?: A Lot Help needed moving from lying on your back to sitting on the side of a flat bed without using bedrails?: A Little Help needed moving to and from a bed to a chair (including a wheelchair)?: Total Help needed standing up from a chair using your arms (e.g., wheelchair or bedside chair)?: A Lot Help needed to walk in hospital room?: Total Help needed climbing 3-5 steps with a railing? : Total 6 Click Score: 10    End of Session Equipment Utilized During Treatment: Gait belt;Oxygen Activity Tolerance: Patient tolerated treatment well;Patient limited by fatigue Patient left: in bed;with call bell/phone within reach;with bed alarm set Nurse Communication: Mobility status;Need for lift equipment(Sara Stedy (current unit doesn't have this equipment)) PT Visit Diagnosis: Other abnormalities of gait and mobility (R26.89);Muscle weakness (generalized) (M62.81);Difficulty in walking, not elsewhere classified (R26.2)     Time: [id number] PT Time Calculation (min) (ACUTE ONLY): 23 min  Charges:  $Therapeutic Activity: 8-22 mins                     [facility] Medical CenterCary Geanna Divirgilio PT Acute Rehabilitation Services Pager [phone] Office [phone]    Angelina OkCary W Memorial HospitalMaycok 01/23/2019, 2:18 PM

## 2019-01-23 NOTE — Progress Notes (Signed)
 Nutrition Follow-up   RD working remotely.   DOCUMENTATION CODES:   Non-severe (moderate) malnutrition in context of chronic illness  INTERVENTION:   Magic cup TID with meals, each supplement provides 290 kcal and 9 grams of protein  Ensure Enlive po BID, each supplement provides 350 kcal and 20 grams of protein   NUTRITION DIAGNOSIS:   Moderate Malnutrition related to chronic illness(poorly controlled DM, EtOH abuse, abscess) as evidenced by percent weight loss, mild muscle depletion.  Being addressed as diet advanced, supplements  GOAL:   Patient will meet greater than or equal to 90% of their needs  Progressing  MONITOR:   Diet advancement, PO intake, Labs, Weight trends, Skin  REASON FOR ASSESSMENT:   Ventilator    ASSESSMENT:   63 yo male with hx of uncontrolled DM and EtOH abuse presents with weakness and falls. Pt admitted with DKA, sepsis with MSSA bacteremia and abscess on back, AMS with EtOH withdrawal and possible benzo OD and aspiration requiring intubation.  7/15 Admit 7/19 Intubated 7/21 I&D posterior neck/upper back abscess; 15 cm x 10 cm x 5 cm 7/24 Extubated 7/27 Re-Intubated 7/29 Extubated, TF continued via NG tube 7/31 NG tube removed, MBS  NG tube removed this AM, TF stopped MBS today, per SLP, diet to be advanced to Dysphagia II, Nectar thick  Labs: reviewed Meds: lasix, ss novolog, levemir, MVI with Minerals, thiamine   Diet Order:   Diet Order            Diet NPO time specified  Diet effective now              EDUCATION NEEDS:   Not appropriate for education at this time  Skin:  Skin Assessment: Skin Integrity Issues: Skin Integrity Issues:: Stage II Stage II: sacrm Other: neck/back abcess measuring 15 x 10 x 5 cm post debridment  Last BM:  7/30  Height:   Ht Readings from Last 1 Encounters:  01/12/19 5\' 9"  (1.753 m)    Weight:   Wt Readings from Last 1 Encounters:  01/23/19 91.3 kg    Ideal Body Weight:      BMI:  Body mass index is 29.72 kg/m.  Estimated Nutritional Needs:   Kcal:  2200-2400 kcals  Protein:  125-155 g  Fluid:  >/= 2 L    Quamir Willemsen MS, RDN, LDN, CNSC [phone] Pager  [phone] Weekend/On-Call Pager

## 2019-01-23 NOTE — Progress Notes (Signed)
Bilateral upper extremity venous duplex and bilateral lower extremity venous duplex completed. Refer to "CV Proc" under chart review to view preliminary results.  01/23/2019 2:29 PM Maudry Mayhew, MHA, RVT, RDCS, RDMS

## 2019-01-23 NOTE — Progress Notes (Addendum)
NAME:  Frederick SizerRobert Moody, MRN:  [id number], DOB:  1956-01-19, LOS: 16 ADMISSION DATE:  01/07/2019, CONSULTATION DATE:  01/11/19 CHIEF COMPLAINT:  Encephaolpathy   Brief History    63 yo with history of uncontrolled DM and EtOH abuse who presented for weakness and falls. Found to have etoh withdrawal and DKA (resolved) and MSSA bacteremia likely source is abscess on upper back. Patient was admitted to CCM after being unable to protect his airway, thought to be due to excess benzodiazepenes due to his withdrawal.   Past Medical History  Poorly controlled DM, ETOH abuse  Principal Problem:   MSSA bacteremia Active Problems:   DKA (diabetic ketoacidoses) (HCC)   Alcohol withdrawal (HCC)   Aspiration pneumonia (HCC)   Sepsis (HCC)   Acute encephalopathy   Acute respiratory failure (HCC)   Malnutrition of moderate degree   Hypokalemia   Pressure injury of skin   Significant Hospital Events   01/07/19 - admitted for MSSA positive  01/11/19 - intubated and transferred to MICU 01/12/19 - OR for I & D of upper back   Consults:  ID  Surgery  Nutrition  Cardiology   Procedures:  01/11/2019-intubation 01/11/19 - Central Line>>7/30 7/20 - OR for abscess drain  7/20 - I/O, foley placement 7/27- re-intubated>>7/29 7/30 R basilic PICC>>   Significant Diagnostic Tests:  7/20 MRI Elbow >> severe myositis, small fluid collection, subcutaneous swelling  7/20 Ab XR Gastric tube courses into the stomach with its tip in the antropyloric region, appropriately positioned.  Ct Head Wo Contrast 7/20 CT Head w/o - no acute findings, remote infarcts, chronic axillary sinusitis 7/20 CT chest W/O-moderate layering pleural effusions with multisegmental atelectasis.  Dorsal cervical thoracic soft tissue inflammation 7/20/CT spine 11 x 5 x 6 cm abscess in the dorsal cervicothoracic soft tissues extending from the skin surface into the superficial musculature. There is extension to spinous processes  without bony erosion.  7/17 TTE -normal systolic function with EF 55 to 60%.  Left atrium mildly dilated.  Mild mitral annular calcification present.  No stenosis of aortic valve but with mild thickening and calcification.  7/17 UE U/S Focal area of soft tissue edema with surrounding Doppler flow in the medial aspect of left elbow which appears to be centered in the flexor musculature concerning for myositis. If there is further clinical concern regarding septic arthritis, recommend an MRI of the left elbow. .   7/22 left upper extremity MRI -forearm: Extensive muscle and subcutaneous edema visualized throughout the left arm consistent with cellulitis and myositis.  Humerus: Inflammation spreads to shoulder girdle dural and left upper arm and left lateral chest consistent with severe myositis.   7/31 CXR personally reviewed IMPRESSION: 1. Interval extubation. NG tube and right PICC line stable position.  2. Cardiomegaly with bilateral pulmonary infiltrates/edema and small right pleural effusions suggesting CHF. Bilateral pneumonia cannot be excluded.  3. Persistent air collection over the lower neck/upper chest, most likely related to previously identified abscess over the posterior neck.   Micro Data:  C. difficile PCR negative MRSA negative 01/07/19 - blood culture >> + MSSA (Resistant to cipro, clinda, erythro) 01/07/19 - Urine cx >> + staph aureus pan senstive (Resistant to cipro)  01/09/19 - Blood culture >> Neg 01/10/19 - Urine Culture >> Neg 01/12/19 - Blood culture >> Neg  01/12/19 - Abscess culture >> staph aureus resistant to Cipro 01/12/19 -trach aspirate: Stenotrophomonas final  Antimicrobials:  7/20 Unasyn >> 7/26 7/16-17, 7/20 Vanc 7/16> Ceftriaxone 7/16 > Ancef  7/15-7/16> Cefepime 7/27 Levofloxacin>> 7/26 Cefazolin>>  Interim history/subjective:  Low grade temp. Increased WOB and FiO2 overnight. Responded well to diuresis. A&Ox4 this am. Adamant that he does not  want to be reintubated, placed on ventilator or tracheostomy. I asked him if he understands he could die without these interventions he states "yes, I do not want to be on a breathing machine."   Objective   Temp:  [97.8 F (36.6 C)-100.6 F (38.1 C)] 100.6 F (38.1 C) (07/31 0721) Pulse Rate:  [84-115] 114 (07/31 0700) Resp:  [23-36] 33 (07/31 0700) BP: (117-186)/(45-81) 160/45 (07/31 0600) SpO2:  [90 %-100 %] 95 % (07/31 0700) Weight:  [91.3 kg] 91.3 kg (07/31 0323)        Intake/Output      07/30 0701 - 07/31 0700 07/31 0701 - 08/01 0700   I.V. (mL/kg) 0 (0)    NG/GT 1930    IV Piggyback 921.7    Total Intake(mL/kg) 2851.7 (31.2)    Urine (mL/kg/hr) 1460 (0.7)    Total Output 1460    Net +1391.7         Urine Occurrence 1 x       Filed Weights   01/21/19 0500 01/22/19 0446 01/23/19 0323  Weight: 86 kg 91 kg 91.3 kg   Examination: General: Well developed, chronically ill appearing male, sitting up in bed, appears uncomfortable. Weak voice HEENT: Normocephalic, PERRL.  Mucous membranes moist  Lungs: BBS with rhonchitic cough, diminished bases full, nonlabored. SPO2 95% on 5lpm  Cardiovascular: RRR,S1S2. No MRG Abdomen: Bowel sounds x4, soft, NT, ND Extremities: 1+ edema to upper extremities, 2+ edema to shins to lower extremities Neuro: A&Ox4. No focal deficits. MAE symmetrically   Resolved hospital problems DKA Hyponatremia  Assessment & Plan:  MSSA Bacteremia likely due to soft tissue abscess on upper back, s/p drainage in the OR. No TEE per cards. Per ID, patient will need long course of ancef as patient is not getting TEE. WBC normal.     PLAN -  -Cefazolin per ID -Would likely place wound VAC to neck prior to discharge -Surgery has signed off.  Plan for follow-up outpatient  Fever.  No leukocytosis.  Remains on antibiotics. Plan -Upper/Lower EXT duplex to assess for DVT/SVT  Pneumonia PLAN -Levofloxacin per ID for steno pneumonia  Acute hypoxic  respiratory failure - reintubated 7/27 r/t AMS and inability to protect airway. C/b pulm edema +/- aspiration PNA. Extubated again 7/29.  Increased work of breathing and pulmonary edema noted overnight.  Was diuresed with good response.  As noted after discussion patient is adamant about DNR/DNI.  See discussion below PLAN -  -Continue aggressive pulm hygiene -Continue mucolytic -OOB  -Continue diuresis -Supplemental O2 to maintain SPO2 greater than or equal to 92% -Echo dilators.   AMS - Possible ETOH withdrawal. Now out side of window for acute withdrawal. PLAN -  -Supportive care -wean clonidine -PRN ativan -Continue thiamine, multivitamin, folic acid -Continue Zyprexa   Severe Myositis & cellulitis of LUE  Orthopedic surgery consulted and recommend supportive measures with no drainable fluid at this time.  They have signed off.  CK wnl PLAN  - Monitor    Diabetes - A1c 11.7.   PLAN -  Continue SSI  Continue Levemir   Chronic anemia  PLAN -  Monitor on lovenox  F/u CBC   Nutrition  PLAN -  SLP following TF per recs   CODE STATUS: Given patient's increased work of breathing and tenuous respiratory status  I reviewed this with him. As noted discussion with patient that he is adamant that he does not want to be reintubated, does not want tracheostomy and long-term mechanical ventilation or to be placed in a facility for such.  Asked the patient he understands if this occurs that he could die.  He states yes and he is aware of that.  He agrees to palliative care consult.  We discussed if he has relayed this information to his family in the past, he is unclear if they are aware.  Will facilitate discussion with same today along with palliative care.    Best practice:  Diet: NPO.  Tube feeds per commendations Pain/Anxiety/Delirium protocol (if indicated): prn VAP protocol (if indicated): No DVT prophylaxis: LOVENOX GI prophylaxis: Famotidine Glucose control: levemir,  SSI  Mobility: PT/OT OOB as much as possible  Code Status: DNR/DNI Family Communication: Will update Sister  Disposition:  Can transfer out of ICU.   I spent 45 minutes in direct patient care including reviewing data,  discussing with other providers, assessment, planning and stabilization and documentation. Time is exclusive to this patient and does not include procedures.   Karin Lieuhonda Joley Utecht, MSN, AGACNP  Pager [phone] or if no answer [phone] [facility]

## 2019-01-23 NOTE — Progress Notes (Signed)
BP, HR and RR increasing, O2 demands increasing, on 5L [place] at this time, pt stated "I couldn't catch my breath." Pt repositioned, pulmonary toilet performed, unable to perform IS as well as yesterday. Pt improved with interventions. Pt is adamant that he does not want to be reintubated or be trached, however, pt remains full code. MD notified of patient status and wishes.

## 2019-01-23 NOTE — Progress Notes (Signed)
eLink Physician-Brief Progress Note Patient Name: Frederick Moody DOB: 08/12/1955 MRN: [id number]   Date of Service  01/23/2019  HPI/Events of Note  Increase WOB,RR, BP and HR. CXR reveals cardiomegaly with bilateral pulmonary infiltrates/edema and small right pleural effusions suggesting CHF.  eICU Interventions  Will order: 1. Lasix 40 mg IV now.     Intervention Category Major Interventions: Hypoxemia - evaluation and management  Sommer,Steven Cornelia Copa 01/23/2019, 6:38 AM

## 2019-01-24 DIAGNOSIS — F10230 Alcohol dependence with withdrawal, uncomplicated: Secondary | ICD-10-CM

## 2019-01-24 DIAGNOSIS — R531 Weakness: Secondary | ICD-10-CM

## 2019-01-24 DIAGNOSIS — R1310 Dysphagia, unspecified: Secondary | ICD-10-CM

## 2019-01-24 DIAGNOSIS — E0810 Diabetes mellitus due to underlying condition with ketoacidosis without coma: Secondary | ICD-10-CM

## 2019-01-24 DIAGNOSIS — Z515 Encounter for palliative care: Secondary | ICD-10-CM

## 2019-01-24 LAB — CBC
HCT: 21.7 % — ABNORMAL LOW (ref 39.0–52.0)
HCT: 25.1 % — ABNORMAL LOW (ref 39.0–52.0)
Hemoglobin: 6.9 g/dL — CL (ref 13.0–17.0)
Hemoglobin: 8.2 g/dL — ABNORMAL LOW (ref 13.0–17.0)
MCH: 31.2 pg (ref 26.0–34.0)
MCH: 31.4 pg (ref 26.0–34.0)
MCHC: 31.8 g/dL (ref 30.0–36.0)
MCHC: 32.7 g/dL (ref 30.0–36.0)
MCV: 98.2 fL (ref 80.0–100.0)
MCV: 96.2 fL (ref 80.0–100.0)
Platelets: 167 10*3/uL (ref 150–400)
Platelets: 191 10*3/uL (ref 150–400)
RBC: 2.21 MIL/uL — ABNORMAL LOW (ref 4.22–5.81)
RBC: 2.61 MIL/uL — ABNORMAL LOW (ref 4.22–5.81)
RDW: 14 % (ref 11.5–15.5)
RDW: 15.4 % (ref 11.5–15.5)
WBC: 6.5 10*3/uL (ref 4.0–10.5)
WBC: 6.8 10*3/uL (ref 4.0–10.5)
nRBC: 0 % (ref 0.0–0.2)
nRBC: 0 % (ref 0.0–0.2)

## 2019-01-24 LAB — RENAL FUNCTION PANEL
Albumin: 2 g/dL — ABNORMAL LOW (ref 3.5–5.0)
Anion gap: 9 (ref 5–15)
BUN: 25 mg/dL — ABNORMAL HIGH (ref 8–23)
CO2: 30 mmol/L (ref 22–32)
Calcium: 8.8 mg/dL — ABNORMAL LOW (ref 8.9–10.3)
Chloride: 102 mmol/L (ref 98–111)
Creatinine, Ser: 0.85 mg/dL (ref 0.61–1.24)
GFR calc Af Amer: >60 mL/min (ref >60)
GFR calc non Af Amer: >60 mL/min (ref >60)
Glucose, Bld: 115 mg/dL — ABNORMAL HIGH (ref 70–99)
Phosphorus: 4.2 mg/dL (ref 2.5–4.6)
Potassium: 3.6 mmol/L (ref 3.5–5.1)
Sodium: 141 mmol/L (ref 135–145)

## 2019-01-24 LAB — GLUCOSE, CAPILLARY
Glucose-Capillary: 103 mg/dL — ABNORMAL HIGH (ref 70–99)
Glucose-Capillary: 114 mg/dL — ABNORMAL HIGH (ref 70–99)
Glucose-Capillary: 129 mg/dL — ABNORMAL HIGH (ref 70–99)
Glucose-Capillary: 56 mg/dL — ABNORMAL LOW (ref 70–99)
Glucose-Capillary: 72 mg/dL (ref 70–99)
Glucose-Capillary: 88 mg/dL (ref 70–99)
Glucose-Capillary: 97 mg/dL (ref 70–99)

## 2019-01-24 LAB — MAGNESIUM: Magnesium: 1.5 mg/dL — ABNORMAL LOW (ref 1.7–2.4)

## 2019-01-24 LAB — ABO/RH: ABO/RH(D): O POS

## 2019-01-24 LAB — PREPARE RBC (CROSSMATCH)

## 2019-01-24 MED ORDER — SODIUM CHLORIDE 0.9% IV SOLUTION
Freq: Once | INTRAVENOUS | Status: AC
  Administered 2019-01-24 (×2): via INTRAVENOUS

## 2019-01-24 MED ORDER — AMLODIPINE BESYLATE 10 MG PO TABS
10.0000 mg | ORAL_TABLET | Freq: Once | ORAL | Status: AC
  Administered 2019-01-24: 10 mg via ORAL
  Filled 2019-01-24: qty 1

## 2019-01-24 MED ORDER — FAMOTIDINE 20 MG PO TABS
20.0000 mg | ORAL_TABLET | Freq: Every day | ORAL | Status: DC
  Administered 2019-01-24 – 2019-02-09 (×17): 20 mg via ORAL
  Filled 2019-01-24 (×16): qty 1

## 2019-01-24 MED ORDER — MAGNESIUM SULFATE 4 GM/100ML IV SOLN
4.0000 g | Freq: Once | INTRAVENOUS | Status: AC
  Administered 2019-01-24: 4 g via INTRAVENOUS
  Filled 2019-01-24: qty 100

## 2019-01-24 MED ORDER — DEXTROSE 50 % IV SOLN
INTRAVENOUS | Status: AC
  Filled 2019-01-24: qty 50

## 2019-01-24 NOTE — Progress Notes (Signed)
   Subjective: patient doing well this morning. He states that he is very tired because he did not get much sleep last night. He has no complaints today. We discussed the results of his upper extremity Doppler studies. Discussed the plan to continue to work with speech, physical therapy, and progressed towards admission to rehab. All questions and concerns addressed.  Objective: Vital signs in last 24 hours: Vitals:   01/24/19 1000 01/24/19 1005 01/24/19 1054 01/24/19 1100  BP:  (!) 140/56 (!) 153/79 (!) 163/79  Pulse: 97 96 94 94  Resp: (!) 27 (!) 24 (!) 21 18  Temp:   98.7 F (37.1 C)   TempSrc:   Axillary   SpO2: 98% 98% 100% 99%  Weight:      Height:       General: Ill appearing male in no acute distress Pulm: Good air movement with no wheezing or crackles  CV: RRR, no murmurs, no rubs  Extremities: Severe LE edema  Neuro: Alert and oriented x 3  Assessment/Plan:  Frederick Moody is a 63 y.o male with uncontrolled DM and alcohol use disorder who presented to the ED on 01/07/19 with complaints of generalized weakness and a fall. Initial labs were consistent with DKA and blood cultures subsequently grew MSSA. He was initially managed on the floors; however, required escalating doses of ativan for alcohol withdrawal and was unable to protect his airway. He was intubated and transferred to the ICU on 01/11/19. He was stabilized, extubated, and subsequently transferred back to IMTS on 8/1 for continued management.   MSSA Bacteremia 2/2 soft tissue abscess (upper back) Myositis and LUE cellulitis  - S/p I&D on 7/20  - Negative blood cultures 7/20  - Continue Ancef through 02/24/2019 - ID has signed off.  - Will need wound vac for neck prior to discharge to rehab   Acute Hypoxic Respiratory Failure  Steno PNA  - Extubated on 7/29, maintaining SaO2 on 2L/min [place] - Completed 5 day course of Levaquin  Encephalopathy  EtOH withdrawal  - Continue Zyprexa  - Wean clonidine    Uncontrolled DM  - CBG at goal of <180  - Continue Levemir 14 units BID - Continue SSI, sensitive  Superficial Venous Thrombosis  - No LE VTE on duplex 7/31 - Bilateral superficial cephalic vein thrombosis 6/19 - No need to anticoagulate - Monitor   Anemia of critical illness  - Hgb 6.9 this AM  - 1 units pRBCs ordered by PCCM  - Follow-up post transfusion H&H  Deconditioning 2/2 Critical Illness  - PT/OT recommending CIR  Dysphagia  - Continue to work with speech  - Currently dysphagia 2 diet  Dispo: Anticipated discharge in approximately 2-3 day(s).   Ina Homes, MD 01/24/2019, 12:01 PM

## 2019-01-24 NOTE — Consult Note (Signed)
Consultation Note Date: 01/24/2019   Patient Name: Frederick Moody  DOB: 1956/06/23  MRN: [id number]  Age / Sex: 63 y.o., male  PCP: Patient, No Pcp Per Referring Physician: Bartholomew Crews, MD  Reason for Consultation: Establishing goals of care  HPI/Patient Profile: 63 y.o. male, Frederick Moody,  with past medical history of DM, alcoholism and benzodiazepine dependence who was admitted on 01/07/2019 with DKA and bacteremia from an abscess on his back.  His hospitalization has been complicated by alcohol and benzodiazepine withdraw.  He has required intubation 2x.  He is now extubated.  He requires IV antibiotics thru 9/1.  He is a DNR.     Clinical Assessment and Goals of Care:  I have reviewed medical records including EPIC notes, labs and imaging, received report from the care team, assessed the patient and then spoke with him about diagnosis prognosis, GOC, EOL wishes, disposition and options.  I introduced Palliative Medicine as specialized medical care for people living with serious illness. It focuses on providing relief from the symptoms and stress of a serious illness. The goal is to improve quality of life for both the patient and the family.  We discussed a brief life review of the patient. He lives in [place] at home with his two brothers, sister, and his daughter.  His daughter is an Therapist, sports.  Frederick Moody tells me that his sister Frederick Moody is his Durable POA and his daughter [facility] in combination with Frederick Moody are his HCPOAs.  He feels his daughter [facility] can help with his wound care and antibiotics in the home.  We talked about his goals for quality of life.  Per Frederick Moody if he has no hope of being up, walking around and speaking with his family he would rather not be here "I would welcome death".  He does not ever want a tracheostomy or a feeding tube.    We talked about aspiration - he does not like thickened liquids  and does not feel he has a problem with aspiration.  I spoke with his daughter [facility].  She does live at home with the family and is a disabled Therapist, sports.  Hollie confirmed that she understood and supported her fathers decisions regarding DNR, No feeding tube, and the over all idea that if he can not walk and have some independence he would rather not be here.  Family is in the process of obtaining medicaid and was hopeful that a hospital social worker can talk to them about the process.  Questions and concerns were addressed.   The family was encouraged to call with questions or concerns.    Primary Decision Maker:  PATIENT      SUMMARY OF RECOMMENDATIONS     HCPOA is a combination of dtr Frederick Moody and sister Frederick Moody -  Phone number [phone].  Patient is DNR.  Would never want a tracheostomy or PEG tube.  Will ask SLP to reassess for possible progression to thin liquids.   Code Status/Advance Care Planning:  DNR   Symptom Management:  Per primary team.  Additional Recommendations (Limitations, Scope, Preferences):  Full Scope Treatment  Palliative Prophylaxis:   Frequent Pain Assessment  Psycho-social/Spiritual:   Desire for further Chaplaincy support:  Not at this time.  Prognosis:  Unable to determine    Discharge Planning: To Be Determined.  At this time family [facility](Hollie) would prefer CIR prior to discharge.        Primary Diagnoses: Present on Admission: . DKA (diabetic ketoacidoses) (HCC)   I have reviewed the medical record, interviewed the patient and family, and examined the patient. The following aspects are pertinent.  Past Medical History:  Diagnosis Date  . Diabetes mellitus without complication (HCC)   . ETOH abuse    Social History   Socioeconomic History  . Marital status: Single    Spouse name: Not on file  . Number of children: Not on file  . Years of education: Not on file  . Highest education level: Not on file  Occupational  History  . Not on file  Social Needs  . Financial resource strain: Not on file  . Food insecurity    Worry: Not on file    Inability: Not on file  . Transportation needs    Medical: Not on file    Non-medical: Not on file  Tobacco Use  . Smoking status: Never Smoker  . Smokeless tobacco: Never Used  Substance and Sexual Activity  . Alcohol use: Not Currently  . Drug use: Never  . Sexual activity: Not on file  Lifestyle  . Physical activity    Days per week: Not on file    Minutes per session: Not on file  . Stress: Not on file  Relationships  . Social Musicianconnections    Talks on phone: Not on file    Gets together: Not on file    Attends religious service: Not on file    Active member of club or organization: Not on file    Attends meetings of clubs or organizations: Not on file    Relationship status: Not on file  Other Topics Concern  . Not on file  Social History Narrative  . Not on file   History reviewed. No pertinent family history. Scheduled Meds: . chlorhexidine  15 mL Mouth Rinse BID  . Chlorhexidine Gluconate Cloth  6 each Topical Q0600  . [START ON 01/25/2019] cloNIDine  0.1 mg Oral Daily  . enoxaparin (LOVENOX) injection  40 mg Subcutaneous Q24H  . famotidine  20 mg Oral Daily  . feeding supplement (ENSURE ENLIVE)  237 mL Oral BID BM  . folic acid  1 mg Per Tube Daily  . furosemide  40 mg Intravenous Q8H  . insulin aspart  0-9 Units Subcutaneous Q4H  . insulin aspart  2 Units Subcutaneous Q4H  . insulin detemir  14 Units Subcutaneous BID  . mouth rinse  15 mL Mouth Rinse q12n4p  . multivitamin with minerals  1 tablet Per Tube Daily  . OLANZapine  5 mg Per NG tube QHS  . thiamine  100 mg Per Tube Daily   Continuous Infusions: .  ceFAZolin (ANCEF) IV 2 g (01/24/19 1319)  . feeding supplement (VITAL AF 1.2 CAL) Stopped (01/23/19 0931)   PRN Meds:.ibuprofen, ipratropium-albuterol, lip balm, LORazepam, Melatonin, polyvinyl alcohol, Resource ThickenUp Clear  No Known Allergies Review of Systems does not complain of SOB, pain, constipation.  Physical Exam  Well developed male, awake, alert, coherent, cooperative. Resp NAD   Vital Signs: BP (!) 163/79   Pulse  94   Temp 97.8 F (36.6 C) (Oral)   Resp 18   Ht 5\' 9"  (1.753 m)   Wt 90.4 kg   SpO2 99%   BMI 29.43 kg/m  Pain Scale: 0-10 POSS *See Group Information*: 1-Acceptable,Awake and alert Pain Score: 0-No pain   SpO2: SpO2: 99 % O2 Device:SpO2: 99 % O2 Flow Rate: .O2 Flow Rate (L/min): 2 L/min  IO: Intake/output summary:   Intake/Output Summary (Last 24 hours) at 01/24/2019 1341 Last data filed at 01/24/2019 1054 Gross per 24 hour  Intake 929.81 ml  Output 1510 ml  Net -580.19 ml    LBM: Last BM Date: 01/22/19 Baseline Weight: Weight: 79.4 kg Most recent weight: Weight: 90.4 kg     Palliative Assessment/Data:   40%     Time In: 1:00 Time Out: 2:12 Time Total: 72 min Visit consisted of counseling and education dealing with the complex and emotionally intense issues surrounding the need for palliative care and symptom management in the setting of serious and potentially life-threatening illness. Greater than 50%  of this time was spent counseling and coordinating care related to the above assessment and plan.  Signed by: Norvel RichardsMarianne Aaliayah Miao, PA-C Palliative Medicine Pager: [phone]  Please contact Palliative Medicine Team phone at [phone] for questions and concerns.  For individual provider: See Loretha StaplerAmion

## 2019-01-24 NOTE — Social Work (Signed)
CSW acknowledging consult for SNF placement. Currently pt recommended for CIR; they are following. Will follow for therapy recommendations as pt continues to stabilize.   Westley Hummer, MSW, [place] Work [phone]

## 2019-01-24 NOTE — Progress Notes (Addendum)
Hypoglycemic Event  CBG: 56  Treatment: 4 oz juice/soda  Symptoms: None  Follow-up CBG: Time: 2025 CBG Result: 88  Possible Reasons for Event: Inadequate meal intake  Comments/MD notified: Benjamine Mola, MD ([facility]) paged to notify. Return call from Benjamine Mola, MD ordered to hold levemir for this evening. Will hold levemir and continue to monitor.    Palin Tristan Ladoris Gene

## 2019-01-24 NOTE — Progress Notes (Signed)
CRITICAL VALUE ALERT  Critical Value:  Hemoglobin 6.9  Date & Time Notied:  01/24/19 4:16 AM   Provider Notified: Oletta Darter, MD  Orders Received/Actions taken: awaiting orders

## 2019-01-24 NOTE — Progress Notes (Signed)
eLink Physician-Brief Progress Note Patient Name: Clester Chlebowski DOB: 12/05/1955 MRN: [id number]   Date of Service  01/24/2019  HPI/Events of Note  Anemia - Hgb = 6.9.   eICU Interventions  Will transfuse 1 unit PRBC.     Intervention Category Major Interventions: Other:  Dorlisa Savino Cornelia Copa 01/24/2019, 4:27 AM

## 2019-01-25 DIAGNOSIS — G7281 Critical illness myopathy: Secondary | ICD-10-CM

## 2019-01-25 LAB — RENAL FUNCTION PANEL
Albumin: 2.1 g/dL — ABNORMAL LOW (ref 3.5–5.0)
Anion gap: 12 (ref 5–15)
BUN: 21 mg/dL (ref 8–23)
CO2: 30 mmol/L (ref 22–32)
Calcium: 8.8 mg/dL — ABNORMAL LOW (ref 8.9–10.3)
Chloride: 96 mmol/L — ABNORMAL LOW (ref 98–111)
Creatinine, Ser: 0.87 mg/dL (ref 0.61–1.24)
GFR calc Af Amer: >60 mL/min (ref >60)
GFR calc non Af Amer: >60 mL/min (ref >60)
Glucose, Bld: 140 mg/dL — ABNORMAL HIGH (ref 70–99)
Phosphorus: 4.3 mg/dL (ref 2.5–4.6)
Potassium: 3.4 mmol/L — ABNORMAL LOW (ref 3.5–5.1)
Sodium: 138 mmol/L (ref 135–145)

## 2019-01-25 LAB — CBC
HCT: 26.8 % — ABNORMAL LOW (ref 39.0–52.0)
Hemoglobin: 8.7 g/dL — ABNORMAL LOW (ref 13.0–17.0)
MCH: 31.4 pg (ref 26.0–34.0)
MCHC: 32.5 g/dL (ref 30.0–36.0)
MCV: 96.8 fL (ref 80.0–100.0)
Platelets: 230 10*3/uL (ref 150–400)
RBC: 2.77 MIL/uL — ABNORMAL LOW (ref 4.22–5.81)
RDW: 14.7 % (ref 11.5–15.5)
WBC: 6.9 10*3/uL (ref 4.0–10.5)
nRBC: 0 % (ref 0.0–0.2)

## 2019-01-25 LAB — BPAM RBC
Blood Product Expiration Date: 202008202359
ISSUE DATE / TIME: 202008010651
Unit Type and Rh: 5100

## 2019-01-25 LAB — GLUCOSE, CAPILLARY
Glucose-Capillary: 105 mg/dL — ABNORMAL HIGH (ref 70–99)
Glucose-Capillary: 125 mg/dL — ABNORMAL HIGH (ref 70–99)
Glucose-Capillary: 139 mg/dL — ABNORMAL HIGH (ref 70–99)
Glucose-Capillary: 157 mg/dL — ABNORMAL HIGH (ref 70–99)
Glucose-Capillary: 162 mg/dL — ABNORMAL HIGH (ref 70–99)
Glucose-Capillary: 79 mg/dL (ref 70–99)

## 2019-01-25 LAB — TYPE AND SCREEN
ABO/RH(D): O POS
Antibody Screen: NEGATIVE
Unit division: 0

## 2019-01-25 LAB — MAGNESIUM: Magnesium: 1.6 mg/dL — ABNORMAL LOW (ref 1.7–2.4)

## 2019-01-25 MED ORDER — INSULIN ASPART 100 UNIT/ML ~~LOC~~ SOLN
0.0000 [IU] | Freq: Every day | SUBCUTANEOUS | Status: DC
  Administered 2019-01-28 – 2019-02-05 (×2): 2 [IU] via SUBCUTANEOUS
  Administered 2019-02-06: 3 [IU] via SUBCUTANEOUS

## 2019-01-25 MED ORDER — MAGNESIUM SULFATE 4 GM/100ML IV SOLN
4.0000 g | Freq: Once | INTRAVENOUS | Status: AC
  Administered 2019-01-25: 4 g via INTRAVENOUS
  Filled 2019-01-25: qty 100

## 2019-01-25 MED ORDER — INSULIN DETEMIR 100 UNIT/ML ~~LOC~~ SOLN
14.0000 [IU] | Freq: Every day | SUBCUTANEOUS | Status: DC
  Administered 2019-01-25 – 2019-01-28 (×4): 14 [IU] via SUBCUTANEOUS
  Filled 2019-01-25 (×5): qty 0.14

## 2019-01-25 MED ORDER — INSULIN ASPART 100 UNIT/ML ~~LOC~~ SOLN
0.0000 [IU] | Freq: Three times a day (TID) | SUBCUTANEOUS | Status: DC
  Administered 2019-01-25: 2 [IU] via SUBCUTANEOUS
  Administered 2019-01-25 – 2019-01-26 (×4): 3 [IU] via SUBCUTANEOUS
  Administered 2019-01-26: 2 [IU] via SUBCUTANEOUS
  Administered 2019-01-27 (×3): 3 [IU] via SUBCUTANEOUS
  Administered 2019-01-28: 1 [IU] via SUBCUTANEOUS
  Administered 2019-01-28: 3 [IU] via SUBCUTANEOUS
  Administered 2019-01-28: 13:00:00 2 [IU] via SUBCUTANEOUS
  Administered 2019-01-29 (×2): 3 [IU] via SUBCUTANEOUS
  Administered 2019-01-29: 8 [IU] via SUBCUTANEOUS
  Administered 2019-01-30: 5 [IU] via SUBCUTANEOUS
  Administered 2019-01-30 – 2019-01-31 (×2): 3 [IU] via SUBCUTANEOUS
  Administered 2019-01-31: 8 [IU] via SUBCUTANEOUS
  Administered 2019-01-31 – 2019-02-01 (×3): 3 [IU] via SUBCUTANEOUS
  Administered 2019-02-01: 2 [IU] via SUBCUTANEOUS
  Administered 2019-02-02: 3 [IU] via SUBCUTANEOUS
  Administered 2019-02-02: 2 [IU] via SUBCUTANEOUS
  Administered 2019-02-02 – 2019-02-03 (×3): 3 [IU] via SUBCUTANEOUS
  Administered 2019-02-03: 5 [IU] via SUBCUTANEOUS
  Administered 2019-02-04 – 2019-02-05 (×4): 3 [IU] via SUBCUTANEOUS
  Administered 2019-02-05 – 2019-02-06 (×2): 5 [IU] via SUBCUTANEOUS
  Administered 2019-02-06: 3 [IU] via SUBCUTANEOUS
  Administered 2019-02-06: 5 [IU] via SUBCUTANEOUS
  Administered 2019-02-07: 3 [IU] via SUBCUTANEOUS
  Administered 2019-02-07: 5 [IU] via SUBCUTANEOUS
  Administered 2019-02-08 (×2): 3 [IU] via SUBCUTANEOUS
  Administered 2019-02-08: 5 [IU] via SUBCUTANEOUS
  Administered 2019-02-09: 3 [IU] via SUBCUTANEOUS
  Administered 2019-02-09: 8 [IU] via SUBCUTANEOUS

## 2019-01-25 NOTE — Progress Notes (Signed)
   Subjective: Upset this AM. Feels very weak in the leg. Thinks that if he would have been allowed to move sooner he would not be in this position. No fevers overnight. Brisk urine output. No pain. Otherwise feels fine and is tolerating PO intake. He would like to advance his diet. Discussed the plan to continue current therapies while he works with PT. Will hopefully be able to go to CIR within the next 1-2 days.   Objective: Vital signs in last 24 hours: Vitals:   01/25/19 0700 01/25/19 0740 01/25/19 0800 01/25/19 0900  BP: 90/65 (!) 146/84 (!) 151/84 129/85  Pulse: (!) 102 (!) 105 (!) 101 (!) 104  Resp:  (!) 21 (!) 25 (!) 23  Temp:  98 F (36.7 C)    TempSrc:  Oral    SpO2: 95% 95% 100% 99%  Weight:      Height:       General: Elderly male in no acute distress Pulm: Good air movement with no wheezing or crackles  CV: RRR, no murmurs, no rubs  Extremities: Significant improvement in his LE edema.   Assessment/Plan:  Frederick Moody is a 63 y.o male with uncontrolled DM and alcohol use disorder who presented to the ED on 01/07/19 with complaints of generalized weakness and a fall. Initial labs were consistent with DKA and blood cultures subsequently grew MSSA. He was initially managed on the floors; however, required escalating doses of ativan for alcohol withdrawal and was unable to protect his airway. He was intubated and transferred to the ICU on 01/11/19. He was stabilized, extubated, and subsequently transferred back to IMTS on 8/1 for continued management.   MSSA Bacteremia 2/2 soft tissue abscess (upper back) Myositis and LUE cellulitis  - S/p I&D on 7/20  - Negative blood cultures 7/20  - Continue Ancef through 02/24/2019 - ID has signed off.  - Will need wound vac for neck prior to discharge to rehab. Current dressing is clean and dry   Acute Hypoxic Respiratory Failure  Steno PNA  - Extubated on 7/29 - Wean oxygen as tolerated to maintain SaO2 > 88% - Completed 5 day  course of Levaquin  Encephalopathy  EtOH withdrawal  - Continue Zyprexa  - Wean clonidine   Uncontrolled DM  - CBG at goal of <180  - Hypoglycemic overnight  - Off tube feeds. Change Levemir 14 units QD - Change to SSI, moderate TID WC  Superficial Venous Thrombosis  - No LE VTE on duplex 7/31 - Bilateral superficial cephalic vein thrombosis 0/35 - No need to anticoagulate - Monitor   Anemia of critical illness  - Hgb at 8.7 s/p 1 unit pRBCs - Continue to monitor   Deconditioning 2/2 Critical Illness  - PT/OT recommending CIR - Up to chair TID   Dysphagia  - Continue to work with speech  - Currently dysphagia 2 diet  Dispo: Anticipated discharge in approximately 1-2 day(s) if CIR accepts.   Ina Homes, MD 01/25/2019, 10:02 AM

## 2019-01-25 NOTE — Progress Notes (Signed)
  Speech Language Pathology Treatment: Dysphagia  Patient Details Name: Frederick Moody MRN: [id number] DOB: 1955-07-13 Today's Date: 01/25/2019 Time: 5102-5852 SLP Time Calculation (min) (ACUTE ONLY): 12 min  Assessment / Plan / Recommendation Clinical Impression  Pt reports tolerating sips of thin liquids intermittently, recalls recommendation to cough forcefully when needed from MBS. SLP observed pt to take small single sips of thin liquids independently without immediate signs of aspiration. However when encouraged to take consecutive sips immediate cough occurred, likely indicative of sensed penetration/aspiration. Pt verbalized understanding of need to take small single sips. Also discussed the value of intermittent throat clearing. Will allow pt to upgrade to thin liquids and regular solids with use of aspiration precautions. Will f/u for tolerance.   HPI HPI: Pt is a 63 yo male with hx of uncontrolled DM and EtOH abuse who presents with weakness and falls. Pt admitted with DKA, sepsis with MSSA bacteremia and abscess on back (s/p I&D 7/21), AMS with EtOH withdrawal and possible benzo OD and aspiration requiring intubation 7/19-7/24.       SLP Plan  Continue with current plan of care       Recommendations  Diet recommendations: Regular;Thin liquid Liquids provided via: Cup;Straw Medication Administration: Whole meds with puree Supervision: Patient able to self feed Compensations: Slow rate;Small sips/bites;Clear throat intermittently Postural Changes and/or Swallow Maneuvers: Seated upright 90 degrees;Upright 30-60 min after meal                SLP Visit Diagnosis: Dysphagia, oropharyngeal phase (R13.12) Plan: Continue with current plan of care       GO               Herbie Baltimore, MA [place] Pager [phone] Office [phone]  Lynann Beaver 01/25/2019, 1:02 PM

## 2019-01-25 NOTE — Progress Notes (Signed)
Pt tx to [facility] and chair at 1000 and back to bed at 11 per request. Required gait belt and significant support to stand and pivot to and from bed. BUE much stronger than LE.

## 2019-01-26 LAB — CBC
HCT: 25.8 % — ABNORMAL LOW (ref 39.0–52.0)
Hemoglobin: 8.6 g/dL — ABNORMAL LOW (ref 13.0–17.0)
MCH: 31.9 pg (ref 26.0–34.0)
MCHC: 33.3 g/dL (ref 30.0–36.0)
MCV: 95.6 fL (ref 80.0–100.0)
Platelets: 236 10*3/uL (ref 150–400)
RBC: 2.7 MIL/uL — ABNORMAL LOW (ref 4.22–5.81)
RDW: 14.3 % (ref 11.5–15.5)
WBC: 6.2 10*3/uL (ref 4.0–10.5)
nRBC: 0 % (ref 0.0–0.2)

## 2019-01-26 LAB — RENAL FUNCTION PANEL
Albumin: 1.9 g/dL — ABNORMAL LOW (ref 3.5–5.0)
Anion gap: 12 (ref 5–15)
BUN: 18 mg/dL (ref 8–23)
CO2: 30 mmol/L (ref 22–32)
Calcium: 8.4 mg/dL — ABNORMAL LOW (ref 8.9–10.3)
Chloride: 95 mmol/L — ABNORMAL LOW (ref 98–111)
Creatinine, Ser: 0.83 mg/dL (ref 0.61–1.24)
GFR calc Af Amer: >60 mL/min (ref >60)
GFR calc non Af Amer: >60 mL/min (ref >60)
Glucose, Bld: 91 mg/dL (ref 70–99)
Phosphorus: 4.1 mg/dL (ref 2.5–4.6)
Potassium: 2.9 mmol/L — ABNORMAL LOW (ref 3.5–5.1)
Sodium: 137 mmol/L (ref 135–145)

## 2019-01-26 LAB — GLUCOSE, CAPILLARY
Glucose-Capillary: 71 mg/dL (ref 70–99)
Glucose-Capillary: 121 mg/dL — ABNORMAL HIGH (ref 70–99)
Glucose-Capillary: 191 mg/dL — ABNORMAL HIGH (ref 70–99)
Glucose-Capillary: 155 mg/dL — ABNORMAL HIGH (ref 70–99)
Glucose-Capillary: 139 mg/dL — ABNORMAL HIGH (ref 70–99)

## 2019-01-26 MED ORDER — IBUPROFEN 100 MG/5ML PO SUSP
400.0000 mg | Freq: Four times a day (QID) | ORAL | Status: DC | PRN
  Administered 2019-01-28 – 2019-02-07 (×7): 400 mg via ORAL
  Filled 2019-01-26 (×13): qty 20

## 2019-01-26 MED ORDER — FOLIC ACID 1 MG PO TABS
1.0000 mg | ORAL_TABLET | Freq: Every day | ORAL | Status: DC
  Administered 2019-01-27 – 2019-02-09 (×14): 1 mg via ORAL
  Filled 2019-01-26 (×14): qty 1

## 2019-01-26 MED ORDER — VITAMIN B-1 100 MG PO TABS
100.0000 mg | ORAL_TABLET | Freq: Every day | ORAL | Status: DC
  Administered 2019-01-27 – 2019-02-09 (×14): 100 mg via ORAL
  Filled 2019-01-26 (×14): qty 1

## 2019-01-26 MED ORDER — OLANZAPINE 5 MG PO TABS
5.0000 mg | ORAL_TABLET | Freq: Every day | ORAL | Status: DC
  Administered 2019-01-26 – 2019-02-08 (×13): 5 mg via ORAL
  Filled 2019-01-26 (×16): qty 1

## 2019-01-26 MED ORDER — MELATONIN 3 MG PO TABS
3.0000 mg | ORAL_TABLET | Freq: Every evening | ORAL | Status: DC | PRN
  Administered 2019-01-26 – 2019-02-08 (×5): 3 mg via ORAL
  Filled 2019-01-26 (×9): qty 1

## 2019-01-26 MED ORDER — POTASSIUM CHLORIDE CRYS ER 20 MEQ PO TBCR
40.0000 meq | EXTENDED_RELEASE_TABLET | Freq: Two times a day (BID) | ORAL | Status: AC
  Administered 2019-01-26 (×2): 40 meq via ORAL
  Filled 2019-01-26 (×2): qty 2

## 2019-01-26 MED ORDER — ADULT MULTIVITAMIN W/MINERALS CH
1.0000 | ORAL_TABLET | Freq: Every day | ORAL | Status: DC
  Administered 2019-01-27 – 2019-02-09 (×14): 1 via ORAL
  Filled 2019-01-26 (×15): qty 1

## 2019-01-26 NOTE — Progress Notes (Signed)
  Speech Language Pathology Treatment: Dysphagia  Patient Details Name: Frederick Moody MRN: [id number] DOB: 1956-03-06 Today's Date: 01/26/2019 Time: 1215-1230 SLP Time Calculation (min) (ACUTE ONLY): 15 min  Assessment / Plan / Recommendation Clinical Impression  SLP followed up for diet tolerance, since most recent diet upgrade to regular thin liquid diet. RN and pt deny dysphagia difficulty this date with POs. Pt assessed with thin liquids via cup and straw. No overt s/sx of aspiration were exhibited. Pt declined PO snack, noted he was awaiting lunch to come. SLP provided pt with oral care supplies, mild coating noted on posterior portion of lingual area. Safe swallowing precautions were reviewed. No further ST needs identified.    HPI HPI: Pt is a 63 yo male with hx of uncontrolled DM and EtOH abuse who presents with weakness and falls. Pt admitted with DKA, sepsis with MSSA bacteremia and abscess on back (s/p I&D 7/21), AMS with EtOH withdrawal and possible benzo OD and aspiration requiring intubation 7/19-7/24.       SLP Plan  Continue with current plan of care       Recommendations  Diet recommendations: Thin liquid;Regular Liquids provided via: Cup;Straw Medication Administration: Whole meds with puree Supervision: Patient able to self feed Compensations: Slow rate;Small sips/bites;Clear throat intermittently Postural Changes and/or Swallow Maneuvers: Seated upright 90 degrees;Upright 30-60 min after meal                Oral Care Recommendations: Oral care BID Follow up Recommendations: 24 hour supervision/assistance;Skilled Nursing facility SLP Visit Diagnosis: Dysphagia, oropharyngeal phase (R13.12) Plan: No further ST needs identified, ST to reassess as clinically appropriate       GO                Everley Evora E Dawnmarie Breon MA, [place] 01/26/2019, 12:31 PM

## 2019-01-26 NOTE — Progress Notes (Addendum)
   Subjective: Patient was awake and oriented this morning. He states that he feels better than he did yesterday. He does have some concern with his right lower extremity swelling. He states that the occasional massage helps alleviate the pain. He also complains of numbness in his feet bilaterally. He postulates that it could be due to a fungal infection, after seeing a Dr. Irena Cords episode yesterday, vs his DM.   Objective:  Vital signs in last 24 hours: Vitals:   01/26/19 0500 01/26/19 0600 01/26/19 0721 01/26/19 0800  BP:  (!) 147/78  (!) 140/96  Pulse: 97 98  98  Resp: 17 (!) 21  18  Temp:   99 F (37.2 C)   TempSrc:   Oral   SpO2: 98% 97%  100%  Weight:      Height:       Physical Exam  Constitutional: He is well-developed, well-nourished, and in no distress. No distress.  Cardiovascular: Normal rate, regular rhythm, normal heart sounds and intact distal pulses. Exam reveals no gallop and no friction rub.  No murmur heard. Pulmonary/Chest: Effort normal and breath sounds normal. No respiratory distress. He has no wheezes. He has no rales. He exhibits no tenderness.  Abdominal: He exhibits no distension. There is no abdominal tenderness.  Musculoskeletal:        General: Edema present.     Comments: Edema noted in the R lower extremity.   Skin: He is not diaphoretic.    Assessment/Plan:  Principal Problem:   MSSA bacteremia Active Problems:   DKA (diabetic ketoacidoses) (HCC)   Alcohol withdrawal (HCC)   Aspiration pneumonia (HCC)   Sepsis ([place])   Acute encephalopathy   Acute respiratory failure (HCC)   Malnutrition of moderate degree   Hypokalemia   Pressure injury of skin   Palliative care encounter   Critical illness myopathy  MSSA Bacteremia 2/2 Soft Tissue Abscess Myositis and LUE Cellulitis:  - Continue Ancef through 02/24/2019 - Will need wound vac for neck before D/C to rehab. Current dressing is clean with no signs of blood or pus present  Acute Hypoxic  Respiratory Failure Steno PNA - Wean O2 as tolerated to maintain SaO2>88%  Encephalopathy/EtOH Withdrawal  - Continue Zyprexa  - Wean clonidine   DM  - CBG goal:  <180 - Continue sliding scale insulin -Continue Levemir 14u daily  Dysphagia:  - Work with speech therapist - Currently on dysphagia diet - Was eating well at bedside   SVT:  - Continue to monitor  - No LE VTE per duplex 01/23/2019  Anemia of Critical Illness - HgB is 8.6 today  - Continue to monitor   Hypokalemia -K 2.9 today -KDur 29mEq x2 today  Dispo: Anticipated discharge in approximately 1-2 day(s).   [name], MD 01/26/2019, 11:03 AM Pager: [phone]

## 2019-01-27 ENCOUNTER — Inpatient Hospital Stay: Payer: Self-pay | Admitting: Critical Care Medicine

## 2019-01-27 ENCOUNTER — Encounter (HOSPITAL_COMMUNITY): Payer: Self-pay

## 2019-01-27 ENCOUNTER — Inpatient Hospital Stay (HOSPITAL_COMMUNITY): Payer: Medicaid Other

## 2019-01-27 DIAGNOSIS — I82409 Acute embolism and thrombosis of unspecified deep veins of unspecified lower extremity: Secondary | ICD-10-CM

## 2019-01-27 LAB — GLUCOSE, CAPILLARY
Glucose-Capillary: 163 mg/dL — ABNORMAL HIGH (ref 70–99)
Glucose-Capillary: 173 mg/dL — ABNORMAL HIGH (ref 70–99)
Glucose-Capillary: 176 mg/dL — ABNORMAL HIGH (ref 70–99)
Glucose-Capillary: 101 mg/dL — ABNORMAL HIGH (ref 70–99)

## 2019-01-27 LAB — CBC
HCT: 27.6 % — ABNORMAL LOW (ref 39.0–52.0)
Hemoglobin: 9 g/dL — ABNORMAL LOW (ref 13.0–17.0)
MCH: 30.8 pg (ref 26.0–34.0)
MCHC: 32.6 g/dL (ref 30.0–36.0)
MCV: 94.5 fL (ref 80.0–100.0)
Platelets: 273 10*3/uL (ref 150–400)
RBC: 2.92 MIL/uL — ABNORMAL LOW (ref 4.22–5.81)
RDW: 13.7 % (ref 11.5–15.5)
WBC: 7.1 10*3/uL (ref 4.0–10.5)
nRBC: 0 % (ref 0.0–0.2)

## 2019-01-27 LAB — RENAL FUNCTION PANEL
Albumin: 2.1 g/dL — ABNORMAL LOW (ref 3.5–5.0)
Anion gap: 13 (ref 5–15)
BUN: 13 mg/dL (ref 8–23)
CO2: 30 mmol/L (ref 22–32)
Calcium: 8.6 mg/dL — ABNORMAL LOW (ref 8.9–10.3)
Chloride: 94 mmol/L — ABNORMAL LOW (ref 98–111)
Creatinine, Ser: 0.83 mg/dL (ref 0.61–1.24)
GFR calc Af Amer: >60 mL/min (ref >60)
GFR calc non Af Amer: >60 mL/min (ref >60)
Glucose, Bld: 150 mg/dL — ABNORMAL HIGH (ref 70–99)
Phosphorus: 4.4 mg/dL (ref 2.5–4.6)
Potassium: 3.4 mmol/L — ABNORMAL LOW (ref 3.5–5.1)
Sodium: 137 mmol/L (ref 135–145)

## 2019-01-27 LAB — MAGNESIUM: Magnesium: 1.5 mg/dL — ABNORMAL LOW (ref 1.7–2.4)

## 2019-01-27 MED ORDER — IOHEXOL 300 MG/ML  SOLN
100.0000 mL | Freq: Once | INTRAMUSCULAR | Status: AC | PRN
  Administered 2019-01-27: 19:00:00 100 mL via INTRAVENOUS

## 2019-01-27 MED ORDER — FUROSEMIDE 10 MG/ML IJ SOLN
40.0000 mg | Freq: Two times a day (BID) | INTRAMUSCULAR | Status: DC
  Administered 2019-01-28 – 2019-01-31 (×7): 40 mg via INTRAVENOUS
  Filled 2019-01-27 (×7): qty 4

## 2019-01-27 MED ORDER — MAGNESIUM SULFATE 2 GM/50ML IV SOLN
2.0000 g | Freq: Once | INTRAVENOUS | Status: AC
  Administered 2019-01-27: 2 g via INTRAVENOUS
  Filled 2019-01-27 (×2): qty 50

## 2019-01-27 MED ORDER — POTASSIUM CHLORIDE 20 MEQ PO PACK
20.0000 meq | PACK | Freq: Two times a day (BID) | ORAL | Status: DC
  Administered 2019-01-27 – 2019-02-09 (×25): 20 meq via ORAL
  Filled 2019-01-27 (×27): qty 1

## 2019-01-27 MED ORDER — POTASSIUM CHLORIDE CRYS ER 20 MEQ PO TBCR
40.0000 meq | EXTENDED_RELEASE_TABLET | Freq: Once | ORAL | Status: AC
  Administered 2019-01-27: 40 meq via ORAL
  Filled 2019-01-27: qty 2

## 2019-01-27 MED ORDER — MAGNESIUM SULFATE 50 % IJ SOLN
2.0000 g | Freq: Once | INTRAMUSCULAR | Status: DC
  Filled 2019-01-27: qty 4

## 2019-01-27 NOTE — Progress Notes (Signed)
Student Pharmacist rounding with the IMTS-B1 service observed a patient with an electrolyte disturbance. The patient is a 63 year old male on day 87 of hospital admission for MSSA bacteremia. He had episodes of hypokalemia the past few days that were repleted with a potassium supplement 40 mEq. His potassium level this morning was 3.4 mg/dL. A magnesium level was checked this morning that was found to be 1.5 mg/dL. A recommendation to replete magnesium was made to the team and he was started magnesium sulfate, 2g IV for one dose. There is a relationship between hypokalemia and hypomagnesemia to which normal magnesium levels help maintain a normal potassium. Continue to monitor the CMP with magnesium for normal electrolyte levels.  Youlanda Roys, 4th year Stage manager

## 2019-01-27 NOTE — Progress Notes (Signed)
Right lower extremity venous duplex completed. Refer to "CV Proc" under chart review to view preliminary results.  Preliminary results discussed with Dr. Heber Sabana E[place].  01/27/2019 4:33 PM Maudry Mayhew, MHA, RVT, RDCS, RDMS

## 2019-01-27 NOTE — Progress Notes (Signed)
Chart reviewed. B Veora Fonte RN,MHA,BSN Advanced Care Supervisor 

## 2019-01-27 NOTE — TOC Initial Note (Addendum)
Transition of Care [facility]) - Initial/Assessment Note    Patient Details  Name: Frederick Moody MRN: [id number] Date of Birth: [birth year]  [facility]) CM/SW Contact:    Bartholomew Crews, RN Phone Number: [phone] 01/27/2019, 10:11 AM  Clinical Narrative:                 PTA patient home with family. No insurance. No PCP. Daughter has initiated Medicaid and disability application. FC following. Patient now extubated. Patient had difficulty protecting airway d/t ETOH withdrawal symptoms.  IV antibiotics until 9/1 d/t MSSA bacteremia from wound in upper back which may require wound vac.  Palliative consult - patient now DNR - does not want feeding tube or trach. ST following for swallow recommendations. CIR following. Family is supportive. Sister and daughter are HCPOA. CM following for transition of care needs.   Expected Discharge Plan: IP Rehab Facility Barriers to Discharge: Continued Medical Work up   Patient Goals and CMS Choice        Expected Discharge Plan and Services Expected Discharge Plan: [place]                                              Prior Living Arrangements/Services                       Activities of Daily Living Home Assistive Devices/Equipment: None ADL Screening (condition at time of admission) Patient's cognitive ability adequate to safely complete daily activities?: Yes Is the patient deaf or have difficulty hearing?: No Does the patient have difficulty seeing, even when wearing glasses/contacts?: No Does the patient have difficulty concentrating, remembering, or making decisions?: No Patient able to express need for assistance with ADLs?: Yes Does the patient have difficulty dressing or bathing?: No Independently performs ADLs?: Yes (appropriate for developmental age) Does the patient have difficulty walking or climbing stairs?: No Weakness of Legs: None Weakness of Arms/Hands: None  Permission  Sought/Granted                  Emotional Assessment              Admission diagnosis:  Diabetic ketoacidosis without coma associated with type 2 diabetes mellitus ([place]) [E11.10] Sepsis, due to unspecified organism, unspecified whether acute organ dysfunction present [facility]) [A41.9] Patient Active Problem List   Diagnosis Date Noted  . 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 Palliative care encounter   . Pressure injury of skin 01/17/2019  . Hypokalemia   . Malnutrition of moderate degree 01/12/2019  . Acute encephalopathy   . Acute respiratory failure ([place])   . Aspiration pneumonia ([place]) 01/10/2019  . Sepsis ([place])   . MSSA bacteremia 01/08/2019  . Alcohol withdrawal ([place]) 01/08/2019  . DKA (diabetic ketoacidoses) ([place]) 01/07/2019   PCP:  Patient, No Pcp Per Pharmacy:   [facility] ([place] [place] - [address] [place] ([place]) [place] [address] Phone: [phone] Fax: [phone]     Social Determinants of Health (SDOH) Interventions    Readmission Risk Interventions No flowsheet data found.

## 2019-01-27 NOTE — Progress Notes (Signed)
   Subjective:  Frederick Moody was awake and sitting in a wheelchair during the interview. He states that he is feeling overall okay, but does admit that his right leg is swollen and hurts when he touches it. We spoke with him and addressed his concerns. He was agreeable to having another U/S to make sure that he does not have a DVT. We also spoke with his daughter, Frederick Moody, over the phone to give her an update on her father's condition.    Objective:  Vital signs in last 24 hours: Vitals:   01/27/19 1100 01/27/19 1204 01/27/19 1300 01/27/19 1509  BP: (!) 153/84   (!) 149/90  Pulse: (!) 103  (!) 112 (!) 105  Resp:   (!) 23 20  Temp:  98.1 F (36.7 C)  98.7 F (37.1 C)  TempSrc:  Oral  Oral  SpO2: 97%  96% 100%  Weight:    86.7 kg  Height:    5\' 9"  (1.753 m)   Physical Exam Constitutional:      General: He is not in acute distress.    Appearance: Normal appearance. He is not ill-appearing, toxic-appearing or diaphoretic.  Cardiovascular:     Rate and Rhythm: Regular rhythm.     Pulses: Normal pulses.     Heart sounds: Normal heart sounds. No murmur. No friction rub. No gallop.   Pulmonary:     Effort: Pulmonary effort is normal. No respiratory distress.     Breath sounds: Normal breath sounds. No stridor. No wheezing, rhonchi or rales.  Neurological:     Mental Status: He is alert.     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  - U/S of Right lower extremity shows heterogenous area in the posterior proximal calf measuring 4.9cm.  - CT of R extremity with contrast ordered.   Acute Hypoxic Respiratory Failure Steno PNA - Maintain SaO2>88%  Encephalopathy/EtOH Withdrawal  - Continue Zyprexa    DM  - CBG goal:  <180 - Continue sliding scale insulin - Continue Levemir  Dysphagia:  - Work with speech therapist - Currently on dysphagia diet  SVT:  - Continue to monitor  - No LE VTE per duplex 01/23/2019  Anemia of Critical Illness - HgB is 8.6 today  - Continue to monitor   Hypokalemia - K: 3.4 - Potassium Chloride 20 mg ordered  Hypomagnesia  -Mg: 1.5 - Magnesium Sulfate 2g IV ordered  Dispo: Anticipated discharge pending medical course.    [name], MD 01/27/2019, 6:09 PM Pager: [phone]

## 2019-01-28 ENCOUNTER — Encounter (HOSPITAL_COMMUNITY): Payer: Self-pay | Admitting: Physician Assistant

## 2019-01-28 ENCOUNTER — Inpatient Hospital Stay (HOSPITAL_COMMUNITY): Payer: Medicaid Other

## 2019-01-28 LAB — CBC
HCT: 27.9 % — ABNORMAL LOW (ref 39.0–52.0)
Hemoglobin: 9.2 g/dL — ABNORMAL LOW (ref 13.0–17.0)
MCH: 31.1 pg (ref 26.0–34.0)
MCHC: 33 g/dL (ref 30.0–36.0)
MCV: 94.3 fL (ref 80.0–100.0)
Platelets: 256 10*3/uL (ref 150–400)
RBC: 2.96 MIL/uL — ABNORMAL LOW (ref 4.22–5.81)
RDW: 13.9 % (ref 11.5–15.5)
WBC: 6.1 10*3/uL (ref 4.0–10.5)
nRBC: 0 % (ref 0.0–0.2)

## 2019-01-28 LAB — RENAL FUNCTION PANEL
Albumin: 2.1 g/dL — ABNORMAL LOW (ref 3.5–5.0)
Anion gap: 13 (ref 5–15)
BUN: 8 mg/dL (ref 8–23)
CO2: 29 mmol/L (ref 22–32)
Calcium: 8.5 mg/dL — ABNORMAL LOW (ref 8.9–10.3)
Chloride: 95 mmol/L — ABNORMAL LOW (ref 98–111)
Creatinine, Ser: 0.78 mg/dL (ref 0.61–1.24)
GFR calc Af Amer: >60 mL/min (ref >60)
GFR calc non Af Amer: >60 mL/min (ref >60)
Glucose, Bld: 213 mg/dL — ABNORMAL HIGH (ref 70–99)
Phosphorus: 3.8 mg/dL (ref 2.5–4.6)
Potassium: 3.5 mmol/L (ref 3.5–5.1)
Sodium: 137 mmol/L (ref 135–145)

## 2019-01-28 LAB — GLUCOSE, CAPILLARY
Glucose-Capillary: 170 mg/dL — ABNORMAL HIGH (ref 70–99)
Glucose-Capillary: 146 mg/dL — ABNORMAL HIGH (ref 70–99)
Glucose-Capillary: 126 mg/dL — ABNORMAL HIGH (ref 70–99)
Glucose-Capillary: 225 mg/dL — ABNORMAL HIGH (ref 70–99)

## 2019-01-28 LAB — MAGNESIUM: Magnesium: 1.7 mg/dL (ref 1.7–2.4)

## 2019-01-28 MED ORDER — ENOXAPARIN SODIUM 40 MG/0.4ML ~~LOC~~ SOLN
40.0000 mg | SUBCUTANEOUS | Status: DC
  Administered 2019-01-29 – 2019-02-08 (×11): 40 mg via SUBCUTANEOUS
  Filled 2019-01-28 (×11): qty 0.4

## 2019-01-28 MED ORDER — LIDOCAINE HCL (PF) 1 % IJ SOLN
INTRAMUSCULAR | Status: AC
  Administered 2019-01-28: 17:00:00
  Filled 2019-01-28: qty 30

## 2019-01-28 MED ORDER — PRO-STAT SUGAR FREE PO LIQD
30.0000 mL | Freq: Three times a day (TID) | ORAL | Status: DC
  Administered 2019-01-28 – 2019-02-09 (×34): 30 mL via ORAL
  Filled 2019-01-28 (×35): qty 30

## 2019-01-28 MED ORDER — ENSURE ENLIVE PO LIQD
237.0000 mL | Freq: Three times a day (TID) | ORAL | Status: DC
  Administered 2019-01-28: 237 mL via ORAL

## 2019-01-28 NOTE — H&P (Signed)
Chief Complaint: Right calf pain  Referring Physician(s): Charma Igo, PA-C  Supervising Physician: Gilmer Mor  Patient Status: [facility] - In-pt  History of Present Illness: Frederick Moody is a 63 y.o. male who was admitted about 3 weeks ago with DKA.  He has undergone drainage of a posterior neck abscess by Gen Surg.  He developed MSSA bacteremia.   Ortho was consulted at that time for LUE myositis that eventually resolved.   He had to be intubated and was extubated on 7/29.   He had significant BLE edema that had been resolving but c/o continued edema in the right leg on 8/3.  CT showed= There is a large peripherally enhancing fluid collection within the lateral head of the gastrocnemius muscle. This extends from just above the knee into the mid lower leg, measuring approximately 22 cm in length. The collection measures up to 8.3 x 4.1 cm transverse on image 358/4 and demonstrates mild internal heterogeneity. There is no internal gas or foreign body. There is no significant soft tissue component associated with this fluid collection.  We are asked to aspirate this abscess. He is not NPO. Lovenox held.  Past Medical History:  Diagnosis Date   Diabetes mellitus without complication (HCC)    ETOH abuse     Past Surgical History:  Procedure Laterality Date   APPENDECTOMY     IRRIGATION AND DEBRIDEMENT ABSCESS N/A 01/13/2019   Procedure: IRRIGATION AND DEBRIDEMENT OF BACK ABSCESS;  Surgeon: Manus Rudd, MD;  Location: [facility] OR;  Service: General;  Laterality: N/A;    Allergies: Patient has no known allergies.  Medications: Prior to Admission medications   Medication Sig Start Date End Date Taking? Authorizing Provider  naproxen sodium (ALEVE) 220 MG tablet Take 220-440 mg by mouth 2 (two) times daily as needed (for pain).   Yes [provider]  metFORMIN (GLUCOPHAGE) 500 MG tablet Take 1 tablet (500 mg total) by mouth 2 (two) times daily  with a meal. Patient not taking: Reported on 01/07/2019 02/19/18   Geoffery Lyons, MD     History reviewed. No pertinent family history.  Social History   Socioeconomic History   Marital status: Single    Spouse name: Not on file   Number of children: Not on file   Years of education: Not on file   Highest education level: Not on file  Occupational History   Not on file  Social Needs   Financial resource strain: Not on file   Food insecurity    Worry: Not on file    Inability: Not on file   Transportation needs    Medical: Not on file    Non-medical: Not on file  Tobacco Use   Smoking status: Never Smoker   Smokeless tobacco: Never Used  Substance and Sexual Activity   Alcohol use: Not Currently   Drug use: Never   Sexual activity: Not on file  Lifestyle   Physical activity    Days per week: Not on file    Minutes per session: Not on file   Stress: Not on file  Relationships   Social connections    Talks on phone: Not on file    Gets together: Not on file    Attends religious service: Not on file    Active member of club or organization: Not on file    Attends meetings of clubs or organizations: Not on file    Relationship status: Not on file  Other Topics Concern  Not on file  Social History Narrative   Not on file     Review of Systems: A 12 point ROS discussed and pertinent positives are indicated in the HPI above.  All other systems are negative.  Review of Systems  Vital Signs: BP (!) 166/102 (BP Location: Left Arm) Comment: RN notified   Pulse (!) 111    Temp 98.6 F (37 C) (Oral)    Resp 20    Ht 5\' 9"  (1.753 m)    Wt 83 kg    SpO2 99%    BMI 27.02 kg/m   Physical Exam Vitals signs reviewed.  Constitutional:      Appearance: Normal appearance.  HENT:     Head: Normocephalic and atraumatic.  Neck:     Musculoskeletal: Normal range of motion.  Cardiovascular:     Rate and Rhythm: Normal rate and regular rhythm.  Abdominal:      Palpations: Abdomen is soft.  Musculoskeletal:        General: Swelling present. No tenderness.     Right lower leg: Edema present.     Comments: Mild calf erythema, non-tender likely secondary to neuropathy.  Neurological:     General: No focal deficit present.     Mental Status: He is alert and oriented to person, place, and time.  Psychiatric:        Mood and Affect: Mood normal.        Behavior: Behavior normal.        Thought Content: Thought content normal.        Judgment: Judgment normal.     Imaging: Dg Elbow Complete Left (3+view)  Result Date: 01/11/2019 CLINICAL DATA:  Left elbow swelling. EXAM: LEFT ELBOW - COMPLETE 3+ VIEW COMPARISON:  None. FINDINGS: There is no evidence of fracture, dislocation, or joint effusion. There is no evidence of arthropathy or other focal bone abnormality. Soft tissues are unremarkable. IMPRESSION: Negative. Electronically Signed   By: Lupita Raider M.D.   On: 01/11/2019 13:52   Dg Abd 1 View  Result Date: 01/17/2019 CLINICAL DATA:  Encounter for NG tube advancement. EXAM: ABDOMEN - 1 VIEW COMPARISON:  01/17/2019 FINDINGS: Nasogastric tube has been advanced, tip overlying the level of the proximal to mid stomach. Side port now overlies the level of the stomach. Nonobstructive bowel gas pattern. IMPRESSION: Nasogastric tube tip to mid stomach. Electronically Signed   By: Norva Pavlov M.D.   On: 01/17/2019 19:43   Dg Abd 1 View  Result Date: 01/17/2019 CLINICAL DATA:  Encounter for NG tube placement. EXAM: ABDOMEN - 1 VIEW COMPARISON:  01/15/2019 FINDINGS: Nasogastric tube is in place, tip overlying the level of the proximal stomach. Consider advancing to 10 centimeters into the mid stomach. There has been significant decompression of the stomach since the previous study. No evidence for bowel obstruction or free intraperitoneal air. IMPRESSION: Improved gastric distention.  Nonobstructive bowel gas pattern. Consider advancing nasogastric tube  further into the stomach by 10 centimeters. Electronically Signed   By: Norva Pavlov M.D.   On: 01/17/2019 12:11   Dg Abd 1 View  Result Date: 01/15/2019 CLINICAL DATA:  Orogastric tube placement EXAM: ABDOMEN - 1 VIEW COMPARISON:  01/12/2019 FINDINGS: Enteric tube is present with the distal tip and side hole within the proximal stomach. There is gaseous distention of the stomach and visualized transverse colon. No gross free intraperitoneal air. Probable small right pleural effusion. IMPRESSION: As above. Electronically Signed   By: Vernice Jefferson.D.  On: 01/15/2019 17:05   Dg Abd 1 View  Result Date: 01/12/2019 CLINICAL DATA:  Bedside gastric tube placement. EXAM: Portable ABDOMEN-1 VIEW COMPARISON:  None. FINDINGS: The gastric tube courses into the stomach with its tip in the antropyloric region. Visualized upper abdominal bowel gas pattern unremarkable. IMPRESSION: Gastric tube courses into the stomach with its tip in the antropyloric region, appropriately positioned. Electronically Signed   By: Hulan Saas M.D.   On: 01/12/2019 16:36   Ct Head Wo Contrast  Result Date: 01/12/2019 CLINICAL DATA:  Encephalopathy EXAM: CT HEAD WITHOUT CONTRAST TECHNIQUE: Contiguous axial images were obtained from the base of the skull through the vertex without intravenous contrast. COMPARISON:  02/18/2018 head CT FINDINGS: Brain: No evidence of acute infarction, hemorrhage, hydrocephalus, extra-axial collection or mass lesion/mass effect. 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 Small remote high left frontal cortex infarct. Vascular: Atherosclerotic calcification Skull: Normal. Negative for fracture or focal lesion. Sinuses/Orbits: Right maxillary sinusitis with mucosal thickening and fluid level, also seen in 2019. Partial left mastoid opacification in the setting of intubation and nasopharyngeal fluid IMPRESSION: 1. No acute finding or change from 2019. 2. Small remote infarcts. 3. Chronic  right maxillary sinusitis. Electronically Signed   By: Marnee Spring M.D.   On: 01/12/2019 04:43   Ct Chest Wo Contrast  Result Date: 01/12/2019 CLINICAL DATA:  Acute respiratory illness.  MSSA bacteremia. EXAM: CT CHEST WITHOUT CONTRAST TECHNIQUE: Multidetector CT imaging of the chest was performed following the standard protocol without IV contrast. COMPARISON:  None. FINDINGS: Cardiovascular: Normal heart size. Coronary atherosclerotic calcification. No pericardial effusion or vascular finding without contrast. Mediastinum/Nodes: Nasogastric tube tip reaches the stomach at least. Mild prominence of mediastinal lymph nodes considered reactive. Endotracheal tube in good position. Lungs/Pleura: Moderate bilateral layering pleural effusion with atelectasis of the dependent lungs, multi segment. No pulmonary edema or pneumothorax. Upper Abdomen: Negative Musculoskeletal: Subcutaneous collection in the upper midline back. Dedicated thoracic spine reformats described separately. IMPRESSION: 1. Moderate layering pleural effusions with multi segment atelectasis. 2. Dorsal cervicothoracic soft tissue inflammation as described on dedicated thoracic spine study. Electronically Signed   By: Marnee Spring M.D.   On: 01/12/2019 04:59   Ct Thoracic Spine W Wo Contrast  Result Date: 01/12/2019 CLINICAL DATA:  Pressure ulcer with draining purulence from wound EXAM: CT THORACIC SPINE WITHOUT AND WITH CONTRAST TECHNIQUE: Multidetector CT images of the thoracic was performed following the standard protocol before and during bolus administration of intravenous contrast. CONTRAST:  75mL OMNIPAQUE IOHEXOL 300 MG/ML  SOLN COMPARISON:  None. FINDINGS: Alignment: Unremarkable Vertebrae: Soft tissue inflammation extends to the spinous processes of the lower cervical and upper thoracic spine without bony erosion. No underlying discitis/osteomyelitis. Remote T4 superior endplate fracture. Paraspinal and other soft tissues:  Lobulated subcutaneous fluid collection measuring 11 x 5 x 6 cm, extending from the skin surface to the para median trapezius and rhomboids. Small volume gas is seen within the anti dependent collection which is multi septated/lobulated. Left thyroid nodule considered incidental. Atherosclerosis. Moderate pleural effusions with multi segment pulmonary collapse. Disc levels: Generalized spondylosis. Notable C5-6 right eccentric endplate and uncovertebral ridging with right foraminal impingement. IMPRESSION: 11 x 5 x 6 cm abscess in the dorsal cervicothoracic soft tissues extending from the skin surface into the superficial musculature. There is extension to spinous processes without bony erosion. Electronically Signed   By: Marnee Spring M.D.   On: 01/12/2019 05:04   Mr Humerus Left Wo Contrast  Result Date: 01/14/2019 CLINICAL  DATA:  Elbow pain.  Sepsis.  Cellulitis at the elbow. EXAM: MRI OF THE LEFT HUMERUS WITHOUT CONTRAST TECHNIQUE: Multiplanar, multisequence MR imaging of the left humerus was performed. No intravenous contrast was administered. The study is quite limited due to the patient's body habitus, agitation on the ventilator, and limited available positioning for scanning. COMPARISON:  Radiographs dated 01/11/2019 FINDINGS: Bones/Joint/Cartilage The humeral head and shaft appear normal. The distal humerus is not completely seen on this exam. There is no evidence of an glenohumeral joint effusion. No significant abnormality of the acromioclavicular joint. Muscles and Tendons and soft tissues There is extensive edema in the superficial aspect of the left deltoid muscle as well as edema along the superficial and deep aspects of muscles of the rotator cuff and around the pectoralis muscles. There is also edema surrounding involving the biceps muscle and triceps muscle. Is marked edema of the muscles of the left posterolateral chest wall and in the subcutaneous fat of the lateral aspect of the chest.  There is no definable discrete abscess. IMPRESSION: 1. Extensive edema in the muscles of the shoulder girdle and left upper arm and left lateral chest wall as well as extensive edema in the adjacent subcutaneous fat. This is consistent with severe myositis. No definable abscesses. 2. No evidence of osteomyelitis. The elbow joint is not included on this exam. The distal humerus is not included on this exam. 3. No evidence of glenohumeral joint or AC joint effusion. Electronically Signed   By: Francene Boyers M.D.   On: 01/14/2019 19:54   US Soft Tissue Head & Neck (non-thyroid)  Result Date: 01/08/2019 CLINICAL DATA:  63 year old male with possible abscess in the lower cervical/upper back region EXAM: ULTRASOUND OF HEAD/NECK SOFT TISSUES TECHNIQUE: Ultrasound examination of the head and neck soft tissues was performed in the area of clinical concern. COMPARISON:  None. FINDINGS: Sonographic interrogation of the region of clinical concern demonstrates thickened skin and normal underlying subcutaneous fat. No focal fluid collection to suggest abscess. The soft tissues are fairly dense which limits penetration and evaluation. IMPRESSION: No discrete fluid collection to suggest abscess. Electronically Signed   By: Malachy Moan M.D.   On: 01/08/2019 17:04   Mr Forearm Left Wo Contrast  Result Date: 01/14/2019 CLINICAL DATA:  Sepsis.  Cellulitis. EXAM: MRI OF THE LEFT FOREARM WITHOUT CONTRAST TECHNIQUE: Multiplanar, multisequence MR imaging of the left elbow was performed. No intravenous contrast was administered. COMPARISON:  Radiographs dated 01/11/2019 FINDINGS: Bones/Joint/Cartilage The bones of the left elbow appear normal. There is a moderate elbow joint effusion, nonspecific. Ligaments Intact. Muscles and Tendons Extensive edema in the subcutaneous fat around the elbow. Extensive intramuscular edema of the muscles around the elbow without a defined abscess. This edema is throughout the left upper arm and  extends throughout the visualized portion of the left forearm. Soft tissues There is a small amount of fluid in the olecranon bursa. Prominent subcutaneous edema in the lateral aspect of chest. IMPRESSION: 1. Extensive muscle and subcutaneous edema throughout the visualized portion of the left arm consistent with cellulitis and myositis. 2. No definable abscess. 3. Focal small fluid collection in the olecranon bursa. This could be sterile or infected. 4. Moderate elbow joint effusion, nonspecific. 5. Bones appear normal. Electronically Signed   By: Francene Boyers M.D.   On: 01/14/2019 20:09   Dg Chest Port 1 View  Result Date: 01/23/2019 CLINICAL DATA:  Pneumonia. EXAM: PORTABLE CHEST 1 VIEW COMPARISON:  12/22/2018. 12/21/2018. 12/20/2018. CT chest 01/12/2019. CT  thoracic spine 01/12/2019. FINDINGS: Interval extubation. Right PICC line and NG tube noted stable position. Cardiomegaly with bilateral pulmonary infiltrates/edema. Tiny right pleural effusion. No pneumothorax. Stable air collection over the base of the neck/upper chest, most likely related to previously identified abscess over the posterior neck. IMPRESSION: 1. Interval extubation. NG tube and right PICC line stable position. 2. Cardiomegaly with bilateral pulmonary infiltrates/edema and small right pleural effusions suggesting CHF. Bilateral pneumonia cannot be excluded. 3. Persistent air collection over the lower neck/upper chest, most likely related to previously identified abscess over the posterior neck. Electronically Signed   By: Maisie Fus  Register   On: 01/23/2019 06:06   Dg Chest Port 1 View  Result Date: 01/21/2019 CLINICAL DATA:  Respiratory failure. EXAM: PORTABLE CHEST 1 VIEW COMPARISON:  01/20/2019 and 01/19/2019 FINDINGS: Endotracheal tube tip is 5 cm above the carina. NG tube tip is in the body of the stomach. Central line tip is in the SVC at the level of the carina. There has been marked clearing of the pulmonary edema since the  study on 01/20/2019. There is persistent atelectasis or consolidation at the left base medially. Slight haziness at the right base probably represents a small effusion. Heart size and vascularity are normal. Aortic atherosclerosis. IMPRESSION: 1. Marked clearing of the pulmonary edema. 2. Persistent atelectasis or consolidation at the left base medially. 3. Probable small right effusion. 4. Aortic atherosclerosis. Electronically Signed   By: Francene Boyers M.D.   On: 01/21/2019 08:08   Dg Chest Port 1 View  Result Date: 01/20/2019 CLINICAL DATA:  63 year old male with a history of endotracheal tube EXAM: PORTABLE CHEST 1 VIEW COMPARISON:  January 19, 2019 FINDINGS: Cardiomediastinal silhouette likely unchanged given the differing position and the low lung volumes. Fullness in the central vascular with interlobular septal thickening. Increasing airspace opacities of the bilateral lungs. Partial obscuration of the bilateral hemidiaphragm. No pneumothorax. Endotracheal tube terminates 19 mm above the carina, advanced since the prior. Right IJ central venous catheter appears to terminate in the superior vena cava. Gastric tube projects over the mediastinum and out of the field of view. No displaced fracture. IMPRESSION: Endotracheal tube has been advanced in the interval, now projecting 19 mm above the carina. This may be withdrawn for more optimal positioning. Increasing airspace opacities bilaterally with interlobular septal thickening, likely a combination of edema, atelectasis/consolidation, and bilateral pleural effusions. Right IJ central venous catheter unchanged. Gastric tube unchanged. Electronically Signed   By: Gilmer Mor D.O.   On: 01/20/2019 08:34   Portable Chest X-ray  Result Date: 01/19/2019 CLINICAL DATA:  Patient status post intubation today. EXAM: PORTABLE CHEST 1 VIEW COMPARISON:  Single-view of the chest earlier today. FINDINGS: New endotracheal tube is in place with the tip in good position  just below the clavicular heads in the above the carina. Right IJ catheter and NG tube are unchanged. Right greater than left effusions and airspace disease are unchanged. No pneumothorax. Heart size is normal. IMPRESSION: Endotracheal tube in good position.  No other change. Electronically Signed   By: Drusilla Kanner M.D.   On: 01/19/2019 11:41   Dg Chest Port 1 View  Result Date: 01/19/2019 CLINICAL DATA:  Patient admitted 01/07/2019 in diabetic ketoacidosis. The patient subsequently underwent drainage of an abscess on the back 01/12/2019. EXAM: PORTABLE CHEST 1 VIEW COMPARISON:  Single-view of the chest 01/12/2019. FINDINGS: Right IJ catheter and NG tube remain in place. Endotracheal tube has been removed. Right greater than left effusions and airspace disease have  worsened. No pneumothorax. Heart size is upper normal. Aortic atherosclerosis noted. IMPRESSION: Status post extubation. Right IJ catheter and NG tube remain in place. Worsened right greater than left pleural effusions and airspace disease which could be due to atelectasis or pneumonia. Electronically Signed   By: Drusilla Kanner M.D.   On: 01/19/2019 09:55   Dg Chest Port 1 View  Result Date: 01/13/2019 CLINICAL DATA:  Acute respiratory failure EXAM: PORTABLE CHEST 1 VIEW COMPARISON:  January 12, 2019 FINDINGS: Support devices are stable. Bilateral lower lobe airspace opacities and probable small layering effusions. Heart is borderline in size. No acute bony abnormality. IMPRESSION: Bilateral lower lobe airspace opacities with layering effusions. No significant change since prior study. Electronically Signed   By: Charlett Nose M.D.   On: 01/13/2019 08:13   Dg Chest Port 1 View  Result Date: 01/12/2019 CLINICAL DATA:  Status post central line placement EXAM: PORTABLE CHEST 1 VIEW COMPARISON:  01/11/2019 FINDINGS: Endotracheal tube and gastric catheter are noted in satisfactory position. A new right jugular central line is noted at the  cavoatrial junction. No pneumothorax is seen. Bilateral pleural effusions are noted over the bases. IMPRESSION: No evidence of pneumothorax following central line placement. Bilateral pleural effusions over the bases. Electronically Signed   By: Alcide Clever M.D.   On: 01/12/2019 15:31   Dg Chest Port 1 View  Result Date: 01/11/2019 CLINICAL DATA:  Intubation EXAM: PORTABLE CHEST 1 VIEW COMPARISON:  January 11, 2019 FINDINGS: There has been interval placement of an endotracheal tube tip terminates approximately 3 cm above the carina. The enteric tube extends below the left hemidiaphragm. Again noted are diffuse multifocal bilateral airspace opacities. There are small bilateral pleural effusions. There is no pneumothorax. The heart size is stable. Aortic calcifications are noted. IMPRESSION: Lines and tubes as above. Persistent bilateral airspace opacities similar to prior study. Bilateral pleural effusions with short interval increase in size. Electronically Signed   By: Katherine Mantle M.D.   On: 01/11/2019 23:58   Dg Chest Port 1 View  Result Date: 01/11/2019 CLINICAL DATA:  Respiratory distress EXAM: PORTABLE CHEST 1 VIEW COMPARISON:  January 10, 2019 FINDINGS: There are worsening patchy bilateral airspace opacities. There may be small bilateral pleural effusions. The heart size is stable. Aortic calcifications are noted. There is no acute osseous abnormality. IMPRESSION: Worsening multifocal airspace opacities. Electronically Signed   By: Katherine Mantle M.D.   On: 01/11/2019 21:37   Dg Chest Port 1 View  Result Date: 01/10/2019 CLINICAL DATA:  63 year old male with fever, history of diabetes and alcohol abuse EXAM: PORTABLE CHEST 1 VIEW COMPARISON:  Prior chest x-ray 01/07/2019 FINDINGS: New patchy right mid and lower lung airspace opacity. Additionally, there may be some patchy airspace opacity in the retrocardiac region of the left lower lobe. Inspiratory volumes are low. There is mild vascular  congestion. Borderline cardiomegaly. Atherosclerotic calcifications present in the transverse aorta. No pneumothorax. No acute osseous abnormality. IMPRESSION: 1. New patchy airspace opacities in the right mid lung, right base and left retrocardiac region. Differential considerations include multifocal pneumonia, aspiration pneumonitis, and, in the appropriate clinical setting, atypical/viral pneumonia. 2. Cardiomegaly with mild vascular congestion but no overt pulmonary edema. 3.  Aortic Atherosclerosis (ICD10-170.0) Electronically Signed   By: Malachy Moan M.D.   On: 01/10/2019 13:11   Dg Chest Port 1 View  Result Date: 01/07/2019 CLINICAL DATA:  Sepsis EXAM: PORTABLE CHEST 1 VIEW COMPARISON:  02/18/2018 FINDINGS: Heart and mediastinal contours are within normal limits. No  focal opacities or effusions. No acute bony abnormality. IMPRESSION: No active disease. Electronically Signed   By: Charlett Nose M.D.   On: 01/07/2019 17:57   Dg Swallowing Func-speech Pathology  Result Date: 01/23/2019 Objective Swallowing Evaluation: Type of Study: MBS-Modified Barium Swallow Study  Patient Details Name: Clarnce Homan MRN: [id number] Date of Birth: October 12, 1955 Today's Date: 01/23/2019 Time: SLP Start Time (ACUTE ONLY): 1503 -SLP Stop Time (ACUTE ONLY): 1523 SLP Time Calculation (min) (ACUTE ONLY): 20 min Past Medical History: Past Medical History: Diagnosis Date  Diabetes mellitus without complication (HCC)   ETOH abuse  Past Surgical History: Past Surgical History: Procedure Laterality Date  APPENDECTOMY    IRRIGATION AND DEBRIDEMENT ABSCESS N/A 01/13/2019  Procedure: IRRIGATION AND DEBRIDEMENT OF BACK ABSCESS;  Surgeon: Manus Rudd, MD;  Location: [facility] OR;  Service: General;  Laterality: N/A; HPI: Pt is a 63 yo male with hx of uncontrolled DM and EtOH abuse who presents with weakness and falls. Pt admitted with DKA, sepsis with MSSA bacteremia and abscess on back (s/p I&D 7/21), AMS with EtOH withdrawal and  possible benzo OD and aspiration requiring intubation 7/19-7/24.  Subjective: Pt awake, alert. Assessment / Plan / Recommendation CHL IP CLINICAL IMPRESSIONS 01/23/2019 Clinical Impression Pt demonstrated mild oropharyngeal dysphagia marked by mistiming of laryngeal protective sequence. Before epiglottis could achieve full deflection thin liquid was entered laryngeal vestibule close to vocal cords (trace remained). Possible incomplete pharyngeal ROM/contraction resulted in vallecular residue ranging from mild-moderate; mild pyriform sinuses residue. Oral boluses were transferred in a piecemeal fashion with 2-4 swallows to clear oral cavity. Head positions not attempted with pt's current cognitive status. Recommend pt initiate Dys 2, nectar thick liquids, crush pills and full assist/supervision for safety.               SLP Visit Diagnosis Dysphagia, oropharyngeal phase (R13.12) Attention and concentration deficit following -- Frontal lobe and executive function deficit following -- Impact on safety and function Moderate aspiration risk   [facility] IP TREATMENT RECOMMENDATION 01/23/2019 Treatment Recommendations Therapy as outlined in treatment plan below   Prognosis 01/23/2019 Prognosis for Safe Diet Advancement (No Data) Barriers to Reach Goals Cognitive deficits Barriers/Prognosis Comment -- [facility] IP DIET RECOMMENDATION 01/23/2019 SLP Diet Recommendations Dysphagia 2 (Fine chop) solids;Nectar thick liquid Liquid Administration via Straw;Cup Medication Administration Crushed with puree Compensations Minimize environmental distractions;Lingual sweep for clearance of pocketing;Small sips/bites;Slow rate;Clear throat intermittently Postural Changes Seated upright at 90 degrees   [facility] IP OTHER RECOMMENDATIONS 01/23/2019 Recommended Consults -- Oral Care Recommendations Oral care BID Other Recommendations --   [facility] IP FOLLOW UP RECOMMENDATIONS 01/23/2019 Follow up Recommendations 24 hour supervision/assistance;Skilled Nursing facility    [facility] IP FREQUENCY AND DURATION 01/23/2019 Speech Therapy Frequency (ACUTE ONLY) min 2x/week Treatment Duration 2 weeks      [facility] IP ORAL PHASE 01/23/2019 Oral Phase Impaired Oral - Pudding Teaspoon -- Oral - Pudding Cup -- Oral - Honey Teaspoon -- Oral - Honey Cup Lingual/palatal residue;Piecemeal swallowing Oral - Nectar Teaspoon -- Oral - Nectar Cup Piecemeal swallowing;Lingual/palatal residue Oral - Nectar Straw Piecemeal swallowing Oral - Thin Teaspoon -- Oral - Thin Cup -- Oral - Thin Straw -- Oral - Puree -- Oral - Mech Soft -- Oral - Regular -- Oral - Multi-Consistency -- Oral - Pill -- Oral Phase - Comment --  [facility] IP PHARYNGEAL PHASE 01/23/2019 Pharyngeal Phase Impaired Pharyngeal- Pudding Teaspoon -- Pharyngeal -- Pharyngeal- Pudding Cup -- Pharyngeal -- Pharyngeal- Honey Teaspoon -- Pharyngeal -- Pharyngeal- Honey Cup Pharyngeal residue -  valleculae;Pharyngeal residue - pyriform;Penetration/Aspiration during swallow Pharyngeal Material enters airway, remains ABOVE vocal cords then ejected out Pharyngeal- Nectar Teaspoon -- Pharyngeal -- Pharyngeal- Nectar Cup Pharyngeal residue - valleculae Pharyngeal -- Pharyngeal- Nectar Straw Pharyngeal residue - valleculae Pharyngeal -- Pharyngeal- Thin Teaspoon -- Pharyngeal -- Pharyngeal- Thin Cup Penetration/Aspiration during swallow Pharyngeal Material enters airway, remains ABOVE vocal cords and not ejected out Pharyngeal- Thin Straw -- Pharyngeal -- Pharyngeal- Puree -- Pharyngeal -- Pharyngeal- Mechanical Soft -- Pharyngeal -- Pharyngeal- Regular Pharyngeal residue - valleculae Pharyngeal -- Pharyngeal- Multi-consistency -- Pharyngeal -- Pharyngeal- Pill -- Pharyngeal -- Pharyngeal Comment --  [facility] IP CERVICAL ESOPHAGEAL PHASE 01/23/2019 Cervical Esophageal Phase WFL Pudding Teaspoon -- Pudding Cup -- Honey Teaspoon -- Honey Cup -- Nectar Teaspoon -- Nectar Cup -- Nectar Straw -- Thin Teaspoon -- Thin Cup -- Thin Straw -- Puree -- Mechanical Soft -- Regular --  Multi-consistency -- Pill -- Cervical Esophageal Comment -- Royce Macadamia 01/23/2019, 5:28 PM Breck Coons Litaker M.Ed Sports administrator Pager [phone] Office [phone]              [place] Lt Upper Extrem Ltd Soft Tissue Non Vascular  Result Date: 01/09/2019 CLINICAL DATA:  Septic joint, rule out abscess EXAM: ULTRASOUND LEFT UPPER EXTREMITY LIMITED TECHNIQUE: Ultrasound examination of the upper extremity soft tissues was performed in the area of clinical concern. COMPARISON:  None FINDINGS: There is focal area of soft tissue edema with surrounding Doppler flow in the medial aspect of left elbow which appears to be centered in the flexor musculature concerning for myositis. No drainable fluid collection. No other fluid collection or hematoma. IMPRESSION: 1. Focal area of soft tissue edema with surrounding Doppler flow in the medial aspect of left elbow which appears to be centered in the flexor musculature concerning for myositis. If there is further clinical concern regarding septic arthritis, recommend an MRI of the left elbow. . Electronically Signed   By: Elige Ko   On: 01/09/2019 16:06   Ct Extremity Lower Right W Contrast  Result Date: 01/28/2019 CLINICAL DATA:  Soft tissue infection suspected in the lower leg. Reported possible abscess on ultrasound. EXAM: CT OF THE LOWER RIGHT EXTREMITY WITH CONTRAST TECHNIQUE: Multidetector CT imaging of the lower right extremity was performed according to the standard protocol following intravenous contrast administration. COMPARISON:  None. CONTRAST:  OMNIPAQUE IOHEXOL 300 MG/ML  SOLN FINDINGS: Bones/Joint/Cartilage The examination includes the entire right lower extremity, extending from the mid pelvis through the right foot. There is no evidence of acute fracture, dislocation or bone destruction. No significant arthropathic changes are identified at the hip, knee or ankle. There are no new large joint effusions. Ligaments Suboptimally  assessed by CT. The cruciate ligaments appear intact at the right knee. Muscles and Tendons There is a large peripherally enhancing fluid collection within the lateral head of the gastrocnemius muscle. This extends from just above the knee into the mid lower leg, measuring approximately 22 cm in length. The collection measures up to 8.3 x 4.1 cm transverse on image 358/4 and demonstrates mild internal heterogeneity. There is no internal gas or foreign body. There is no significant soft tissue component associated with this fluid collection. The thigh musculature appears normal. The extensor mechanism at the knee is intact. The ankle tendons appear intact. Soft tissues There is mild generalized subcutaneous edema throughout the right lower extremity, greatest in the distal lower leg and dorsum of the foot. No other focal fluid collections are identified. There is a  densely calcified mass in the left hemiscrotum which may be associated with the testis or just superior to it. This measures up to 2.5 cm on image 135/4. IMPRESSION: 1. Large peripherally enhancing fluid collection within the lateral head of the right gastrocnemius muscle is nonspecific in etiology and may reflect a subacute hematoma, abscess, or less likely a necrotic mass. This should be amenable to percutaneous aspiration. 2. No other focal fluid collections. There is mild subcutaneous edema throughout the right lower extremity. 3. No evidence of osteomyelitis, acute osseous findings or significant joint effusions. 4. Nonspecific peripherally calcified mass in the left scrotum. Correlate clinically. This could be evaluated with scrotal ultrasound if clinically warranted. Electronically Signed   By: Carey BullocksWilliam  Veazey M.D.   On: 01/28/2019 08:18   Vas [place] Lower Extremity Venous (dvt)  Result Date: 01/27/2019  Lower Venous Study Indications: Swelling.  Limitations: Poor ultrasound/tissue interface. Comparison Study: 01/23/2019 Performing Technologist:  Gertie FeyMichelle Simonetti MHA, RDMS, RVT, RDCS  Examination Guidelines: A complete evaluation includes B-mode imaging, spectral Doppler, color Doppler, and power Doppler as needed of all accessible portions of each vessel. Bilateral testing is considered an integral part of a complete examination. Limited examinations for reoccurring indications may be performed as noted.  +---------+---------------+---------+-----------+----------+-------+  RIGHT     Compressibility Phasicity Spontaneity Properties Summary  +---------+---------------+---------+-----------+----------+-------+  CFV       Full            Yes       Yes                             +---------+---------------+---------+-----------+----------+-------+  SFJ       Full                                                      +---------+---------------+---------+-----------+----------+-------+  FV Prox   Full                                                      +---------+---------------+---------+-----------+----------+-------+  FV Mid    Full                                                      +---------+---------------+---------+-----------+----------+-------+  FV Distal Full                                                      +---------+---------------+---------+-----------+----------+-------+  PFV       Full                                                      +---------+---------------+---------+-----------+----------+-------+  POP       Full  Yes       Yes                             +---------+---------------+---------+-----------+----------+-------+  PTV       Full                                                      +---------+---------------+---------+-----------+----------+-------+  PERO      Full                                                      +---------+---------------+---------+-----------+----------+-------+   +----+---------------+---------+-----------+----------+--------------+   LEFT Compressibility Phasicity Spontaneity Properties Summary         +----+---------------+---------+-----------+----------+--------------+  CFV                                                   Not visualized  +----+---------------+---------+-----------+----------+--------------+     Summary: Right: There is no evidence of deep vein thrombosis in the lower extremity. No cystic structure found in the popliteal fossa. There is a heterogenous area of the right posterior proximal calf measuring at least 4.9cm. Etiology is unknown. Further imaging  may be warranted if clinically indicated. Ultrasound characteristics of enlarged lymph nodes are noted in the groin. When compared to prior study, the only significant change is the presence of the heterogenous area in the right calf.  *See table(s) above for measurements and observations. Electronically signed by Waverly Ferrari MD on 01/27/2019 at 4:53:44 PM.    Final    Vas [place] Lower Extremity Venous (dvt)  Result Date: 01/25/2019  Lower Venous Study Indications: Swelling, and fever.  Limitations: Poor ultrasound/tissue interface. Performing Technologist: Gertie Fey MHA, RDMS, RVT, RDCS  Examination Guidelines: A complete evaluation includes B-mode imaging, spectral Doppler, color Doppler, and power Doppler as needed of all accessible portions of each vessel. Bilateral testing is considered an integral part of a complete examination. Limited examinations for reoccurring indications may be performed as noted.  +---------+---------------+---------+-----------+----------+--------------+  RIGHT     Compressibility Phasicity Spontaneity Properties Summary         +---------+---------------+---------+-----------+----------+--------------+  CFV       Full            No        Yes                    Pulsatile flow  +---------+---------------+---------+-----------+----------+--------------+  SFJ       Full                                                              +---------+---------------+---------+-----------+----------+--------------+  FV Prox   Full                                                             +---------+---------------+---------+-----------+----------+--------------+  FV Mid    Full                                                             +---------+---------------+---------+-----------+----------+--------------+  FV Distal Full                                                             +---------+---------------+---------+-----------+----------+--------------+  PFV       Full                                                             +---------+---------------+---------+-----------+----------+--------------+  POP       Full            No        Yes                    Pulsatile flow  +---------+---------------+---------+-----------+----------+--------------+  PTV       Full                                                             +---------+---------------+---------+-----------+----------+--------------+  PERO      Full                                                             +---------+---------------+---------+-----------+----------+--------------+   Right Technical Findings: No prior study  +---------+---------------+---------+-----------+----------+--------------+  LEFT      Compressibility Phasicity Spontaneity Properties Summary         +---------+---------------+---------+-----------+----------+--------------+  CFV       Full            No        Yes                    Pulsatile flow  +---------+---------------+---------+-----------+----------+--------------+  SFJ       Full                                                             +---------+---------------+---------+-----------+----------+--------------+  FV Prox   Full                                                             +---------+---------------+---------+-----------+----------+--------------+  FV Mid    Full                                                              +---------+---------------+---------+-----------+----------+--------------+  FV Distal Full                                                             +---------+---------------+---------+-----------+----------+--------------+  PFV       Full                                                             +---------+---------------+---------+-----------+----------+--------------+  POP       Full            No        Yes                    Pulsatile flow  +---------+---------------+---------+-----------+----------+--------------+  PTV       Full                                                             +---------+---------------+---------+-----------+----------+--------------+  PERO      Full                                                             +---------+---------------+---------+-----------+----------+--------------+     Summary: Right: There is no evidence of deep vein thrombosis in the lower extremity. No evidence of popliteal cyst. Left: There is no evidence of deep vein thrombosis in the lower extremity. No evidence of popliteal cyst.  Pulsatile bilateral lower extremity venous flow is suggestive of possible elevated right heart pressure. *See table(s) above for measurements and observations. Electronically signed by Gretta Began MD on 01/25/2019 at 8:07:55 AM.    Final    Vas [place] Upper Extremity Venous Duplex  Result Date: 01/25/2019 UPPER VENOUS STUDY  Indications: Swelling, and fever Performing Technologist: Gertie Fey MHA, RDMS, RVT, RDCS  Examination Guidelines: A complete evaluation includes B-mode imaging, spectral Doppler, color Doppler, and power Doppler as needed of all accessible portions of each vessel. Bilateral testing is considered an integral part of a complete examination. Limited examinations for reoccurring indications may be performed as noted.  Right Findings: +----------+------------+---------+-----------+----------+--------------+  RIGHT       Compressible Phasicity Spontaneous Properties    Summary      +----------+------------+---------+-----------+----------+--------------+  IJV            Full        Yes  Yes                                +----------+------------+---------+-----------+----------+--------------+  Subclavian     Full        Yes        Yes                                +----------+------------+---------+-----------+----------+--------------+  Axillary       Full        Yes        Yes                                +----------+------------+---------+-----------+----------+--------------+  Brachial       Full        Yes        Yes                                +----------+------------+---------+-----------+----------+--------------+  Radial         Full                                                      +----------+------------+---------+-----------+----------+--------------+  Ulnar          Full                                                      +----------+------------+---------+-----------+----------+--------------+  Cephalic       None                                          Acute       +----------+------------+---------+-----------+----------+--------------+  Basilic                                                  Not visualized  +----------+------------+---------+-----------+----------+--------------+  Left Findings: +----------+------------+---------+-----------+----------+-------+  LEFT       Compressible Phasicity Spontaneous Properties Summary  +----------+------------+---------+-----------+----------+-------+  IJV            Full        Yes        Yes                         +----------+------------+---------+-----------+----------+-------+  Subclavian     Full        Yes        Yes                         +----------+------------+---------+-----------+----------+-------+  Axillary       Full        Yes        Yes                         +----------+------------+---------+-----------+----------+-------+  Brachial        Full        Yes        Yes                         +----------+------------+---------+-----------+----------+-------+  Radial         Full                                               +----------+------------+---------+-----------+----------+-------+  Ulnar          Full                                               +----------+------------+---------+-----------+----------+-------+  Cephalic       None                                       Acute   +----------+------------+---------+-----------+----------+-------+  Basilic        Full                                               +----------+------------+---------+-----------+----------+-------+  Summary:  Right: No evidence of deep vein thrombosis in the upper extremity. Findings consistent with acute superficial vein thrombosis involving the right cephalic vein in the forearm.  Left: No evidence of deep vein thrombosis in the upper extremity. Findings consistent with acute superficial vein thrombosis involving the left cephalic vein above the antecubital fossa.  *See table(s) above for measurements and observations.  Diagnosing physician: Curt Jews MD Electronically signed by Curt Jews MD on 01/25/2019 at 46:08:14 AM.    Final    [place] Ekg Site Rite  Result Date: 01/22/2019 If Site Rite image not attached, placement could not be confirmed due to current cardiac rhythm.  [place] Ekg Site Rite  Result Date: 01/11/2019 If Site Rite image not attached, placement could not be confirmed due to current cardiac rhythm.   Labs:  CBC: Recent Labs    01/25/19 0422 01/26/19 0356 01/27/19 0508 01/28/19 0833  WBC 6.9 6.2 7.1 6.1  HGB 8.7* 8.6* 9.0* 9.2*  HCT 26.8* 25.8* 27.6* 27.9*  PLT 230 236 273 256    COAGS: Recent Labs    01/13/19 0434  INR 1.5*    BMP: Recent Labs    01/25/19 0422 01/26/19 0356 01/27/19 0508 01/28/19 0833  NA 138 137 137 137  K 3.4* 2.9* 3.4* 3.5  CL 96* 95* 94* 95*  CO2 30 30 30 29   GLUCOSE 140* 91 150* 213*  BUN  21 18 13 8   CALCIUM 8.8* 8.4* 8.6* 8.5*  CREATININE 0.87 0.83 0.83 0.78  GFRNONAA >60 >60 >60 >60  GFRAA >60 >60 >60 >60    LIVER FUNCTION TESTS: Recent Labs    01/15/19 0509 01/16/19 0800 01/17/19 0424 01/18/19 0321  01/25/19 0422 01/26/19 0356 01/27/19 0508 01/28/19 0833  BILITOT 0.9 2.3* 0.9 1.2  --   --   --   --   --   AST 51* 83*  50* 57*  --   --   --   --   --   ALT 30 41 31 32  --   --   --   --   --   ALKPHOS 111 120 101 89  --   --   --   --   --   PROT 5.3* 5.5* 6.0* 6.2*  --   --   --   --   --   ALBUMIN 1.1* 1.3* 1.5* 1.6*   < > 2.1* 1.9* 2.1* 2.1*   < > = values in this interval not displayed.    TUMOR MARKERS: No results for input(s): AFPTM, CEA, CA199, CHROMGRNA in the last 8760 hours.  Assessment and Plan:  Right calf fluid collection, likely abscess, in the setting of diabetes.  Will proceed with image guided aspiration as IR/US schedule allows.  Risks and benefits discussed with the patient including bleeding, infection, damage to adjacent structures, bowel perforation/fistula connection, and sepsis.  All of the patient's questions were answered, patient is agreeable to proceed. Consent signed and in chart.  Thank you for this interesting consult.  I greatly enjoyed meeting Nichola SizerRobert Norgard and look forward to participating in their care.  A copy of this report was sent to the requesting provider on this date.  Electronically Signed: Gwynneth MacleodWENDY S Ayham Word, PA-C   01/28/2019, 2:38 PM      I spent a total of 40 Minutes in face to face in clinical consultation, greater than 50% of which was counseling/coordinating care for calf aspiration.

## 2019-01-28 NOTE — Progress Notes (Signed)
Nutrition Follow-up  RD working remotely.  DOCUMENTATION CODES:   Non-severe (moderate) malnutrition in context of chronic illness  INTERVENTION:   -Continue Magic cup TID with meals, each supplement provides 290 kcal and 9 grams of protein -Continue MVI with minerals daily -Increase Ensure Enlive po to TID, each supplement provides 350 kcal and 20 grams of protein -Hormel Shake TID with meals, each supplement provides 520 kcals and 15 grams protein -30 ml Prostat TID, each supplement provides 100 kcals and 15 grams protein  NUTRITION DIAGNOSIS:   Moderate Malnutrition related to chronic illness(poorly controlled DM, EtOH abuse, abscess) as evidenced by percent weight loss, mild muscle depletion.  Ongoing  GOAL:   Patient will meet greater than or equal to 90% of their needs  Progressing   MONITOR:   PO intake, Supplement acceptance, Labs, Weight trends, Skin, I & O's  REASON FOR ASSESSMENT:   Ventilator    ASSESSMENT:   63 yo male with hx of uncontrolled DM and EtOH abuse presents with weakness and falls. Pt admitted with DKA, sepsis with MSSA bacteremia and abscess on back, AMS with EtOH withdrawal and possible benzo OD and aspiration requiring intubation.  7/15 Admit 7/19 Intubated 7/21 I&D posterior neck/upper back abscess; 15 cm x 10 cm x 5 cm 7/24 Extubated 7/27 Re-Intubated 7/29 Extubated, TF continued via NG tube 7/31 NG tube removed, MBS 8/2 s/p BSE- advanced to regular diet with thin liquids  Reviewed I/O's: +300 ml x 24 hours and -1.1 L since 01/14/19  UOP: 950 ml x 24 hours  Per MD notes, plan for IV antibiotics until 02/24/19. Therapies currently recommending CIR; pt will require wound VAC prior to d/c.   Per SLP and RN notes, pt with good tolerance of regular diet with thin liquids, however, pt with poor oral intake. Meal completion documented at 10-50%. Pt has Ensure Enlive supplements ordered; pt has been refusing supplements, however, took this  morning's dose. Per palliative care notes, pt does not desire a trach or feeding tube.   Medications reviewed and include folvite, thiamine, MVI, KCl, and lasix.   Pt with poor oral intake and would benefit from nutrient dense supplement. One Ensure Enlive supplement provides 350 kcals, 20 grams protein, and 44-45 grams of carbohydrate vs one Glucerna shake supplement, which provides 220 kcals, 10 grams of protein, and 26 grams of carbohydrate. Given pt's hx of DM, RD will continue to monitor PO intake, CBGS, and adjust supplement regimen as appropriate.   Labs reviewed: CBGS: 101-176 (inpatient orders for glycemic control are 0-15 units insulin aspart TID with meals, 0-5 units insulin aspart q HS, and 14 units insulin detemir daily).   Diet Order:   Diet Order            Diet regular Room service appropriate? Yes; Fluid consistency: Thin  Diet effective now              EDUCATION NEEDS:   Not appropriate for education at this time  Skin:  Skin Assessment: Skin Integrity Issues: Skin Integrity Issues:: Stage II Stage II: sacrum Other: neck and back abscess  Last BM:  01/25/19  Height:   Ht Readings from Last 1 Encounters:  01/27/19 5\' 9"  (1.753 m)    Weight:   Wt Readings from Last 1 Encounters:  01/28/19 83 kg    Ideal Body Weight:  72.7 kg  BMI:  Body mass index is 27.02 kg/m.  Estimated Nutritional Needs:   Kcal:  2300-2500  Protein:  130-145  grams  Fluid:  > 2.3 L    Valta Dillon A. Jimmye Norman, RD, LDN, [place] Registered Dietitian II Certified Diabetes Care and Education Specialist Pager: [phone] After hours Pager: [phone]

## 2019-01-28 NOTE — Consult Note (Signed)
Reason for Consult:RLE fluid collection Referring Physician: E Stan HeadHoffman  Darelle Hon is an 63 y.o. male.  HPI: Frederick Moody was admitted 3 weeks ago in DKA. He developed MSSA bacteremia. Ortho saw him at that time for LUE myositis that eventually resolved. He had to be intubated and was extubated on 7/29. He had had significant BLE edema that had been resolving but c/o continued edema in the right leg on 8/3. This worsened overnight and a CT was ordered yesterday which showed a large fluid collection in the posterior leg and orthopedic surgery was asked to reassess. He c/o significant pain in the lateral calf when he bears weight but otherwise the leg doesn't hurt. He denies prior hx/o.  Past Medical History:  Diagnosis Date  . Diabetes mellitus without complication (HCC)   . ETOH abuse     Past Surgical History:  Procedure Laterality Date  . APPENDECTOMY    . IRRIGATION AND DEBRIDEMENT ABSCESS N/A 01/13/2019   Procedure: IRRIGATION AND DEBRIDEMENT OF BACK ABSCESS;  Surgeon: Manus Ruddsuei, Matthew, MD;  Location: [facility] OR;  Service: General;  Laterality: N/A;    History reviewed. No pertinent family history.  Social History:  reports that he has never smoked. He has never used smokeless tobacco. He reports previous alcohol use. He reports that he does not use drugs.  Allergies: No Known Allergies  Medications: I have reviewed the patient's current medications.  Results for orders placed or performed during the hospital encounter of 01/07/19 (from the past 48 hour(s))  Glucose, capillary     Status: Abnormal   Collection Time: 01/26/19  3:56 PM  Result Value Ref Range   Glucose-Capillary 155 (H) 70 - 99 mg/dL  Glucose, capillary     Status: Abnormal   Collection Time: 01/26/19  8:03 PM  Result Value Ref Range   Glucose-Capillary 139 (H) 70 - 99 mg/dL  Renal function panel     Status: Abnormal   Collection Time: 01/27/19  5:08 AM  Result Value Ref Range   Sodium 137 135 - 145 mmol/L    Potassium 3.4 (L) 3.5 - 5.1 mmol/L   Chloride 94 (L) 98 - 111 mmol/L   CO2 30 22 - 32 mmol/L   Glucose, Bld 150 (H) 70 - 99 mg/dL   BUN 13 8 - 23 mg/dL   Creatinine, Ser 4.780.83 0.61 - 1.24 mg/dL   Calcium 8.6 (L) 8.9 - 10.3 mg/dL   Phosphorus 4.4 2.5 - 4.6 mg/dL   Albumin 2.1 (L) 3.5 - 5.0 g/dL   GFR calc non Af Amer >60 >60 mL/min   GFR calc Af Amer >60 >60 mL/min   Anion gap 13 5 - 15    Comment: Performed at [facility], [address] [place], [place] [address]  CBC     Status: Abnormal   Collection Time: 01/27/19  5:08 AM  Result Value Ref Range   WBC 7.1 4.0 - 10.5 K/uL   RBC 2.92 (L) 4.22 - 5.81 MIL/uL   Hemoglobin 9.0 (L) 13.0 - 17.0 g/dL   HCT 13.027.6 (L) 86.539.0 - 78.452.0 %   MCV 94.5 80.0 - 100.0 fL   MCH 30.8 26.0 - 34.0 pg   MCHC 32.6 30.0 - 36.0 g/dL   RDW 69.613.7 29.511.5 - 28.415.5 %   Platelets 273 150 - 400 K/uL   nRBC 0.0 0.0 - 0.2 %    Comment: Performed at [facility], [address] [place], [place] [address]  Magnesium  Status: Abnormal   Collection Time: 01/27/19  5:08 AM  Result Value Ref Range   Magnesium 1.5 (L) 1.7 - 2.4 mg/dL    Comment: Performed at [facility], [address] [place], [place] [address]  Glucose, capillary     Status: Abnormal   Collection Time: 01/27/19  7:46 AM  Result Value Ref Range   Glucose-Capillary 163 (H) 70 - 99 mg/dL  Glucose, capillary     Status: Abnormal   Collection Time: 01/27/19 12:02 PM  Result Value Ref Range   Glucose-Capillary 173 (H) 70 - 99 mg/dL  Glucose, capillary     Status: Abnormal   Collection Time: 01/27/19  5:19 PM  Result Value Ref Range   Glucose-Capillary 176 (H) 70 - 99 mg/dL  Glucose, capillary     Status: Abnormal   Collection Time: 01/27/19  9:36 PM  Result Value Ref Range   Glucose-Capillary 101 (H) 70 - 99 mg/dL  Glucose, capillary     Status: Abnormal   Collection Time: 01/28/19  7:47 AM  Result Value Ref Range   Glucose-Capillary 170 (H) 70 - 99 mg/dL  Renal function panel      Status: Abnormal   Collection Time: 01/28/19  8:33 AM  Result Value Ref Range   Sodium 137 135 - 145 mmol/L   Potassium 3.5 3.5 - 5.1 mmol/L   Chloride 95 (L) 98 - 111 mmol/L   CO2 29 22 - 32 mmol/L   Glucose, Bld 213 (H) 70 - 99 mg/dL   BUN 8 8 - 23 mg/dL   Creatinine, Ser 6.040.78 0.61 - 1.24 mg/dL   Calcium 8.5 (L) 8.9 - 10.3 mg/dL   Phosphorus 3.8 2.5 - 4.6 mg/dL   Albumin 2.1 (L) 3.5 - 5.0 g/dL   GFR calc non Af Amer >60 >60 mL/min   GFR calc Af Amer >60 >60 mL/min   Anion gap 13 5 - 15    Comment: Performed at [facility], [address] [place], [place] [address]  CBC     Status: Abnormal   Collection Time: 01/28/19  8:33 AM  Result Value Ref Range   WBC 6.1 4.0 - 10.5 K/uL   RBC 2.96 (L) 4.22 - 5.81 MIL/uL   Hemoglobin 9.2 (L) 13.0 - 17.0 g/dL   HCT 11.927.9 (L) 14.739.0 - 82.952.0 %   MCV 94.3 80.0 - 100.0 fL   MCH 31.1 26.0 - 34.0 pg   MCHC 33.0 30.0 - 36.0 g/dL   RDW 56.213.9 13.011.5 - 86.515.5 %   Platelets 256 150 - 400 K/uL   nRBC 0.0 0.0 - 0.2 %    Comment: Performed at [facility], [address] [place], [place] [address]    Ct Extremity Lower Right W Contrast  Result Date: 01/28/2019 CLINICAL DATA:  Soft tissue infection suspected in the lower leg. Reported possible abscess on ultrasound. EXAM: CT OF THE LOWER RIGHT EXTREMITY WITH CONTRAST TECHNIQUE: Multidetector CT imaging of the lower right extremity was performed according to the standard protocol following intravenous contrast administration. COMPARISON:  None. CONTRAST:  100mL OMNIPAQUE IOHEXOL 300 MG/ML  SOLN FINDINGS: Bones/Joint/Cartilage The examination includes the entire right lower extremity, extending from the mid pelvis through the right foot. There is no evidence of acute fracture, dislocation or bone destruction. No significant arthropathic changes are identified at the hip, knee or ankle. There are no new large joint effusions. Ligaments Suboptimally assessed by CT. The cruciate ligaments appear intact  at the right knee. Muscles and Tendons There is a large peripherally enhancing fluid collection within the lateral head of the gastrocnemius muscle. This extends from just above the knee into the mid lower leg, measuring approximately 22 cm in length. The collection measures up to 8.3 x 4.1 cm transverse on image 358/4 and demonstrates mild internal heterogeneity. There is no internal gas or foreign body. There is no significant soft tissue component associated with this fluid collection. The thigh musculature appears normal. The extensor mechanism at the knee is intact. The ankle tendons appear intact. Soft tissues There is mild generalized subcutaneous edema throughout the right lower extremity, greatest in the distal lower leg and dorsum of the foot. No other focal fluid collections are identified. There is a densely calcified mass in the left hemiscrotum which may be associated with the testis or just superior to it. This measures up to 2.5 cm on image 135/4. IMPRESSION: 1. Large peripherally enhancing fluid collection within the lateral head of the right gastrocnemius muscle is nonspecific in etiology and may reflect a subacute hematoma, abscess, or less likely a necrotic mass. This should be amenable to percutaneous aspiration. 2. No other focal fluid collections. There is mild subcutaneous edema throughout the right lower extremity. 3. No evidence of osteomyelitis, acute osseous findings or significant joint effusions. 4. Nonspecific peripherally calcified mass in the left scrotum. Correlate clinically. This could be evaluated with scrotal ultrasound if clinically warranted. Electronically Signed   By: Carey BullocksWilliam  Veazey M.D.   On: 01/28/2019 08:18   Vas [place] Lower Extremity Venous (dvt)  Result Date: 01/27/2019  Lower Venous Study Indications: Swelling.  Limitations: Poor ultrasound/tissue interface. Comparison Study: 01/23/2019 Performing Technologist: Gertie FeyMichelle Simonetti MHA, RDMS, RVT, RDCS  Examination  Guidelines: A complete evaluation includes B-mode imaging, spectral Doppler, color Doppler, and power Doppler as needed of all accessible portions of each vessel. Bilateral testing is considered an integral part of a complete examination. Limited examinations for reoccurring indications may be performed as noted.  +---------+---------------+---------+-----------+----------+-------+ RIGHT    CompressibilityPhasicitySpontaneityPropertiesSummary +---------+---------------+---------+-----------+----------+-------+ CFV      Full           Yes      Yes                          +---------+---------------+---------+-----------+----------+-------+ SFJ      Full                                                 +---------+---------------+---------+-----------+----------+-------+ FV Prox  Full                                                 +---------+---------------+---------+-----------+----------+-------+ FV Mid   Full                                                 +---------+---------------+---------+-----------+----------+-------+ FV DistalFull                                                 +---------+---------------+---------+-----------+----------+-------+  PFV      Full                                                 +---------+---------------+---------+-----------+----------+-------+ POP      Full           Yes      Yes                          +---------+---------------+---------+-----------+----------+-------+ PTV      Full                                                 +---------+---------------+---------+-----------+----------+-------+ PERO     Full                                                 +---------+---------------+---------+-----------+----------+-------+   +----+---------------+---------+-----------+----------+--------------+ LEFTCompressibilityPhasicitySpontaneityPropertiesSummary         +----+---------------+---------+-----------+----------+--------------+ CFV                                              Not visualized +----+---------------+---------+-----------+----------+--------------+     Summary: Right: There is no evidence of deep vein thrombosis in the lower extremity. No cystic structure found in the popliteal fossa. There is a heterogenous area of the right posterior proximal calf measuring at least 4.9cm. Etiology is unknown. Further imaging  may be warranted if clinically indicated. Ultrasound characteristics of enlarged lymph nodes are noted in the groin. When compared to prior study, the only significant change is the presence of the heterogenous area in the right calf.  *See table(s) above for measurements and observations. Electronically signed by Waverly Ferrari MD on 01/27/2019 at 4:53:44 PM.    Final     Review of Systems  Constitutional: Negative for weight loss.  HENT: Negative for ear discharge, ear pain, hearing loss and tinnitus.   Eyes: Negative for blurred vision, double vision, photophobia and pain.  Respiratory: Negative for cough, sputum production and shortness of breath.   Cardiovascular: Negative for chest pain.  Gastrointestinal: Negative for abdominal pain, nausea and vomiting.  Genitourinary: Negative for dysuria, flank pain, frequency and urgency.  Musculoskeletal: Positive for myalgias (RLE). Negative for back pain, falls, joint pain and neck pain.  Neurological: Negative for dizziness, tingling, sensory change, focal weakness, loss of consciousness and headaches.  Endo/Heme/Allergies: Does not bruise/bleed easily.  Psychiatric/Behavioral: Negative for depression, memory loss and substance abuse. The patient is not nervous/anxious.    Blood pressure 117/84, pulse (!) 115, temperature 98.7 F (37.1 C), temperature source Oral, resp. rate 17, height 5\' 9"  (1.753 m), weight 83 kg, SpO2 98 %. Physical Exam  Constitutional: He appears  well-developed and well-nourished. No distress.  HENT:  Head: Normocephalic and atraumatic.  Eyes: Conjunctivae are normal. Right eye exhibits no discharge. Left eye exhibits no discharge. No scleral icterus.  Neck: Normal range of motion.  Cardiovascular: Normal rate and regular rhythm.  Respiratory: Effort normal. No respiratory distress.  Musculoskeletal:     Comments: RLE No traumatic wounds, ecchymosis, or rash  Posteriolateral calf firm, NT, no pain with DF/PF against resistance  No knee or ankle effusion  Knee stable to varus/ valgus and anterior/posterior stress  Sens DPN, SPN, intact, TN paresthetic  Motor EHL, ext, flex, evers 5/5  DP 1+, PT 1+, 3+ pitting edema  Neurological: He is alert.  Skin: Skin is warm and dry. He is not diaphoretic.  Psychiatric: He has a normal mood and affect. His behavior is normal.    Assessment/Plan: RLE fluid collection -- Doubt abscess given lack of erythema or tenderness. Would recommend (and have ordered) fluoro-guided aspiration by radiology.    Lisette Abu, PA-C Orthopedic Surgery [phone] 01/28/2019, 11:45 AM

## 2019-01-28 NOTE — Progress Notes (Signed)
Subjective:  Frederick Moody was interviewed while he was eating breakfast at bedside. He states that his leg was still in pain, but apart from his pain he is feeling well. We spoke briefly about the results of his U/S and CT scan and the concern that it could be a possible abscess vs ruptured cyst vs necrotic tissue. We spoke about different ways of approaching this newfound result. He was reassured and all questions were answered.   Objective:  Vital signs in last 24 hours: Vitals:   01/27/19 1509 01/27/19 2031 01/28/19 0456 01/28/19 0500  BP: (!) 149/90 (!) 145/86 117/84   Pulse: (!) 105 (!) 110 (!) 115   Resp: 20 18 17    Temp: 98.7 F (37.1 C) 98.7 F (37.1 C) 98.7 F (37.1 C)   TempSrc: Oral Oral Oral   SpO2: 100% 98% 90% 98%  Weight: 86.7 kg   83 kg  Height: 5\' 9"  (1.753 m)       Physical Exam Constitutional:      General: He is not in acute distress.    Appearance: Normal appearance. He is not ill-appearing, toxic-appearing or diaphoretic.  HENT:     Head: Normocephalic and atraumatic.  Cardiovascular:     Rate and Rhythm: Regular rhythm. Tachycardia present.     Pulses: Normal pulses.     Heart sounds: Normal heart sounds. No murmur. No friction rub. No gallop.   Pulmonary:     Effort: Pulmonary effort is normal. No respiratory distress.     Breath sounds: Normal breath sounds. No stridor. No wheezing, rhonchi or rales.  Abdominal:     General: Abdomen is flat. There is no distension.     Palpations: There is no mass.  Musculoskeletal:        General: Swelling and tenderness present.     Comments: R lower extremity has noticeable swelling around the calf that is tender to palpation.   Skin:    General: Skin is warm.     Findings: No bruising, lesion or rash.  Neurological:     Mental Status: He is alert.    Assessment/Plan:  Principal Problem:   MSSA bacteremia Active Problems:   DKA (diabetic ketoacidoses) (HCC)   Alcohol withdrawal (HCC)   Aspiration  pneumonia (HCC)   Sepsis ([place])   Acute encephalopathy   Acute respiratory failure (HCC)   Malnutrition of moderate degree   Hypokalemia   Pressure injury of skin   Palliative care encounter   Critical illness myopathy  MSSA Bacteremia 2/2 Soft Tissue Abscess Myositis and LUE Cellulitis:  - Afibrile - Continue Ancef through 02/24/2019 - Wound dressing currently clean with no signs of pus or drainage.   Right Lower Extremity Edema - U/S of Rt lower extremity shows heterogenous area in the posterior proximal calf measuring 4.9cm.  - CT of R lower extremity shows large fluid collection lateral head of the right gastrocnemius muscle which could be subacute hematoma, abscess, or less likely necrotic mass.  - Orthopaedics consulted. We appreciate their recommendations.   Acute Hypoxic Respiratory Failure Steno PNA - Maintain SaO2>88%  Encephalopathy/EtOH Withdrawal  - Continue Zyprexa    DM  - CBG goal: <180 - Continue sliding scale insulin - Continue Levemir  Dysphagia:  - Work with speech therapist - Currently on dysphagia diet  SVT:  - Continue to monitor  - No LE VTE per duplex 01/23/2019  Anemia of Critical Illness - HgB is 8.6 today  - Continue to monitor  Hypokalemia -  K: 3.5  Hypomagnesia  - Add on Magnesium ordered   Dispo: Anticipated discharge depending on medical course.    Frederick Moody, Frederick Eblen, MD 01/28/2019, 10:59 AM Pager: [phone]

## 2019-01-28 NOTE — Procedures (Signed)
Interventional Radiology Procedure Note  Procedure: US guided aspirate of right calf fluid for sample. ~60cc murky fluid Complications: None Recommendations:  - follow up culture - serous drainage, may need dressing changes - Do not submerge - Routine care   Signed,  Dulcy Fanny. Earleen Newport, DO

## 2019-01-28 NOTE — Progress Notes (Addendum)
Inpatient Rehabilitation Admissions Coordinator  I spoke with patient at bedside as well as spoke with his daughter, Minette Headland and sister, Neoma Laming, by phone. I am following up since Dr. Naaman Plummer consulted on 7/27 when pt was re intubated. At that time, patient not felt a candidate once extubated, for an inpt rehab admission. I will further discuss with Dr. Naaman Plummer and follow up with acute team tomorrow. Sister also requesting follow up with financial counselor on Medicaid and disability applications. I will contact financial counselor per family request.  Danne Baxter, RN, MSN Rehab Admissions Coordinator [phone] 01/28/2019 12:08 PM   Noted pain limiting patient's participation with therapy today. Much encouragement needed to participate today. His ability to fully participate with aggressive therapy needed before I can pursue admit to CIR. Noted aspiration of right calf fluid today.

## 2019-01-28 NOTE — Progress Notes (Signed)
Physical Therapy Treatment Patient Details Name: Frederick SizerRobert Moody MRN: [id number] DOB: May 23, 1956 Today's Date: 01/28/2019    History of Present Illness Pt is a 63 y.o. male admitted 01/07/19 with weakness and falls. Worked up for DKA, sepsis with MSSA bacteremia and abscess on back; s/p I&D 7/21. Hospital course complicated by AMS with ETOH withdrawl, possible benzo OD and aspiration; ETT 7/19-7/24, reintubated 7/27-7/29. Pt with continued RLE edema; CT showed large fluid collection; plan for aspiration. PMH includes DM, ETOH abuse.   PT Comments    Pt slowly progressing with mobility. Today's session focused on standing transfer, pt requiring RW and modA+2, limited by RLE pain with minimal WB but able to tolerate prolonged standing on LLE and minA. Pt declined ambulation due to pain, able to hop minimally on LLE. Ortho PA present at end of session to examine RLE swelling; PA confirmed RLE WBAT appropriate. Pt frustrated with pain.   Follow Up Recommendations  CIR;Supervision for mobility/OOB     Equipment Recommendations  (TBD)    Recommendations for Other Services       Precautions / Restrictions Precautions Precautions: Fall Precaution Comments: painful RLE Restrictions Weight Bearing Restrictions: No    Mobility  Bed Mobility Overal bed mobility: Needs Assistance Bed Mobility: Supine to Sit;Sit to Supine     Supine to sit: Mod assist Sit to supine: Min assist   General bed mobility comments: neeed max encouragement.  Pts bed and lines very tangled.  Used that as motivation to get pt to sit EOB and then stand  Transfers Overall transfer level: Needs assistance Equipment used: Rolling walker (2 wheeled) Transfers: Sit to/from Stand Sit to Stand: Mod assist;+2 physical assistance         General transfer comment: ModA+2 to assist trunk elevation and steady RW; pt with minimal WB through RLE, heavy reliance on BUE support  Ambulation/Gait             General  Gait Details: Pt declined ambulation due to RLE pain; able to hop minimally on LLE towards HOB with RW and minA+2   Stairs             Wheelchair Mobility    Modified Rankin (Stroke Patients Only)       Balance Overall balance assessment: Needs assistance Sitting-balance support: Feet supported;Bilateral upper extremity supported Sitting balance-Leahy Scale: Fair     Standing balance support: Bilateral upper extremity supported Standing balance-Leahy Scale: Poor Standing balance comment: Pts RLE VERY painful with any weightbearing.  Pt did better with second stand in which pt did into put weight on RLE                            Cognition Arousal/Alertness: Awake/alert Behavior During Therapy: Flat affect Overall Cognitive Status: No family/caregiver present to determine baseline cognitive functioning Area of Impairment: Attention;Following commands;Memory;Orientation;Safety/judgement;Awareness;Problem solving                 Orientation Level: Disoriented to;Situation;Time Current Attention Level: Sustained Memory: Decreased short-term memory Following Commands: Follows one step commands with increased time Safety/Judgement: Decreased awareness of safety;Decreased awareness of deficits Awareness: Intellectual Problem Solving: Slow processing;Decreased initiation;Requires verbal cues;Requires tactile cues General Comments: Pt frustrated during OT/PT session. Hard to reason with .  Pt did calm down when MD came in to discuss RLE.      Exercises General Exercises - Lower Extremity Ankle Circles/Pumps: AROM;Right Straight Leg Raises: AROM;Right;Supine Hip Flexion/Marching: AROM;Right;Supine  General Comments        Pertinent Vitals/Pain Pain Assessment: Faces Pain Score: 8  Faces Pain Scale: Hurts little more Pain Location: RLE with WB Pain Descriptors / Indicators: Discomfort;Shooting;Sharp Pain Intervention(s): Limited activity within  patient's tolerance;Repositioned    Home Living                      Prior Function            PT Goals (current goals can now be found in the care plan section) Acute Rehab PT Goals Patient Stated Goal: Get rid of RLE pain PT Goal Formulation: With patient Time For Goal Achievement: 01/31/19 Potential to Achieve Goals: Fair Progress towards PT goals: Progressing toward goals    Frequency    Min 3X/week      PT Plan Current plan remains appropriate    Co-evaluation PT/OT/SLP Co-Evaluation/Treatment: Yes Reason for Co-Treatment: For patient/therapist safety;To address functional/ADL transfers(Limited by pain) PT goals addressed during session: Mobility/safety with mobility        AM-PAC PT "6 Clicks" Mobility   Outcome Measure  Help needed turning from your back to your side while in a flat bed without using bedrails?: A Lot Help needed moving from lying on your back to sitting on the side of a flat bed without using bedrails?: A Little Help needed moving to and from a bed to a chair (including a wheelchair)?: A Lot Help needed standing up from a chair using your arms (e.g., wheelchair or bedside chair)?: A Lot Help needed to walk in hospital room?: A Lot Help needed climbing 3-5 steps with a railing? : Total 6 Click Score: 12    End of Session Equipment Utilized During Treatment: Gait belt Activity Tolerance: Patient limited by pain Patient left: in bed;with call bell/phone within reach;with bed alarm set Nurse Communication: Mobility status PT Visit Diagnosis: Other abnormalities of gait and mobility (R26.89);Muscle weakness (generalized) (M62.81);Difficulty in walking, not elsewhere classified (R26.2)     Time: 7035-0093 PT Time Calculation (min) (ACUTE ONLY): 25 min  Charges:  $Therapeutic Activity: 8-22 mins                    Mabeline Caras, PT, DPT Acute Rehabilitation Services  Pager [phone] Office [place] 01/28/2019, 3:41 PM

## 2019-01-28 NOTE — Progress Notes (Signed)
Occupational Therapy Treatment Patient Details Name: Frederick SizerRobert Moody MRN: [id number] DOB: Nov 25, 1955 Today's Date: 01/28/2019    History of present illness Pt is a 63 yo male with hx of uncontrolled DM and EtOH abuse who presents with weakness and falls. Pt admitted with DKA, sepsis with MSSA bacteremia and abscess on back (s/p I&D 7/21), AMS with EtOH withdrawal and possible benzo OD and aspiration requiring intubation 7/19-7/24. Re-intubated 7/27-7/29.    OT comments  Pt sat EOB with OT and PT and did stand twice.  Needed MUCH encouragement. Pt frustrated this day but does see, to have a lack of understanding to situation.  Pt became frustrated with therapy working to explain benefits of getting OOB.    Follow Up Recommendations  CIR;Supervision/Assistance - 24 hour    Equipment Recommendations  Other (comment)(defer to next venue)    Recommendations for Other Services      Precautions / Restrictions Precautions Precautions: Fall Precaution Comments: painful RLE       Mobility Bed Mobility Overal bed mobility: Needs Assistance Bed Mobility: Supine to Sit;Sit to Supine     Supine to sit: Mod assist Sit to supine: Max assist   General bed mobility comments: neeed max encouragement.  Pts bed and lines very tangled.  Used that as morivation to get pt to sit EOB and then stand  Transfers Overall transfer level: Needs assistance Equipment used: Rolling walker (2 wheeled) Transfers: Sit to/from Stand Sit to Stand: +2 physical assistance;Mod assist;+2 safety/equipment         General transfer comment: sit to stand only- did not transfer    Balance Overall balance assessment: Needs assistance Sitting-balance support: Feet supported;Bilateral upper extremity supported Sitting balance-Leahy Scale: Poor     Standing balance support: Bilateral upper extremity supported Standing balance-Leahy Scale: Poor Standing balance comment: Pts RLE VERY painful with any  weightbearing.  Pt did better with second stand in which pt did into put weight on RLE                           ADL either performed or assessed with clinical judgement   ADL Overall ADL's : Needs assistance/impaired                             Toileting- Clothing Manipulation and Hygiene: Moderate assistance;+2 for safety/equipment;+2 for physical assistance;Sit to/from stand Toileting - Clothing Manipulation Details (indicate cue type and reason): sit to stand only       General ADL Comments: Pt frustrated with situation.  WIth encouragement pt able to sit EOB and perform sit to stand briefly.     Vision Patient Visual Report: No change from baseline            Cognition Arousal/Alertness: Awake/alert Behavior During Therapy: Flat affect Overall Cognitive Status: Impaired/Different from baseline Area of Impairment: Attention;Following commands;Memory;Orientation;Safety/judgement;Awareness;Problem solving                 Orientation Level: Disoriented to;Situation;Time Current Attention Level: Focused Memory: Decreased short-term memory Following Commands: Follows one step commands with increased time Safety/Judgement: Decreased awareness of safety;Decreased awareness of deficits Awareness: Intellectual Problem Solving: Slow processing;Decreased initiation;Requires verbal cues;Requires tactile cues General Comments: Pt frustrated during OT/PT session. Hard to reason with .  Pt did calm down when MD came in to discuss RLE.  Pertinent Vitals/ Pain       Pain Assessment: Faces Pain Score: 8  Pain Location: RLE with WB Pain Descriptors / Indicators: Discomfort;Shooting;Sharp Pain Intervention(s): Limited activity within patient's tolerance;Monitored during session;Repositioned         Frequency  Min 2X/week        Progress Toward Goals  OT Goals(current goals can now be found in the care plan section)   Progress towards OT goals: Progressing toward goals(slowly)     Plan Discharge plan remains appropriate    Co-evaluation                 AM-PAC OT "6 Clicks" Daily Activity     Outcome Measure   Help from another person eating meals?: A Little Help from another person taking care of personal grooming?: A Lot Help from another person toileting, which includes using toliet, bedpan, or urinal?: Total Help from another person bathing (including washing, rinsing, drying)?: A Lot Help from another person to put on and taking off regular upper body clothing?: A Lot Help from another person to put on and taking off regular lower body clothing?: Total 6 Click Score: 11    End of Session Equipment Utilized During Treatment: Gait belt;Rolling walker;Oxygen(3L)  OT Visit Diagnosis: Unsteadiness on feet (R26.81);Muscle weakness (generalized) (M62.81)   Activity Tolerance Patient tolerated treatment well   Patient Left in bed;with call bell/phone within reach;with bed alarm set   Nurse Communication Mobility status        Time: 0932-6712 OT Time Calculation (min): 22 min  Charges: OT General Charges $OT Visit: 1 Visit OT Treatments $Self Care/Home Management : 8-22 mins  Kari Baars, Alvo [phone] Office- [phone]      Tobiah Celestine, Edwena Felty D 01/28/2019, 2:39 PM

## 2019-01-29 LAB — CBC
HCT: 27.2 % — ABNORMAL LOW (ref 39.0–52.0)
Hemoglobin: 8.7 g/dL — ABNORMAL LOW (ref 13.0–17.0)
MCH: 30.9 pg (ref 26.0–34.0)
MCHC: 32 g/dL (ref 30.0–36.0)
MCV: 96.5 fL (ref 80.0–100.0)
Platelets: 270 10*3/uL (ref 150–400)
RBC: 2.82 MIL/uL — ABNORMAL LOW (ref 4.22–5.81)
RDW: 14 % (ref 11.5–15.5)
WBC: 6.3 10*3/uL (ref 4.0–10.5)
nRBC: 0 % (ref 0.0–0.2)

## 2019-01-29 LAB — GLUCOSE, CAPILLARY
Glucose-Capillary: 251 mg/dL — ABNORMAL HIGH (ref 70–99)
Glucose-Capillary: 179 mg/dL — ABNORMAL HIGH (ref 70–99)
Glucose-Capillary: 196 mg/dL — ABNORMAL HIGH (ref 70–99)
Glucose-Capillary: 173 mg/dL — ABNORMAL HIGH (ref 70–99)

## 2019-01-29 LAB — RENAL FUNCTION PANEL
Albumin: 2 g/dL — ABNORMAL LOW (ref 3.5–5.0)
Anion gap: 12 (ref 5–15)
BUN: 10 mg/dL (ref 8–23)
CO2: 30 mmol/L (ref 22–32)
Calcium: 8.2 mg/dL — ABNORMAL LOW (ref 8.9–10.3)
Chloride: 93 mmol/L — ABNORMAL LOW (ref 98–111)
Creatinine, Ser: 0.75 mg/dL (ref 0.61–1.24)
GFR calc Af Amer: >60 mL/min (ref >60)
GFR calc non Af Amer: >60 mL/min (ref >60)
Glucose, Bld: 263 mg/dL — ABNORMAL HIGH (ref 70–99)
Phosphorus: 4.1 mg/dL (ref 2.5–4.6)
Potassium: 3.5 mmol/L (ref 3.5–5.1)
Sodium: 135 mmol/L (ref 135–145)

## 2019-01-29 MED ORDER — LISINOPRIL 2.5 MG PO TABS
2.5000 mg | ORAL_TABLET | Freq: Every day | ORAL | Status: DC
  Administered 2019-01-29: 12:00:00 2.5 mg via ORAL
  Filled 2019-01-29: qty 1

## 2019-01-29 MED ORDER — SODIUM CHLORIDE 0.9% FLUSH
10.0000 mL | INTRAVENOUS | Status: DC | PRN
  Administered 2019-01-29 – 2019-01-30 (×3): 10 mL
  Filled 2019-01-29 (×3): qty 40

## 2019-01-29 MED ORDER — LISINOPRIL 10 MG PO TABS
10.0000 mg | ORAL_TABLET | Freq: Every day | ORAL | Status: DC
  Administered 2019-01-30: 11:00:00 10 mg via ORAL
  Filled 2019-01-29: qty 1

## 2019-01-29 MED ORDER — LISINOPRIL 5 MG PO TABS
7.5000 mg | ORAL_TABLET | Freq: Once | ORAL | Status: AC
  Administered 2019-01-29: 7.5 mg via ORAL
  Filled 2019-01-29: qty 1

## 2019-01-29 MED ORDER — INSULIN DETEMIR 100 UNIT/ML ~~LOC~~ SOLN
16.0000 [IU] | Freq: Every day | SUBCUTANEOUS | Status: DC
  Administered 2019-01-30 – 2019-02-07 (×9): 16 [IU] via SUBCUTANEOUS
  Filled 2019-01-29 (×9): qty 0.16

## 2019-01-29 NOTE — Progress Notes (Signed)
Inpatient Diabetes Program Recommendations  AACE/ADA: New Consensus Statement on Inpatient Glycemic Control (2015)  Target Ranges:  Prepandial:   less than 140 mg/dL      Peak postprandial:   less than 180 mg/dL (1-2 hours)      Critically ill patients:  140 - 180 mg/dL   Lab Results  Component Value Date   GLUCAP 251 (H) 01/29/2019   HGBA1C 11.7 (H) 01/07/2019    Review of Glycemic Control Results for Frederick Moody, Frederick Moody (MRN [id number]) as of 01/29/2019 10:22  Ref. Range 01/28/2019 16:56 01/28/2019 21:14 01/29/2019 08:00  Glucose-Capillary Latest Ref Range: 70 - 99 mg/dL 126 (H) 225 (H) 251 (H)   Diabetes history: DM 2 Outpatient Diabetes medications: Metformin- patient not taking Current orders for Inpatient glycemic control:  Novolog 0-5 units QHS, Novolog 0-15 units TID, Levemir 14 units QD  Inpatient Diabetes Program Recommendations:   FSBG 251 mg/dL, consider increasing Lantus to 16 units QD.   Thanks, Bronson Curb, MSN, RNC-OB Diabetes Coordinator [phone] (8a-5p)

## 2019-01-29 NOTE — Progress Notes (Signed)
Spoke with Dr. Tarri Abernethy regarding patients increased BP. Awaiting orders.

## 2019-01-29 NOTE — Progress Notes (Signed)
Inpatient Rehabilitation Admissions Coordinator  Pt currently not at a level to be able to tolerate the intensity of an inpt rehab admission. I have encouraged him to participate as much as able. He is asking for other pain meds besides ibuprofen. I will follow.,  Danne Baxter, RN, MSN Rehab Admissions Coordinator [phone] 01/29/2019 1:09 PM

## 2019-01-29 NOTE — Progress Notes (Addendum)
Subjective: Patient was seen at bedside this morning and is doing well. He was able to walk yesterday after 60 cc of fluid was removed from his right leg. Denies feeling the same "tightening" pain from yesterday. PICC was inserted today. Discussed his elevated BP and that we may need to start a medication. He was amiable to this suggestion. All questions and concerns were addressed.   Objective:  Vital signs in last 24 hours: Vitals:   01/28/19 1730 01/28/19 1800 01/28/19 2112 01/29/19 0408  BP: (!) 150/78 (!) 148/75 (!) 146/88 (!) 141/86  Pulse: 98 98 (!) 103 (!) 101  Resp: 18 19 18 19   Temp: 98.3 F (36.8 C) 98.2 F (36.8 C) 98.7 F (37.1 C) 98.4 F (36.9 C)  TempSrc: Oral Oral Oral Oral  SpO2: 99% 99% 99% 98%  Weight:      Height:       Physical Exam Vitals signs and nursing note reviewed.  Constitutional:      General: He is not in acute distress.    Appearance: Normal appearance. He is not ill-appearing, toxic-appearing or diaphoretic.  HENT:     Head: Normocephalic and atraumatic.  Cardiovascular:     Rate and Rhythm: Normal rate and regular rhythm.     Pulses: Normal pulses.     Heart sounds: No murmur. No gallop.   Pulmonary:     Effort: Pulmonary effort is normal. No respiratory distress.     Breath sounds: Normal breath sounds. No stridor. No wheezing or rhonchi.  Abdominal:     General: Abdomen is flat. Bowel sounds are normal. There is no distension.     Palpations: Abdomen is soft.     Tenderness: There is no abdominal tenderness.  Musculoskeletal:        General: Swelling present.     Comments: Right lower extremity skin is less tense to palpation, with no pain elicited on palpation.   Skin:    General: Skin is warm.     Comments: Dressings in place on the cervical spine are intact and show no signs of purulence or leakage.   Neurological:     Mental Status: He is alert.     Assessment/Plan:  Principal Problem:   MSSA bacteremia Active Problems:    DKA (diabetic ketoacidoses) (HCC)   Alcohol withdrawal (HCC)   Aspiration pneumonia (HCC)   Sepsis ([place])   Acute encephalopathy   Acute respiratory failure (HCC)   Malnutrition of moderate degree   Hypokalemia   Pressure injury of skin   Palliative care encounter   Critical illness myopathy  MSSA Bacteremia 2/2 Soft Tissue Abscess Myositis and LUE Cellulitis: - Afibrile - Continue Ancef through 02/24/2019 -Wound dressing currently clean with no signs of pus or drainage.   Right Lower Extremity Edema - U/S of Rt lower extremity shows heterogenous area in the posterior proximal calf measuring 4.9cm.  - CT of R lower extremity shows large fluid collection lateral head of the right gastrocnemius muscle which could be subacute hematoma, abscess, or less likely necrotic mass. - Orthopaedics consulted and reached out to IR for drainage. We appreciate their recommendations.  - IR drained 60cc of murky fluid and sent for culture. - Cultures pending.  Acute Hypoxic Respiratory Failure Steno PNA -Maintain SaO2>88%  Encephalopathy/EtOH Withdrawal  - Continue Zyprexa   DM  - CBG goal: <180 - Glucose-Capillary: 251 01/29/2019 - Continue sliding scale insulin - Started 16 units of Levemir  Dysphagia:  - Work with therapist -  Thin liquid diet  SVT:  - Continue to monitor  - No LE VTE per duplex 01/23/2019  Anemia of Critical Illness - HgB is 8.7   - Continue to monitor  Hypokalemia - K: 3.5  Hypomagnesia - Mg: 1.7 (01/28/2019)  Hypertension:   - 141/86 with systolic range of 140s-160s. - Started Lisinopril 2.5 mg QD  - Furosemide 40 mg IV BID Dispo: Anticipated discharge pending medical course.   Dolan AmenWinters, Chace Klippel, MD 01/29/2019, 6:52 AM Pager: [phone]

## 2019-01-29 NOTE — Plan of Care (Signed)
  Problem: Coping: Goal: Level of anxiety will decrease Outcome: Progressing   Problem: Pain Managment: Goal: General experience of comfort will improve Outcome: Progressing   

## 2019-01-30 LAB — RENAL FUNCTION PANEL
Albumin: 2.1 g/dL — ABNORMAL LOW (ref 3.5–5.0)
Anion gap: 12 (ref 5–15)
BUN: 14 mg/dL (ref 8–23)
CO2: 28 mmol/L (ref 22–32)
Calcium: 8.2 mg/dL — ABNORMAL LOW (ref 8.9–10.3)
Chloride: 94 mmol/L — ABNORMAL LOW (ref 98–111)
Creatinine, Ser: 0.98 mg/dL (ref 0.61–1.24)
GFR calc Af Amer: >60 mL/min (ref >60)
GFR calc non Af Amer: >60 mL/min (ref >60)
Glucose, Bld: 194 mg/dL — ABNORMAL HIGH (ref 70–99)
Phosphorus: 4 mg/dL (ref 2.5–4.6)
Potassium: 3.7 mmol/L (ref 3.5–5.1)
Sodium: 134 mmol/L — ABNORMAL LOW (ref 135–145)

## 2019-01-30 LAB — GLUCOSE, CAPILLARY
Glucose-Capillary: 210 mg/dL — ABNORMAL HIGH (ref 70–99)
Glucose-Capillary: 181 mg/dL — ABNORMAL HIGH (ref 70–99)
Glucose-Capillary: 104 mg/dL — ABNORMAL HIGH (ref 70–99)
Glucose-Capillary: 136 mg/dL — ABNORMAL HIGH (ref 70–99)

## 2019-01-30 MED ORDER — KETOROLAC TROMETHAMINE 30 MG/ML IJ SOLN
30.0000 mg | Freq: Once | INTRAMUSCULAR | Status: AC
  Administered 2019-01-30: 30 mg via INTRAVENOUS
  Filled 2019-01-30: qty 1

## 2019-01-30 MED ORDER — ACETAMINOPHEN 325 MG PO TABS
650.0000 mg | ORAL_TABLET | Freq: Once | ORAL | Status: DC
  Filled 2019-01-30: qty 2

## 2019-01-30 MED ORDER — LIVING WELL WITH DIABETES BOOK
Freq: Once | Status: AC
  Administered 2019-01-30: 17:00:00
  Filled 2019-01-30 (×2): qty 1

## 2019-01-30 MED ORDER — LISINOPRIL 20 MG PO TABS
20.0000 mg | ORAL_TABLET | Freq: Every day | ORAL | Status: DC
  Administered 2019-01-31 – 2019-02-01 (×2): 20 mg via ORAL
  Filled 2019-01-30 (×2): qty 1

## 2019-01-30 MED ORDER — JUVEN PO PACK
1.0000 | PACK | Freq: Two times a day (BID) | ORAL | Status: DC
  Administered 2019-01-30 – 2019-02-09 (×17): 1 via ORAL
  Filled 2019-01-30 (×22): qty 1

## 2019-01-30 MED ORDER — INSULIN STARTER KIT- PEN NEEDLES (ENGLISH)
1.0000 | Freq: Once | Status: AC
  Administered 2019-01-30: 15:00:00 1
  Filled 2019-01-30: qty 1

## 2019-01-30 NOTE — Progress Notes (Signed)
Subjective: Frederick Moody was seen at bedside today. He was tired, and did not get much sleep last night. He did not ambulate yesterday. He continues to have some pain but is improved compared to prior. Discussed the plan to call orthopedic surgery to discuss further intervention. He voices understanding. No other complaints this morning. All questions and concerns addressed.   Objective: Vital signs in last 24 hours: Vitals:   01/29/19 1500 01/29/19 1756 01/29/19 2024 01/30/19 0537  BP: (!) 168/105  (!) 149/98 (!) 155/98  Pulse:   (!) 108 (!) 111  Resp:  20 16 20   Temp:   98.5 F (36.9 C) 98.5 F (36.9 C)  TempSrc:   Oral Oral  SpO2:   94% 90%  Weight:      Height:      Physical Exam Vitals signs and nursing note reviewed.  Constitutional:      General: He is not in acute distress.    Appearance: Normal appearance. He is not ill-appearing, toxic-appearing or diaphoretic.  HENT:     Head: Normocephalic and atraumatic.  Cardiovascular:     Rate and Rhythm: Normal rate and regular rhythm.     Pulses: Normal pulses.     Heart sounds: Normal heart sounds. No murmur. No friction rub.  Pulmonary:     Effort: Pulmonary effort is normal. No respiratory distress.     Breath sounds: Normal breath sounds. No stridor. No wheezing, rhonchi or rales.  Abdominal:     General: Abdomen is flat. Bowel sounds are normal. There is no distension.     Palpations: Abdomen is soft.  Musculoskeletal:        General: Swelling and tenderness present.  Neurological:     Mental Status: He is alert.     Assessment/Plan:  Principal Problem:   MSSA bacteremia Active Problems:   DKA (diabetic ketoacidoses) (HCC)   Alcohol withdrawal (HCC)   Aspiration pneumonia (HCC)   Sepsis (HCC)   Acute encephalopathy   Acute respiratory failure (HCC)   Malnutrition of moderate degree   Hypokalemia   Pressure injury of skin   Palliative care encounter   Critical illness myopathy  MSSA Bacteremia 2/2  Soft Tissue Abscess Myositis and LUE Cellulitis: - Afibrile - Continue Ancef through 02/24/2019 -Wound dressing currently clean with nosigns of pus or drainage.  Right Lower Extremity Edema - U/S of Rt lower extremity shows heterogenous area in the posterior proximal calf measuring 4.9cm.  - CT of Rlowerextremityshows large fluid collection lateral head of the right gastrocnemius muscle which could be subacute hematoma, abscess, or less likely necrotic mass. - Orthopaedics consulted and reached out to IR for drainage. We appreciate their recommendations.  - IR drained 60cc of murky fluid and sent for culture. - Cultures show no growth. - Will consult orthopaedics for possible I and D.  Acute Hypoxic Respiratory Failure Steno PNA -Maintain SaO2>88%  Encephalopathy/EtOH Withdrawal  - Continue Zyprexa   DM  - CBG goal: <180 - Glucose-Capillary: 251 01/29/2019 - Continue sliding scale insulin - Started 16 units of Levemir  Dysphagia:  - Work with therapist - Thin liquid diet  SVT:  - Continue to monitor  - No LE VTE per duplex 01/23/2019  Anemia of Critical Illness - HgB is 8.7   - Continue to monitor  Hypokalemia - K: 3.7  Hypomagnesia - Mg: 1.7 (01/28/2019)  Hypertension:   - 141/86 with systolic range of 140s-160s. - Started Lisinopril 20 mg QD  - Furosemide 40 mg  IV BID  Dispo: Anticipated discharge pending medical course.    [name], MD 01/30/2019, 6:28 AM Pager: [phone]

## 2019-01-30 NOTE — TOC Initial Note (Addendum)
Transition of Care [facility](TOC) - Initial/Assessment Note    Patient Details  Name: Frederick SizerRobert Moody MRN: [id number] Date of Birth: 1955/09/21  Transition of Care [facility](TOC) CM/SW Contact:    Kingsley PlanWile, Laci Frenkel Marie, RN Phone Number: 01/30/2019, 4:38 PM  Clinical Narrative:                  Patient from home with brother. Patient states brother Bernette RedbirdKenny can provide 24 hour assistance. Patient aware his brother and him will need to be taught how to adminster IV ABX   Patient does not have PCP ,per  DR Mikey BussingHoffman [facility] . Per notes ortho will come see patient. Patient may need I and D .  At discharge will need OPAT prescription and orders for HHPT and RN .  Pam with [facility] following. [facility] Vikki PortsValerie [facility] and [facility] left message.   Patient has walker at home but will need 3 in 1 .   Will continue to follow.  Expected Discharge Plan: Home w Home Health Services Barriers to Discharge: Continued Medical Work up   Patient Goals and CMS Choice Patient states their goals for this hospitalization and ongoing recovery are:: to go home CMS Medicare.gov Compare Post Acute Care list provided to:: Patient Choice offered to / list presented to : Patient  Expected Discharge Plan and Services Expected Discharge Plan: Home w Home Health Services In-house Referral: Financial Counselor Discharge Planning Services: CM Consult Post Acute Care Choice: Home Health Living arrangements for the past 2 months: Single Family Home                                      Prior Living Arrangements/Services Living arrangements for the past 2 months: Single Family Home Lives with:: Siblings Patient language and need for interpreter reviewed:: Yes Do you feel safe going back to the place where you live?: Yes      Need for Family Participation in Patient Care: Yes (Comment) Care giver support system in place?: Yes (comment) Current home services: DME Criminal  Activity/Legal Involvement Pertinent to Current Situation/Hospitalization: No - Comment as needed  Activities of Daily Living Home Assistive Devices/Equipment: None ADL Screening (condition at time of admission) Patient's cognitive ability adequate to safely complete daily activities?: Yes Is the patient deaf or have difficulty hearing?: No Does the patient have difficulty seeing, even when wearing glasses/contacts?: No Does the patient have difficulty concentrating, remembering, or making decisions?: No Patient able to express need for assistance with ADLs?: Yes Does the patient have difficulty dressing or bathing?: No Independently performs ADLs?: Yes (appropriate for developmental age) Does the patient have difficulty walking or climbing stairs?: No Weakness of Legs: None Weakness of Arms/Hands: None  Permission Sought/Granted   Permission granted to share information with : Yes, Verbal Permission Granted  Share Information with NAME: Bernette RedbirdKenny     Permission granted to share info w Relationship: brother     Emotional Assessment Appearance:: Appears stated age     Orientation: : Oriented to Self, Oriented to Place, Oriented to  Time, Oriented to Situation Alcohol / Substance Use: Not Applicable Psych Involvement: No (comment)  Admission diagnosis:  Diabetic ketoacidosis without coma associated with type 2 diabetes mellitus (HCC) [E11.10] Sepsis, due to unspecified organism, unspecified whether acute organ dysfunction present [facility](HCC) [A41.9] Patient Active Problem List   Diagnosis Date Noted  . Critical illness  myopathy 01/25/2019  . Palliative care encounter   . Pressure injury of skin 01/17/2019  . Hypokalemia   . Malnutrition of moderate degree 01/12/2019  . Acute encephalopathy   . Acute respiratory failure ([place])   . Aspiration pneumonia ([place]) 01/10/2019  . Sepsis ([place])   . MSSA bacteremia 01/08/2019  . Alcohol withdrawal ([place]) 01/08/2019  . DKA (diabetic ketoacidoses)  ([place]) 01/07/2019   PCP:  Patient, No Pcp Per Pharmacy:   [place] ([place]), [place] - [address] [place] ([place]) [place] [address] Phone: [phone] Fax: [phone]     Social Determinants of Health (SDOH) Interventions    Readmission Risk Interventions No flowsheet data found.

## 2019-01-30 NOTE — Progress Notes (Signed)
Inpatient Rehabilitation Admissions Coordinator  Patient not a candidate for an inpt rehab admission. We will sign off at this time. We will sign off at this time. Nira Conn, RN CM made aware.  Danne Baxter, RN, MSN Rehab Admissions Coordinator [phone] 01/30/2019 1:21 PM

## 2019-01-30 NOTE — Progress Notes (Signed)
Pt's BP at this time is 170/99, HR 115. Pt's pain at 8/10 to Right lower leg. Text paged MD on call.  New orders received. Kept right leg elevated. Pain meds given. Will recheck later.

## 2019-01-30 NOTE — Progress Notes (Addendum)
Paged MD regarding patient's BP 171/103. Awaiting return call.

## 2019-01-30 NOTE — Progress Notes (Signed)
Physical Therapy Treatment Patient Details Name: Frederick SizerRobert Cristina MRN: [id number] DOB: 1955-09-28 Today's Date: 01/30/2019    History of Present Illness Pt is a 63 y.o. male admitted 01/07/19 with weakness and falls. Worked up for DKA, sepsis with MSSA bacteremia and abscess on back; s/p I&D 7/21. Hospital course complicated by AMS with ETOH withdrawl, possible benzo OD and aspiration; ETT 7/19-7/24, reintubated 7/27-7/29. Pt with continued RLE edema; CT showed large fluid collection; s/p aspiration of R calf on 8/5 with 60cc of fluid removed. PMH includes DM, ETOH abuse.   PT Comments    Pt remains agitated by RLE pain, limited participation with therapies due to this. Able to ambulate into bathroom with RW and min guard, requiring assist to stand. Pt with gastroc/heel cord tightness, educ on stretching technique but pt declining due to pain. Due to pt's limited tolerance, do not feel CIR-level therapies appropriate, and patient agrees. Pt reports he plans to go home with assist from family. Recommend follow-up with HHPT services.    Follow Up Recommendations  Home health PT;Supervision for mobility/OOB     Equipment Recommendations  Rolling walker with 5" wheels;3in1 (PT)    Recommendations for Other Services       Precautions / Restrictions Precautions Precautions: Fall Precaution Comments: painful RLE Restrictions Weight Bearing Restrictions: No    Mobility  Bed Mobility Overal bed mobility: Modified Independent Bed Mobility: Supine to Sit;Sit to Supine           General bed mobility comments: HOB slightly elevated  Transfers Overall transfer level: Needs assistance Equipment used: Rolling walker (2 wheeled) Transfers: Sit to/from Stand Sit to Stand: Min assist         General transfer comment: MinA to stand from EOB and toilet to RW; pt reliant on UE support to push into standing. Decreased RLE WB  Ambulation/Gait Ambulation/Gait assistance: Min guard Gait  Distance (Feet): 20 Feet     Gait velocity: Decreased   General Gait Details: Amb to/from bathroom with slow, antalgic steps using RW, close min guard for balance. Pt with painful RLE walking on toe, adamant against attempting to stretch or try to touch heel to ground due to pain despite education/encouragement   Stairs             Wheelchair Mobility    Modified Rankin (Stroke Patients Only)       Balance Overall balance assessment: Needs assistance   Sitting balance-Leahy Scale: Fair       Standing balance-Leahy Scale: Poor Standing balance comment: Reliant on UE support to offload painful RLE                            Cognition Arousal/Alertness: Awake/alert Behavior During Therapy: Flat affect Overall Cognitive Status: No family/caregiver present to determine baseline cognitive functioning Area of Impairment: Attention;Following commands;Memory;Safety/judgement;Awareness                   Current Attention Level: Selective Memory: Decreased short-term memory Following Commands: Follows one step commands with increased time Safety/Judgement: Decreased awareness of safety;Decreased awareness of deficits Awareness: Emergent   General Comments: Pt's cognition seems to be exacerbated by his distraction regarding pain, focuses on talking about his pain meds and not wanting to move entire session despite attempts to redirect conversation. Difficult to reason with      Exercises      General Comments General comments (skin integrity, edema, etc.): Discussed importance of ROM and elevation/swelling  reduction; pt declining both. Hyperfocused on pain and needing the "right" pain medicine if he is going to do anything. Pt reports whatever was most recently given to him has worked the best (per Therapist, sports, pt recently received Advil)      Pertinent Vitals/Pain Pain Assessment: Faces Faces Pain Scale: Hurts little more Pain Location: RLE with WB Pain  Descriptors / Indicators: Discomfort;Shooting;Sharp Pain Intervention(s): Limited activity within patient's tolerance;Premedicated before session    Home Living                      Prior Function            PT Goals (current goals can now be found in the care plan section) Acute Rehab PT Goals Patient Stated Goal: Get rid of RLE pain PT Goal Formulation: With patient Time For Goal Achievement: 01/31/19 Potential to Achieve Goals: Fair Progress towards PT goals: Progressing toward goals    Frequency    Min 3X/week      PT Plan Discharge plan needs to be updated    Co-evaluation              AM-PAC PT "6 Clicks" Mobility   Outcome Measure  Help needed turning from your back to your side while in a flat bed without using bedrails?: None Help needed moving from lying on your back to sitting on the side of a flat bed without using bedrails?: None Help needed moving to and from a bed to a chair (including a wheelchair)?: A Little Help needed standing up from a chair using your arms (e.g., wheelchair or bedside chair)?: A Little Help needed to walk in hospital room?: A Little Help needed climbing 3-5 steps with a railing? : A Lot 6 Click Score: 19    End of Session Equipment Utilized During Treatment: Gait belt Activity Tolerance: Patient limited by pain Patient left: in bed;with call bell/phone within reach;with bed alarm set Nurse Communication: Mobility status PT Visit Diagnosis: Other abnormalities of gait and mobility (R26.89);Muscle weakness (generalized) (M62.81);Difficulty in walking, not elsewhere classified (R26.2)     Time: 8921-1941 PT Time Calculation (min) (ACUTE ONLY): 11 min  Charges:  $Gait Training: 8-22 mins                    Mabeline Caras, PT, DPT Acute Rehabilitation Services  Pager [phone] Office [place] 01/30/2019, 12:59 PM

## 2019-01-30 NOTE — Progress Notes (Signed)
Nutrition Follow-up  DOCUMENTATION CODES:   Non-severe (moderate) malnutrition in context of chronic illness  INTERVENTION:   -Continue Magic cup TID with meals, each supplement provides 290 kcal and 9 grams of protein -Continue MVI with minerals daily -Continue Hormel Shake TID with meals, each supplement provides 520 kcals and 15 grams protein -Continue 30 ml Prostat TID, each supplement provides 100 kcals and 15 grams protein -1 packet Juven BID, each packet provides 95 calories, 2.5 grams of protein (collagen), and 9.8 grams of carbohydrate (3 grams sugar); also contains 7 grams of L-arginine and L-glutamine, 300 mg vitamin C, 15 mg vitamin E, 1.2 mcg vitamin B-12, 9.5 mg zinc, 200 mg calcium, and 1.5 g  Calcium Beta-hydroxy-Beta-methylbutyrate to support wound healing -Continue with liberalized diet of regular to help promote adequate PO intake   NUTRITION DIAGNOSIS:   Moderate Malnutrition related to chronic illness(poorly controlled DM, EtOH abuse, abscess) as evidenced by percent weight loss, mild muscle depletion.  Ongoing  GOAL:   Patient will meet greater than or equal to 90% of their needs  Progressing   MONITOR:   PO intake, Supplement acceptance, Labs, Weight trends, Skin, I & O's  REASON FOR ASSESSMENT:   Ventilator    ASSESSMENT:   63 yo male with hx of uncontrolled DM and EtOH abuse presents with weakness and falls. Pt admitted with DKA, sepsis with MSSA bacteremia and abscess on back, AMS with EtOH withdrawal and possible benzo OD and aspiration requiring intubation.  7/15 Admit 7/19 Intubated 7/21 I&D posterior neck/upper back abscess; 15 cm x 10 cm x 5 cm 7/24 Extubated 7/27 Re-Intubated 7/29 Extubated, TF continued via NG tube 7/31 NG tube removed, MBS 8/2 s/p BSE- advanced to regular diet with thin liquids 8/5 s/p Procedure: US guided aspirate of right calf fluid for sample. ~60cc murky fluid  Attempted to speak to pt via phone, however, unable  to initiate conversation due to poor connection (pt continued to repeat "hello" during conversation and then did not respond to requestions).   Pt's intake has improved since last visit. Noted meal completion 40-75%.   Per DM coordinator note, pt does not like supplements provided. Reviewed MAR, pt is refusing Ensure supplements, however, taking Prostat.   Given pt's malnutrition and increased nutrient needs for wound healing and current illness, recommend continued liberalized diet of regular. RD can provide further guidance and individualized diet education closer to discharge.   Medications reviewed and include vitamin b-1 and folvite.   Per palliative care notes, pt does not desire a trach or feeding tube.   Labs reviewed: Na: 134, CBGS: 173-210 (inpatient orders for glycemic control are 0-15 units insulin aspart TID with meals, 0-5 units insulin aspart q HS, and 16 units insulin detemir daily).   Diet Order:   Diet Order            Diet regular Room service appropriate? Yes; Fluid consistency: Thin  Diet effective now              EDUCATION NEEDS:   Not appropriate for education at this time  Skin:  Skin Assessment: Skin Integrity Issues: Skin Integrity Issues:: Stage II Stage II: sacrum Other: neck and back abscess  Last BM:  01/29/19  Height:   Ht Readings from Last 1 Encounters:  01/27/19 5\' 9"  (1.753 m)    Weight:   Wt Readings from Last 1 Encounters:  01/28/19 83 kg    Ideal Body Weight:  72.7 kg  BMI:  Body mass  index is 27.02 kg/m.  Estimated Nutritional Needs:   Kcal:  2300-2500  Protein:  130-145 grams  Fluid:  > 2.3 L    Azuri Bozard A. Mayford KnifeWilliams, RD, LDN, CDCES Registered Dietitian II Certified Diabetes Care and Education Specialist Pager: [phone] After hours Pager: [phone]

## 2019-01-30 NOTE — Progress Notes (Signed)
Inpatient Diabetes Program Recommendations  AACE/ADA: New Consensus Statement on Inpatient Glycemic Control (2015)  Target Ranges:  Prepandial:   less than 140 mg/dL      Peak postprandial:   less than 180 mg/dL (1-2 hours)      Critically ill patients:  140 - 180 mg/dL   Lab Results  Component Value Date   GLUCAP 181 (H) 01/30/2019   HGBA1C 11.7 (H) 01/07/2019    Review of Glycemic Control Results for Frederick Moody, Frederick Moody (MRN [id number]) as of 01/30/2019 13:27  Ref. Range 01/29/2019 20:51 01/30/2019 08:01 01/30/2019 11:56  Glucose-Capillary Latest Ref Range: 70 - 99 mg/dL 173 (H) 210 (H) 181 (H)   Diabetes history: Type 2 DM Outpatient Diabetes medications: none Current orders for Inpatient glycemic control: Levemir 16 units QD, Novolog 0-15 units TID, Novolog 0-5 units QHS  Inpatient Diabetes Program Recommendations:    Spoke with patient regarding DM management and DKA on admission. Patient has previously taken Metformin, but had not taken in over 6 months.  Reviewed patient's current A1c of 11.7%. Explained what a A1c is and what it measures. Also reviewed goal A1c with patient, importance of good glucose control @ home, and blood sugar goals. Reviewed patho of DM, need for insulin, role of pancreas, increased risk for infection with poor glycemic control, impact on glucose with ETOH, signs and symptoms of hypo vs hyper glycemia, interventions associated, vascular changes and commorbidities. Patient will need a meter at discharge. Blood glucose meter (includes lancets and strips) ([id number]). Encouraged to begin learning how to check blood sugars while inpatient and begin thinking about fitting this into a routine at home to consist of atleast 2-3 times per day.  Patient admits to thirst and frequent urination and would consume 2 gallons of milk within 2 days. Reviewed at length the importance of being mindful with carb intake, learning foods/beverages that are higher in carbs, and finding  alternatives. Will place dietitan consult for additional reinforcement.   Began teaching insulin pen, however, patient not accepting of further topics at this time. Became tearful at situation. Provided encouragement and listened to patient's concerns. Will attempt again closer to discharge.   Anticipate patient will need reinforcement on concepts. Will order [facility] for patient to review and place care order for RNs to help with CBGs and self injections.  Thanks, Bronson Curb, MSN, RNC-OB Diabetes Coordinator [phone] (8a-5p)

## 2019-01-30 NOTE — Progress Notes (Signed)
Patient's sister called concerned stating the patient mentioned something about hospice. She has requested a call from the MD for an update. She is stating the patient cannot go home as his brother who he lives with is handicap. She states he cannot go home if he is not able to walk.  Paged MD to relay the message.

## 2019-01-30 NOTE — Discharge Instructions (Signed)
Glucose Products:  ReliOn glucose products raise low blood sugar fast. Tablets are free of fat, caffeine, sodium and gluten. They are portable and easy to carry, making it easier for people with diabetes to BE PREPARED for lows.  Glucose Tablets Available in 6 flavors  10 ct...................................... $1.00  50 ct...................................... $3.98 Glucose Shot..................................$1.48 Glucose Gel....................................$3.44  Alcohol Swabs Alcohol swabs are used to sterilize your injection site. All of our swabs are individually wrapped for maximum safety, convenience and moisture retention. ReliOn Alcohol Swabs  100 ct Swabs..............................$1.00  400 ct Swabs..............................$3.74  Lancets ReliOn offers three lancet options conveniently designed to work with almost every lancing device. Each features a protective disk, which guarantees sterility before testing. ReliOn Lancets  100 ct Lancets $1.56  200 ct Lancets $2.64 Available in Ultra-Thin, Thin & Micro-Thin ReliOn 2-IN-1 Lancing Device  50 ct Lancets..................................... $3.44 Available in 30 gauge and 25 gauge ReliOn Lancing Device....................$5.84  Blood Glucose Monitors ReliOn offers a full range of blood glucose testing options to provide an accurate, affordable system that meets each person's unique needs and preferences. Prime Meter....................................... $9.00 Prime Test Strips  25 test strips.................................... $5.00  50 test strips.................................... $9.00  100 test strips.................................$17.88 Premier [employer] Meter    $18.98 Premier Voice Meter  .  $14.98 Premier Test Strips  50 test strips.................................... $9.00  100 test strips.................................$17.88 Premier [employer] Kit     $19.44 Kit includes:  50 test strips  10 lancets  Lancing device  Carry case  Ketone Test Strips  50 test strips  ..  $6.64  Human Insulin  Novolin/ReliOn (recombinant DNA origin) is manufactured for [employer] by [employer] Insulin* with Vial..........$24.88 Available in N, R, 70/30 Novolin/ReliOn Insulin Pens*    $42.88 Available in N, R, and 70/30  Insulin Delivery ReliOn syringes and pen needles provide precision technology, comfort and accuracy in insulin delivery at affordable prices. ReliOn Pen Needles*  50 ct....................................................$9.00 Available in 32m, 640m 61m22m 72m41mliOn Insulin Syringes*  100 ct . $12.58 Available in 29G, 30G & 31G (3/10cc, 1/2cc & 1cc units)  Insulin Aspart injection What is this medicine? INSULIN ASPART (IN su lin AS part) is a human-made form of insulin. This drug lowers the amount of sugar in your blood. It is a fast acting insulin that starts working faster than regular insulin. It will not work as long as regular insulin. This medicine may be used for other purposes; ask your health care provider or pharmacist if you have questions. COMMON BRAND NAME(S): Fiasp, Fias[employer]MedtronicvoLog, NovoLog Flexpen, NovoLog PenFill What should I tell my health care provider before I take this medicine? They need to know if you have any of these conditions:  episodes of low blood sugar  eye disease, vision problems  kidney disease  liver disease  an unusual or allergic reaction to insulin, metacresol, other medicines, foods, dyes, or preservatives  pregnant or trying to get pregnant  breast-feeding How should I use this medicine? This medicine is for injection under the skin. Use exactly as directed. It is important to follow the directions given to you by your health care professional or doctor. If you are using Novolog, you  should start your meal within 5 to 10 minutes after injection. If you are using Fiasp, you should start your meal at the time of injection or within 20 minutes after injection. Have food ready before injection. Do not delay eating. You will be taught how to use this medicine  and how to adjust doses for activities and illness. Do not use more insulin than prescribed. Do not use more or less often than prescribed. Always check the appearance of your insulin before using it. This medicine should be clear and colorless like water. Do not use if it is cloudy, thickened, colored, or has solid particles in it. If you use a pen, be sure to take off the outer needle cover before using the dose. It is important that you put your used needles and syringes in a special sharps container. Do not put them in a trash can. If you do not have a sharps container, call your pharmacist or healthcare provider to get one. Talk to your pediatrician regarding the use of this medicine in children. While this drug may be prescribed for children as young as 2 years for selected conditions, precautions do apply. Overdosage: If you think you have taken too much of this medicine contact a poison control center or emergency room at once. NOTE: This medicine is only for you. Do not share this medicine with others. What if I miss a dose? It is important not to miss a dose. Your health care professional or doctor should discuss a plan for missed doses with you. If you do miss a dose, follow their plan. Do not take double doses. What may interact with this medicine?  other medicines for diabetes Many medications may cause an increase or decrease in blood sugar, these include:  alcohol containing beverages  antiviral medicines for HIV or AIDS  aspirin and aspirin-like drugs  certain medicines for depression, anxiety, or psychotic disturbances  chromium  diuretics  male hormones, like estrogens or progestins and birth control  pills  heart medicines  isoniazid  MAOIs like Carbex, Eldepryl, Marplan, Nardil, and Parnate  male hormones or anabolic steroids  medicines for weight loss  medicines for allergies, asthma, cold, or cough  niacin  NSAIDs, medicines for pain and inflammation, like ibuprofen or naproxen  octreotide  pentamidine  phenytoin  probenecid  quinolone antibiotics like ciprofloxacin, levofloxacin, ofloxacin  some herbal dietary supplements  steroid medicines like prednisone or cortisone  sulfamethoxazole; trimethoprim  thyroid medicine Some medications can hide the warning symptoms of low blood sugar. You may need to monitor your blood sugar more closely if you are taking one of these medications. These include:  beta-blockers such as atenolol, metoprolol, propranolol  clonidine  guanethidine  reserpine This list may not describe all possible interactions. Give your health care provider a list of all the medicines, herbs, non-prescription drugs, or dietary supplements you use. Also tell them if you smoke, drink alcohol, or use illegal drugs. Some items may interact with your medicine. What should I watch for while using this medicine? Visit your health care professional or doctor for regular checks on your progress. A test called the HbA1C (A1C) will be monitored. This is a simple blood test. It measures your blood sugar control over the last 2 to 3 months. You will receive this test every 3 to 6 months. Learn how to check your blood sugar. Learn the symptoms of low and high blood sugar and how to manage them. Always carry a quick-source of sugar with you in case you have symptoms of low blood sugar. Examples include hard sugar candy or glucose tablets. Make sure others know that you can choke if you eat or drink when you develop serious symptoms of low blood sugar, such as seizures or unconsciousness. They must get medical help  at once. Tell your doctor or health care  professional if you have high blood sugar. You might need to change the dose of your medicine. If you are sick or exercising more than usual, you might need to change the dose of your medicine. Do not skip meals. Ask your doctor or health care professional if you should avoid alcohol. Many nonprescription cough and cold products contain sugar or alcohol. These can affect blood sugar. Make sure that you have the right kind of syringe for the type of insulin you use. Try not to change the brand and type of insulin or syringe unless your health care professional or doctor tells you to. Switching insulin brand or type can cause dangerously high or low blood sugar. Always keep an extra supply of insulin, syringes, and needles on hand. Use a syringe one time only. Throw away syringe and needle in a closed container to prevent accidental needle sticks. Insulin pens and cartridges should never be shared. Even if the needle is changed, sharing may result in passing of viruses like hepatitis or HIV. Each time you get a new box of pen needles, check to see if they are the same type as the ones you were trained to use. If not, ask your health care professional to show you how to use this new type properly. Wear a medical ID bracelet or chain, and carry a card that describes your disease and details of your medicine and dosage times. What side effects may I notice from receiving this medicine? Side effects that you should report to your doctor or health care professional as soon as possible:  allergic reactions like skin rash, itching or hives, swelling of the face, lips, or tongue  breathing problems  signs and symptoms of high blood sugar such as dizziness, dry mouth, dry skin, fruity breath, nausea, stomach pain, increased hunger or thirst, increased urination  signs and symptoms of low blood sugar such as feeling anxious, confusion, dizziness, increased hunger, unusually weak or tired, sweating, shakiness,  cold, irritable, headache, blurred vision, fast heartbeat, loss of consciousness Side effects that usually do not require medical attention (report to your doctor or health care professional if they continue or are bothersome):  increase or decrease in fatty tissue under the skin due to overuse of a particular injection site  itching, burning, swelling, or rash at site where injected This list may not describe all possible side effects. Call your doctor for medical advice about side effects. You may report side effects to FDA at 1-800-FDA-1088. Where should I keep my medicine? Keep out of the reach of children. Unopened Vials: Novolog Vials: Store in a refrigerator between 2 and 8 degrees C (36 and 46 degrees F) or at room temperature below 30 degrees C (86 degrees F). Do not freeze or use if the insulin has been frozen. Protect from light and excessive heat. If stored at room temperature, the vial must be discarded after 28 days. Throw away any unopened and unused medicine that has been stored in the refrigerator after the expiration date. Fiasp Vials: Store in a refrigerator between 2 and 8 degrees C (36 and 46 degrees F) or at room temperature below 30 degrees C (86 degrees F). Do not freeze or use if the insulin has been frozen. Protect from light and excessive heat. If stored at room temperature, the vial must be discarded after 28 days. Throw away any unopened and unused medicine that has been stored in the refrigerator after the  expiration date. Unopened Pens and Cartridges: Novolog Flexpens and cartridges: Store in a refrigerator between 2 and 8 degrees C (36 and 46 degrees F) or at room temperature below 30 degrees C (86 degrees F). Do not freeze or use if the insulin has been frozen. Protect from light and excessive heat. If stored at room temperature, the pen or cartridge must be discarded after 28 days. Throw away any unopened and unused medicine that has been stored in the refrigerator  after the expiration date. Fiasp FlexTouch pens: Store in a refrigerator between 2 and 8 degrees C (36 and 46 degrees F) or at room temperature below 30 degrees C (86 degrees F). Do not freeze or use if the insulin has been frozen. Protect from light and excessive heat. If stored at room temperature, the pen must be discarded after 28 days. Throw away any unopened and unused medicine that has been stored in the refrigerator after the expiration date. Fiasp FlexTouch cartridges: Store at room temperature below 30 degrees C (86 degrees F). Do not refrigerate or freeze. Keep away from heat and light. Throw the cartridge away after 28 days, even if it still has insulin left in it. Vials that you are using: Novolog Vials: Store in the refrigerator or at room temperature below 30 degrees C (86 degrees F). Do not freeze. Keep away from heat and light. Throw the opened vial away after 28 days. Fiasp Vials: Store in the refrigerator or at room temperature below 30 degrees C (86 degrees F). Do not freeze. Keep away from heat and light. Throw the opened vial away after 28 days. Pens and cartridges that you are using: Novolog Flexpens and cartridges: Store at room temperature below 30 degrees C (86 degrees F). Do not refrigerate or freeze. Keep away from heat and light. Throw away the pen or cartridge after 28 days, even if it still has insulin left in it. Fiasp FlexTouch pens: Store in the refrigerator or at room temperature below 30 degrees C (86 degrees F). Do not freeze. Keep away from heat and light. Throw the pen away after 28 days, even if it still has insulin left in it. Fiasp FlexTouch cartridges: Store at room temperature below 30 degrees C (86 degrees F). Do not refrigerate or freeze. Keep away from heat and light. Throw the cartridge away after 28 days, even if it still has insulin left in it. NOTE: This sheet is a summary. It may not cover all possible information. If you have questions about this  medicine, talk to your doctor, pharmacist, or health care provider.  2020 Elsevier/Gold Standard (2018-09-19 11:35:39)  Hypoglycemia Hypoglycemia is when the sugar (glucose) level in your blood is too low. Signs of low blood sugar may include:  Feeling: ? Hungry. ? Worried or nervous (anxious). ? Sweaty and clammy. ? Confused. ? Dizzy. ? Sleepy. ? Sick to your stomach (nauseous).  Having: ? A fast heartbeat. ? A headache. ? A change in your vision. ? Tingling or no feeling (numbness) around your mouth, lips, or tongue. ? Jerky movements that you cannot control (seizure).  Having trouble with: ? Moving (coordination). ? Sleeping. ? Passing out (fainting). ? Getting upset easily (irritability). Low blood sugar can happen to people who have diabetes and people who do not have diabetes. Low blood sugar can happen quickly, and it can be an emergency. Treating low blood sugar Low blood sugar is often treated by eating or drinking something sugary right away, such as:  Fruit  juice, 4-6 oz (120-150 mL).  Regular soda (not diet soda), 4-6 oz (120-150 mL).  Low-fat milk, 4 oz (120 mL).  Several pieces of hard candy.  Sugar or honey, 1 Tbsp (15 mL). Treating low blood sugar if you have diabetes If you can think clearly and swallow safely, follow the 15:15 rule:  Take 15 grams of a fast-acting carb (carbohydrate). Talk with your doctor about how much you should take.  Always keep a source of fast-acting carb with you, such as: ? Sugar tablets (glucose pills). Take 3-4 pills. ? 6-8 pieces of hard candy. ? 4-6 oz (120-150 mL) of fruit juice. ? 4-6 oz (120-150 mL) of regular (not diet) soda. ? 1 Tbsp (15 mL) honey or sugar.  Check your blood sugar 15 minutes after you take the carb.  If your blood sugar is still at or below 70 mg/dL (3.9 mmol/L), take 15 grams of a carb again.  If your blood sugar does not go above 70 mg/dL (3.9 mmol/L) after 3 tries, get help right  away.  After your blood sugar goes back to normal, eat a meal or a snack within 1 hour.  Treating very low blood sugar If your blood sugar is at or below 54 mg/dL (3 mmol/L), you have very low blood sugar (severe hypoglycemia). This may also cause:  Passing out.  Jerky movements you cannot control (seizure).  Losing consciousness (coma). This is an emergency. Do not wait to see if the symptoms will go away. Get medical help right away. Call your local emergency services (911 in the [place] Do not drive yourself to the hospital. If you have very low blood sugar and you cannot eat or drink, you may need a glucagon shot (injection). A family member or friend should learn how to check your blood sugar and how to give you a glucagon shot. Ask your doctor if you need to have a glucagon shot kit at home. Follow these instructions at home: General instructions  Take over-the-counter and prescription medicines only as told by your doctor.  Stay aware of your blood sugar as told by your doctor.  Limit alcohol intake to no more than 1 drink a day for nonpregnant women and 2 drinks a day for men. One drink equals 12 oz of beer (355 mL), 5 oz of wine (148 mL), or 1 oz of hard liquor (44 mL).  Keep all follow-up visits as told by your doctor. This is important. If you have diabetes:   Follow your diabetes care plan as told by your doctor. Make sure you: ? Know the signs of low blood sugar. ? Take your medicines as told. ? Follow your exercise and meal plan. ? Eat on time. Do not skip meals. ? Check your blood sugar as often as told by your doctor. Always check it before and after exercise. ? Follow your sick day plan when you cannot eat or drink normally. Make this plan ahead of time with your doctor.  Share your diabetes care plan with: ? Your work or school. ? People you live with.  Check your pee (urine) for ketones: ? When you are sick. ? As told by your doctor.  Carry a card or wear  jewelry that says you have diabetes. Contact a doctor if:  You have trouble keeping your blood sugar in your target range.  You have low blood sugar often. Get help right away if:  You still have symptoms after you eat or drink something sugary.  Your blood sugar is at or below 54 mg/dL (3 mmol/L).  You have jerky movements that you cannot control.  You pass out. These symptoms may be an emergency. Do not wait to see if the symptoms will go away. Get medical help right away. Call your local emergency services (911 in the [place] Do not drive yourself to the hospital. Summary  Hypoglycemia happens when the level of sugar (glucose) in your blood is too low.  Low blood sugar can happen to people who have diabetes and people who do not have diabetes. Low blood sugar can happen quickly, and it can be an emergency.  Make sure you know the signs of low blood sugar and know how to treat it.  Always keep a source of sugar (fast-acting carb) with you to treat low blood sugar. This information is not intended to replace advice given to you by your health care provider. Make sure you discuss any questions you have with your health care provider. Document Released: 09/05/2009 Document Revised: 10/02/2018 Document Reviewed: 07/15/2015 Elsevier Patient Education  [place].  Hyperglycemia Hyperglycemia occurs when the level of sugar (glucose) in the blood is too high. Glucose is a type of sugar that provides the body's main source of energy. Certain hormones (insulin and glucagon) control the level of glucose in the blood. Insulin lowers blood glucose, and glucagon increases blood glucose. Hyperglycemia can result from having too little insulin in the bloodstream, or from the body not responding normally to insulin. Hyperglycemia occurs most often in people who have diabetes (diabetes mellitus), but it can happen in people who do not have diabetes. It can develop quickly, and it can be  life-threatening if it causes you to become severely dehydrated (diabetic ketoacidosis or hyperglycemic hyperosmolar state). Severe hyperglycemia is a medical emergency. What are the causes? If you have diabetes, hyperglycemia may be caused by:  Diabetes medicine.  Medicines that increase blood glucose or affect your diabetes control.  Not eating enough, or not eating often enough.  Changes in physical activity level.  Being sick or having an infection. If you have prediabetes or undiagnosed diabetes:  Hyperglycemia may be caused by those conditions. If you do not have diabetes, hyperglycemia may be caused by:  Certain medicines, including steroid medicines, beta-blockers, epinephrine, and thiazide diuretics.  Stress.  Serious illness.  Surgery.  Diseases of the pancreas.  Infection. What increases the risk? Hyperglycemia is more likely to develop in people who have risk factors for diabetes, such as:  Having a family member with diabetes.  Having a gene for type 1 diabetes that is passed from parent to child (inherited).  Living in an area with cold weather conditions.  Exposure to certain viruses.  Certain conditions in which the body's disease-fighting (immune) system attacks itself (autoimmune disorders).  Being overweight or obese.  Having an inactive (sedentary) lifestyle.  Having been diagnosed with insulin resistance.  Having a history of prediabetes, gestational diabetes, or polycystic ovarian syndrome (PCOS).  Being of American-Indian, African-American, Hispanic/Latino, or Asian/Pacific Islander descent. What are the signs or symptoms? Hyperglycemia may not cause any symptoms. If you do have symptoms, they may include early warning signs, such as:  Increased thirst.  Hunger.  Feeling very tired.  Needing to urinate more often than usual.  Blurry vision. Other symptoms may develop if hyperglycemia gets worse, such as:  Dry mouth.  Loss of  appetite.  Fruity-smelling breath.  Weakness.  Unexpected or rapid weight gain or weight loss.  Tingling or numbness in the hands or feet.  Headache.  Skin that does not quickly return to normal after being lightly pinched and released (poor skin turgor).  Abdominal pain.  Cuts or bruises that are slow to heal. How is this diagnosed? Hyperglycemia is diagnosed with a blood test to measure your blood glucose level. This blood test is usually done while you are having symptoms. Your health care provider may also do a physical exam and review your medical history. You may have more tests to determine the cause of your hyperglycemia, such as:  A fasting blood glucose (FBG) test. You will not be allowed to eat (you will fast) for at least 8 hours before a blood sample is taken.  An A1c (hemoglobin A1c) blood test. This provides information about blood glucose control over the previous 2-3 months.  An oral glucose tolerance test (OGTT). This measures your blood glucose at two times: ? After fasting. This is your baseline blood glucose level. ? Two hours after drinking a beverage that contains glucose. How is this treated? Treatment depends on the cause of your hyperglycemia. Treatment may include:  Taking medicine to regulate your blood glucose levels. If you take insulin or other diabetes medicines, your medicine or dosage may be adjusted.  Lifestyle changes, such as exercising more, eating healthier foods, or losing weight.  Treating an illness or infection, if this caused your hyperglycemia.  Checking your blood glucose more often.  Stopping or reducing steroid medicines, if these caused your hyperglycemia. If your hyperglycemia becomes severe and it results in hyperglycemic hyperosmolar state, you must be hospitalized and given IV fluids. Follow these instructions at home:  General instructions  Take over-the-counter and prescription medicines only as told by your health  care provider.  Do not use any products that contain nicotine or tobacco, such as cigarettes and e-cigarettes. If you need help quitting, ask your health care provider.  Limit alcohol intake to no more than 1 drink per day for nonpregnant women and 2 drinks per day for men. One drink equals 12 oz of beer, 5 oz of wine, or 1 oz of hard liquor.  Learn to manage stress. If you need help with this, ask your health care provider.  Keep all follow-up visits as told by your health care provider. This is important. Eating and drinking   Maintain a healthy weight.  Exercise regularly, as directed by your health care provider.  Stay hydrated, especially when you exercise, get sick, or spend time in hot temperatures.  Eat healthy foods, such as: ? Lean proteins. ? Complex carbohydrates. ? Fresh fruits and vegetables. ? Low-fat dairy products. ? Healthy fats.  Drink enough fluid to keep your urine clear or pale yellow. If you have diabetes:  Make sure you know the symptoms of hyperglycemia.  Follow your diabetes management plan, as told by your health care provider. Make sure you: ? Take your insulin and medicines as directed. ? Follow your exercise plan. ? Follow your meal plan. Eat on time, and do not skip meals. ? Check your blood glucose as often as directed. Make sure to check your blood glucose before and after exercise. If you exercise longer or in a different way than usual, check your blood glucose more often. ? Follow your sick day plan whenever you cannot eat or drink normally. Make this plan in advance with your health care provider.  Share your diabetes management plan with people in your workplace, school, and household.  Check  your urine for ketones when you are ill and as told by your health care provider.  Carry a medical alert card or wear medical alert jewelry. Contact a health care provider if:  Your blood glucose is at or above 240 mg/dL (13.3 mmol/L) for 2 days  in a row.  You have problems keeping your blood glucose in your target range.  You have frequent episodes of hyperglycemia. Get help right away if:  You have difficulty breathing.  You have a change in how you think, feel, or act (mental status).  You have nausea or vomiting that does not go away. These symptoms may represent a serious problem that is an emergency. Do not wait to see if the symptoms will go away. Get medical help right away. Call your local emergency services (911 in the [place] Do not drive yourself to the hospital. Summary  Hyperglycemia occurs when the level of sugar (glucose) in the blood is too high.  Hyperglycemia is diagnosed with a blood test to measure your blood glucose level. This blood test is usually done while you are having symptoms. Your health care provider may also do a physical exam and review your medical history.  If you have diabetes, follow your diabetes management plan as told by your health care provider.  Contact your health care provider if you have problems keeping your blood glucose in your target range. This information is not intended to replace advice given to you by your health care provider. Make sure you discuss any questions you have with your health care provider. Document Released: 12/05/2000 Document Revised: 02/27/2016 Document Reviewed: 02/27/2016 Elsevier Patient Education  2020 [employer].

## 2019-01-30 NOTE — Progress Notes (Signed)
Rechecked pt's Vitals, BP at 162/91, HR 116. PRN sleeping med given. Will monitor pt.

## 2019-01-31 DIAGNOSIS — L0291 Cutaneous abscess, unspecified: Secondary | ICD-10-CM

## 2019-01-31 LAB — RENAL FUNCTION PANEL
Albumin: 2.1 g/dL — ABNORMAL LOW (ref 3.5–5.0)
Anion gap: 13 (ref 5–15)
BUN: 18 mg/dL (ref 8–23)
CO2: 28 mmol/L (ref 22–32)
Calcium: 8.1 mg/dL — ABNORMAL LOW (ref 8.9–10.3)
Chloride: 95 mmol/L — ABNORMAL LOW (ref 98–111)
Creatinine, Ser: 0.89 mg/dL (ref 0.61–1.24)
GFR calc Af Amer: >60 mL/min (ref >60)
GFR calc non Af Amer: >60 mL/min (ref >60)
Glucose, Bld: 154 mg/dL — ABNORMAL HIGH (ref 70–99)
Phosphorus: 4 mg/dL (ref 2.5–4.6)
Potassium: 3.7 mmol/L (ref 3.5–5.1)
Sodium: 136 mmol/L (ref 135–145)

## 2019-01-31 LAB — GLUCOSE, CAPILLARY
Glucose-Capillary: 187 mg/dL — ABNORMAL HIGH (ref 70–99)
Glucose-Capillary: 178 mg/dL — ABNORMAL HIGH (ref 70–99)
Glucose-Capillary: 283 mg/dL — ABNORMAL HIGH (ref 70–99)
Glucose-Capillary: 89 mg/dL (ref 70–99)

## 2019-01-31 MED ORDER — HYDROCODONE-ACETAMINOPHEN 5-325 MG PO TABS
1.0000 | ORAL_TABLET | Freq: Four times a day (QID) | ORAL | Status: DC | PRN
  Administered 2019-01-31 – 2019-02-01 (×2): 1 via ORAL
  Filled 2019-01-31 (×2): qty 1

## 2019-01-31 MED ORDER — GLUCERNA 1.2 CAL PO LIQD
1000.0000 mL | ORAL | Status: DC
  Filled 2019-01-31: qty 1000

## 2019-01-31 MED ORDER — SENNOSIDES-DOCUSATE SODIUM 8.6-50 MG PO TABS
1.0000 | ORAL_TABLET | Freq: Every day | ORAL | Status: DC
  Administered 2019-01-31 – 2019-02-05 (×6): 1 via ORAL
  Filled 2019-01-31 (×9): qty 1

## 2019-01-31 MED ORDER — FUROSEMIDE 10 MG/ML IJ SOLN: 40.0000 mg | Freq: Every day | INTRAMUSCULAR | Status: DC

## 2019-01-31 NOTE — Progress Notes (Signed)
Daily Progress Note   Patient Name: Frederick Moody       Date: 01/31/2019 DOB: 05-26-1956  Age: 63 y.o. MRN#: [id number] Attending Physician: Lucious Groves, DO Primary Care Physician: Patient, No Pcp Per Admit Date: 01/07/2019  Reason for Consultation/Follow-up: Establishing goals of care and Psychosocial/spiritual support  Subjective: Visited patient at bedside.  We talked about his leg abscess developing despite being on strong antibiotics.  Frederick Moody complains that he has to stay in bed and is not allowed out of bed.  He feels this is making him weaker.  He tells me he falls a lot.  I read PT note that patient is hampered from participation in PT due to RLE pain.  I understand that patient has a hx of addiction to alcohol and likely benzodiazepines, but I feel low dose opioid medications are necessary short term to get him up and moving.   Discussed this with patient.   He is concerned and does not want to become addicted.  He states, "I've heard you can become addicted in 5 days and I don't want that".  We discussed eating / good nutrition is important for healing.  He complains that hospital food is not good so he has not been eating much.     Assessment: Patient stable.  On 6 weeks of antibiotic therapy for deep back abscess.  Now with RLE abscess.     Patient Profile/HPI:  63 y.o. male, Frederick Moody,  with past medical history of DM, alcoholism and benzodiazepine dependence who was admitted on 01/07/2019 with DKA and bacteremia from an abscess on his back.  His hospitalization has been complicated by alcohol and benzodiazepine withdraw.  He has required intubation 2x.  He is now extubated.  He requires IV antibiotics thru 9/1.  He is a DNR.       Length of Stay: 24  Current Medications:  Scheduled Meds:  . acetaminophen  650 mg Oral Once  . chlorhexidine  15 mL Mouth Rinse BID  . Chlorhexidine Gluconate Cloth  6 each Topical Q0600  . enoxaparin (LOVENOX) injection  40 mg Subcutaneous Q24H  . famotidine  20 mg Oral Daily  . feeding supplement (PRO-STAT SUGAR FREE 64)  30 mL Oral TID BM  . folic acid  1 mg Oral Daily  . [START ON 02/01/2019] furosemide  40 mg Intravenous Daily  . insulin aspart  0-15 Units Subcutaneous TID WC  . insulin aspart  0-5 Units Subcutaneous QHS  . insulin detemir  16 Units Subcutaneous Daily  . lisinopril  20 mg Oral Daily  . multivitamin with minerals  1 tablet Oral Daily  . nutrition supplement (JUVEN)  1 packet Oral BID BM  . OLANZapine  5 mg Oral QHS  . potassium chloride  20 mEq Oral BID  . thiamine  100 mg Oral Daily    Continuous Infusions: .  ceFAZolin (ANCEF) IV 2 g (01/31/19 0506)    PRN Meds: HYDROcodone-acetaminophen, ibuprofen, ipratropium-albuterol, lip balm, LORazepam, Melatonin, polyvinyl alcohol, Resource ThickenUp Clear, sodium chloride flush  Physical Exam       Well developed pale man, awake, alert, cooperative Tachy resp no distress RLE with area of darkened, enduration on calf.  Vital Signs: BP (!) 159/93 (BP Location: Left Arm)   Pulse (!) 111   Temp 97.7 F (36.5 C) (Oral)   Resp 18   Ht 5\' 9"  (1.753 m)   Wt 83.2 kg   SpO2 98%   BMI 27.09 kg/m  SpO2: SpO2: 98 % O2 Device: O2 Device: Room Air O2 Flow Rate: O2 Flow Rate (L/min): 2 L/min  Intake/output summary:   Intake/Output Summary (Last 24 hours) at 01/31/2019 1314 Last data filed at 01/31/2019 1109 Gross per 24 hour  Intake 1620 ml  Output 1175 ml  Net 445 ml   LBM: Last BM Date: 01/30/19 Baseline Weight: Weight: 79.4 kg Most recent weight: Weight: 83.2 kg       Palliative Assessment/Data: 50%      Patient Active Problem List   Diagnosis Date Noted  . 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 Palliative care encounter   . Pressure  injury of skin 01/17/2019  . Hypokalemia   . Malnutrition of moderate degree 01/12/2019  . Acute encephalopathy   . Acute respiratory failure (HCC)   . Aspiration pneumonia (HCC) 01/10/2019  . Sepsis (HCC)   . MSSA bacteremia 01/08/2019  . Alcohol withdrawal (HCC) 01/08/2019  . DKA (diabetic ketoacidoses) (HCC) 01/07/2019    Palliative Care Plan    Recommendations/Plan:  Recommend a short course of PRN vicodin given 1 hour before out of bed to chair or ambulation.  Glucerna   Senna S  PMT will follow up in a few days.  Goals of Care and Additional Recommendations:  Limitations on Scope of Treatment: Full Scope Treatment  Code Status:  DNR  Prognosis:   Unable to determine   Discharge Planning:  Home with Home Health  Care plan was discussed with patient.  Thank you for allowing the Palliative Medicine Team to assist in the care of this patient.  Total time spent:  35 min.     Greater than 50%  of this time was spent counseling and coordinating care related to the above assessment and plan.  Norvel RichardsMarianne Salvator Seppala, PA-C Palliative Medicine  Please contact Palliative MedicineTeam phone at [phone] for questions and concerns between 7 am - 7 pm.   Please see AMION for individual provider pager numbers.

## 2019-01-31 NOTE — Progress Notes (Signed)
Ortho Trauma Note  Examined the patient and reviewed the imaging at the request of internal medicine service. 63 yo with MSSA bacteremia with right lower extremity lateral gastroc collection drained by IR earlier this week. Concern about persistent abscess.  Patient states his leg doesn't hurt much unless he tries to walk on it. States the swelling is not much worse over the last few days.  Exam: Afebrile RLE: No erythema about leg. There is a definitive collection to lateral posterior calf but without significant tenderness. Pain with forced dorsiflexion. Otherwise neuro intact. The collection feels firm.  Imaging: CT with lateral gastroc collection  A/P 63 yo w/ MSSA bacteremia and right posterior lateral gastroc collection  Clinically the patient is exam is very unimpressive.  There is no erythema and very minimal pain to palpation.  The patient is having more discomfort due to the collection in his gastroc causing him limited dorsiflexion.  The patient remains afebrile.  He will be on IV antibiotics for another month I do not see any clinical indication for a surgery at this point.  Unless the patient became septic or showed more clinical signs.  Pain is not an indication for surgical intervention at this point.  The pain is likely related to the stretch of the lateral gastroc.  Patient was able to ambulate with the nurse while he was in the room.  I will reevaluate the patient tomorrow.  But currently no role for surgical intervention.  I did provide a compressive Ace wrap to the lower extremity as I think this would be of more clinical benefit than a surgical procedure.  Shona Needles, MD [facility] [phone] (phone) [phone] (office) [email or web address]

## 2019-01-31 NOTE — Progress Notes (Signed)
   Subjective: Patient doing well today. He has not gotten up to ambulate. His RLE continues to cause discomfort and he feels the swelling is worse. 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 His brother is completely dependent on him for his ADLs. He does seem a little confused today. Discussed the plan to call orthopedic surgery today and continue to work on strength. He voices understanding. All questions and concerns addressed.   Objective: Vital signs in last 24 hours: Vitals:   01/30/19 2040 01/30/19 2315 01/31/19 0357 01/31/19 0500  BP: (!) 170/99 (!) 162/91 (!) 141/74   Pulse: (!) 115 (!) 116 (!) 110 (!) 108  Resp: 18  18   Temp: 98.4 F (36.9 C)  98.1 F (36.7 C)   TempSrc: Oral  Oral   SpO2: 94%  93%   Weight:    83.2 kg  Height:       General: Well nourished male in no acute distress Pulm: Good air movement with no wheezing or crackles  CV: RRR, no murmurs, no rubs  Extremities: Posterior aspect of the RLE is erythematous, swollen, and tender to palpate. There is a fluctuating mass. Seems worse compared to prior.   Assessment/Plan:  MSSA Bacteremia 2/2 Soft Tissue Abscess Myositis and LUE Cellulitis: - Afibrile - Continue Ancef through 02/24/2019 -Wound dressing currently clean with nosigns of pus or drainage.  Right Lower Extremity Edema - U/S of Rt lower extremity shows heterogenous area in the posterior proximal calf measuring 4.9cm.  - CT of Rlowerextremityshows large fluid collection lateral head of the right gastrocnemius muscle which could be subacute hematoma, abscess, or less likely necrotic mass. - Wound cultures continue to show NGTD but he has been on Abx therapy for several weeks and therefore will likely not grow out.  - Will consult orthopaedics for possible I&D.  Acute Hypoxic Respiratory Failure Steno PNA -Maintain SaO2>88%  Encephalopathy/EtOH Withdrawal  - Continue Zyprexa   DM  - CBG goal: <180 - Continue sliding scale insulin  - Started 16 units of Levemir  Dysphagia:  - Work with therapist - Thin liquid diet  SVT:  - Continue to monitor  - No LE VTE per duplex 01/23/2019  Anemia of Critical Illness - Continue to monitor  Hypertension:   - 053/97 with systolic range of 673A-193X. - Started Lisinopril 20 mg QD  - Decrease furosemide to once daily  Dispo: Anticipated discharge pending medical course.   Ina Homes, MD 01/31/2019, 8:59 AM

## 2019-02-01 DIAGNOSIS — L02415 Cutaneous abscess of right lower limb: Secondary | ICD-10-CM

## 2019-02-01 LAB — RENAL FUNCTION PANEL
Albumin: 2.2 g/dL — ABNORMAL LOW (ref 3.5–5.0)
Anion gap: 11 (ref 5–15)
BUN: 18 mg/dL (ref 8–23)
CO2: 27 mmol/L (ref 22–32)
Calcium: 7.9 mg/dL — ABNORMAL LOW (ref 8.9–10.3)
Chloride: 96 mmol/L — ABNORMAL LOW (ref 98–111)
Creatinine, Ser: 0.77 mg/dL (ref 0.61–1.24)
GFR calc Af Amer: >60 mL/min (ref >60)
GFR calc non Af Amer: >60 mL/min (ref >60)
Glucose, Bld: 183 mg/dL — ABNORMAL HIGH (ref 70–99)
Phosphorus: 3.7 mg/dL (ref 2.5–4.6)
Potassium: 3.9 mmol/L (ref 3.5–5.1)
Sodium: 134 mmol/L — ABNORMAL LOW (ref 135–145)

## 2019-02-01 LAB — GLUCOSE, CAPILLARY
Glucose-Capillary: 168 mg/dL — ABNORMAL HIGH (ref 70–99)
Glucose-Capillary: 139 mg/dL — ABNORMAL HIGH (ref 70–99)
Glucose-Capillary: 171 mg/dL — ABNORMAL HIGH (ref 70–99)
Glucose-Capillary: 103 mg/dL — ABNORMAL HIGH (ref 70–99)

## 2019-02-01 MED ORDER — LISINOPRIL 40 MG PO TABS
40.0000 mg | ORAL_TABLET | Freq: Every day | ORAL | Status: DC
  Administered 2019-02-02 – 2019-02-09 (×8): 40 mg via ORAL
  Filled 2019-02-01 (×8): qty 1

## 2019-02-01 MED ORDER — HYDROCODONE-ACETAMINOPHEN 5-325 MG PO TABS
0.5000 | ORAL_TABLET | Freq: Four times a day (QID) | ORAL | Status: DC | PRN
  Administered 2019-02-01 – 2019-02-07 (×6): 0.5 via ORAL
  Filled 2019-02-01 (×8): qty 1

## 2019-02-01 NOTE — Progress Notes (Addendum)
   Subjective: Patient doing well this AM. Ambulated the halls yesterday. RLE pain is improved with the ACE wrap. He is tired of being in the hospital. Discussed that his wound cultures are growing Staph Aureus, susceptibilities pending. We will continue to coordinate with orthopedic surgery. He would like to decrease his pain medication as it made him very tired and confused. All questions and concerns addressed.   Objective: Vital signs in last 24 hours: Vitals:   01/31/19 0500 01/31/19 1301 01/31/19 2034 02/01/19 0417  BP:  (!) 159/93 (!) 146/91 (!) 157/95  Pulse: (!) 108 (!) 111 (!) 117 (!) 110  Resp:  18 20 17   Temp:  97.7 F (36.5 C) 98.9 F (37.2 C) 98.2 F (36.8 C)  TempSrc:  Oral Oral Oral  SpO2:  98% 94% 95%  Weight: 83.2 kg     Height:       General: Well nourished male in no acute distress Pulm: Good air movement with no wheezing or crackles  CV: RRR, no murmurs, no rubs   Assessment/Plan:  MSSA Bacteremia 2/2 Soft Tissue Abscess Myositis and LUE Cellulitis: - Afibrile - Continue Ancef through 02/24/2019 -Wound dressing currently clean with nosigns of pus or drainage.  Right Lower Extremity Edema - Wound cultures growing Staph Aureus, susceptibilities pending  - Appreciate ortho evaluation and recs.  DM  - CBG goal: <180 - Continue sliding scale insulin - Levemir 16 units  SVT:  - Continue to monitor  - No LE VTE per duplex 01/23/2019  Anemia of Critical Illness - Continue to monitor  Hypertension:   - Increase Lisinopril to 40 mg QD  - Discontinue Furosemide  Acute Hypoxic Respiratory Failure Steno PNA. Resolved Encephalopathy/EtOH Withdrawal. Resolved Dysphagia. Resolved  Dispo: Anticipated discharge pending medical course.   Ina Homes, MD 02/01/2019, 10:23 AM

## 2019-02-01 NOTE — Progress Notes (Signed)
Pt refused blood draw and routine line care.  Pt was asked if he preferred to have blood works drawn later---- pt remained emphatic on his refusal; pt stated , "No, you're not going to draw anything".

## 2019-02-01 NOTE — Progress Notes (Signed)
Ortho Progress Note  Patient remains afebrile, no leukocytosis, clinically not worsening. He is going to be on IV antibiotics for another 4 weeks. I do not feel a surgery is clinically warranted unless the patient has worsening exam or becomes febrile again. Happy to discuss further as needed.  Shona Needles, MD [facility] [phone] (phone) [phone] (office) [email or web address]

## 2019-02-02 LAB — AEROBIC/ANAEROBIC CULTURE W GRAM STAIN (SURGICAL/DEEP WOUND)

## 2019-02-02 LAB — RENAL FUNCTION PANEL
Albumin: 2.2 g/dL — ABNORMAL LOW (ref 3.5–5.0)
Anion gap: 10 (ref 5–15)
BUN: 18 mg/dL (ref 8–23)
CO2: 27 mmol/L (ref 22–32)
Calcium: 7.8 mg/dL — ABNORMAL LOW (ref 8.9–10.3)
Chloride: 101 mmol/L (ref 98–111)
Creatinine, Ser: 0.75 mg/dL (ref 0.61–1.24)
GFR calc Af Amer: >60 mL/min (ref >60)
GFR calc non Af Amer: >60 mL/min (ref >60)
Glucose, Bld: 122 mg/dL — ABNORMAL HIGH (ref 70–99)
Phosphorus: 3.6 mg/dL (ref 2.5–4.6)
Potassium: 3.9 mmol/L (ref 3.5–5.1)
Sodium: 138 mmol/L (ref 135–145)

## 2019-02-02 LAB — GLUCOSE, CAPILLARY
Glucose-Capillary: 147 mg/dL — ABNORMAL HIGH (ref 70–99)
Glucose-Capillary: 185 mg/dL — ABNORMAL HIGH (ref 70–99)
Glucose-Capillary: 177 mg/dL — ABNORMAL HIGH (ref 70–99)
Glucose-Capillary: 137 mg/dL — ABNORMAL HIGH (ref 70–99)

## 2019-02-02 MED ORDER — CALCIUM CARBONATE ANTACID 500 MG PO CHEW
1.0000 | CHEWABLE_TABLET | Freq: Three times a day (TID) | ORAL | Status: DC | PRN
  Administered 2019-02-02 – 2019-02-06 (×2): 200 mg via ORAL
  Filled 2019-02-02 (×2): qty 1

## 2019-02-02 NOTE — Progress Notes (Signed)
Occupational Therapy Treatment Patient Details Name: Frederick Moody MRN: [id number] DOB: Nov 25, 1955 Today's Date: 02/02/2019    History of present illness Pt is a 63 y.o. male admitted 01/07/19 with weakness and falls. Worked up for DKA, sepsis with MSSA bacteremia and abscess on back; s/p I&D 7/21. Hospital course complicated by AMS with ETOH withdrawl, possible benzo OD and aspiration; ETT 7/19-7/24, reintubated 7/27-7/29. Pt with continued RLE edema; CT showed large fluid collection; s/p aspiration of R calf on 8/5 with 60cc of fluid removed. PMH includes DM, ETOH abuse.   OT comments  Pt presents sitting up in bed, pleasant and engaging with therapist but somewhat self limiting when encouraged to participate in OOB activity (reports partly due to Pt with frustrations regarding potential for additional procedure to RLE). Pt demonstrating sit<>stand from EOB with minA to RW; despite reports of improvements in RLE pain pt still maintaining NWB in RLE when standing. Pt able to perform bed mobility at mod independent level. Pt open to receiving theraband for UB strengthening and to practicing bathing ADL (he is hopeful for shower level) and to wash his hair - but declines participating during this session. Discharge recommendations have been updated as noted pt with CIR denial. Will continue to follow acutely to progress pt towards established OT goals.   Follow Up Recommendations  Home health OT;Supervision/Assistance - 24 hour    Equipment Recommendations  Tub/shower seat          Precautions / Restrictions Precautions Precautions: Fall Precaution Comments: painful RLE Restrictions Weight Bearing Restrictions: No       Mobility Bed Mobility Overal bed mobility: Modified Independent             General bed mobility comments: pt seated in long sitting with LEs crossed upon arrival; easily able to transition to sitting EOB and back to supine end of session   Transfers Overall  transfer level: Needs assistance Equipment used: Rolling walker (2 wheeled) Transfers: Sit to/from Stand Sit to Stand: Min assist         General transfer comment: boosting assist to rise from EOB and steady at RW; VCs for safe hand placement; pt maintaining NWB in RLE and reports continued difficulty with plantar flexion, only touching toes to floor at this time    Balance Overall balance assessment: Needs assistance Sitting-balance support: Feet supported;Bilateral upper extremity supported Sitting balance-Leahy Scale: Good     Standing balance support: Bilateral upper extremity supported Standing balance-Leahy Scale: Poor Standing balance comment: Reliant on UE support to offload painful RLE                           ADL either performed or assessed with clinical judgement   ADL Overall ADL's : Needs assistance/impaired                       Lower Body Dressing Details (indicate cue type and reason): pt seated in cross legged position in bed upon entering room - easily able to reach LEs for sock management; requires minA for static standing balance at RW             Functional mobility during ADLs: Minimal assistance;Rolling walker General ADL Comments: pt declined performing ADL this session, reports he has been up to bathroom x2 this AM and wishing to rest at this time; pt does report wishes to wash his hair and to shower, will communicate to RN/MD to see about  getting clearance for shower      Vision       Perception     Praxis      Cognition Arousal/Alertness: Awake/alert Behavior During Therapy: Flat affect Overall Cognitive Status: No family/caregiver present to determine baseline cognitive functioning Area of Impairment: Attention;Safety/judgement;Awareness                   Current Attention Level: Selective   Following Commands: Follows one step commands with increased time;Follows one step commands consistently    Awareness: Emergent   General Comments: pt's cognition appears to be improving compared to previous therapy session; pt with awareness of current situation and discussing with therapist MD's recommendation for possible return to OR, pt somewhat frustrated given current status and potential for needing another procedure done to his RLE         Exercises     Shoulder Instructions       General Comments      Pertinent Vitals/ Pain       Pain Assessment: Faces Faces Pain Scale: Hurts a little bit Pain Location: RLE Pain Descriptors / Indicators: Discomfort Pain Intervention(s): Monitored during session;Limited activity within patient's tolerance  Home Living                                          Prior Functioning/Environment              Frequency  Min 2X/week        Progress Toward Goals  OT Goals(current goals can now be found in the care plan section)  Progress towards OT goals: Progressing toward goals  Acute Rehab OT Goals Patient Stated Goal: shower OT Goal Formulation: With patient Time For Goal Achievement: 02/04/19 Potential to Achieve Goals: Good ADL Goals Pt Will Perform Grooming: with supervision;sitting Pt Will Perform Upper Body Bathing: with set-up;with supervision;sitting Pt Will Perform Upper Body Dressing: with set-up;sitting;with supervision Pt Will Perform Lower Body Dressing: with min assist;sit to/from stand Pt/caregiver will Perform Home Exercise Program: Increased strength;Both right and left upper extremity;With written HEP provided;Increased ROM;Independently Additional ADL Goal #1: Pt will maintain sitting balance EOB with supervision during ADL task.  Plan Discharge plan needs to be updated    Co-evaluation                 AM-PAC OT "6 Clicks" Daily Activity     Outcome Measure   Help from another person eating meals?: None Help from another person taking care of personal grooming?: A Little Help  from another person toileting, which includes using toliet, bedpan, or urinal?: Total Help from another person bathing (including washing, rinsing, drying)?: A Lot Help from another person to put on and taking off regular upper body clothing?: A Lot Help from another person to put on and taking off regular lower body clothing?: A Lot 6 Click Score: 14    End of Session Equipment Utilized During Treatment: Rolling walker  OT Visit Diagnosis: Unsteadiness on feet (R26.81);Muscle weakness (generalized) (M62.81)   Activity Tolerance Other (comment);Patient tolerated treatment well(pt self-limiting this session)   Patient Left in bed;with call bell/phone within reach;with bed alarm set   Nurse Communication Mobility status        Time: 1203-1221 OT Time Calculation (min): 18 min  Charges: OT General Charges $OT Visit: 1 Visit OT Treatments $Self Care/Home Management : 8-22 mins  Kaleen OdeaBreanna  Orvan Falconerampbell, OT [facility] Pager [phone] Office [phone]    Orlando PennerBreanna L Christoffer Currier 02/02/2019, 2:14 PM

## 2019-02-02 NOTE — TOC Progression Note (Signed)
Transition of Care [facility]) - Progression Note    Patient Details  Name: Frederick Moody MRN: [id number] Date of Birth: 1955-09-18  [facility] CM/SW Contact  Jacalyn Lefevre Edson Snowball, RN Phone Number: 02/02/2019, 4:57 PM  Clinical Narrative:     [facility] has accepted patient as HHRN.   Pam with [facility] will see patient this evening .   PT recommending SNF . Discussed with patient . Patient is refusing SNF. Patient states he is going " no where but home".   Patient states he has transportation to home.   Will see how he does with teaching with Pam with [facility].   Expected Discharge Plan: [place] Barriers to Discharge: Continued Medical Work up  Expected Discharge Plan and Services Expected Discharge Plan: [place] In-house Referral: Financial Counselor Discharge Planning Services: CM Consult Post Acute Care Choice: [place] arrangements for the past 2 months: Single Family Home                                       Social Determinants of Health (SDOH) Interventions    Readmission Risk Interventions No flowsheet data found.

## 2019-02-02 NOTE — TOC Progression Note (Signed)
Transition of Care [facility]) - Progression Note    Patient Details  Name: Frederick Moody MRN: [id number] Date of Birth: 1956/05/05  Transition of Care [facility]) CM/SW Contact  Jacalyn Lefevre Edson Snowball, RN Phone Number: 02/02/2019, 12:03 PM  Clinical Narrative:     Spoke with patient today. His plan is still to go home home with his brother Frederick Moody and be taught IV ABX.   Referral for infusion given to [facility] with [facility] who will met with patient prior to discharge.   Referral for home health nurse given to [facility] with [facility]. Referral would be for [facility] office which is short on nursing, [place] looking for another agency.   Patient has walker at home needs 3 in 1   Expected Discharge Plan: [place] Barriers to Discharge: Continued Medical Work up  Expected Discharge Plan and Services Expected Discharge Plan: [place] In-house Referral: Development worker, community Discharge Planning Services: CM Consult Post Acute Care Choice: [place] arrangements for the past 2 months: Single Family Home                                       Social Determinants of Health (SDOH) Interventions    Readmission Risk Interventions No flowsheet data found.

## 2019-02-02 NOTE — Progress Notes (Addendum)
   Subjective:  [name] was seen at bedside today. He states that he is feeling better because he has wrapped his right calf in ace wrap. He states that it has alleviated the pain, but he still has some pain when he tries to walk. All questions and concerns were addressed.  Objective:  Vital signs in last 24 hours: Vitals:   01/31/19 2034 02/01/19 0417 02/01/19 1115 02/01/19 2054  BP: (!) 146/91 (!) 157/95 (!) 157/103 (!) 160/87  Pulse: (!) 117 (!) 110 (!) 108 (!) 116  Resp: 20 17 20 18   Temp: 98.9 F (37.2 C) 98.2 F (36.8 C) 98.5 F (36.9 C) 97.6 F (36.4 C)  TempSrc: Oral Oral Oral Oral  SpO2: 94% 95% 96% 96%  Weight:      Height:       Physical Exam Constitutional:      General: He is not in acute distress.    Appearance: Normal appearance. He is not ill-appearing, toxic-appearing or diaphoretic.  Cardiovascular:     Rate and Rhythm: Regular rhythm. Tachycardia present.     Pulses: Normal pulses.     Heart sounds: Normal heart sounds. No murmur. No friction rub.  Pulmonary:     Effort: Pulmonary effort is normal. No respiratory distress.     Breath sounds: Normal breath sounds. No wheezing, rhonchi or rales.  Chest:     Chest wall: No tenderness.  Abdominal:     General: Abdomen is flat. Bowel sounds are normal. There is no distension.     Tenderness: There is no abdominal tenderness. There is no guarding.     Hernia: No hernia is present.  Musculoskeletal:     Comments: RLE calf is still tense to the touch, is not tender to palpation.   Skin:    General: Skin is warm.     Findings: No rash.  Neurological:     Mental Status: He is alert.     Assessment/Plan:  Principal Problem:   MSSA bacteremia Active Problems:   Malnutrition of moderate degree   Pressure injury of skin   Palliative care encounter   Critical illness myopathy   Abscess  MSSA Bacteremia 2/2 Soft Tissue Abscess Myositis and LUE Cellulitis: - Afibrile - Continue Ancef through  02/24/2019 -Wound dressing currently clean with nosigns of pus or drainage.  Right Lower Extremity Edema - Wound cultures growing Staph Aureus. - Susceptibilities:   - Resistant: Ciprofloxacin  - Susceptible: Clndamycin, Erythromcyin, Gentamicin, Inducible clindamycin, oxacillin, rifampin, tetracycline, trimeth/sulfa, and vancomycin.  - Appreciate ortho evaluation and recs.  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 Resolved Encephalopathy/EtOH Withdrawal. Resolved Dysphagia. Resolved  Dispo: Anticipated discharge pending medical course.   [name], MD 02/02/2019, 6:12 AM Pager: [phone]

## 2019-02-02 NOTE — Discharge Summary (Signed)
Name: Frederick Moody MRN: [id number] DOB: 01/23/1956 63 y.o. PCP: Patient, No Pcp Per  Date of Admission: 01/07/2019  4:41 PM Date of Discharge: 02/09/2019 Attending Physician: Lucious Groves, DO  Discharge Diagnosis: 1. DKA  2. EtOH withdrawal  3. MSSA Bacteremia  4. Myopathy of Critical Illness  5. Flash Pulmonary Edema / Hypertension  6. 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 Start taking on: February 10, 2019   carvedilol 3.125 MG tablet Commonly known as: COREG Take 1 tablet (3.125 mg total) by mouth 2 (two) times daily with a meal.   ceFAZolin  IVPB Commonly known as: ANCEF Inject 2 g into the vein every 8 (eight) hours for 20 days. Indication:  MSSA bacteremia Last Day of Therapy: 02/24/2019 Labs - Once weekly:  CBC/D and BMP, Labs - Every other week:  ESR and CRP   furosemide 40 MG tablet Commonly known as: LASIX Take 1 tablet (40 mg total) by mouth daily. Start taking on: February 10, 2019   Insulin Detemir 100 UNIT/ML Pen Commonly known as: LEVEMIR Inject 20 Units into the skin daily.   lisinopril 40 MG tablet Commonly known as: ZESTRIL Take 1 tablet (40 mg total) by mouth daily. Start taking on: February 10, 2019   metFORMIN 500 MG tablet Commonly known as: GLUCOPHAGE Take 1 tablet (500 mg total) by mouth 2 (two) times daily with a meal.   Pen Needles 32G X 4 MM Misc 1 Units by Does not apply route daily.            Home Infusion Instuctions  (From admission, onward)         Start     Ordered   02/09/19 0000  Home infusion instructions [facility] May follow [place] Dosing Protocol; May administer Cathflo as  needed to maintain patency of vascular access device.; Flushing of vascular access device: per [facility] Protocol: 0.9% NaCl pre/post medica...    Question Answer Comment  Instructions May follow [place] Dosing Protocol   Instructions May administer Cathflo as needed to maintain patency of vascular access device.   Instructions Flushing of vascular access device: per [facility] Protocol: 0.9% NaCl pre/post medication administration and prn patency; Heparin 100 u/ml, 15m for implanted ports and Heparin 10u/ml, 591mfor all other central venous catheters.   Instructions May follow [facility] Anaphylaxis Protocol for First Dose Administration in the home: 0.9% NaCl at 25-50 ml/hr to maintain IV access for protocol meds. Epinephrine 0.3 ml IV/IM PRN and Benadryl 25-50 IV/IM PRN s/s of anaphylaxis.   Instructions [facility] Infusion Coordinator (RN) to assist per patient IV care needs in the home PRN.      02/09/19 0940           Durable Medical Equipment  (From admission, onward)         Start     Ordered   Unscheduled  DME tub bench  Once     02/09/19 0940   Unscheduled  For home use only [facility]    Question:  Patient needs a walker to treat with the following condition  Answer:  Weakness   02/09/19 0940        Disposition and follow-up:   Frederick Moody was discharged from [facility] in Stable condition.  At the hospital follow up visit please address:  1.  MSSA Bacteremia. Please ensure th patient has home health resources to continue his IV Ancef and will follow-up with ID. DM. Up titrate the patient's insulin as needed. HTN. Patient was started on multiple medications for his HTN. Please check a BMP  2.  Labs / imaging needed at time of follow-up: BMP, CBC  3.  Pending labs/ test needing follow-up: None  Follow-up Appointments: Follow-up Information    Portal. Call.   Contact information: [place] [id number] [phone]       Donnie Mesa, MD. Call in 1 day(s).   Specialty: General Surgery Why: Call to schedule an appointment for follow up and wound check after you are discharged.  Contact information: [address] [place] [address] [phone]        Shona Needles, MD. Call in 7 day(s).   Specialty: Orthopedic Surgery Contact information: [place] [address] [place] Hospital Course by problem list:  1. DKA. Frederick Moody 63 year old male who presented on 7/16 after a fall. He was found to be hyperglycemic. Further workup was consistent with diabetic ketoacidosis. He was managed acutely with aggressive IV hydration and insulin drip. His course was complicated by alcohol withdrawal and subsequently resulted in intubation and transfer to the ICU (see below). Over the next 24 to 48 hours his DKA resolved and he was transition to SubQ insulin. Throughout his hospitalization he was treated with Lantus 18 units and sliding scale insulin. Upon discharge he was continued on Lantus 18 units and restarted on metformin. He will need close outpatient follow-up for further management.  2. EtOH withdrawal. During the first 48 hours of Frederick Moody hospitalization he began to experience alcohol withdrawals. He was treated with Ativan; however, this resulted in respiratory depression and he was subsequently intubated for airway protection. While intubated he required heavy sedation due to increased agitation. He was managed with Zyprexa QHS and clonidine. This was complicated by Stenotrophomonas maltophilia pneumonia. Which was treated with five days of Levaquin. The patient was subsequently weaned off his clonidine and continued on Zyprexa QHS. Zyprexa was discontinued on discharge.  3. MSSA Bacteremia. The patient's hospitalization was also complicated by MSSA bacteremia. Echocardiogram did not  illustrate any evidence of endocarditis; however, given his complicated course along with septic emboli complications it was elected that he would benefit from six weeks of IV Ancef. The patient did go for I&D of an upper back abscess on 7/21. He also developed his left lower extremity abscess that was drained by interventional radiology on 8/11. Orthopedic surgery was consulted and recommended observation. He will go home with home health help manage his infusions.  4. Myopathy of Critical Illness. Given the patient's critical illness, prolonged sedation on mechanical ventilation he experienced myopathy of critical illness. He worked with physical therapy and occupational therapy recommended admission to inpatient rehab; however, the patient experience intellectually handicapped brother and declined admission. He would prefer to go home with home health resources. This was arranged in coordination with case management.  5. Flash Pulmonary Edema / Hypertension. Patient was originally sent to be discharged on 8/10 however acutely decompensated. He  was found to be an acute hypoxic respiratory failure. Chest x-ray illustrated/pulmonary edema with bilateral effusions. He was managed with nitro drip and aggressive diuresis. Over the course of the next 2 to 3 days his respiratory status significantly improved and he was transition to oral medications. Thoracentesis was performed on 8/14 and 8/15. He was now able to maintain his oxygen saturation above 90% on room air. He was discharged on amlodipine five, Coreg 3.125 BID, furosemide 40 mg daily, and lisinopril 40 mg daily.  6. Bilateral superficial venous thrombosis. While the patient was in the ICU he received upper and lower extremity Doppler studies. His upper extremity Doppler studies illustrated bilateral superficial venous thrombosis. He was not anticoagulated with these. His swelling subsequently improved.  Discharge Vitals:   BP 126/71 (BP Location: Left  Arm)   Pulse 94   Temp 98.9 F (37.2 C) (Oral)   Resp (!) 25   Ht 5' 9" (1.753 m)   Wt 79.8 kg   SpO2 99%   BMI 25.99 kg/m   Pertinent Labs, Studies, and Procedures:   Echocardiogram 01/09/2019  1. The left ventricle has normal systolic function, with an ejection fraction of 55-60%. The cavity size was normal. Left ventricular diastolic Doppler parameters are consistent with pseudonormalization. Elevated mean left atrial pressure No evidence of  left ventricular regional wall motion abnormalities.  2. The right ventricle has normal systolic function. The cavity was normal. There is no increase in right ventricular wall thickness. Right ventricular systolic pressure is normal with an estimated pressure of 48.4 mmHg.  3. Left atrial size was mildly dilated.  4. There is mild mitral annular calcification present. The MR jet is centrally-directed.  5. Mild thickening of the aortic valve. Mild calcification of the aortic valve. No stenosis of the aortic valve.  6. The aortic root and ascending aorta are normal in size and structure.  Upper Extremity Dopplers 01/23/2019  Right: No evidence of deep vein thrombosis in the upper extremity. Findings consistent with acute superficial vein thrombosis involving the right cephalic vein in the forearm. Left: No evidence of deep vein thrombosis in the upper extremity. Findings consistent with acute superficial vein thrombosis involving the left cephalic vein above the antecubital fossa.  CT Right LE 01/27/2019  1. Large peripherally enhancing fluid collection within the lateral head of the right gastrocnemius muscle is nonspecific in etiology and may reflect a subacute hematoma, abscess, or less likely a necrotic mass. This should be amenable to percutaneous aspiration. 2. No other focal fluid collections. There is mild subcutaneous edema throughout the right lower extremity. 3. No evidence of osteomyelitis, acute osseous findings or significant  joint effusions. 4. Nonspecific peripherally calcified mass in the left scrotum. Correlate clinically. This could be evaluated with scrotal ultrasound if clinically warranted.  Discharge Instructions: Discharge Instructions    Call MD for:  difficulty breathing, headache or visual disturbances   Complete by: As directed    Call MD for:  temperature >100.4   Complete by: As directed    Diet - low sodium heart healthy   Complete by: As directed    Discharge instructions   Complete by: As directed    Thank you for allowing [place] to provide your care. We have started a lot of new medications. It will be important that you take them as directed and follow-up with your primary care provider. Home health will be coming out to help you with your antibiotics and help with strength training.   Home infusion  instructions [facility] May follow [place] Dosing Protocol; May administer Cathflo as needed to maintain patency of vascular access device.; Flushing of vascular access device: per [facility] Protocol: 0.9% NaCl pre/post medica...   Complete by: As directed    Instructions: May follow [place] Dosing Protocol   Instructions: May administer Cathflo as needed to maintain patency of vascular access device.   Instructions: Flushing of vascular access device: per [facility] Protocol: 0.9% NaCl pre/post medication administration and prn patency; Heparin 100 u/ml, 26m for implanted ports and Heparin 10u/ml, 572mfor all other central venous catheters.   Instructions: May follow [facility] Anaphylaxis Protocol for First Dose Administration in the home: 0.9% NaCl at 25-50 ml/hr to maintain IV access for protocol meds. Epinephrine 0.3 ml IV/IM PRN and Benadryl 25-50 IV/IM PRN s/s of anaphylaxis.   Instructions: [facility] Coordinator (RN) to assist per patient IV care needs in the home PRN.   Increase activity slowly   Complete by: As directed     Signed: HeIna HomesMD 02/09/2019, 9:41 AM

## 2019-02-02 NOTE — Progress Notes (Signed)
Physical Therapy Treatment Patient Details Name: Frederick SizerRobert Moody MRN: [id number] DOB: 03/16/1956 Today's Date: 02/02/2019    History of Present Illness Pt is a 63 y.o. male admitted 01/07/19 with weakness and falls. Worked up for DKA, sepsis with MSSA bacteremia and abscess on back; s/p I&D 7/21. Hospital course complicated by AMS with ETOH withdrawl, possible benzo OD and aspiration; ETT 7/19-7/24, reintubated 7/27-7/29. Pt with continued RLE edema; CT showed large fluid collection; s/p aspiration of R calf on 8/5 with 60cc of fluid removed. PMH includes DM, ETOH abuse.    PT Comments    Patient found sitting on BSC upon PT arrival with bed alarm going off and IV line stretched out, "I almost did not make it." This PT returned for a second time after pt was premedicated. Pt able to stand and take a few steps to get back to bed with Min guard assist but adamantly refusing to do any further walking or mobility. Mobility limited due to RLE pain and unwillingness despite being premedicated. Concerned about pt going home as he is a high fall risk and does not have proper support as his brother is handicapped and not able to assist as needed (per nurse). Also noted to have poor awareness of safety/deficits/current situation. When asked how he was going to go home without walking, "I will be fine, just need time." Discharge recommendation updated to SNF due to above. Will follow.   Follow Up Recommendations  Supervision for mobility/OOB;SNF     Equipment Recommendations  Rolling walker with 5" wheels;3in1 (PT)    Recommendations for Other Services       Precautions / Restrictions Precautions Precautions: Fall Precaution Comments: painful RLE Restrictions Weight Bearing Restrictions: No    Mobility  Bed Mobility Overal bed mobility: Modified Independent Bed Mobility: Sit to Supine       Sit to supine: Modified independent (Device/Increase time);HOB elevated   General bed mobility  comments: Able to return to bed without difficulties.  Transfers Overall transfer level: Needs assistance Equipment used: Rolling walker (2 wheeled) Transfers: Sit to/from Stand Sit to Stand: Min guard         General transfer comment: Min guard for safety. [facility]. Cues for hand placement.  Ambulation/Gait Ambulation/Gait assistance: Min guard Gait Distance (Feet): 5 Feet Assistive device: Rolling walker (2 wheeled) Gait Pattern/deviations: Step-to pattern     General Gait Details: Step to gait pattern with painful RLE walking on toe; adamant about not walking at all. Assisted short distance back to bed with RW. Needed assist for line management.   Stairs             Wheelchair Mobility    Modified Rankin (Stroke Patients Only)       Balance Overall balance assessment: Needs assistance Sitting-balance support: Feet supported;No upper extremity supported Sitting balance-Leahy Scale: Good Sitting balance - Comments: Able to perform pericare sitting on BSC leaning without LOB.   Standing balance support: During functional activity Standing balance-Leahy Scale: Poor Standing balance comment: Reliant on UE support to offload painful RLE.                            Cognition Arousal/Alertness: Awake/alert Behavior During Therapy: Flat affect(easily irritated) Overall Cognitive Status: No family/caregiver present to determine baseline cognitive functioning Area of Impairment: Attention;Safety/judgement;Awareness                   Current Attention Level: Selective  Following Commands: Follows one step commands with increased time;Follows one step commands consistently Safety/Judgement: Decreased awareness of safety;Decreased awareness of deficits Awareness: Emergent Problem Solving: Slow processing;Decreased initiation;Requires verbal cues;Requires tactile cues General Comments: Pt easily irritated about discussions related to  walking; " I just cannot do it, my leg hurts too much." Despite max education/reasoning, pt adamantly refusing to walk despite wanting to go home. Poor awareness of current condition.      Exercises      General Comments General comments (skin integrity, edema, etc.): "I know what you want me to do and I will do it, but not right now, give me time to settle." This is after returning for a second time, getting pt premedicated and pt agreeing to see PT after pain meds      Pertinent Vitals/Pain Pain Assessment: Faces Faces Pain Scale: Hurts whole lot Pain Location: RLE Pain Descriptors / Indicators: Discomfort;Sore;Tender Pain Intervention(s): Monitored during session;Premedicated before session;Limited activity within patient's tolerance    Home Living                      Prior Function            PT Goals (current goals can now be found in the care plan section) Acute Rehab PT Goals Patient Stated Goal: shower Progress towards PT goals: Not progressing toward goals - comment(pain and self limiting)    Frequency    Min 3X/week      PT Plan Discharge plan needs to be updated    Co-evaluation              AM-PAC PT "6 Clicks" Mobility   Outcome Measure  Help needed turning from your back to your side while in a flat bed without using bedrails?: None Help needed moving from lying on your back to sitting on the side of a flat bed without using bedrails?: None Help needed moving to and from a bed to a chair (including a wheelchair)?: A Little Help needed standing up from a chair using your arms (e.g., wheelchair or bedside chair)?: A Little Help needed to walk in hospital room?: A Little Help needed climbing 3-5 steps with a railing? : A Lot 6 Click Score: 19    End of Session   Activity Tolerance: Patient limited by pain Patient left: in bed;with call bell/phone within reach;with bed alarm set Nurse Communication: Mobility status PT Visit Diagnosis:  Other abnormalities of gait and mobility (R26.89);Muscle weakness (generalized) (M62.81);Difficulty in walking, not elsewhere classified (R26.2)     Time: 2353-6144 PT Time Calculation (min) (ACUTE ONLY): 17 min  Charges:  $Therapeutic Activity: 8-22 mins                     Wray Kearns, PT, DPT Acute Rehabilitation Services Pager [phone] Office [place] 02/02/2019, 3:55 PM

## 2019-02-03 ENCOUNTER — Inpatient Hospital Stay (HOSPITAL_COMMUNITY): Payer: Medicaid Other

## 2019-02-03 LAB — RENAL FUNCTION PANEL
Albumin: 2.3 g/dL — ABNORMAL LOW (ref 3.5–5.0)
Anion gap: 10 (ref 5–15)
BUN: 24 mg/dL — ABNORMAL HIGH (ref 8–23)
CO2: 26 mmol/L (ref 22–32)
Calcium: 8.2 mg/dL — ABNORMAL LOW (ref 8.9–10.3)
Chloride: 97 mmol/L — ABNORMAL LOW (ref 98–111)
Creatinine, Ser: 0.73 mg/dL (ref 0.61–1.24)
GFR calc Af Amer: >60 mL/min (ref >60)
GFR calc non Af Amer: >60 mL/min (ref >60)
Glucose, Bld: 216 mg/dL — ABNORMAL HIGH (ref 70–99)
Phosphorus: 3.4 mg/dL (ref 2.5–4.6)
Potassium: 4.2 mmol/L (ref 3.5–5.1)
Sodium: 133 mmol/L — ABNORMAL LOW (ref 135–145)

## 2019-02-03 LAB — BASIC METABOLIC PANEL
Anion gap: 10 (ref 5–15)
BUN: 26 mg/dL — ABNORMAL HIGH (ref 8–23)
CO2: 27 mmol/L (ref 22–32)
Calcium: 8.3 mg/dL — ABNORMAL LOW (ref 8.9–10.3)
Chloride: 100 mmol/L (ref 98–111)
Creatinine, Ser: 0.92 mg/dL (ref 0.61–1.24)
GFR calc Af Amer: >60 mL/min (ref >60)
GFR calc non Af Amer: >60 mL/min (ref >60)
Glucose, Bld: 153 mg/dL — ABNORMAL HIGH (ref 70–99)
Potassium: 4.1 mmol/L (ref 3.5–5.1)
Sodium: 137 mmol/L (ref 135–145)

## 2019-02-03 LAB — GLUCOSE, CAPILLARY
Glucose-Capillary: 117 mg/dL — ABNORMAL HIGH (ref 70–99)
Glucose-Capillary: 170 mg/dL — ABNORMAL HIGH (ref 70–99)
Glucose-Capillary: 182 mg/dL — ABNORMAL HIGH (ref 70–99)
Glucose-Capillary: 196 mg/dL — ABNORMAL HIGH (ref 70–99)
Glucose-Capillary: 198 mg/dL — ABNORMAL HIGH (ref 70–99)
Glucose-Capillary: 232 mg/dL — ABNORMAL HIGH (ref 70–99)

## 2019-02-03 LAB — CBC WITH DIFFERENTIAL/PLATELET
Abs Immature Granulocytes: 0.03 10*3/uL (ref 0.00–0.07)
Basophils Absolute: 0.1 10*3/uL (ref 0.0–0.1)
Basophils Relative: 1 %
Eosinophils Absolute: 0.1 10*3/uL (ref 0.0–0.5)
Eosinophils Relative: 2 %
HCT: 29.9 % — ABNORMAL LOW (ref 39.0–52.0)
Hemoglobin: 9.6 g/dL — ABNORMAL LOW (ref 13.0–17.0)
Immature Granulocytes: 0 %
Lymphocytes Relative: 24 %
Lymphs Abs: 1.6 10*3/uL (ref 0.7–4.0)
MCH: 31.6 pg (ref 26.0–34.0)
MCHC: 32.1 g/dL (ref 30.0–36.0)
MCV: 98.4 fL (ref 80.0–100.0)
Monocytes Absolute: 0.6 10*3/uL (ref 0.1–1.0)
Monocytes Relative: 9 %
Neutro Abs: 4.3 10*3/uL (ref 1.7–7.7)
Neutrophils Relative %: 64 %
Platelets: 370 10*3/uL (ref 150–400)
RBC: 3.04 MIL/uL — ABNORMAL LOW (ref 4.22–5.81)
RDW: 14.7 % (ref 11.5–15.5)
WBC: 6.7 10*3/uL (ref 4.0–10.5)
nRBC: 0 % (ref 0.0–0.2)

## 2019-02-03 LAB — BLOOD GAS, VENOUS
Acid-Base Excess: 3.5 mmol/L — ABNORMAL HIGH (ref 0.0–2.0)
Bicarbonate: 27.1 mmol/L (ref 20.0–28.0)
O2 Saturation: 59 %
Patient temperature: 98.6
pCO2, Ven: 37.8 mmHg — ABNORMAL LOW (ref 44.0–60.0)
pH, Ven: 7.469 — ABNORMAL HIGH (ref 7.250–7.430)
pO2, Ven: 32.9 mmHg (ref 32.0–45.0)

## 2019-02-03 LAB — LACTIC ACID, PLASMA: Lactic Acid, Venous: 1.4 mmol/L (ref 0.5–1.9)

## 2019-02-03 MED ORDER — HALOPERIDOL LACTATE 5 MG/ML IJ SOLN
INTRAMUSCULAR | Status: AC
  Filled 2019-02-03: qty 1

## 2019-02-03 MED ORDER — FUROSEMIDE 10 MG/ML IJ SOLN
40.0000 mg | Freq: Once | INTRAMUSCULAR | Status: AC
  Administered 2019-02-03: 40 mg via INTRAVENOUS
  Filled 2019-02-03: qty 4

## 2019-02-03 MED ORDER — OLANZAPINE 10 MG IM SOLR
5.0000 mg | Freq: Once | INTRAMUSCULAR | Status: AC
  Administered 2019-02-03: 5 mg via INTRAMUSCULAR
  Filled 2019-02-03: qty 10

## 2019-02-03 MED ORDER — NITROGLYCERIN IN D5W 200-5 MCG/ML-% IV SOLN
0.0000 ug/min | INTRAVENOUS | Status: DC
  Administered 2019-02-03: 20 ug/min via INTRAVENOUS
  Administered 2019-02-03: 25 ug/min via INTRAVENOUS
  Administered 2019-02-03: 5 ug/min via INTRAVENOUS
  Filled 2019-02-03: qty 250

## 2019-02-03 MED ORDER — HALOPERIDOL LACTATE 5 MG/ML IJ SOLN
5.0000 mg | Freq: Once | INTRAMUSCULAR | Status: AC
  Administered 2019-02-03: 5 mg via INTRAVENOUS
  Filled 2019-02-03: qty 1

## 2019-02-03 MED ORDER — LABETALOL HCL 5 MG/ML IV SOLN: 5.0000 mg | Freq: Once | INTRAVENOUS | Status: DC

## 2019-02-03 MED ORDER — LORAZEPAM 2 MG/ML IJ SOLN: 2.0000 mg | Freq: Once | INTRAMUSCULAR | Status: DC

## 2019-02-03 MED ORDER — LABETALOL HCL 5 MG/ML IV SOLN
20.0000 mg | Freq: Once | INTRAVENOUS | Status: AC
  Administered 2019-02-03: 20 mg via INTRAVENOUS
  Filled 2019-02-03: qty 4

## 2019-02-03 MED ORDER — LABETALOL HCL 5 MG/ML IV SOLN
10.0000 mg | Freq: Once | INTRAVENOUS | Status: AC
  Administered 2019-02-03: 10 mg via INTRAVENOUS
  Filled 2019-02-03: qty 4

## 2019-02-03 MED ORDER — HALOPERIDOL LACTATE 5 MG/ML IJ SOLN
2.0000 mg | Freq: Once | INTRAMUSCULAR | Status: AC
  Administered 2019-02-03: 2 mg via INTRAVENOUS

## 2019-02-03 NOTE — Progress Notes (Addendum)
Paged around 0430 for sudden acute onset of shortness of breath, tachypnea, and tachycardia. RN notes that patient exhibited increased confusion tonight. Pt was more aggitated and was continuously getting out bed, stating that he needed to go "chop some wood". Dr. [name] and myself assessed patient at bedside. We agreed patient was agitated and was tachycardic and tachypnic. Pt was maintaining O2 sats >92% on 2L [place].  Chart reviewed. Tachycardia has been present throughout hospital stay. Tachypnea appears to have began around 0300 this morning and progressively worsened. Hypertension has been present throughout hospital stay. Pt has remained afebrile for the past week at least. Pt was alert and oriented to person only.  Pt denies chest pain and says he feels slightly out of breath.  EKG ordered--our impression is no significant change since prior EKG aside from tachycardia. No ST changes appreciated. Awaiting official read.  CXR ordered--Dr. [name] and my impression is no change since last CXR on 7/31. Diffuse atelectasis. Awaiting read.  CBC and BMP pending.  Impression: Tachypnea and O2 requirement are the primary acute changes. Pt was fairly agitated with staff which is likely contributing to his sx. 2mg  Haldol given and will look for response.  Pt did have some infiltrates in the past and was treated for pneumonia. Last CBC was on 8/6 at which no no leukocytosis was present. Given lack of fever and acuity of sx, pneumonia or sepsis is unlikely.  Our impression of EKG was tachycardic sinus rhythm without any interval ST changes. Pt denied chest pain. ACS or dysrhythmia less likely for this reason. BMP from yesterday did not reveal any electroyte disturbance. Ca was 7.8 however, corrected for albumin is 9.2.   Our impression is no pneumothorax on CXR.  Pt has been receiving VTE prophylaxis with lovenox. Doppler US on 8/4 did not indicate a thrombosis. CXR did not reveal peripheral findings that would  be consistent with PE. D dimer was deferred as it may be elevated from other comorbidities and therefore would be difficult to interpret. If sx do persist after the haldol, then CTA chest should be considered.  Medications were reviewed and sx unlikely to be attributable to any withdrawal. Given length of hospital stay, ETOH withdrawal is fairly unlikely. Pt has been receiving ativan and therefore is unlikely to be contributing as a withdrawal agent.  Addendum 1: was paged around 0600 for continued agitation and tachypnea. Dr. [name] and myself observed patient in room with RN at bedside. Pt was resting comfortably while present. RR 32. O2 sats without supplemental O2 was 87.  Due to increased needs of patient, pt will be transferred to progressive unit. Order placed.

## 2019-02-03 NOTE — Progress Notes (Signed)
Paged to bedside the patient was having worsening tachypnea, tachycardia, and hypoxia. Presented to the bedside with Dr. [name]. After evaluating the patient we pursue chest x-ray that illustrated worsening pulmonary edema bilateral pleural effusions. He was given another dose of IV furosemide and started on a nitroglycerin drip.  Approximately two hours later presented to the bedside to reevaluate the patient. His tachycardia and tachypnea have improved. He is saturating in the low 90s on 4L. He is still very confused but seems to be more comfortable.  We will continue the nitro drip for now. Continue to monitor.

## 2019-02-03 NOTE — Progress Notes (Signed)
Subjective:  [name] was seen lying in bed. His pulse ox and leads were scattered. He states that he just woke up when we came into the room. When asked about last night/this morning he said he had trouble sleeping. He states that he has not had any new onset of pain. When asked about his breathing he said "it's fine I reckon." All questions and concerns were addressed.  Objective:  Vital signs in last 24 hours: Vitals:   02/03/19 0548 02/03/19 0606 02/03/19 0632 02/03/19 0648  BP:  (!) 183/118  (!) 150/104  Pulse:  (!) 133 (!) 125 (!) 117  Resp:  (!) 36 (!) 32 (!) 28  Temp:      TempSrc:      SpO2: (!) 73% (!) 83% (!) 86% (!) 86%  Weight:      Height:       Physical Exam Vitals signs and nursing note reviewed.  Constitutional:      General: He is not in acute distress.    Appearance: Normal appearance. He is not ill-appearing, toxic-appearing or diaphoretic.  HENT:     Head: Normocephalic and atraumatic.  Cardiovascular:     Rate and Rhythm: Regular rhythm. Tachycardia present.     Pulses: Normal pulses.     Heart sounds: Normal heart sounds. No murmur. No friction rub. No gallop.   Pulmonary:     Effort: Pulmonary effort is normal. No respiratory distress.     Breath sounds: No stridor. Wheezing present. No rales.     Comments: Bibasilar crackles auscultated bilaterally. Chest:     Chest wall: No tenderness.  Abdominal:     General: Abdomen is flat. Bowel sounds are normal. There is no distension.     Palpations: Abdomen is soft.     Tenderness: There is no abdominal tenderness. There is no guarding or rebound.  Musculoskeletal:        General: No swelling, tenderness or signs of injury.  Skin:    General: Skin is warm.     Coloration: Skin is not jaundiced.     Findings: No bruising or erythema.  Neurological:     Mental Status: He is alert.     Assessment/Plan:  Principal Problem:   MSSA bacteremia Active Problems:   Malnutrition of moderate degree    Pressure injury of skin   Palliative care encounter   Critical illness myopathy   Abscess  MSSA Bacteremia 2/2 Soft Tissue Abscess Myositis and LUE Cellulitis: - Afibrile - Continue Ancef through 02/24/2019 -Wound dressing currently clean with nosigns of pus or drainage.  Right Lower Extremity Edema - Wound culturesgrowing Staph Aureus. - Susceptibilities:              - Resistant: Ciprofloxacin             - Susceptible: Clndamycin, Erythromcyin, Gentamicin, Inducible clindamycin, oxacillin, rifampin, tetracycline, trimeth/sulfa, and vancomycin.  -Appreciate ortho evaluation and recs.  Altered Mental Status:  - Xray chest: Shows interstitial opacity with hazy airspace density about the hila and small pleural effusions. - Possibly due to hypoxia vs pain medication administration before the event vs sundowning. - Haldol 2 mg  DM  - CBG goal: <180 - Continue sliding scale insulin -Levemir16 units  SVT:  - Continue to monitor  - No LE VTE per duplex 01/23/2019  Anemia of Critical Illness - Continue to monitor  Hypertension:  - ContinueLisinopril to 40 mg QD - Ordered Furosemide 4 mg Once   Acute Hypoxic  Respiratory Failure Steno PNA. Resolved Encephalopathy/EtOH Withdrawal. Resolved Dysphagia. Resolved  Dispo: Anticipated discharge pending medical course.   [name], MD 02/03/2019, 7:47 AM Pager: [phone]

## 2019-02-03 NOTE — Progress Notes (Signed)
Noted pt pulled all cardiac monitoring leads  and gown off. When informing pt that was going to put leads back on pt refused and states he will call the police. Pt made aware that heart was being monitored. Pt stated he did not care. C.n.a. Mendel Ryder then attempted to put leads on-pt refused. Primary care nurse was made aware by c.n.a.

## 2019-02-03 NOTE — Progress Notes (Signed)
Patient treying to get up, pulled nasal cannula and pulse oximeter. Refused to put back. Had his coffee bu refused to eat.

## 2019-02-03 NOTE — Progress Notes (Signed)
IMTS Attending:  Full note to follow.  As noted patient was noted to be tachypneic and tachycardic overnight with some hypoxia.  On my evaluation of him this morning he appeared anxious, cardiac exam was notable for tachycardia pulmonary exam was notable for bibasilar crackles with decreased bibasilar breath sounds.  Right lower extremity swelling and induration continues to improve no new left lower extremity edema. We will transfer patient to progressive care, ordered dose of IV Lasix with good urine output.  We just reevaluated him this afternoon with our point-of-care ultrasound point-of-care ultrasound reveals moderate basilar pleural effusions bilaterally with some compressive atelectasis.  Lung ultrasound reveals diffuse B-lines.  Cardiac ultrasound, left ventricular function overall appears vigorous difficult to completely assess given tachycardia right ventricular function appears preserved no dilation of right ventricle.  I suspect his tachycardia, hypoxia and dyspnea are all related to pulmonary edema and pleural effusions.  We will keep him closely monitored in progressive care supplemental oxygen as needed BiPAP if needed, and continued IV diuresis.  I do not think that we need to obtain a CT angiogram to evaluate for pulmonary embolism given the constellation of findings above however this certainly could be re-entertained if he does not improve as expected with diuresis.

## 2019-02-03 NOTE — Progress Notes (Signed)
Pt restless,confused. Asked me how much wood we need to chop. O2 sat 86%RA,BP 160/107,HR 125,respirations up to 30,using accessory muscles. O2 2Lapplied. O2 sat increased to 95%. Dr.Christian notifiedof above info

## 2019-02-03 NOTE — Significant Event (Signed)
Rapid Response Event Note  Overview: Multiple issues; extreme agitation, SOB, SpO2 88 Time Called: 0610 Arrival Time: 0613 Event Type: Neurologic, Respiratory  Initial Focused Assessment: Pt sitting on side of bed pulling pulse ox off, demanding to get up, disoriented. T 98.2 O, BP 150/104, HR 117, RR 32, SpO2 86% RA. Lungs coarse throughout, pt has been confused throughout night with increasing agitation, SpO2 95% on RA earlier in night.    Interventions: TS called earlier in night- CXR, Haldol, D-dimer, CBC, EKG ordered  CXR shows small pleural effusions  TS called again PTA RRT- transfer to progressive care for closer monitoring   Plan of Care (if not transferred): Tx to PCU for closer monitoring  Event Summary: Name of Physician Notified: Frederick Hamman MD at (PTA RRT)    at    Outcome: Transferred (Comment)(Progressive)     Frederick Moody

## 2019-02-03 NOTE — Progress Notes (Signed)
For transfer to [place], report given

## 2019-02-03 NOTE — Progress Notes (Signed)
Patient refusing to wear monitor, pulse ox, blood pressure cuff and pulling at IV lines. Patient hallucinating and verbally aggressive towards staff. Of note, patient has been confused/hallucinating since patient was transferred and MD aware. MD has evaluated patient several times throughout the day for mental status, heart rate, blood pressure and resp. status.   Charge nurse, [place], and I made several attempts to get patient to allow [place] to reapply equipment. Patient educated on importance of monitoring his vital signs. Patient refused. Paged MD to discuss plan of care as patient has been removing ECG leads and pulse ox continuously throughout the day. PRN medications have been given without much improvement and we are unable to monitor the patient's ECG and vitals currently.  Orders for soft wrist restraints were placed and initiated at 1845 to ensure proper monitoring of patient. MD came to bedside to assess patient after bilateral wrist restraints applied. MD discussed with night shift RN that the team would be up on the unit to round on the patient this evening and encouraged to page with any concerns. Will continue to monitor.

## 2019-02-03 NOTE — Accreditation Note (Signed)
Pt remains restless,agitated,confused,tachypneic.O2 sats in the 80's on room air. Notified Dr. Darrick Meigs.

## 2019-02-04 LAB — RENAL FUNCTION PANEL
Albumin: 2.2 g/dL — ABNORMAL LOW (ref 3.5–5.0)
Anion gap: 10 (ref 5–15)
BUN: 25 mg/dL — ABNORMAL HIGH (ref 8–23)
CO2: 25 mmol/L (ref 22–32)
Calcium: 8.4 mg/dL — ABNORMAL LOW (ref 8.9–10.3)
Chloride: 101 mmol/L (ref 98–111)
Creatinine, Ser: 0.86 mg/dL (ref 0.61–1.24)
GFR calc Af Amer: >60 mL/min (ref >60)
GFR calc non Af Amer: >60 mL/min (ref >60)
Glucose, Bld: 233 mg/dL — ABNORMAL HIGH (ref 70–99)
Phosphorus: 4.6 mg/dL (ref 2.5–4.6)
Potassium: 4.2 mmol/L (ref 3.5–5.1)
Sodium: 136 mmol/L (ref 135–145)

## 2019-02-04 LAB — GLUCOSE, CAPILLARY
Glucose-Capillary: 200 mg/dL — ABNORMAL HIGH (ref 70–99)
Glucose-Capillary: 159 mg/dL — ABNORMAL HIGH (ref 70–99)
Glucose-Capillary: 53 mg/dL — ABNORMAL LOW (ref 70–99)
Glucose-Capillary: 56 mg/dL — ABNORMAL LOW (ref 70–99)
Glucose-Capillary: 87 mg/dL (ref 70–99)
Glucose-Capillary: 137 mg/dL — ABNORMAL HIGH (ref 70–99)

## 2019-02-04 MED ORDER — LABETALOL HCL 5 MG/ML IV SOLN
5.0000 mg | INTRAVENOUS | Status: DC | PRN
  Administered 2019-02-04: 5 mg via INTRAVENOUS
  Filled 2019-02-04: qty 4

## 2019-02-04 MED ORDER — AMLODIPINE BESYLATE 5 MG PO TABS
5.0000 mg | ORAL_TABLET | Freq: Every day | ORAL | Status: DC
  Administered 2019-02-04 – 2019-02-09 (×6): 5 mg via ORAL
  Filled 2019-02-04 (×6): qty 1

## 2019-02-04 MED ORDER — FUROSEMIDE 10 MG/ML IJ SOLN
40.0000 mg | Freq: Three times a day (TID) | INTRAMUSCULAR | Status: DC
  Administered 2019-02-04 – 2019-02-05 (×5): 40 mg via INTRAVENOUS
  Filled 2019-02-04 (×5): qty 4

## 2019-02-04 NOTE — Progress Notes (Signed)
Hypoglycemic Event  CBG: 56  Treatment: Orange Juice  Symptoms: n/a  Follow-up CBG: Time:1725 CBG Result:87  Possible Reasons for Event: decreased oral intake  Comments/MD notified:1st contact pager    Doretha Sou

## 2019-02-04 NOTE — Progress Notes (Signed)
PHARMACY CONSULT NOTE FOR:  OUTPATIENT  PARENTERAL ANTIBIOTIC THERAPY (OPAT)  Indication: MSSA bacteremia Regimen: Cefazolin 2g IV every 8 hours End date: 02/24/2019  IV antibiotic discharge orders are pended. To discharging provider:  please sign these orders via discharge navigator,  Select New Orders & click on the button choice - Manage This Unsigned Work.     Thank you for allowing pharmacy to be a part of this patient's care.  Jimmy Footman, PharmD, BCPS, BCIDP Infectious Diseases Clinical Pharmacist Phone: [phone] 8:45 AM

## 2019-02-04 NOTE — Progress Notes (Signed)
ID Brief Progress Note (to clarify antimicrobial length of therapy):  Frederick Moody is a 63 y.o. male with complicated admission in the setting of MSSA bacteremia due to a large vertebral soft tissue/paraspinal abscess. He cleared cultures within 24 hours of appropriate therapy however continued to fever and have leukocytosis until I&D of back on 7/21.   TTE negative for vegetations and preserved EF on 7/17. No TEE was obtained as he was already committed to prolonged therapy for vertebral infection and no findings on TTE suggestive of SBE.   To clarify, would continue IV cefazolin through 6 weeks from his surgery date until 02/24/2019.   He has follow up with Dr. Linus Salmons scheduled on August 24th @ 9:30 am should he still be admitted to the hospital at this time we can re-schedule this appointment.   Minimum safety labs on therapy (although it appears his condition warrants more frequent monitoring currently)  - CBC weekly - BMP weekly - ESR/CRP prior to discharge    Janene Madeira, MSN, NP-C [facility] [name] Pager: [phone] Office: [phone] [place]: [phone]

## 2019-02-04 NOTE — Progress Notes (Addendum)
   Subjective: Frederick Moody was seen at bedside. He was accompanied by two nurses. He was altered and unable to participate in the interview. His nurses state that he did have another tachycardic event last night and required 5L of O2. They state that he has been doing better than yesterday as far as his vitals are concerned. His medical plan was discussed with the nursing staff. All questions and concerns were addressed.   Objective:  Vital signs in last 24 hours: Vitals:   02/03/19 2300 02/03/19 2327 02/04/19 0300 02/04/19 0746  BP: (!) 172/112 124/81 (!) 158/101 125/84  Pulse: (!) 125 (!) 104 (!) 118 (!) 118  Resp: (!) 39 (!) 29 (!) 33 (!) 24  Temp:  97.8 F (36.6 C) 97.9 F (36.6 C) 97.8 F (36.6 C)  TempSrc:  Oral Oral Axillary  SpO2: 92% 98% 91% 99%  Weight:   81.4 kg   Height:       Physical Exam Constitutional:      General: He is not in acute distress.    Appearance: He is not ill-appearing, toxic-appearing or diaphoretic.     Comments: Obtunded   HENT:     Head: Normocephalic and atraumatic.  Cardiovascular:     Rate and Rhythm: Normal rate and regular rhythm.     Heart sounds: Normal heart sounds. No murmur. No friction rub. No gallop.   Pulmonary:     Breath sounds: Wheezing present.     Comments: tachypnic RR 21-30s during interview. Bibasilar crackles auscultated bilaterally.  Abdominal:     General: Abdomen is flat. Bowel sounds are normal.  Neurological:     Mental Status: He is disoriented.     Assessment/Plan:  Principal Problem:   MSSA bacteremia Active Problems:   Malnutrition of moderate degree   Pressure injury of skin   Palliative care encounter   Critical illness myopathy   Abscess  MSSA Bacteremia 2/2 Soft Tissue Abscess Myositis and LUE Cellulitis: - Afibrile - Continue Ancef through 02/24/2019 -Wound dressing currently clean with nosigns of pus or drainage.  Right Lower Extremity Edema - Wound culturesgrowing Staph Aureus. -  Susceptibilities:  - Resistant: Ciprofloxacin - Susceptible: Clndamycin, Erythromcyin, Gentamicin, Inducible clindamycin, oxacillin, rifampin, tetracycline, trimeth/sulfa, and vancomycin.  -Appreciate ortho evaluation and recs.  Altered Mental Status:  - Xray chest: Bilateral pleural effusions and diffuse interstitial densities.  - Thought to be due dyspnea and hypoxia 2/2 to pulmonary edema. - Ativan PRN  Pulmonary Edema: - Continue Furosemide 40 mg  - Ordered Amlodipine 5 mg  -  Nitro 50 mg in dextrose 5% drip  DM  - CBG goal: <180 - Continue sliding scale insulin -Levemir16 units  SVT:  - Continue to monitor  - No LE VTE per duplex 01/23/2019  Anemia of Critical Illness - Continue to monitor  Hypertension:  -ContinueLisinopril to 40 mg QD - Labetalol 5 mg PRN    Acute Hypoxic Respiratory Failure Steno PNA. Resolved Encephalopathy/EtOH Withdrawal. Resolved Dysphagia. Resolved  Dispo: Anticipated discharge pending medical course.    [name], MD 02/04/2019, 8:12 AM Pager: [phone]

## 2019-02-04 NOTE — Progress Notes (Signed)
PT Cancellation Note  Patient Details Name: Frederick Moody MRN: [id number] DOB: March 25, 1956   Cancelled Treatment:    Reason Eval/Treat Not Completed: Other (comment). Pt with incr confusion/agitation and nursing request defer.    Shary Decamp [facility] 02/04/2019, 9:55 AM [place] Pager [phone] Office [phone]

## 2019-02-04 NOTE — Progress Notes (Signed)
OT Cancellation Note  Patient Details Name: Frederick Moody MRN: [id number] DOB: 08-Jun-1956   Cancelled Treatment:    Reason Eval/Treat Not Completed: Other (comment); noted pt with significant event yesterday (rapid response called) and pt transferred to progressive unit. Spoke with RN who request OT hold at this time, pt sill agitated/confused and efforts being made to adjust medications. Will continue to follow.  Lou Cal, OT [facility] Pager [phone] Office [phone]   Raymondo Band 02/04/2019, 9:05 AM

## 2019-02-04 NOTE — Progress Notes (Signed)
Paged around 22:45 regarding increased tachypnea and increased O2 requirement (5L compared to 2L few hours earlier)--RR mid 30s. O2 sats 94% on 5L. Dr. [name] and myself assessed patient at bedside. Lung sounds were slightly improved from last night. Patient was restless. CXR revealed Bilateral pleural effusions and diffuse interstitial densities relatively similar to prior radiograph. No pneumothorax. Stable cardiac silhouette. Additionally, patient continues to be severely hypertensive despite being on nitro drip. Current level of nursing care unable to do numerous titrations so should he require continued titration, higher level of care may be necessary. At time we left floor, pt's VS had returned to baseline of where they were today.

## 2019-02-04 NOTE — Progress Notes (Signed)
Attempted to reach out to Frederick Moody's sister via the phone number in his chart at 1430 and 1520 to update her on her brother's condition. Will continue to reach out to her.  [name] MD

## 2019-02-04 NOTE — Progress Notes (Signed)
CSW acknowledges consult for SNF. Per RNCM, Magdalen Spatz, note; the patient is refusing skilled nursing placement. He is set up to go home with [facility] for IV infusion. The patient will return to his brother who will assist him.   The patient will need medication match. CSW will continue to follow for assistance   Domenic Schwab, MSW, [place] Worker [facility]  [phone]

## 2019-02-05 ENCOUNTER — Inpatient Hospital Stay (HOSPITAL_COMMUNITY): Payer: Medicaid Other

## 2019-02-05 DIAGNOSIS — E11649 Type 2 diabetes mellitus with hypoglycemia without coma: Secondary | ICD-10-CM

## 2019-02-05 DIAGNOSIS — J90 Pleural effusion, not elsewhere classified: Secondary | ICD-10-CM

## 2019-02-05 DIAGNOSIS — G9341 Metabolic encephalopathy: Secondary | ICD-10-CM

## 2019-02-05 LAB — CBC WITH DIFFERENTIAL/PLATELET
Abs Immature Granulocytes: 0.02 10*3/uL (ref 0.00–0.07)
Basophils Absolute: 0 10*3/uL (ref 0.0–0.1)
Basophils Relative: 1 %
Eosinophils Absolute: 0.1 10*3/uL (ref 0.0–0.5)
Eosinophils Relative: 2 %
HCT: 27.7 % — ABNORMAL LOW (ref 39.0–52.0)
Hemoglobin: 8.8 g/dL — ABNORMAL LOW (ref 13.0–17.0)
Immature Granulocytes: 0 %
Lymphocytes Relative: 19 %
Lymphs Abs: 1.1 10*3/uL (ref 0.7–4.0)
MCH: 31.8 pg (ref 26.0–34.0)
MCHC: 31.8 g/dL (ref 30.0–36.0)
MCV: 100 fL (ref 80.0–100.0)
Monocytes Absolute: 0.4 10*3/uL (ref 0.1–1.0)
Monocytes Relative: 7 %
Neutro Abs: 3.9 10*3/uL (ref 1.7–7.7)
Neutrophils Relative %: 71 %
Platelets: 299 10*3/uL (ref 150–400)
RBC: 2.77 MIL/uL — ABNORMAL LOW (ref 4.22–5.81)
RDW: 15.2 % (ref 11.5–15.5)
WBC: 5.5 10*3/uL (ref 4.0–10.5)
nRBC: 0 % (ref 0.0–0.2)

## 2019-02-05 LAB — LACTATE DEHYDROGENASE: LDH: 159 U/L (ref 98–192)

## 2019-02-05 LAB — GLUCOSE, CAPILLARY
Glucose-Capillary: 186 mg/dL — ABNORMAL HIGH (ref 70–99)
Glucose-Capillary: 160 mg/dL — ABNORMAL HIGH (ref 70–99)
Glucose-Capillary: 202 mg/dL — ABNORMAL HIGH (ref 70–99)
Glucose-Capillary: 167 mg/dL — ABNORMAL HIGH (ref 70–99)
Glucose-Capillary: 236 mg/dL — ABNORMAL HIGH (ref 70–99)

## 2019-02-05 LAB — COMPREHENSIVE METABOLIC PANEL
ALT: 9 U/L (ref 0–44)
AST: 35 U/L (ref 15–41)
Albumin: 2.1 g/dL — ABNORMAL LOW (ref 3.5–5.0)
Alkaline Phosphatase: 67 U/L (ref 38–126)
Anion gap: 10 (ref 5–15)
BUN: 20 mg/dL (ref 8–23)
CO2: 27 mmol/L (ref 22–32)
Calcium: 8.5 mg/dL — ABNORMAL LOW (ref 8.9–10.3)
Chloride: 101 mmol/L (ref 98–111)
Creatinine, Ser: 0.84 mg/dL (ref 0.61–1.24)
GFR calc Af Amer: >60 mL/min (ref >60)
GFR calc non Af Amer: >60 mL/min (ref >60)
Glucose, Bld: 203 mg/dL — ABNORMAL HIGH (ref 70–99)
Potassium: 4 mmol/L (ref 3.5–5.1)
Sodium: 138 mmol/L (ref 135–145)
Total Bilirubin: 0.9 mg/dL (ref 0.3–1.2)
Total Protein: 7 g/dL (ref 6.5–8.1)

## 2019-02-05 LAB — MAGNESIUM: Magnesium: 1.4 mg/dL — ABNORMAL LOW (ref 1.7–2.4)

## 2019-02-05 LAB — SARS CORONAVIRUS 2 (TAT 6-24 HRS): SARS Coronavirus 2: NEGATIVE

## 2019-02-05 MED ORDER — SODIUM CHLORIDE 0.9 % IV BOLUS
500.0000 mL | Freq: Once | INTRAVENOUS | Status: AC
  Administered 2019-02-05: 500 mL via INTRAVENOUS

## 2019-02-05 MED ORDER — PIPERACILLIN-TAZOBACTAM 3.375 G IVPB
3.3750 g | Freq: Three times a day (TID) | INTRAVENOUS | Status: DC
  Administered 2019-02-05 – 2019-02-08 (×11): 3.375 g via INTRAVENOUS
  Filled 2019-02-05 (×12): qty 50

## 2019-02-05 MED ORDER — MAGNESIUM SULFATE 2 GM/50ML IV SOLN
2.0000 g | Freq: Once | INTRAVENOUS | Status: AC
  Administered 2019-02-05: 2 g via INTRAVENOUS
  Filled 2019-02-05: qty 50

## 2019-02-05 MED ORDER — MAGNESIUM SULFATE 4 GM/100ML IV SOLN
4.0000 g | Freq: Once | INTRAVENOUS | Status: AC
  Administered 2019-02-05: 4 g via INTRAVENOUS
  Filled 2019-02-05: qty 100

## 2019-02-05 NOTE — Progress Notes (Signed)
Paged early this morning for increased work of breathing and slight decline in spO2. Dr. [name] and myself evaluated patient at bedside. spO2 mid 80s on 15L NR. Diaphoretic. He was using accessory muscles. Significant increase in crackles from prior night. Patient was unresponsive to sternal rub, no withdrawal to pain x4 extremities. No reflex to running sharp object along sole of foot. Pupils were equal and miotic. Sluggish response to light. Patient had received ativan about 2 hours prior. Nitro drip @ 49mL/hr. Chart reviewed. I/Os indicate a consistent UOP over past few days. HOB was raised with good spO2 response--90s on 15L NR. pts symptoms suspicous for stroke, however patient is unable to maintain his O2 sats while laying flat, so CT is unable to be preformed.    Addendum 1: Received another page around 0130 for BP of 88/64 and further decline of respiratory status. Dr. [name] and myself reassessed patient at bedside. Nitro drip stopped with some improvement in blood pressure--111/64. Unfortunately, increase in fluids will likely worsen pulmonary edema. Repeat CXR revealed near complete white out of left lung. Patient did begin to respond to sternal rub. Withdrawal to pain in all extremities with the exception of LUE. Patient did also start to move his other extremities spontaneously however no movement was present in LUE. Pupils were still pinpoint however response to light was improved. RT performed deep suction with improvement of spO2. Given significantly worsening appearance of left lung, zosyn was added to cefazolin for abx coverage. Repeat CBC and blood cultures ordered.  DDX -As stated above, his acute neurological changes are certainly concerning for stroke--likely ischemic, especially in the setting of sudden drop in BP. Due to respiratory status, patient would not likely tolerate CT much less an MRI. If patient becomes more stable, further head imaging may be appropriate however, at this  point, I'm unsure how this would affect his management given his numerous comorbidities.  From a pulmonary standpoint, etiology of acute onset of respiratory failure is still unclear. -Repeat CXR this morning revealed significant worsening of left pleural effusion.  -Lack of WBC and fever does not rule out the possibility of infectious etiology. Patient has already been treated for aspiration pneumonia during this hospital stay. With that in mind, we can speculate that he may not be maintaining his airway appropriately. Additionally, given patient's fairly extended intubation time, aspiration risk is increased. CT chest may improve ability to arrive at dx. However, will have to hold off on this until patient stabilizes. -other causes include sedative induced, stroke, metabolic alkalosis, PE, sepsis.  -ativan should be held. If patient needs something for agitation, other agent, such as haldol should be considered.   -Sudden acute onset of hypotension etiology is also unclear. This could be related to a number of his comorbidities.Sepsis can not be ruled out. qSOFA score is 3 making him high risk. Risk factors include extended hospitalization, ICU admission,  intubation, central line, and ongoing leg abscess. If blood pressure does not respond to discontinuation of nitro drip, repeat echo for evaluation of tamponade, CM change or acute onset mitral/aortic insufficiency may be appropriate. Pneumothorax is not apparent on CXR. Adrenal crisis is also a consideration given his significant and severe status.  Considerations: Patient is very ill with a prognosis that is likely worsened by the events over the past couple of nights. Contact with family in regards to patient's wishes needs to take place to assure that his wishes are honored.

## 2019-02-05 NOTE — Progress Notes (Signed)
Palliative:  Chart reviewed. Overnight events/change in patient condition noted. Discussed case with Dr. Gilford Rile. Dr. Gilford Rile is reaching out to patient's family. We discussed that goals of care have been outlined with patient when he was alert and oriented and detailed in notes from palliative provider Florentina Jenny on 8/1 and 8/8. Dr. Gilford Rile will reach out to palliative if we can be of assistance.  Thank you for involving PMT in this patient's care.  Juel Burrow, DNP, AGNP-C Palliative Medicine Team Team Phone # [phone]  Pager # [phone]  NO CHARGE

## 2019-02-05 NOTE — Progress Notes (Signed)
Occupational Therapy Treatment Patient Details Name: Frederick SizerRobert Moody MRN: [id number] DOB: Apr 17, 1956 Today's Date: 02/05/2019    History of present illness Pt is a 63 y.o. male admitted 01/07/19 with weakness and falls. Worked up for DKA, sepsis with MSSA bacteremia and abscess on back; s/p I&D 7/21. Hospital course complicated by AMS with ETOH withdrawl, possible benzo OD and aspiration; ETT 7/19-7/24, reintubated 7/27-7/29. Pt with continued RLE edema; CT showed large fluid collection; s/p aspiration of R calf on 8/5 with 60cc of fluid removed. PMH includes DM, ETOH abuse.   OT comments  Pt performing grooming tasks at sink with minguardA and cues for sequencing. Pt talking off the wall and requires cues to attend to task. Pt performing bed mobility with modA and transfers with minA. Pt using RW for mobility and requires constant cueing and minA overall for balance deficits. Pt will have anterior and posterior lean. Pt requires seated position for ADL at sink. Pt would greatly benefit from continued OT skilled services for ADL, [facility]. OT following acutely.  No O2. >90% on RA. BP 105/88 after exertion. Max HR 115 for mobility.    Follow Up Recommendations  Home health OT;Supervision/Assistance - 24 hour;SNF(If pt keeps refusing SNF, HHOT should be acquired.)    Equipment Recommendations  Tub/shower seat    Recommendations for Other Services      Precautions / Restrictions Precautions Precautions: Fall Restrictions Weight Bearing Restrictions: No       Mobility Bed Mobility Overal bed mobility: Needs Assistance Bed Mobility: Supine to Sit     Supine to sit: Mod assist     General bed mobility comments: assist for hand placement and BLEs   Transfers Overall transfer level: Needs assistance Equipment used: Rolling walker (2 wheeled) Transfers: Sit to/from Stand Sit to Stand: Min assist         General transfer comment:  Assist to bring hips up and for balance.     Balance Overall balance assessment: Needs assistance Sitting-balance support: Feet supported Sitting balance-Leahy Scale: Fair     Standing balance support: Single extremity supported Standing balance-Leahy Scale: Poor Standing balance comment: dyanmic and static standing at sink                           ADL either performed or assessed with clinical judgement   ADL Overall ADL's : Needs assistance/impaired     Grooming: Wash/dry hands;Wash/dry face;Oral care;Applying deodorant;Brushing hair;Min guard;Sitting;Cueing for sequencing Grooming Details (indicate cue type and reason): seated for task                             Functional mobility during ADLs: Minimal assistance;Rolling walker General ADL Comments: Pt increasing function, but unable to stand for ADL. Pt had to sit.     Vision   Vision Assessment?: No apparent visual deficits   Perception     Praxis      Cognition Arousal/Alertness: Awake/alert Behavior During Therapy: Flat affect Overall Cognitive Status: No family/caregiver present to determine baseline cognitive functioning Area of Impairment: Safety/judgement;Awareness;Memory;Following commands;Problem solving                     Memory: Decreased short-term memory Following Commands: Follows one step commands with increased time;Follows one step commands consistently Safety/Judgement: Decreased awareness of safety;Decreased awareness of deficits Awareness: Emergent Problem Solving: Slow processing;Requires verbal cues;Decreased  initiation General Comments: Pt talking off the wall and knew he was in a hospital, but thought he was in [place].        Exercises     Shoulder Instructions       General Comments No O2. >90% on RA. BP 105/88 after exertion.    Pertinent Vitals/ Pain       Pain Assessment: Faces Faces Pain Scale: No hurt Pain Location: RLE Pain  Descriptors / Indicators: Discomfort Pain Intervention(s): Monitored during session  Home Living                                          Prior Functioning/Environment              Frequency  Min 2X/week        Progress Toward Goals  OT Goals(current goals can now be found in the care plan section)  Progress towards OT goals: Progressing toward goals  Acute Rehab OT Goals Patient Stated Goal: shower OT Goal Formulation: With patient Time For Goal Achievement: 02/18/19 Potential to Achieve Goals: Good ADL Goals Pt Will Perform Grooming: with supervision;sitting Pt Will Perform Upper Body Bathing: with set-up;with supervision;sitting Pt Will Perform Upper Body Dressing: with set-up;sitting;with supervision Pt Will Perform Lower Body Dressing: with min assist;sit to/from stand Pt/caregiver will Perform Home Exercise Program: Increased strength;Both right and left upper extremity;With written HEP provided;Increased ROM;Independently Additional ADL Goal #1: Pt will maintain sitting balance EOB with supervision during ADL task.  Plan Discharge plan needs to be updated    Co-evaluation                 AM-PAC OT "6 Clicks" Daily Activity     Outcome Measure   Help from another person eating meals?: None Help from another person taking care of personal grooming?: A Little Help from another person toileting, which includes using toliet, bedpan, or urinal?: Total Help from another person bathing (including washing, rinsing, drying)?: A Lot Help from another person to put on and taking off regular upper body clothing?: A Little Help from another person to put on and taking off regular lower body clothing?: A Lot 6 Click Score: 15    End of Session Equipment Utilized During Treatment: Gait belt;Rolling walker  OT Visit Diagnosis: Unsteadiness on feet (R26.81);Muscle weakness (generalized) (M62.81)   Activity Tolerance Patient tolerated treatment  well   Patient Left in bed;with call bell/phone within reach;with chair alarm set;with nursing/sitter in room   Nurse Communication Mobility status        Time: 2409-7353 OT Time Calculation (min): 27 min  Charges: OT General Charges $OT Visit: 1 Visit OT Treatments $Self Care/Home Management : 8-22 mins $Therapeutic Activity: 8-22 mins  Darryl Nestle) Marsa Aris OTR/L Acute Rehabilitation Services Pager: [phone] Office: [place] 02/05/2019, 4:17 PM

## 2019-02-05 NOTE — Progress Notes (Addendum)
Physical Therapy Treatment Patient Details Name: Frederick SizerRobert Moody MRN: [id number] DOB: [birth year] Today's Date: 02/05/2019    History of Present Illness Pt is a 63 y.o. male admitted 01/07/19 with weakness and falls. Worked up for DKA, sepsis with MSSA bacteremia and abscess on back; s/p I&D 7/21. Hospital course complicated by AMS with ETOH withdrawl, possible benzo OD and aspiration; ETT 7/19-7/24, reintubated 7/27-7/29. Pt with continued RLE edema; CT showed large fluid collection; s/p aspiration of R calf on 8/5 with 60cc of fluid removed. PMH includes DM, ETOH abuse.    PT Comments    Pt with improving mobility but still requires hands-on assist for mobility and toe walks on rt due to calf soreness/tightness. Pt also with poor cognition. Pt refusing SNF and is to go home with brother. Pt will need 24 hour assist at home.   Follow Up Recommendations  Home health PT;Supervision/Assistance - 24 hour(Pt refuses SNF)     Equipment Recommendations  None recommended by PT    Recommendations for Other Services       Precautions / Restrictions Precautions Precautions: Fall Restrictions Weight Bearing Restrictions: No    Mobility  Bed Mobility Overal bed mobility: Needs Assistance Bed Mobility: Supine to Sit     Supine to sit: Max assist     General bed mobility comments: Assist to bring legs off of bed and elevate trunk into sitting. Believe assist primarily required due to cognition  Transfers Overall transfer level: Needs assistance Equipment used: Rolling walker (2 wheeled) Transfers: Sit to/from Stand Sit to Stand: Min assist         General transfer comment: Assist to bring hips up and for balance.   Ambulation/Gait Ambulation/Gait assistance: Min assist Gait Distance (Feet): 75 Feet Assistive device: Rolling walker (2 wheeled) Gait Pattern/deviations: Step-to pattern;Decreased step length - left;Decreased stance time - right Gait velocity: decr Gait velocity  interpretation: <1.31 ft/sec, indicative of household ambulator General Gait Details: Assist for balance and support. Pt keeping rt heel off of ground and walking on toes due to soreness/tightness rt calf. Attempted to have pt put foot flat but pt unable.   Stairs             Wheelchair Mobility    Modified Rankin (Stroke Patients Only)       Balance Overall balance assessment: Needs assistance Sitting-balance support: Feet supported Sitting balance-Leahy Scale: Fair     Standing balance support: Bilateral upper extremity supported Standing balance-Leahy Scale: Poor Standing balance comment: walker and min guard for static standing                            Cognition Arousal/Alertness: Awake/alert Behavior During Therapy: Flat affect Overall Cognitive Status: No family/caregiver present to determine baseline cognitive functioning Area of Impairment: Safety/judgement;Awareness;Memory;Following commands;Problem solving                     Memory: Decreased short-term memory Following Commands: Follows one step commands with increased time;Follows one step commands consistently Safety/Judgement: Decreased awareness of safety;Decreased awareness of deficits Awareness: Emergent Problem Solving: Slow processing;Requires verbal cues;Decreased initiation General Comments: Pt with initial difficulty understanding what I was asking of him when I asked him to sit up at the EOB. As session progressed cognition improved.      Exercises      General Comments General comments (skin integrity, edema, etc.): Amb on 4L of O2 and SpO2 >90%.  Pertinent Vitals/Pain Pain Assessment: Faces Faces Pain Scale: Hurts a little bit Pain Location: RLE Pain Descriptors / Indicators: Discomfort Pain Intervention(s): Limited activity within patient's tolerance;Monitored during session    Home Living                      Prior Function            PT  Goals (current goals can now be found in the care plan section) Progress towards PT goals: Progressing toward goals    Frequency    Min 3X/week      PT Plan Discharge plan needs to be updated    Co-evaluation              AM-PAC PT "6 Clicks" Mobility   Outcome Measure  Help needed turning from your back to your side while in a flat bed without using bedrails?: A Little Help needed moving from lying on your back to sitting on the side of a flat bed without using bedrails?: A Lot Help needed moving to and from a bed to a chair (including a wheelchair)?: A Little Help needed standing up from a chair using your arms (e.g., wheelchair or bedside chair)?: A Little Help needed to walk in hospital room?: A Little Help needed climbing 3-5 steps with a railing? : A Lot 6 Click Score: 16    End of Session Equipment Utilized During Treatment: Gait belt Activity Tolerance: Patient tolerated treatment well Patient left: in bed;with call bell/phone within reach;with nursing/sitter in room Nurse Communication: Mobility status(nurse observed amb) PT Visit Diagnosis: Other abnormalities of gait and mobility (R26.89);Pain Pain - Right/Left: Right Pain - part of body: Leg     Time: 5053-9767 PT Time Calculation (min) (ACUTE ONLY): 18 min  Charges:  $Gait Training: 8-22 mins                     [place] Pager [phone] Office [place] 02/05/2019, 1:57 PM

## 2019-02-05 NOTE — Progress Notes (Signed)
Restraints have been removed and nitroglycerin drop has been stopped.  Patient's blood pressure is 96/62.  Night coverage has been notified.

## 2019-02-05 NOTE — Progress Notes (Signed)
   Subjective: Mr. Helt was seen at bedside. Patient was awake and alert. He asked how long he had been in his new room. He has no recollection of the previous three days. He states he feels like he has pneumonia. We explained his medical course over the past three days. He expressed understanding. All questions and concerns were addressed.   Objective:  Vital signs in last 24 hours: Vitals:   02/05/19 0400 02/05/19 0500 02/05/19 0630 02/05/19 0700  BP: 137/90 (!) 154/98  135/80  Pulse: 98 (!) 104  (!) 102  Resp: (!) 31 (!) 25  (!) 36  Temp:    (!) 97.5 F (36.4 C)  TempSrc:    Oral  SpO2: 100% 100%  100%  Weight:   77.5 kg   Height:       Physical Exam Constitutional:      General: He is not in acute distress.    Appearance: He is not ill-appearing, toxic-appearing or diaphoretic.     Comments: Slurred speech noted during the interview.   HENT:     Head: Normocephalic and atraumatic.  Cardiovascular:     Rate and Rhythm: Normal rate and regular rhythm.     Heart sounds: Normal heart sounds. No murmur. No friction rub. No gallop.   Pulmonary:     Breath sounds: Wheezing present.     Comments: Bibasilar crackles auscultated bilaterally.  Abdominal:     General: Abdomen is flat. Bowel sounds are normal.  Neurological:     Mental Status: He is alert.     Assessment/Plan:  Principal Problem:   MSSA bacteremia Active Problems:   Malnutrition of moderate degree   Pressure injury of skin   Palliative care encounter   Critical illness myopathy   Abscess  MSSA Bacteremia 2/2 Soft Tissue Abscess Myositis and LUE Cellulitis: - Afibrile - Continue Ancef through 02/24/2019 -Wound dressing currently clean with nosigns of pus or drainage.  Right Lower Extremity Edema - Wound culturesgrowing Staph Aureus. - Susceptibilities:  - Resistant: Ciprofloxacin - Susceptible: Clndamycin, Erythromcyin, Gentamicin, Inducible clindamycin, oxacillin,  rifampin, tetracycline, trimeth/sulfa, and vancomycin.  -Appreciate ortho evaluation and recs.  Altered Mental Status:  - Xray chest: Bilateral pleural effusions and diffuse interstitial densities. Large layering left pleural effusion.  - Thought to be due dyspnea and hypoxia 2/2 to pulmonary edema. - Ativan PRN  Pulmonary Edema: - Discontinue Furosemide 40 mg  - Continue Amlodipine 5 mg  - Nitro 50 mg in dextrose 5% drip - Consulted IR for a bedside thoracentesis.  - 500 mL bolus NaCl  DM  - CBG goal: <180 - Continue sliding scale insulin -Levemir16 units  SVT:  - Continue to monitor  - No LE VTE per duplex 01/23/2019  Anemia of Critical Illness - Continue to monitor  Hypertension:  -ContinueLisinopril to 40 mg QD - Labetalol 5 mg PRN   Hypomagnesia: - Mg: 1.4 - Magnesium sulfate IV 6 mg  Acute Hypoxic Respiratory Failure Steno PNA. Resolved Encephalopathy/EtOH Withdrawal. Resolved Dysphagia. Resolved  Dispo: Anticipated discharge pending medical course.    [name], MD 02/05/2019, 8:21 AM Pager: [phone]

## 2019-02-05 NOTE — Progress Notes (Signed)
Pharmacy Antibiotic Note  Frederick Moody is a 63 y.o. male admitted on 01/07/2019 with MSSA bacteremia/abscess.  Pharmacy has been consulted for Zosyn dosing for worsening clinical status/CXR, concern for aspiration PNA. Has been on Ancef for the MSSA, will DC that for now, can re-start once Zosyn stopped.   Plan: Zosyn 3.375G IV q8h to be infused over 4 hours Trend WBC, temp, renal function F/U re-start Ancef once Zosyn stopped  Height: 5\' 9"  (175.3 cm) Weight: 179 lb 7.3 oz (81.4 kg) IBW/kg (Calculated) : 70.7  Temp (24hrs), Avg:97.6 F (36.4 C), Min:97 F (36.1 C), Max:97.8 F (36.6 C)  Recent Labs  Lab 01/29/19 0346  02/01/19 0800 02/02/19 0335 02/03/19 0459 02/03/19 1243 02/03/19 2000 02/04/19 0500  WBC 6.3  --   --   --  6.7  --   --   --   CREATININE 0.75   < > 0.77 0.75 0.73  --  0.92 0.86  LATICACIDVEN  --   --   --   --   --  1.4  --   --    < > = values in this interval not displayed.    Estimated Creatinine Clearance: 87.9 mL/min (by C-G formula based on SCr of 0.86 mg/dL).    No Known Allergies   Narda Bonds 02/05/2019 3:10 AM

## 2019-02-05 NOTE — Plan of Care (Signed)
  Problem: Education: Goal: Knowledge of General Education information will improve Description: Including pain rating scale, medication(s)/side effects and non-pharmacologic comfort measures Outcome: Not Progressing   Problem: Health Behavior/Discharge Planning: Goal: Ability to manage health-related needs will improve Outcome: Not Progressing   Problem: Clinical Measurements: Goal: Respiratory complications will improve Outcome: Not Progressing Goal: Cardiovascular complication will be avoided Outcome: Not Progressing   Problem: Nutrition: Goal: Adequate nutrition will be maintained Outcome: Not Progressing   Problem: Coping: Goal: Level of anxiety will decrease Outcome: Not Progressing   

## 2019-02-06 ENCOUNTER — Inpatient Hospital Stay (HOSPITAL_COMMUNITY): Payer: Medicaid Other

## 2019-02-06 HISTORY — PX: IR THORACENTESIS ASP PLEURAL SPACE W/IMG GUIDE: IMG5380

## 2019-02-06 LAB — BODY FLUID CELL COUNT WITH DIFFERENTIAL
Eos, Fluid: 0 %
Lymphs, Fluid: 44 %
Monocyte-Macrophage-Serous Fluid: 27 % — ABNORMAL LOW (ref 50–90)
Neutrophil Count, Fluid: 29 % — ABNORMAL HIGH (ref 0–25)
Total Nucleated Cell Count, Fluid: 215 cu mm (ref 0–1000)

## 2019-02-06 LAB — GLUCOSE, CAPILLARY
Glucose-Capillary: 202 mg/dL — ABNORMAL HIGH (ref 70–99)
Glucose-Capillary: 219 mg/dL — ABNORMAL HIGH (ref 70–99)
Glucose-Capillary: 186 mg/dL — ABNORMAL HIGH (ref 70–99)
Glucose-Capillary: 275 mg/dL — ABNORMAL HIGH (ref 70–99)

## 2019-02-06 LAB — LACTATE DEHYDROGENASE, PLEURAL OR PERITONEAL FLUID: LD, Fluid: 75 U/L — ABNORMAL HIGH (ref 3–23)

## 2019-02-06 LAB — GRAM STAIN

## 2019-02-06 LAB — PROTEIN, PLEURAL OR PERITONEAL FLUID: Total protein, fluid: <3 g/dL

## 2019-02-06 LAB — GLUCOSE, PLEURAL OR PERITONEAL FLUID: Glucose, Fluid: 239 mg/dL

## 2019-02-06 MED ORDER — LIDOCAINE HCL 1 % IJ SOLN
INTRAMUSCULAR | Status: AC
  Filled 2019-02-06: qty 20

## 2019-02-06 MED ORDER — LIDOCAINE HCL (PF) 1 % IJ SOLN
INTRAMUSCULAR | Status: DC | PRN
  Administered 2019-02-06: 10 mL

## 2019-02-06 NOTE — Plan of Care (Signed)
  Problem: Clinical Measurements: Goal: Respiratory complications will improve Outcome: Progressing   Problem: Activity: Goal: Risk for activity intolerance will decrease Outcome: Progressing   Problem: Nutrition: Goal: Adequate nutrition will be maintained Outcome: Progressing   Problem: Coping: Goal: Level of anxiety will decrease Outcome: Progressing   Problem: Education: Goal: Knowledge of General Education information will improve Description: Including pain rating scale, medication(s)/side effects and non-pharmacologic comfort measures Outcome: Not Progressing

## 2019-02-06 NOTE — Progress Notes (Signed)
   Subjective: Frederick Moody was seen at bedside. He is feeling well today. He is not having any issues with breathing. He did not sleep well last night. We discussed the plan for a thoracentesis today. He is reluctant and would like to move around. After further discussion he is in agreement with the thoracentesis.   Of note, throughout our conversation his oxygen was removed and he maintained an SaO2 of >95%. We are hopeful that after the thoracentesis he will be stable for discharge within the next 1-2 days. He voices understanding. All questions and concerns were addressed.    Objective:  Vital signs in last 24 hours: Vitals:   02/06/19 0757 02/06/19 0950 02/06/19 1147 02/06/19 1242  BP: (!) 149/94 (!) 147/87 (!) 146/86 139/87  Pulse: (!) 107   (!) 104  Resp: (!) 24   (!) 35  Temp: 98.5 F (36.9 C)   98.9 F (37.2 C)  TempSrc: Oral   Oral  SpO2: 98%   98%  Weight:      Height:       Physical Exam Vitals signs and nursing note reviewed.  Constitutional:      General: He is not in acute distress.    Appearance: He is not ill-appearing, toxic-appearing or diaphoretic.     Comments: Patient was AAOx3, his speech has improved since yesterday.   HENT:     Head: Normocephalic and atraumatic.  Cardiovascular:     Rate and Rhythm: Normal rate and regular rhythm.     Heart sounds: Normal heart sounds. No murmur. No friction rub. No gallop.   Pulmonary:     Effort: Pulmonary effort is normal.     Breath sounds: No stridor. No wheezing.     Comments: Bibasilar crackles auscultated on the left side. His pulmonary examination has improved since yesterday.  Chest:     Chest wall: No tenderness.  Neurological:     Mental Status: He is alert.     Assessment/Plan:  Principal Problem:   MSSA bacteremia Active Problems:   Malnutrition of moderate degree   Pressure injury of skin   Palliative care encounter   Critical illness myopathy   Abscess  MSSA Bacteremia 2/2 Soft Tissue  Abscess Myositis and LUE Cellulitis: - Afibrile - Continue Zosyn - Total antibiotic course through 02/24/2019 -Wound dressing currently clean with nosigns of pus or drainage.  Right Lower Extremity Edema - Wound culturesgrowing Staph Aureus. - Susceptibilities:  - Resistant: Ciprofloxacin - Susceptible: Clndamycin, Erythromcyin, Gentamicin, Inducible clindamycin, oxacillin, rifampin, tetracycline, trimeth/sulfa, and vancomycin.   Pulmonary Edema:  - Continue Amlodipine 5 mg  - Thoracentesis: Pulled 1.4L of straw colored fluid. Results pending.  - Ordered R thoracentesis for tomorrow. - We appreciate IR's assistance and recommendations.   DM  - CBG goal: <180 - Continue sliding scale insulin -Levemir16 units  Anemia of Critical Illness - Continue to monitor  Hypertension:  -ContinueLisinopril to 40 mg QD - Labetalol 5 mg PRN   Hypomagnesia: - Mg: 1.4 (02/05/2019) - 02/06/2019 BMP pending  SVT:  - Continue to monitor  - No LE VTE per duplex 01/23/2019  Altered Mental Status: Resolved Acute Hypoxic Respiratory Failure Steno PNA: Resolved Encephalopathy/EtOH Withdrawal: Resolved Dysphagia: Resolved  Dispo: Anticipated discharge pending medical course.    [name], MD 02/06/2019, 1:02 PM Pager: [phone]

## 2019-02-06 NOTE — Procedures (Signed)
PROCEDURE SUMMARY:  Successful US guided left thoracentesis. Yielded 1.4 L of clear yellow fluid. Patient tolerated procedure well. No immediate complications. EBL = trace  Specimen was sent for labs.  Post procedure chest X-ray reveals no pneumothorax  *NOTE* There is also a large effusion on the right. Recommend Right Thoracentesis tomorrow.  Tamiya Colello S Marcia Hartwell PA-C 02/06/2019 12:18 PM

## 2019-02-06 NOTE — Progress Notes (Signed)
Patient with MEWS scores always showing either yellow or red, X Blount, NP was notified of MEWS scores earlier, patient's vitals have been unchanged throughout shift, per Blount, NP we do not have to notify her every time of a yellow or red MEWS score unless the patient is unstable or there is significant changes. 

## 2019-02-06 NOTE — Progress Notes (Signed)
Physical Therapy Treatment Patient Details Name: Frederick SizerRobert Moody MRN: [id number] DOB: [birth year] Today's Date: 02/06/2019    History of Present Illness Pt is a 63 y.o. male admitted 01/07/19 with weakness and falls. Worked up for DKA, sepsis with MSSA bacteremia and abscess on back; s/p I&D 7/21. Hospital course complicated by AMS with ETOH withdrawl, possible benzo OD and aspiration; ETT 7/19-7/24, reintubated 7/27-7/29. Pt with continued RLE edema; CT showed large fluid collection; s/p aspiration of R calf on 8/5 with 60cc of fluid removed. PMH includes DM, ETOH abuse.    PT Comments    Pt progressing with mobility but continues to toe walk on rt. Pt continues to demonstrate cognitive deficits and will need 24 hour supervision at DC.    Follow Up Recommendations  Home health PT;Supervision/Assistance - 24 hour(Pt refuses SNF)     Equipment Recommendations  None recommended by PT    Recommendations for Other Services       Precautions / Restrictions Precautions Precautions: Fall Restrictions Weight Bearing Restrictions: No    Mobility  Bed Mobility Overal bed mobility: Needs Assistance Bed Mobility: Supine to Sit     Supine to sit: Supervision     General bed mobility comments: Incr time  Transfers Overall transfer level: Needs assistance Equipment used: Rolling walker (2 wheeled) Transfers: Sit to/from Stand Sit to Stand: Min guard         General transfer comment: Assist for safety. Verbal cues for hand placement  Ambulation/Gait Ambulation/Gait assistance: Supervision Gait Distance (Feet): 125 Feet Assistive device: Rolling walker (2 wheeled) Gait Pattern/deviations: Step-to pattern;Decreased step length - left;Decreased stance time - right(Pt walking on rt forefoot and not getting foot flat) Gait velocity: decr Gait velocity interpretation: <1.31 ft/sec, indicative of household ambulator General Gait Details: Assist for safety. Verbal cues to try to get  rt foot flat. Encouraged pt to work on stretching rt calf. Doubt pt will remember to follow through   Stairs             Wheelchair Mobility    Modified Rankin (Stroke Patients Only)       Balance Overall balance assessment: Needs assistance Sitting-balance support: Feet supported Sitting balance-Leahy Scale: Fair     Standing balance support: Bilateral upper extremity supported Standing balance-Leahy Scale: Poor Standing balance comment: walker and supervision for static standing                            Cognition Arousal/Alertness: Awake/alert Behavior During Therapy: Flat affect Overall Cognitive Status: No family/caregiver present to determine baseline cognitive functioning Area of Impairment: Safety/judgement;Awareness;Memory;Following commands;Problem solving;Attention                   Current Attention Level: Sustained Memory: Decreased short-term memory Following Commands: Follows one step commands consistently Safety/Judgement: Decreased awareness of safety;Decreased awareness of deficits Awareness: Emergent Problem Solving: Requires verbal cues General Comments: Pt perseverating on wanting to take a shower. Pt making random confused statements      Exercises      General Comments General comments (skin integrity, edema, etc.): Pt on RA with SpO2 >90%      Pertinent Vitals/Pain Pain Assessment: Faces Faces Pain Scale: Hurts a little bit Pain Location: RLE Pain Descriptors / Indicators: Tightness Pain Intervention(s): Limited activity within patient's tolerance;Monitored during session;Repositioned    Home Living  Prior Function            PT Goals (current goals can now be found in the care plan section) Progress towards PT goals: Progressing toward goals    Frequency    Min 3X/week      PT Plan Current plan remains appropriate    Co-evaluation              AM-PAC PT "6  Clicks" Mobility   Outcome Measure  Help needed turning from your back to your side while in a flat bed without using bedrails?: None Help needed moving from lying on your back to sitting on the side of a flat bed without using bedrails?: A Little Help needed moving to and from a bed to a chair (including a wheelchair)?: A Little Help needed standing up from a chair using your arms (e.g., wheelchair or bedside chair)?: A Little Help needed to walk in hospital room?: A Little Help needed climbing 3-5 steps with a railing? : A Lot 6 Click Score: 18    End of Session   Activity Tolerance: Patient tolerated treatment well Patient left: with call bell/phone within reach;in chair;with chair alarm set Nurse Communication: Mobility status(nurse tech) PT Visit Diagnosis: Other abnormalities of gait and mobility (R26.89);Pain Pain - Right/Left: Right Pain - part of body: Leg     Time: 8127-5170 PT Time Calculation (min) (ACUTE ONLY): 18 min  Charges:  $Gait Training: 8-22 mins                     [place] Pager [phone] Office [place] 02/06/2019, 4:00 PM

## 2019-02-07 ENCOUNTER — Inpatient Hospital Stay (HOSPITAL_COMMUNITY): Payer: Medicaid Other

## 2019-02-07 ENCOUNTER — Encounter (HOSPITAL_COMMUNITY): Payer: Self-pay | Admitting: Physician Assistant

## 2019-02-07 DIAGNOSIS — L02414 Cutaneous abscess of left upper limb: Secondary | ICD-10-CM

## 2019-02-07 DIAGNOSIS — I471 Supraventricular tachycardia: Secondary | ICD-10-CM

## 2019-02-07 DIAGNOSIS — D638 Anemia in other chronic diseases classified elsewhere: Secondary | ICD-10-CM

## 2019-02-07 DIAGNOSIS — I10 Essential (primary) hypertension: Secondary | ICD-10-CM

## 2019-02-07 DIAGNOSIS — R600 Localized edema: Secondary | ICD-10-CM

## 2019-02-07 DIAGNOSIS — Z1629 Resistance to other single specified antibiotic: Secondary | ICD-10-CM

## 2019-02-07 DIAGNOSIS — E119 Type 2 diabetes mellitus without complications: Secondary | ICD-10-CM

## 2019-02-07 DIAGNOSIS — J811 Chronic pulmonary edema: Secondary | ICD-10-CM

## 2019-02-07 LAB — GRAM STAIN

## 2019-02-07 LAB — BASIC METABOLIC PANEL
Anion gap: 8 (ref 5–15)
BUN: 25 mg/dL — ABNORMAL HIGH (ref 8–23)
CO2: 25 mmol/L (ref 22–32)
Calcium: 8 mg/dL — ABNORMAL LOW (ref 8.9–10.3)
Chloride: 102 mmol/L (ref 98–111)
Creatinine, Ser: 0.86 mg/dL (ref 0.61–1.24)
GFR calc Af Amer: >60 mL/min (ref >60)
GFR calc non Af Amer: >60 mL/min (ref >60)
Glucose, Bld: 259 mg/dL — ABNORMAL HIGH (ref 70–99)
Potassium: 3.9 mmol/L (ref 3.5–5.1)
Sodium: 135 mmol/L (ref 135–145)

## 2019-02-07 LAB — PROTEIN, PLEURAL OR PERITONEAL FLUID: Total protein, fluid: <3 g/dL

## 2019-02-07 LAB — BODY FLUID CELL COUNT WITH DIFFERENTIAL
Eos, Fluid: 1 %
Lymphs, Fluid: 40 %
Monocyte-Macrophage-Serous Fluid: 13 % — ABNORMAL LOW (ref 50–90)
Neutrophil Count, Fluid: 46 % — ABNORMAL HIGH (ref 0–25)
Total Nucleated Cell Count, Fluid: 171 cu mm (ref 0–1000)

## 2019-02-07 LAB — GLUCOSE, PLEURAL OR PERITONEAL FLUID: Glucose, Fluid: 230 mg/dL

## 2019-02-07 LAB — LACTATE DEHYDROGENASE, PLEURAL OR PERITONEAL FLUID: LD, Fluid: 80 U/L — ABNORMAL HIGH (ref 3–23)

## 2019-02-07 LAB — GLUCOSE, CAPILLARY
Glucose-Capillary: 185 mg/dL — ABNORMAL HIGH (ref 70–99)
Glucose-Capillary: 193 mg/dL — ABNORMAL HIGH (ref 70–99)
Glucose-Capillary: 243 mg/dL — ABNORMAL HIGH (ref 70–99)
Glucose-Capillary: 125 mg/dL — ABNORMAL HIGH (ref 70–99)

## 2019-02-07 LAB — MAGNESIUM: Magnesium: 1.5 mg/dL — ABNORMAL LOW (ref 1.7–2.4)

## 2019-02-07 MED ORDER — BENZONATATE 100 MG PO CAPS
100.0000 mg | ORAL_CAPSULE | Freq: Two times a day (BID) | ORAL | Status: DC
  Administered 2019-02-07 – 2019-02-09 (×5): 100 mg via ORAL
  Filled 2019-02-07 (×5): qty 1

## 2019-02-07 MED ORDER — FUROSEMIDE 10 MG/ML PO SOLN
40.0000 mg | Freq: Every day | ORAL | Status: DC
  Administered 2019-02-07: 40 mg via ORAL
  Filled 2019-02-07: qty 5
  Filled 2019-02-07: qty 4

## 2019-02-07 MED ORDER — CAMPHOR-MENTHOL 0.5-0.5 % EX LOTN
TOPICAL_LOTION | CUTANEOUS | Status: DC | PRN
  Administered 2019-02-07: 1 via TOPICAL
  Filled 2019-02-07: qty 222

## 2019-02-07 MED ORDER — CARVEDILOL 3.125 MG PO TABS
3.1250 mg | ORAL_TABLET | Freq: Two times a day (BID) | ORAL | Status: DC
  Administered 2019-02-07 – 2019-02-09 (×5): 3.125 mg via ORAL
  Filled 2019-02-07 (×5): qty 1

## 2019-02-07 MED ORDER — INSULIN DETEMIR 100 UNIT/ML ~~LOC~~ SOLN
18.0000 [IU] | Freq: Every day | SUBCUTANEOUS | Status: DC
  Administered 2019-02-08 – 2019-02-09 (×2): 18 [IU] via SUBCUTANEOUS
  Filled 2019-02-07 (×2): qty 0.18

## 2019-02-07 MED ORDER — MAGNESIUM SULFATE 2 GM/50ML IV SOLN
2.0000 g | Freq: Once | INTRAVENOUS | Status: AC
  Administered 2019-02-07: 2 g via INTRAVENOUS
  Filled 2019-02-07: qty 50

## 2019-02-07 MED ORDER — LIDOCAINE HCL (PF) 1 % IJ SOLN
INTRAMUSCULAR | Status: AC
  Filled 2019-02-07: qty 30

## 2019-02-07 NOTE — Procedures (Signed)
Ultrasound-guided diagnostic and therapeutic right thoracentesis performed yielding 1.5 liters of yellow fluid. No immediate complications. Follow-up chest x-ray pending.A portion of the fluid was submitted to the lab for preordered studies. EBL none.

## 2019-02-07 NOTE — Progress Notes (Signed)
Occupational Therapy Treatment Patient Details Name: Frederick SizerRobert Moody MRN: [id number] DOB: 1956-02-19 Today's Date: 02/07/2019    History of present illness Pt is a 63 y.o. male admitted 01/07/19 with weakness and falls. Worked up for DKA, sepsis with MSSA bacteremia and abscess on back; s/p I&D 7/21. Hospital course complicated by AMS with ETOH withdrawl, possible benzo OD and aspiration; ETT 7/19-7/24, reintubated 7/27-7/29. Pt with continued RLE edema; CT showed large fluid collection; s/p aspiration of R calf on 8/5 with 60cc of fluid removed. PMH includes DM, ETOH abuse.   OT comments  Pt given pillbox test and was unable to pass requiring cues to fix mistakes in sequencing and multistep medicatins. Pt requiring cues to attend to task as he would ask OTR an unrelated question throughout task disrupting the assessment. Pt reported to have assisted with his mother's medication management at home. Being that pt is having cognitive deficits with multi-step commands, sequencing, short term memory and poor attention, pt alerted that he would need assist for medication management for himself and his mother. Pt stated that there are other family members who can be trusted to do so,but also stated his brothers cannot be trusted with pain medications. Pt transferring OOB with minguardA. Pt continues to benefit from OT. OT following acutely.   Follow Up Recommendations  SNF;Supervision/Assistance - 24 hour;Home health OT(Pt refusing SNF so HHOT required)    Equipment Recommendations  Tub/shower seat    Recommendations for Other Services      Precautions / Restrictions Precautions Precautions: Fall Restrictions Weight Bearing Restrictions: No       Mobility Bed Mobility Overal bed mobility: Needs Assistance Bed Mobility: Supine to Sit     Supine to sit: Supervision     General bed mobility comments: Incr time  Transfers Overall transfer level: Needs assistance   Transfers: Stand  Pivot Transfers   Stand pivot transfers: Min guard       General transfer comment: Assist for safety. Verbal cues for hand placement    Balance Overall balance assessment: Needs assistance Sitting-balance support: Feet supported Sitting balance-Leahy Scale: Fair       Standing balance-Leahy Scale: Poor Standing balance comment: transferring with minguardA from bed to recliner                           ADL either performed or assessed with clinical judgement   ADL Overall ADL's : Needs assistance/impaired                                     Functional mobility during ADLs: Minimal assistance;Rolling walker General ADL Comments: Pt prefers to sit for ADL. Pt transferred to recliner for Pill Box test.     Vision   Vision Assessment?: No apparent visual deficits   Perception     Praxis      Cognition Arousal/Alertness: Awake/alert Behavior During Therapy: Flat affect Overall Cognitive Status: No family/caregiver present to determine baseline cognitive functioning Area of Impairment: Safety/judgement;Awareness;Memory;Following commands;Problem solving;Attention                   Current Attention Level: Sustained Memory: Decreased short-term memory Following Commands: Follows one step commands consistently;Follows multi-step commands with increased time Safety/Judgement: Decreased awareness of deficits Awareness: Emergent Problem Solving: Requires verbal cues General Comments: Pt given pillbox test and was unable to pass requiring cues to fix mistakes in  sequencing and multistep medicatins. Pt requiring cues to attend to task as he would ask OTR an unrelated question throughout task disrupting the assessment.        Exercises     Shoulder Instructions       General Comments Pt did not pass Pillbox test asessment in allotted amount of time and max cues required to fix mistakes. Pt reported to have assisted with his mother's  medication management and pt alerted that he would not be able to do so after he goes home without assist.    Pertinent Vitals/ Pain       Pain Assessment: No/denies pain  Home Living                                          Prior Functioning/Environment              Frequency  Min 2X/week        Progress Toward Goals  OT Goals(current goals can now be found in the care plan section)  Progress towards OT goals: Progressing toward goals  Acute Rehab OT Goals Patient Stated Goal: shower OT Goal Formulation: With patient Time For Goal Achievement: 02/18/19 Potential to Achieve Goals: Good ADL Goals Pt Will Perform Grooming: with supervision;sitting Pt Will Perform Upper Body Bathing: with set-up;with supervision;sitting Pt Will Perform Upper Body Dressing: with set-up;sitting;with supervision Pt Will Perform Lower Body Dressing: with min assist;sit to/from stand Pt/caregiver will Perform Home Exercise Program: Increased strength;Both right and left upper extremity;With written HEP provided;Increased ROM;Independently Additional ADL Goal #1: Pt will maintain sitting balance EOB with supervision during ADL task.  Plan Discharge plan needs to be updated    Co-evaluation                 AM-PAC OT "6 Clicks" Daily Activity     Outcome Measure   Help from another person eating meals?: None Help from another person taking care of personal grooming?: A Little Help from another person toileting, which includes using toliet, bedpan, or urinal?: A Lot Help from another person bathing (including washing, rinsing, drying)?: A Lot Help from another person to put on and taking off regular upper body clothing?: A Little Help from another person to put on and taking off regular lower body clothing?: A Lot 6 Click Score: 16    End of Session Equipment Utilized During Treatment: Gait belt  OT Visit Diagnosis: Unsteadiness on feet (R26.81);Muscle weakness  (generalized) (M62.81)   Activity Tolerance Patient tolerated treatment well   Patient Left in chair;with call bell/phone within reach;with chair alarm set   Nurse Communication Mobility status        Time: 1093-2355 OT Time Calculation (min): 31 min  Charges: OT General Charges $OT Visit: 1 Visit OT Treatments $Self Care/Home Management : 23-37 mins  Frederick Moody) Marsa Aris OTR/L Acute Rehabilitation Services Pager: [phone] Office: [phone]    Frederick Moody 02/07/2019, 3:27 PM

## 2019-02-07 NOTE — Progress Notes (Signed)
   Subjective: Frederick Moody was seen at bedside. He states that he is feeling better than yesterday. He states that he tolerated the results well. He was amendable to a second thoracentesis of his R side as well. All questions and concerns were addressed.   Of note, during the interview his BP was 127/76 and his O2 saturation was at 96.   Objective:  Vital signs in last 24 hours: Vitals:   02/06/19 2152 02/06/19 2358 02/07/19 0350 02/07/19 0750  BP: 137/82 126/79 140/76 138/71  Pulse: (!) 107 (!) 101 (!) 111 (!) 106  Resp: (!) 24 (!) 28 19 16   Temp: 99.8 F (37.7 C) 99.1 F (37.3 C) 98.4 F (36.9 C) 97.9 F (36.6 C)  TempSrc: Oral Oral Tympanic Oral  SpO2: 95% 93% 91% 90%  Weight:      Height:       Physical Exam Vitals signs and nursing note reviewed.  Constitutional:      General: He is not in acute distress.    Appearance: Normal appearance. He is not ill-appearing, toxic-appearing or diaphoretic.     Comments: Patient's speech has come back to baseline.   HENT:     Head: Normocephalic and atraumatic.  Cardiovascular:     Rate and Rhythm: Normal rate and regular rhythm.     Heart sounds: Normal heart sounds. No murmur. No friction rub. No gallop.   Pulmonary:     Effort: Pulmonary effort is normal.     Breath sounds: No stridor. No wheezing.  Chest:     Chest wall: No tenderness.  Abdominal:     General: Abdomen is flat. Bowel sounds are normal.  Neurological:     Mental Status: He is alert and oriented to person, place, and time. Mental status is at baseline.     Assessment/Plan:  Principal Problem:   MSSA bacteremia Active Problems:   Malnutrition of moderate degree   Pressure injury of skin   Palliative care encounter   Critical illness myopathy   Abscess  MSSA Bacteremia 2/2 Soft Tissue Abscess Myositis and LUE Cellulitis: - Afibrile - Continue Zosyn - Total antibiotic course through 02/24/2019 -Wound dressing currently clean with nosigns of pus  or drainage.  Right Lower Extremity Edema - Wound culturesgrowing Staph Aureus. - Susceptibilities:  - Resistant: Ciprofloxacin - Susceptible: Clndamycin, Erythromcyin, Gentamicin, Inducible clindamycin, oxacillin, rifampin, tetracycline, trimeth/sulfa, and vancomycin.   Pulmonary Edema:  - Continue Amlodipine 5 mg  - Thoracentesis: Pulled 1.4L of straw colored fluid.   Results:  - LDH: 75  - Body Fluid Cell Count: Hazy in appearance. Neutrophil count: 29% (H), Monocyte-Macrobphage- Serous Fluid: 27 (L).   - Total protein: < 3  - Gram Stain: WBC, Both PMN and Mononuclear  - Culture, Body fluid-bottle: Pending  - PH, Body fluid: Pending - IR consulted for R thoracentesis. - We appreciate IR's assistance and recommendations.   DM  - CBG goal: <180 - Continue sliding scale insulin -Levemir16 units  Anemia of Critical Illness - Continue to monitor  Hypertension:  -ContinueLisinopril to 40 mg QD - Labetalol 5 mg PRN   Hypomagnesia: - Mg: 1.5 - Magnesium IV 2 mg  SVT:  - Continue to monitor  - No LE VTE per duplex 01/23/2019  Altered Mental Status: Resolved Acute Hypoxic Respiratory Failure Steno PNA: Resolved Encephalopathy/EtOH Withdrawal: Resolved Dysphagia: Resolved  Dispo: Anticipated discharge pending medical course.    [name], MD 02/07/2019, 8:39 AM Pager: [phone]

## 2019-02-08 LAB — GLUCOSE, CAPILLARY
Glucose-Capillary: 166 mg/dL — ABNORMAL HIGH (ref 70–99)
Glucose-Capillary: 190 mg/dL — ABNORMAL HIGH (ref 70–99)
Glucose-Capillary: 217 mg/dL — ABNORMAL HIGH (ref 70–99)
Glucose-Capillary: 162 mg/dL — ABNORMAL HIGH (ref 70–99)

## 2019-02-08 LAB — RENAL FUNCTION PANEL
Albumin: 1.9 g/dL — ABNORMAL LOW (ref 3.5–5.0)
Anion gap: 9 (ref 5–15)
BUN: 26 mg/dL — ABNORMAL HIGH (ref 8–23)
CO2: 24 mmol/L (ref 22–32)
Calcium: 7.8 mg/dL — ABNORMAL LOW (ref 8.9–10.3)
Chloride: 101 mmol/L (ref 98–111)
Creatinine, Ser: 0.83 mg/dL (ref 0.61–1.24)
GFR calc Af Amer: >60 mL/min (ref >60)
GFR calc non Af Amer: >60 mL/min (ref >60)
Glucose, Bld: 184 mg/dL — ABNORMAL HIGH (ref 70–99)
Phosphorus: 3.7 mg/dL (ref 2.5–4.6)
Potassium: 3.8 mmol/L (ref 3.5–5.1)
Sodium: 134 mmol/L — ABNORMAL LOW (ref 135–145)

## 2019-02-08 LAB — MAGNESIUM: Magnesium: 1.6 mg/dL — ABNORMAL LOW (ref 1.7–2.4)

## 2019-02-08 MED ORDER — MAGNESIUM SULFATE 2 GM/50ML IV SOLN
2.0000 g | Freq: Once | INTRAVENOUS | Status: AC
  Administered 2019-02-08: 2 g via INTRAVENOUS
  Filled 2019-02-08: qty 50

## 2019-02-08 MED ORDER — CEFAZOLIN SODIUM-DEXTROSE 2-4 GM/100ML-% IV SOLN
2.0000 g | Freq: Three times a day (TID) | INTRAVENOUS | Status: DC
  Administered 2019-02-08 – 2019-02-09 (×3): 2 g via INTRAVENOUS
  Filled 2019-02-08 (×5): qty 100

## 2019-02-08 MED ORDER — FUROSEMIDE 40 MG PO TABS
40.0000 mg | ORAL_TABLET | Freq: Every day | ORAL | Status: DC
  Administered 2019-02-08 – 2019-02-09 (×2): 40 mg via ORAL
  Filled 2019-02-08 (×2): qty 1

## 2019-02-08 NOTE — Progress Notes (Signed)
   Subjective: Frederick Moody was seen at bedside. He was awake and alert. He states that he is in good spirits. We discussed the results of yesterday's thoracentesis. He asked about when he could possibly go home pending the results of his thoracentesis. All questions and concerns were addressed.   Objective:  Vital signs in last 24 hours: Vitals:   02/08/19 0322 02/08/19 0420 02/08/19 0500 02/08/19 0721  BP: 132/63 (!) 117/2 122/68 133/81  Pulse: 98 70 97 97  Resp: 20 18 20 20   Temp:  98.5 F (36.9 C) 98.8 F (37.1 C) (!) 97.5 F (36.4 C)  TempSrc:  Oral Oral Oral  SpO2: 96% 98% 98% 94%  Weight:  79.4 kg    Height:       Physical Exam Vitals signs and nursing note reviewed.  Constitutional:      General: He is not in acute distress.    Appearance: Normal appearance. He is not ill-appearing, toxic-appearing or diaphoretic.  HENT:     Head: Normocephalic and atraumatic.  Cardiovascular:     Rate and Rhythm: Normal rate and regular rhythm.     Heart sounds: Normal heart sounds. No murmur. No friction rub. No gallop.   Pulmonary:     Effort: Pulmonary effort is normal.     Breath sounds: No stridor. No wheezing.  Chest:     Chest wall: No tenderness.  Abdominal:     General: Abdomen is flat. Bowel sounds are normal.  Neurological:     Mental Status: He is alert and oriented to person, place, and time. Mental status is at baseline.     Assessment/Plan:  Principal Problem:   MSSA bacteremia Active Problems:   Malnutrition of moderate degree   Pressure injury of skin   Palliative care encounter   Critical illness myopathy   Abscess  MSSA Bacteremia 2/2 Soft Tissue Abscess Myositis and LUE Cellulitis: - Afibrile - Continue Zosyn - Total antibiotic course through 02/24/2019 -Wound dressing currently clean with nosigns of pus or drainage.  Right Lower Extremity Edema - Wound culturesgrowing Staph Aureus. - Susceptibilities:  - Resistant:  Ciprofloxacin - Susceptible: Clndamycin, Erythromcyin, Gentamicin, Inducible clindamycin, oxacillin, rifampin, tetracycline, trimeth/sulfa, and vancomycin.   Pulmonary Edema:  - Continue Amlodipine 5 mg  - Continue Lasix 40 mg - Thoracentesis: Pulled 1.5L of yellow fluid.   Results:  - LDH: 80  - Body Fluid Cell Count: Hazy in appearance. Neutrophil count: 46% (H), Monocyte-Macrobphage- Serous Fluid: 13 (L).   - Total protein: < 3  - Gram Stain: WBC, Both PMN and Mononuclear  - Culture, Body fluid-bottle: Pending  - PH, Body fluid: Pending - We appreciate IR for helping [place] care for Frederick Moody.   DM  - CBG goal: <180 - Continue sliding scale insulin -Levemir18 units  Anemia of Critical Illness - Continue to monitor  Hypertension:  -ContinueLisinopril to 40 mg QD - Labetalol 5 mg PRN - Continue Coreg 3.125 mh - Hypomagnesia: - Mg: 1.6 - Ordered Magnesium IV 2 mg  SVT:  - Continue to monitor  - No LE VTE per duplex 01/23/2019  Altered Mental Status: Resolved Acute Hypoxic Respiratory Failure Steno PNA: Resolved Encephalopathy/EtOH Withdrawal: Resolved Dysphagia: Resolved  Dispo: Anticipated discharge pending medical course.    [name], MD 02/08/2019, 10:32 AM Pager: [phone]

## 2019-02-08 NOTE — Progress Notes (Addendum)
Pharmacy Antibiotic Note  Frederick Moody is a 63 y.o. male admitted on 01/07/2019 with MSSA bacteremia/abscess.  Pharmacy has been consulted for Zosyn dosing for worsening clinical status/CXR, concern for aspiration PNA. Has been on Ancef for the MSSA previously, once PNA clears up; safe to de-escalate to Ancef.    Plan: Continue Zosyn 3.375G IV q8h to be infused over 4 hours Trend WBC, temp, renal function F/U re-start Ancef once Zosyn stopped  Height: 5\' 9"  (175.3 cm) Weight: 175 lb 0.7 oz (79.4 kg) IBW/kg (Calculated) : 70.7  Temp (24hrs), Avg:98.3 F (36.8 C), Min:97.5 F (36.4 C), Max:98.8 F (37.1 C)  Recent Labs  Lab 02/03/19 0459 02/03/19 1243 02/03/19 2000 02/04/19 0500 02/05/19 0346 02/07/19 0836 02/08/19 0530  WBC 6.7  --   --   --  5.5  --   --   CREATININE 0.73  --  0.92 0.86 0.84 0.86 0.83  LATICACIDVEN  --  1.4  --   --   --   --   --     Estimated Creatinine Clearance: 91.1 mL/min (by C-G formula based on SCr of 0.83 mg/dL).    No Known Allergies   Thank you for the interesting consult and for involving pharmacy in this patient's care.  Tamela Gammon, PharmD 02/08/2019 9:16 AM PGY-1 Pharmacy Resident Direct Phone: [phone] Please check [email or web address] for unit-specific pharmacist phone numbers  ---------------------------------------------- ADDENDUM: Pharmacy re-consulted to restart ancef and stop zosyn   Plan:  Stop Zosyn Start Cefazolin 2gm every 8 hours Trend WBC, temp, renal function  Tamela Gammon, PharmD 02/08/19 2:24 PM

## 2019-02-09 DIAGNOSIS — J810 Acute pulmonary edema: Secondary | ICD-10-CM

## 2019-02-09 LAB — BASIC METABOLIC PANEL
Anion gap: 7 (ref 5–15)
BUN: 25 mg/dL — ABNORMAL HIGH (ref 8–23)
CO2: 24 mmol/L (ref 22–32)
Calcium: 7.8 mg/dL — ABNORMAL LOW (ref 8.9–10.3)
Chloride: 105 mmol/L (ref 98–111)
Creatinine, Ser: 0.83 mg/dL (ref 0.61–1.24)
GFR calc Af Amer: >60 mL/min (ref >60)
GFR calc non Af Amer: >60 mL/min (ref >60)
Glucose, Bld: 206 mg/dL — ABNORMAL HIGH (ref 70–99)
Potassium: 3.8 mmol/L (ref 3.5–5.1)
Sodium: 136 mmol/L (ref 135–145)

## 2019-02-09 LAB — PH, BODY FLUID
pH, Body Fluid: 7.7
pH, Body Fluid: 7.5

## 2019-02-09 LAB — GLUCOSE, CAPILLARY
Glucose-Capillary: 192 mg/dL — ABNORMAL HIGH (ref 70–99)
Glucose-Capillary: 258 mg/dL — ABNORMAL HIGH (ref 70–99)
Glucose-Capillary: 113 mg/dL — ABNORMAL HIGH (ref 70–99)

## 2019-02-09 LAB — PATHOLOGIST SMEAR REVIEW

## 2019-02-09 MED ORDER — CARVEDILOL 3.125 MG PO TABS: 3.1250 mg | ORAL_TABLET | Freq: Two times a day (BID) | ORAL | 0 refills | Status: DC

## 2019-02-09 MED ORDER — INSULIN STARTER KIT- PEN NEEDLES (ENGLISH)
1.0000 | Freq: Once | Status: AC
  Administered 2019-02-09: 1
  Filled 2019-02-09: qty 1

## 2019-02-09 MED ORDER — INSULIN DETEMIR 100 UNIT/ML FLEXPEN: 20.0000 [IU] | PEN_INJECTOR | Freq: Every day | SUBCUTANEOUS | 0 refills | Status: DC

## 2019-02-09 MED ORDER — CEFAZOLIN IV (FOR PTA / DISCHARGE USE ONLY): 2.0000 g | Freq: Three times a day (TID) | INTRAVENOUS | 0 refills | Status: AC

## 2019-02-09 MED ORDER — LISINOPRIL 40 MG PO TABS: 40.0000 mg | ORAL_TABLET | Freq: Every day | ORAL | 0 refills | Status: DC

## 2019-02-09 MED ORDER — AMLODIPINE BESYLATE 5 MG PO TABS: 5.0000 mg | ORAL_TABLET | Freq: Every day | ORAL | 0 refills | Status: DC

## 2019-02-09 MED ORDER — FUROSEMIDE 40 MG PO TABS: 40.0000 mg | ORAL_TABLET | Freq: Every day | ORAL | 0 refills | Status: DC

## 2019-02-09 MED ORDER — PEN NEEDLES 32G X 4 MM MISC: 1.0000 [IU] | Freq: Every day | 0 refills | Status: DC

## 2019-02-09 MED FILL — PENTIPS 32G X 4 MM MISC: 32G X 4 MM | 30 days supply | Qty: 100 | Fill #0

## 2019-02-09 MED FILL — FUROSEMIDE 40 MG TABLET: 40 | 30 days supply | Qty: 30 | Fill #0

## 2019-02-09 MED FILL — CARVEDILOL 3.125 MG TABLET: 3.125 | 30 days supply | Qty: 60 | Fill #0

## 2019-02-09 MED FILL — LISINOPRIL 40 MG TABS: 40 | 30 days supply | Qty: 30 | Fill #0

## 2019-02-09 MED FILL — AMLODIPINE BESYLATE 5 MG TA: 5 | 30 days supply | Qty: 30 | Fill #0

## 2019-02-09 MED FILL — LEVEMIR FLEXTOUCH 100 UNITS: 100 | 30 days supply | Qty: 6 | Fill #0

## 2019-02-09 NOTE — Progress Notes (Signed)
Physical Therapy Treatment Patient Details Name: Frederick Moody MRN: [id number] DOB: 03/27/1956 Today's Date: 02/09/2019    History of Present Illness Pt is a 63 y.o. male admitted 01/07/19 with weakness and falls. Worked up for DKA, sepsis with MSSA bacteremia and abscess on back; s/p I&D 7/21. Hospital course complicated by AMS with ETOH withdrawl, possible benzo OD and aspiration; ETT 7/19-7/24, reintubated 7/27-7/29. Pt with continued RLE edema; CT showed large fluid collection; s/p aspiration of R calf on 8/5 with 60cc of fluid removed. PMH includes DM, ETOH abuse.    PT Comments    Pt making steady progress. Continues to have confusion and will require 24 hour supervision/assist at home as well as HHPT.    Follow Up Recommendations  Home health PT;Supervision/Assistance - 24 hour(Pt refuses SNF)     Equipment Recommendations  None recommended by PT(has walker at home per case management note 8/10. )    Recommendations for Other Services       Precautions / Restrictions Precautions Precautions: Fall Restrictions Weight Bearing Restrictions: No    Mobility  Bed Mobility               General bed mobility comments: Pt up in chair  Transfers Overall transfer level: Needs assistance Equipment used: Rolling walker (2 wheeled) Transfers: Sit to/from Stand Sit to Stand: Supervision         General transfer comment: supervision for safety  Ambulation/Gait Ambulation/Gait assistance: Supervision Gait Distance (Feet): 170 Feet Assistive device: Rolling walker (2 wheeled) Gait Pattern/deviations: Decreased step length - left;Decreased stance time - right;Step-through pattern(toe walking on rt) Gait velocity: decr Gait velocity interpretation: 1.31 - 2.62 ft/sec, indicative of limited community ambulator General Gait Details: supervision for safety. Verbal cues to continue to try and put heel down with stance   Stairs             Wheelchair Mobility     Modified Rankin (Stroke Patients Only)       Balance Overall balance assessment: Needs assistance Sitting-balance support: Feet supported Sitting balance-Leahy Scale: Fair     Standing balance support: Bilateral upper extremity supported Standing balance-Leahy Scale: Poor Standing balance comment: walker and supervision for static standing                            Cognition Arousal/Alertness: Awake/alert Behavior During Therapy: Flat affect Overall Cognitive Status: No family/caregiver present to determine baseline cognitive functioning Area of Impairment: Safety/judgement;Awareness;Memory;Following commands;Problem solving                     Memory: Decreased short-term memory Following Commands: Follows one step commands consistently;Follows multi-step commands with increased time Safety/Judgement: Decreased awareness of safety;Decreased awareness of deficits Awareness: Emergent Problem Solving: Requires verbal cues General Comments: Pt telling me he got down on the floor early this morning to crawl to the bathroom.       Exercises      General Comments        Pertinent Vitals/Pain Pain Assessment: Faces Faces Pain Scale: Hurts a little bit Pain Location: RLE Pain Descriptors / Indicators: Tightness Pain Intervention(s): Limited activity within patient's tolerance;Monitored during session    Home Living                      Prior Function            PT Goals (current goals can now be found in the care  plan section) Progress towards PT goals: Progressing toward goals    Frequency    Min 3X/week      PT Plan Discharge plan needs to be updated    Co-evaluation              AM-PAC PT "6 Clicks" Mobility   Outcome Measure  Help needed turning from your back to your side while in a flat bed without using bedrails?: None Help needed moving from lying on your back to sitting on the side of a flat bed without using  bedrails?: None Help needed moving to and from a bed to a chair (including a wheelchair)?: None Help needed standing up from a chair using your arms (e.g., wheelchair or bedside chair)?: None Help needed to walk in hospital room?: A Little Help needed climbing 3-5 steps with a railing? : A Little 6 Click Score: 22    End of Session   Activity Tolerance: Patient tolerated treatment well Patient left: with call bell/phone within reach;in chair;with chair alarm set Nurse Communication: Mobility status PT Visit Diagnosis: Other abnormalities of gait and mobility (R26.89);Pain Pain - Right/Left: Right Pain - part of body: Leg     Time: 1145-1156 PT Time Calculation (min) (ACUTE ONLY): 11 min  Charges:  $Gait Training: 8-22 mins                     [facility] At Fort WorthCary Lavaughn Haberle PT Acute Rehabilitation Services Pager [phone] Office [phone]    Angelina OkCary [address] [facility] [email or web address] 02/09/2019, 12:17 PM

## 2019-02-09 NOTE — Progress Notes (Signed)
.     Subjective: Frederick Moody was seen at bedside. He states that he was doing well yesterday. He states that "I did not have a fall last night." He states that he was trying to going to the bathroom last night when the bedside alarm went off. We discussed the results of his pleural effusion. He was relieved to hear his results. All questions and concerns were addressed.  Objective:  Vital signs in last 24 hours: Vitals:   02/08/19 1912 02/08/19 2300 02/09/19 0100 02/09/19 0300  BP: 119/85 125/67 126/73 114/69  Pulse: 97 93 93 94  Resp: 16 19 20 17   Temp: 99.6 F (37.6 C) 98.5 F (36.9 C)  98.9 F (37.2 C)  TempSrc: Oral Oral  Oral  SpO2: 100% 100% 95% 98%  Weight:    79.8 kg  Height:       Physical Exam Vitals signs and nursing note reviewed.  Constitutional:      General: He is not in acute distress.    Appearance: Normal appearance. He is not ill-appearing, toxic-appearing or diaphoretic.  HENT:     Head: Normocephalic and atraumatic.  Cardiovascular:     Rate and Rhythm: Normal rate and regular rhythm.     Heart sounds: Normal heart sounds. No murmur. No friction rub. No gallop.   Pulmonary:     Effort: Pulmonary effort is normal.     Breath sounds: No stridor. No wheezing.  Chest:     Chest wall: No tenderness.  Abdominal:     General: Abdomen is flat. Bowel sounds are normal.  Neurological:     Mental Status: He is alert and oriented to person, place, and time. Mental status is at baseline.     Assessment/Plan:  Principal Problem:   MSSA bacteremia Active Problems:   Malnutrition of moderate degree   Pressure injury of skin   Palliative care encounter   Critical illness myopathy   Abscess   Pulmonary Edema:  - Continue Amlodipine 5 mg  - Continue Lasix 40 mg - Thoracentesis: Extracted 1.5L of yellow fluid (02/07/2019).   Results:  - LDH: 80  - Body Fluid Cell Count: Hazy in appearance. Neutrophil count: 46% (H), Monocyte-Macrobphage- Serous Fluid:  13 (L).   - Total protein: < 3  - Gram Stain: WBC, Both PMN and Mononuclear  - Culture, Body fluid-bottle: Pending  - PH, Body fluid: 7.5 - We appreciate IR for helping [place] care for Mr. Tiffany.  Hypertension:  -ContinueLisinopril to 40 mg QD - Labetalol 5 mg PRN - Continue Coreg 3.125 mh   Right Lower Extremity Abscess: - Wound culturesgrowing Staph Aureus. - Susceptibilities:  - Resistant: Ciprofloxacin - Susceptible: Clndamycin, Erythromcyin, Gentamicin, Inducible clindamycin, oxacillin, rifampin, tetracycline, trimeth/sulfa, and vancomycin. - Per orthopaedics, wound I+D will be undertaken if it becomes problematic in the outpatient setting.     MSSA Bacteremia 2/2 Soft Tissue Abscess Myositis and LUE Cellulitis: - Afibrile - Continue Zosyn - Total antibiotic course through 02/24/2019 -Wound dressing currently clean with nosigns of pus or drainage.  Anemia of Critical Illness - Continue to monitor  DM  - CBG goal: <180 - Continue sliding scale insulin -Levemir18 units  SVT:  - Continue to monitor  - No LE VTE per duplex 01/23/2019  Hypomagnesia: - Mg: 1.6 (02/08/2019)   Altered Mental Status: Resolved Acute Hypoxic Respiratory Failure [place] PNA: Resolved Encephalopathy/EtOH Withdrawal: Resolved Dysphagia: Resolved  Dispo: Anticipated discharge is today.    [name], MD 02/09/2019, 5:54 AM Pager: [phone]

## 2019-02-09 NOTE — Progress Notes (Addendum)
Patient bed alarm rang out, found crawling on floor attempting to pull cords from wall, stating " I need to stop the lady with COVID". No injuries noted. VSS stable. MD notified. Acknowledged. Placed on low bed with floor mats. Patient oriented x4 this morning during med pass. Sister notified. Verbalized understanding of incident.  Will monitor

## 2019-02-09 NOTE — TOC Transition Note (Signed)
Transition of Care [facility]) - CM/SW Discharge Note   Patient Details  Name: Frederick Moody MRN: [id number] Date of Birth: [birth year]  [facility] CM/SW Contact:  Maryclare Labrador, RN Phone Number: 02/09/2019, 12:17 PM   Clinical Narrative:   Pt will discharge home today in care of his brother, daughter and sisters.  Pt confirmed his sisters Juliann Pulse and Anne Ng will help with IV antibiotic administration.  Pt informed CM that he has also received teaching with PICC line maintenance.  Pt is interested in tub bench as ordered - pt chose [facility] - agency contacted and will supply via charity program.  Pt made aware that [facility] will provide [facility], PT and SW - other modalities are not available with charity, pt confirms he is not at all interested in any facility at discharge and denied concerns with returning home with services available .  Plan is for pts sister to come to hospital today around 2 for training by [employer] team - sister will provide evening doses at home as ordered - [facility] will start services tomorrow am for IV administration.  Pt was matched and TOC pharmacy will provide discharge medications at bedside    Final next level of care: [place] Barriers to Discharge: Barriers Resolved   Patient Goals and CMS Choice Patient states their goals for this hospitalization and ongoing recovery are:: to go home CMS Medicare.gov Compare Post Acute Care list provided to:: Patient Choice offered to / list presented to : Patient  Discharge Placement                       Discharge Plan and Services In-house Referral: Financial Counselor Discharge Planning Services: CM Consult Post Acute Care Choice: Home Health                               Social Determinants of Health (SDOH) Interventions     Readmission Risk Interventions No flowsheet data found.

## 2019-02-09 NOTE — Progress Notes (Signed)
Ortho Trauma Progress Note  Doing well. Leg feels better. No complaints regarding leg. Wants to get out of here.  RLE: Swelling improved. No redness. No warmth other than global warmth of body. No pain with passive dorsiflexion. Indurated area where collection was  A/P RLE collection/abscess  No role for surgical intervention If no improvement after 2-3 weeks or worsening off antibiotic can reeval need for I&D Plan to return to office as needed if no improvement.  Shona Needles, MD [facility] [phone] (phone) [phone] (office) [email or web address]

## 2019-02-10 LAB — CULTURE, BLOOD (ROUTINE X 2)
Culture: NO GROWTH
Culture: NO GROWTH
Special Requests: ADEQUATE

## 2019-02-10 LAB — PATHOLOGIST SMEAR REVIEW

## 2019-02-11 LAB — CULTURE, BODY FLUID W GRAM STAIN -BOTTLE: Culture: NO GROWTH

## 2019-02-12 ENCOUNTER — Telehealth: Payer: Self-pay | Admitting: *Deleted

## 2019-02-12 ENCOUNTER — Telehealth: Payer: Self-pay

## 2019-02-12 DIAGNOSIS — E1169 Type 2 diabetes mellitus with other specified complication: Secondary | ICD-10-CM

## 2019-02-12 LAB — CULTURE, BODY FLUID W GRAM STAIN -BOTTLE: Culture: NO GROWTH

## 2019-02-12 MED ORDER — BLOOD GLUCOSE METER KIT: PACK | 0 refills | Status: DC

## 2019-02-12 MED ORDER — BLOOD GLUCOSE METER KIT: PACK | 0 refills | Status: AC

## 2019-02-12 MED ORDER — METFORMIN HCL 500 MG PO TABS: 500.0000 mg | ORAL_TABLET | Freq: Two times a day (BID) | ORAL | 0 refills | Status: DC

## 2019-02-12 NOTE — Telephone Encounter (Signed)
Received a call today from patient's sister requesting orders to be sent to [facility] to have home health nurse come out today to check on patient's wound. Patient's sister states that during dressing change their was yellow puss on the gauze bandage, and that the area around the wound was red. Per patient's sister home health nurse will need verbal order to go out and see them today.  Per Dr. Linus Salmons okay to give [facility] verbal order to have home health nurse look at patient's wound today. Spoke with Sharee Pimple, Nurse Manager at [facility] who was able to take verbal order, she will have home health nurse go out and look at patient's wound today to make sure there are no concerns with patient's wound. [place]

## 2019-02-12 NOTE — Telephone Encounter (Signed)
Received call from [place] 2W - he had received a call from pt's family, stated pt needs rx for metformin and glucose meter; they had called [facility].   I called /talked to pt's daughter, [facility] Stated pt needs rx for metformin and glucose meter which he has not used before. She had called [facility] , made an (telehealth) appt for next Thursday but since they have not seen the pt yet, they could not prescribe any medications.  Paged /talked to Dr Tarri Abernethy - stated he will fill rxs.  Talked to [facility] again - informed Dr Tarri Abernethy will fill rx; stated to send rxs to [facility] on [address].

## 2019-02-12 NOTE — Telephone Encounter (Signed)
Paged for a glucose meter and metformin refill. Refills sent.

## 2019-02-13 ENCOUNTER — Telehealth: Payer: Self-pay

## 2019-02-13 NOTE — Telephone Encounter (Signed)
Incoming call from pt's sister-states rx for metformin and  glucose testing supplies were not at pharmacy. Rx was authorized by MD on 8/20, but was set to "print", CMA phoned in metformin rx.  Informed pt's family that rx for glucose testing supplies was not necessary because pt is self pay.  CMA recommended [employer] meter and speaking with pharmacist to see which meter and strips were least expensive.  Family agreed.  No further action needed, phone call complete.Despina Hidden Cassady8/21/202010:34 AM

## 2019-02-13 NOTE — Telephone Encounter (Signed)
Seeking verbal approval for physical therapy.  One time one week , two week one , three week one.    Verbal approval based on 02-12-2019 phone note from home health agency approval by Dr Linus Salmons.   Pt scheduled for primary care visit 02-19-2019.

## 2019-02-16 ENCOUNTER — Ambulatory Visit (INDEPENDENT_AMBULATORY_CARE_PROVIDER_SITE_OTHER): Payer: Self-pay | Admitting: Internal Medicine

## 2019-02-16 ENCOUNTER — Encounter: Payer: Self-pay | Admitting: Internal Medicine

## 2019-02-16 ENCOUNTER — Telehealth: Payer: Self-pay | Admitting: *Deleted

## 2019-02-16 ENCOUNTER — Other Ambulatory Visit: Payer: Self-pay

## 2019-02-16 VITALS — BP 143/85 | HR 93 | Temp 98.2°F

## 2019-02-16 DIAGNOSIS — L0291 Cutaneous abscess, unspecified: Secondary | ICD-10-CM

## 2019-02-16 DIAGNOSIS — B9561 Methicillin susceptible Staphylococcus aureus infection as the cause of diseases classified elsewhere: Secondary | ICD-10-CM

## 2019-02-16 DIAGNOSIS — Z452 Encounter for adjustment and management of vascular access device: Secondary | ICD-10-CM

## 2019-02-16 DIAGNOSIS — R7881 Bacteremia: Secondary | ICD-10-CM

## 2019-02-16 NOTE — Telephone Encounter (Signed)
Per verbal from [place] called [facility] to give Pull PICC date patient gets his last dose 02/23/19 and he can have his PICC out 02/24/19. Order give to [facility] and she read back and was also advised patient is aware.

## 2019-02-18 DIAGNOSIS — Z452 Encounter for adjustment and management of vascular access device: Secondary | ICD-10-CM | POA: Insufficient documentation

## 2019-02-18 NOTE — Progress Notes (Signed)
   Subjective:    Patient ID: Frederick Moody, male    DOB: 1955/11/23, 63 y.o.   MRN: [id number]  HPI Here for hsfu. He developed MSSA bacteremia due to a vertebral soft tissue/paraspinal abscess and underwent I and D on 7/21 by Dr. [name].  TTE negative for vegetation and he is on a planned 6 week course of IV cefazolin through 02/24/19.   He is here with his sister.  He is doing well with no issues.  Saw Dr. [name] yesterday and healing well.  No further drainage and no associated fever or chills.  No rash or diarrhea.    Review of Systems  Constitutional: Negative for chills and fever.  Gastrointestinal: Negative for diarrhea and nausea.  Skin: Negative for rash.  Neurological: Negative for dizziness.       Objective:   Physical Exam Constitutional:      Appearance: Normal appearance.  Eyes:     General: No scleral icterus. Cardiovascular:     Rate and Rhythm: Normal rate and regular rhythm.     Heart sounds: No murmur.  Pulmonary:     Effort: Pulmonary effort is normal.  Musculoskeletal:     Comments: picc line in place, no erythema or drainage  Skin:    Findings: No rash.  Neurological:     Mental Status: He is alert.  Psychiatric:        Mood and Affect: Mood normal.   SH: no tobacco        Assessment & Plan:

## 2019-02-18 NOTE — Progress Notes (Signed)
Patient ID: Frederick Moody, male   DOB: [birth year], 63 y.o.   MRN: [id number]  Virtual Visit via Telephone Note  I connected with Frederick Moody on 02/19/19 at 10:30 AM EDT by telephone and verified that I am speaking with the correct person using two identifiers.   I discussed the limitations, risks, security and privacy concerns of performing an evaluation and management service by telephone and the availability of in person appointments. I also discussed with the patient that there may be a patient responsible charge related to this service. The patient expressed understanding and agreed to proceed.  Patient location:  home My Location:  [facility] office Persons on the call:  Me and the patient and his sister  History of Present Illness: After hospitalization 7/15-8/17/2020.  Saw ID on 02/16/2019-last dose antibiotics scheduled for 02/24/2019.   Blood sugars running from 160-319.    Tolerating metformin and levimir w/o a problem.  Does not have home BP cuff.  Interested in diabetes teaching.  No longer drinking alcohol and doesn't think he will have a problem staying stopped.  No fever.  No N/V/D.    From discharge summary: Discharge Diagnosis: 1. DKA  2. EtOH withdrawal  3. MSSA Bacteremia  4. Myopathy of Critical Illness  5. Flash Pulmonary Edema / Hypertension  6. 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  MSSA Bacteremia. Please ensure th patient has home health resources to continue his IV Ancef and will follow-up with ID. DM. Up titrate the patient's insulin as needed. HTN. Patient was started on multiple medications for his HTN. Please check a BMP  2.  Labs / imaging needed at time of follow-up: BMP, CBC  3.  Pending labs/ test needing follow-up: None    Observations/Objective:  A&Ox3   Assessment and Plan: 1. Type 2 diabetes mellitus with other specified complication, unspecified whether long term insulin use (HCC) Uncontrolled but improving-will increase  metformin to 1000mg  bid.  Check blood sugars bid and record and bring to visit Tuesday with Frederick Moody for diabetic teaching - metFORMIN (GLUCOPHAGE) 500 MG tablet; Take 2 tablets (1,000 mg total) by mouth 2 (two) times daily with a meal.  Dispense: 120 tablet; Refill: 2 - Insulin Detemir (LEVEMIR) 100 UNIT/ML Pen; Inject 20 Units into the skin daily.  Dispense: 15 mL; Refill: 2  2. Hypertension, unspecified type - carvedilol (COREG) 3.125 MG tablet; Take 1 tablet (3.125 mg total) by mouth 2 (two) times daily with a meal.  Dispense: 60 tablet; Refill: 2 - furosemide (LASIX) 40 MG tablet; Take 1 tablet (40 mg total) by mouth daily.  Dispense: 30 tablet; Refill: 2 - lisinopril (ZESTRIL) 40 MG tablet; Take 1 tablet (40 mg total) by mouth daily.  Dispense: 30 tablet; Refill: 2 - amLODipine (NORVASC) 5 MG tablet; Take 1 tablet (5 mg total) by mouth daily.  Dispense: 30 tablet; Refill: 2  3. Hospital discharge follow-up Much improved  4. MSSA bacteremia Continue ID f/up-last dose of IV antibiotics scheduled 9/1.      Follow Up Instructions: Frederick Moody 02/24/2019 Assign PCP in 1 month   I discussed the assessment and treatment plan with the patient. The patient was provided an opportunity to ask questions and all were answered. The patient agreed with the plan and demonstrated an understanding of the instructions.   The patient was advised to call back or seek an in-person evaluation if the symptoms worsen or if the condition fails to improve as anticipated.  I provided 15 minutes  of non-face-to-face time during this encounter.   Frederick Caldron, PA-C

## 2019-02-18 NOTE — Assessment & Plan Note (Signed)
No issues with the line and thiis can be removed by home health after the last dose.

## 2019-02-18 NOTE — Assessment & Plan Note (Signed)
Has cleared, no evidence of infective endocarditis but finishing a prolonged treatment course regardless.  Can stop antibiotics after last dose 9/1.

## 2019-02-18 NOTE — Assessment & Plan Note (Signed)
Debrided in the hospital and doing well now.  Continue with wound care.

## 2019-02-19 ENCOUNTER — Ambulatory Visit: Payer: Self-pay | Attending: Family Medicine | Admitting: Physician Assistant

## 2019-02-19 DIAGNOSIS — Z09 Encounter for follow-up examination after completed treatment for conditions other than malignant neoplasm: Secondary | ICD-10-CM

## 2019-02-19 DIAGNOSIS — I10 Essential (primary) hypertension: Secondary | ICD-10-CM

## 2019-02-19 DIAGNOSIS — E1169 Type 2 diabetes mellitus with other specified complication: Secondary | ICD-10-CM

## 2019-02-19 DIAGNOSIS — R7881 Bacteremia: Secondary | ICD-10-CM

## 2019-02-19 MED ORDER — INSULIN DETEMIR 100 UNIT/ML FLEXPEN: 20.0000 [IU] | PEN_INJECTOR | Freq: Every day | SUBCUTANEOUS | 2 refills | Status: DC

## 2019-02-19 MED ORDER — FUROSEMIDE 40 MG PO TABS: 40.0000 mg | ORAL_TABLET | Freq: Every day | ORAL | 2 refills | Status: DC

## 2019-02-19 MED ORDER — LISINOPRIL 40 MG PO TABS: 40.0000 mg | ORAL_TABLET | Freq: Every day | ORAL | 2 refills | Status: DC

## 2019-02-19 MED ORDER — METFORMIN HCL 500 MG PO TABS: 1000.0000 mg | ORAL_TABLET | Freq: Two times a day (BID) | ORAL | 2 refills | Status: DC

## 2019-02-19 MED ORDER — AMLODIPINE BESYLATE 5 MG PO TABS: 5.0000 mg | ORAL_TABLET | Freq: Every day | ORAL | 2 refills | Status: DC

## 2019-02-19 MED ORDER — CARVEDILOL 3.125 MG PO TABS: 3.1250 mg | ORAL_TABLET | Freq: Two times a day (BID) | ORAL | 2 refills | Status: DC

## 2019-02-19 MED FILL — metFORMIN HCL 500 MG TABS: 500 | 30 days supply | Qty: 120 | Fill #0

## 2019-02-23 ENCOUNTER — Encounter: Payer: Self-pay | Admitting: Internal Medicine

## 2019-02-24 ENCOUNTER — Ambulatory Visit: Payer: Self-pay | Attending: Family Medicine | Admitting: Pharmacist

## 2019-02-24 ENCOUNTER — Other Ambulatory Visit: Payer: Self-pay

## 2019-02-24 DIAGNOSIS — E1169 Type 2 diabetes mellitus with other specified complication: Secondary | ICD-10-CM

## 2019-02-24 MED ORDER — TRESIBA FLEXTOUCH 100 UNIT/ML ~~LOC~~ SOPN: 24.0000 [IU] | PEN_INJECTOR | Freq: Every day | SUBCUTANEOUS | 1 refills | Status: DC

## 2019-02-24 MED FILL — !TRESIBA FLEXTOUCH 100 UNIT: 100/ML (3) | 25 days supply | Qty: 6 | Fill #0

## 2019-02-24 NOTE — Progress Notes (Signed)
    S:     No chief complaint on file.  Patient arrives in good spirits.  Presents for diabetes evaluation, education, and management Patient was referred and last seen by Levada Dy on 02/19/19.   Family/Social History:  - FHx: none listed - Tobacco: never smoker - No OH since hospitalization   Insurance coverage/medication affordability: self-pay  Patient reports adherence with medications.  Current diabetes medications include: Levemir 20 units in the morning, metformin 1000 mg BID Current hypertension medications include: amlodipine 5 mg daily, carvedilol 3.125 mg BID, furosemide 40 mg daily, lisinopril 40 mg daily  Current hyperlipidemia medications include: none   Patient denies hypoglycemic events.  Patient reported dietary habits:  - Uninformed on proper diabetic diet  Patient-reported exercise habits:  - Limited at this time d/t PICC line/ABx infusion   Patient denies nocturia.  Patient reports neuropathy. Patient reports visual changes. Patient denies self foot exams.    O:  Physical Exam   ROS   Lab Results  Component Value Date   HGBA1C 11.7 (H) 01/07/2019   There were no vitals filed for this visit.  Lipid Panel  No results found for: CHOL, TRIG, HDL, CHOLHDL, VLDL, LDLCALC, LDLDIRECT  Home fasting CBG: 160s - 250s   2 hour post-prandial/random CBG: 250s - 300s.  Clinical ASCVD: No  The ASCVD Risk score Mikey Bussing DC Jr., et al., 2013) failed to calculate for the following reasons:   Cannot find a previous HDL lab   Cannot find a previous total cholesterol lab  A/P: Diabetes longstanding currently uncontrolled. Patient is not able to verbalize appropriate hypoglycemia management plan. Patient is adherent with medication. Control is suboptimal due to dietary indiscretion and physical inactivity.  Patient with recent hospitalization for DKA complicated by alcohol withdrawal requiring intubation. Further complicated by pneumonia and MSSA bacteremia. Pt has  PICC line in place for IV abx therapy which he reports will be completed today. Because of this history, need for proper wound healing, and his continued hyperglycemia at home, I recommend to optimize insulin therapy. Will stop Levemir and start [place]. He will establish next week.   -Discontinued Levemir.  -Start Tyler Aas 24 units qHS -Continue metformin  -Extensively discussed pathophysiology of DM, recommended lifestyle interventions, dietary effects on glycemic control -Counseled on s/sx of and management of hypoglycemia -Next A1C anticipated 03/2019.   ASCVD risk - primary prevention in patient with DM. Needs lipid and hepatic panel; at least moderate intensity statin should be used but will defer to PCP next week.  -Recommend updated lipid, LFT  HM:  - Pt with PICC line in place; will defer immunizations until this is removed  - Pt will think about his flu shot  - Pneumovax, Tetanus indicated  Written patient instructions provided.  Total time in face to face counseling 30 minutes.   Follow up to establish next week.   Benard Halsted, PharmD, [place] [phone]

## 2019-02-24 NOTE — Patient Instructions (Signed)
Thank you for coming to see me today. Please do the following:  1. Stop Levemir. Start [place]. Inject 24 units at bedtime.  2. Continue metformin. Take 2 tablets twice a day.  3. Continue checking blood sugars at home. 4. Continue making the lifestyle changes we've discussed together during our visit. Diet and exercise play a significant role in improving your blood sugars.  5. Follow-up with with a physician to establish care.   Hypoglycemia or low blood sugar:   Low blood sugar can happen quickly and may become an emergency if not treated right away.   While this shouldn't happen often, it can be brought upon if you skip a meal or do not eat enough. Also, if your insulin or other diabetes medications are dosed too high, this can cause your blood sugar to go to low.   Warning signs of low blood sugar include: 1. Feeling shaky or dizzy 2. Feeling weak or tired  3. Excessive hunger 4. Feeling anxious or upset  5. Sweating even when you aren't exercising  What to do if I experience low blood sugar? 1. Check your blood sugar with your meter. If lower than 70, proceed to step 2.  2. Treat with 3-4 glucose tablets or 3 packets of regular sugar. If these aren't around, you can try hard candy. Yet another option would be to drink 4 ounces of fruit juice or 6 ounces of REGULAR soda.  3. Re-check your sugar in 15 minutes. If it is still below 70, do what you did in step 2 again. If has come back up, go ahead and eat a snack or small meal at this time.  Marland Kitchen

## 2019-02-25 ENCOUNTER — Encounter: Payer: Self-pay | Admitting: Pharmacist

## 2019-03-04 ENCOUNTER — Ambulatory Visit: Payer: Self-pay

## 2019-03-10 ENCOUNTER — Other Ambulatory Visit: Payer: Self-pay

## 2019-03-10 ENCOUNTER — Encounter: Payer: Self-pay | Admitting: Family Medicine

## 2019-03-10 ENCOUNTER — Ambulatory Visit: Payer: Self-pay | Attending: Family Medicine | Admitting: Family Medicine

## 2019-03-10 VITALS — BP 123/83 | HR 94 | Temp 98.2°F | Ht 68.0 in | Wt 166.0 lb

## 2019-03-10 DIAGNOSIS — G7281 Critical illness myopathy: Secondary | ICD-10-CM

## 2019-03-10 DIAGNOSIS — I10 Essential (primary) hypertension: Secondary | ICD-10-CM

## 2019-03-10 DIAGNOSIS — Z1159 Encounter for screening for other viral diseases: Secondary | ICD-10-CM

## 2019-03-10 DIAGNOSIS — E1165 Type 2 diabetes mellitus with hyperglycemia: Secondary | ICD-10-CM

## 2019-03-10 DIAGNOSIS — L0291 Cutaneous abscess, unspecified: Secondary | ICD-10-CM

## 2019-03-10 DIAGNOSIS — Z794 Long term (current) use of insulin: Secondary | ICD-10-CM

## 2019-03-10 LAB — GLUCOSE, POCT (MANUAL RESULT ENTRY): POC Glucose: 148 mg/dl — AB (ref 70–99)

## 2019-03-10 MED ORDER — CARVEDILOL 3.125 MG PO TABS: 3.1250 mg | ORAL_TABLET | Freq: Two times a day (BID) | ORAL | 6 refills | Status: DC

## 2019-03-10 MED ORDER — ATORVASTATIN CALCIUM 40 MG PO TABS: 40.0000 mg | ORAL_TABLET | Freq: Every day | ORAL | 6 refills | Status: DC

## 2019-03-10 MED ORDER — FUROSEMIDE 20 MG PO TABS: 20.0000 mg | ORAL_TABLET | Freq: Every day | ORAL | 2 refills | Status: DC

## 2019-03-10 MED ORDER — TRESIBA FLEXTOUCH 100 UNIT/ML ~~LOC~~ SOPN: 28.0000 [IU] | PEN_INJECTOR | Freq: Every day | SUBCUTANEOUS | 6 refills | Status: DC

## 2019-03-10 MED ORDER — LISINOPRIL 40 MG PO TABS: 40.0000 mg | ORAL_TABLET | Freq: Every day | ORAL | 6 refills | Status: DC

## 2019-03-10 MED ORDER — AMLODIPINE BESYLATE 5 MG PO TABS: 5.0000 mg | ORAL_TABLET | Freq: Every day | ORAL | 6 refills | Status: DC

## 2019-03-10 MED ORDER — METFORMIN HCL 500 MG PO TABS: 1000.0000 mg | ORAL_TABLET | Freq: Two times a day (BID) | ORAL | 6 refills | Status: DC

## 2019-03-10 MED FILL — ATORVASTATIN CALCIUM 40 MG: 40 | 30 days supply | Qty: 30 | Fill #0

## 2019-03-10 MED FILL — AMLODIPINE BESYLATE 5 MG TA: 5 | 30 days supply | Qty: 30 | Fill #0

## 2019-03-10 MED FILL — CARVEDILOL 3.125 MG TABLET: 3.125 | 30 days supply | Qty: 60 | Fill #0

## 2019-03-10 MED FILL — LISINOPRIL 40 MG TABLET: 40 | 30 days supply | Qty: 30 | Fill #0

## 2019-03-10 MED FILL — FUROSEMIDE 20 MG TABS: 20 | 30 days supply | Qty: 30 | Fill #0

## 2019-03-10 NOTE — Progress Notes (Signed)
Patient needs refills on medications. 

## 2019-03-10 NOTE — Progress Notes (Signed)
Subjective:  Patient ID: Frederick Moody, male    DOB: Jan 08, 1956  Age: 63 y.o. MRN: [id number]  CC: Diabetes   HPI Frederick Moody is a 63 year old male with a history of type 2 diabetes mellitus (A1c 11.7), hypertension, back abscess here to establish care. He had a hospitalization in 12/2018 where he was managed for altered mental status, back abscess after a fall, MSSA bacteremia (echo negative for endocarditis), DKA with new diagnosis of type 2 diabetes mellitus.  He also had flash pulmonary edema for which she was placed on Lasix. He has since completed his 6-week course of antibiotics and his PICC line has been removed.  Back abscess has been followed by general surgery with an upcoming appointment in 2 weeks.  Dressing changes were performed by his sister and wound seems to be doing well; previously had home health nurse but this has since ended after his PICC line was discontinued. He was released by orthopedic surgery. He does have persisting ambulation difficulty and has to walk slowly.  His session of physical therapy and today.  His blood sugars have improved since his visit with the physician assistant and the clinical pharmacist with fasting sugars ranging between 149 and 220; random sugars between 171 and 338.  He is compliant with his medications.  Past Medical History:  Diagnosis Date  . Diabetes mellitus without complication ([place])   . ETOH abuse     Past Surgical History:  Procedure Laterality Date  . APPENDECTOMY    . IR THORACENTESIS ASP PLEURAL SPACE W/IMG GUIDE  02/06/2019  . IRRIGATION AND DEBRIDEMENT ABSCESS N/A 01/13/2019   Procedure: IRRIGATION AND DEBRIDEMENT OF BACK ABSCESS;  Surgeon: Donnie Mesa, MD;  Location: [facility];  Service: General;  Laterality: N/A;    History reviewed. No pertinent family history.  No Known Allergies  Outpatient Medications Prior to Visit  Medication Sig Dispense Refill  . blood glucose meter kit and supplies Please check  your blood sugars twice daily. Once in the morning and once before bed. 1 each 0  . Insulin Pen Needle (PEN NEEDLES) 32G X 4 MM MISC 1 Units by Does not apply route daily. 90 each 0  . amLODipine (NORVASC) 5 MG tablet Take 1 tablet (5 mg total) by mouth daily. 30 tablet 2  . carvedilol (COREG) 3.125 MG tablet Take 1 tablet (3.125 mg total) by mouth 2 (two) times daily with a meal. 60 tablet 2  . furosemide (LASIX) 40 MG tablet Take 1 tablet (40 mg total) by mouth daily. 30 tablet 2  . insulin degludec (TRESIBA FLEXTOUCH) 100 UNIT/ML SOPN FlexTouch Pen Inject 0.24 mLs (24 Units total) into the skin daily. 15 mL 1  . lisinopril (ZESTRIL) 40 MG tablet Take 1 tablet (40 mg total) by mouth daily. 30 tablet 2  . metFORMIN (GLUCOPHAGE) 500 MG tablet Take 2 tablets (1,000 mg total) by mouth 2 (two) times daily with a meal. 120 tablet 2   No facility-administered medications prior to visit.      ROS Review of Systems  Constitutional: Negative for activity change and appetite change.  HENT: Negative for sinus pressure and sore throat.   Eyes: Negative for visual disturbance.  Respiratory: Negative for cough, chest tightness and shortness of breath.   Cardiovascular: Negative for chest pain and leg swelling.  Gastrointestinal: Negative for abdominal distention, abdominal pain, constipation and diarrhea.  Endocrine: Negative.   Genitourinary: Negative for dysuria.  Musculoskeletal: Negative for joint swelling and myalgias.  Skin: Positive for  wound. Negative for rash.  Allergic/Immunologic: Negative.   Neurological: Negative for weakness, light-headedness and numbness.  Psychiatric/Behavioral: Negative for dysphoric mood and suicidal ideas.    Objective:  BP 123/83   Pulse 94   Temp 98.2 F (36.8 C) (Oral)   Ht _0  (1.727 m)   Wt 166 lb (75.3 kg)   SpO2 98%   BMI 25.24 kg/m   BP/Weight 03/10/2019 02/16/2019 2/29/7989  Systolic BP 211 941 740  Diastolic BP 83 85 71  Wt. (Lbs) 166 - 176   BMI 25.24 - -      Physical Exam Constitutional:      Appearance: He is well-developed.  Cardiovascular:     Rate and Rhythm: Normal rate.     Heart sounds: Normal heart sounds. No murmur.  Pulmonary:     Effort: Pulmonary effort is normal.     Breath sounds: Normal breath sounds. No wheezing or rales.  Chest:     Chest wall: No tenderness.  Abdominal:     General: Bowel sounds are normal. There is no distension.     Palpations: Abdomen is soft. There is no mass.     Tenderness: There is no abdominal tenderness.  Musculoskeletal: Normal range of motion.     Right lower leg: Edema (calf is firm but no TTP or warmth) present.  Skin:    Comments: Ulcer and the base of neck with granulation tissue, no discharge Bruises on lower extremities  Neurological:     Mental Status: He is alert and oriented to person, place, and time.     CMP Latest Ref Rng & Units 02/09/2019 02/08/2019 02/07/2019  Glucose 70 - 99 mg/dL 206(H) 184(H) 259(H)  BUN 8 - 23 mg/dL 25(H) 26(H) 25(H)  Creatinine 0.61 - 1.24 mg/dL 0.83 0.83 0.86  Sodium 135 - 145 mmol/L 136 134(L) 135  Potassium 3.5 - 5.1 mmol/L 3.8 3.8 3.9  Chloride 98 - 111 mmol/L 105 101 102  CO2 22 - 32 mmol/L _1 Calcium 8.9 - 10.3 mg/dL 7.8(L) 7.8(L) 8.0(L)  Total Protein 6.5 - 8.1 g/dL - - -  Total Bilirubin 0.3 - 1.2 mg/dL - - -  Alkaline Phos 38 - 126 U/L - - -  AST 15 - 41 U/L - - -  ALT 0 - 44 U/L - - -    Lipid Panel  No results found for: CHOL, TRIG, HDL, CHOLHDL, VLDL, LDLCALC, LDLDIRECT  CBC    Component Value Date/Time   WBC 5.5 02/05/2019 0346   RBC 2.77 (L) 02/05/2019 0346   HGB 8.8 (L) 02/05/2019 0346   HCT 27.7 (L) 02/05/2019 0346   PLT 299 02/05/2019 0346   MCV 100.0 02/05/2019 0346   MCH 31.8 02/05/2019 0346   MCHC 31.8 02/05/2019 0346   RDW 15.2 02/05/2019 0346   LYMPHSABS 1.1 02/05/2019 0346   MONOABS 0.4 02/05/2019 0346   EOSABS 0.1 02/05/2019 0346   BASOSABS 0.0 02/05/2019 0346    Lab Results   Component Value Date   HGBA1C 11.7 (H) 01/07/2019    Assessment & Plan:   1. Type 2 diabetes mellitus with hyperglycemia, with long-term current use of insulin (HCC) Uncontrolled with A1c of 11.7 Blood sugars reveal improvement Increased Tresiba to 28 units Counseled on Diabetic diet, my plate method, 814 minutes of moderate intensity exercise/week Keep blood sugar logs with fasting goals of 80-120 mg/dl, random of less than 180 and in the event of sugars less than 60 mg/dl or greater  than 400 mg/dl please notify the clinic ASAP. It is recommended that you undergo annual eye exams and annual foot exams. Pneumonia vaccine is recommended. - POCT glucose (manual entry) - insulin degludec (TRESIBA FLEXTOUCH) 100 UNIT/ML SOPN FlexTouch Pen; Inject 0.28 mLs (28 Units total) into the skin daily.  Dispense: 15 mL; Refill: 6 - metFORMIN (GLUCOPHAGE) 500 MG tablet; Take 2 tablets (1,000 mg total) by mouth 2 (two) times daily with a meal.  Dispense: 120 tablet; Refill: 6 - atorvastatin (LIPITOR) 40 MG tablet; Take 1 tablet (40 mg total) by mouth daily.  Dispense: 30 tablet; Refill: 6  2. Hypertension, unspecified type Controlled Decreased dose of Lasix as pulmonary edema seems to have resolved We will check renal function and consider completely discontinuing Lasix if no longer indicated - carvedilol (COREG) 3.125 MG tablet; Take 1 tablet (3.125 mg total) by mouth 2 (two) times daily with a meal.  Dispense: 60 tablet; Refill: 6 - lisinopril (ZESTRIL) 40 MG tablet; Take 1 tablet (40 mg total) by mouth daily.  Dispense: 30 tablet; Refill: 6 - amLODipine (NORVASC) 5 MG tablet; Take 1 tablet (5 mg total) by mouth daily.  Dispense: 30 tablet; Refill: 6 - furosemide (LASIX) 20 MG tablet; Take 1 tablet (20 mg total) by mouth daily.  Dispense: 30 tablet; Refill: 2 - Basic Metabolic Panel  3. Critical illness myopathy He will be completing physical therapy today but does have persisting ambulation  difficulties Unfortunately he has no medical coverage for additional referral He will continue home exercises Completed form for handicap placard  4. Abscess Healing Dressing changes performed by his sister Keep follow-up appointment with general surgery  5. Need for hepatitis C screening test - Hepatitis c antibody (reflex)   Health Care Maintenance: Diabetic healthcare maintenance at next visit Meds ordered this encounter  Medications  . insulin degludec (TRESIBA FLEXTOUCH) 100 UNIT/ML SOPN FlexTouch Pen    Sig: Inject 0.28 mLs (28 Units total) into the skin daily.    Dispense:  15 mL    Refill:  6    Dose change  . metFORMIN (GLUCOPHAGE) 500 MG tablet    Sig: Take 2 tablets (1,000 mg total) by mouth 2 (two) times daily with a meal.    Dispense:  120 tablet    Refill:  6  . carvedilol (COREG) 3.125 MG tablet    Sig: Take 1 tablet (3.125 mg total) by mouth 2 (two) times daily with a meal.    Dispense:  60 tablet    Refill:  6  . lisinopril (ZESTRIL) 40 MG tablet    Sig: Take 1 tablet (40 mg total) by mouth daily.    Dispense:  30 tablet    Refill:  6  . amLODipine (NORVASC) 5 MG tablet    Sig: Take 1 tablet (5 mg total) by mouth daily.    Dispense:  30 tablet    Refill:  6  . furosemide (LASIX) 20 MG tablet    Sig: Take 1 tablet (20 mg total) by mouth daily.    Dispense:  30 tablet    Refill:  2    Dose change  . atorvastatin (LIPITOR) 40 MG tablet    Sig: Take 1 tablet (40 mg total) by mouth daily.    Dispense:  30 tablet    Refill:  6    Follow-up: Return in about 2 months (around 05/10/2019) for Medical conditions.       Charlott Rakes, MD, FAAFP. [facility] and  [place], [place]   03/10/2019, 11:44 AM

## 2019-03-10 NOTE — Patient Instructions (Signed)

## 2019-03-11 LAB — BASIC METABOLIC PANEL
BUN/Creatinine Ratio: 20 (ref 10–24)
BUN: 18 mg/dL (ref 8–27)
CO2: 24 mmol/L (ref 20–29)
Calcium: 9.2 mg/dL (ref 8.6–10.2)
Chloride: 98 mmol/L (ref 96–106)
Creatinine, Ser: 0.89 mg/dL (ref 0.76–1.27)
GFR calc Af Amer: 105 mL/min/{1.73_m2} (ref >59)
GFR calc non Af Amer: 91 mL/min/{1.73_m2} (ref >59)
Glucose: 139 mg/dL — ABNORMAL HIGH (ref 65–99)
Potassium: 4.5 mmol/L (ref 3.5–5.2)
Sodium: 138 mmol/L (ref 134–144)

## 2019-03-11 LAB — HEPATITIS C ANTIBODY (REFLEX): HCV Ab: <0.1 s/co ratio (ref 0.0–0.9)

## 2019-03-11 LAB — HCV COMMENT:

## 2019-03-12 ENCOUNTER — Telehealth (INDEPENDENT_AMBULATORY_CARE_PROVIDER_SITE_OTHER): Payer: Self-pay

## 2019-03-12 NOTE — Telephone Encounter (Signed)
-----   Message from Enobong Newlin, MD sent at 03/11/2019  5:12 PM EDT ----- Please inform the patient that labs are normal. Thank you. 

## 2019-03-12 NOTE — Telephone Encounter (Signed)
Patient name and DOB has been verified Patient was informed of lab results. Patient had no questions.  

## 2019-03-19 ENCOUNTER — Other Ambulatory Visit: Payer: Self-pay

## 2019-03-19 ENCOUNTER — Ambulatory Visit: Payer: Self-pay | Attending: Family Medicine

## 2019-03-19 MED FILL — metFORMIN HCL 500 MG TABS: 500 | 30 days supply | Qty: 120 | Fill #1

## 2019-03-19 MED FILL — !TRESIBA FLEXTOUCH 100 UNIT: 100/ML (3) | 25 days supply | Qty: 6 | Fill #1

## 2019-04-06 MED FILL — AMLODIPINE BESYLATE 5 MG TA: 5 | 30 days supply | Qty: 30 | Fill #1

## 2019-04-06 MED FILL — LISINOPRIL 40 MG TABLET: 40 | 30 days supply | Qty: 30 | Fill #1

## 2019-04-06 MED FILL — FUROSEMIDE 20 MG TABS: 20 | 30 days supply | Qty: 30 | Fill #1

## 2019-04-06 MED FILL — CARVEDILOL 3.125 MG TABLET: 3.125 | 30 days supply | Qty: 60 | Fill #1

## 2019-04-06 MED FILL — ATORVASTATIN CALCIUM 40 MG: 40 | 30 days supply | Qty: 30 | Fill #1

## 2019-04-07 MED FILL — !TRESIBA FLEXTOUCH 100 UNIT: 100/ML (3) | 31 days supply | Qty: 9 | Fill #0

## 2019-04-27 MED FILL — metFORMIN HCL 500 MG TABS: 500 | 30 days supply | Qty: 120 | Fill #2

## 2019-04-29 ENCOUNTER — Ambulatory Visit: Payer: Self-pay

## 2019-04-29 DIAGNOSIS — E1165 Type 2 diabetes mellitus with hyperglycemia: Secondary | ICD-10-CM

## 2019-05-04 MED FILL — LISINOPRIL 40 MG TABLET: 40 | 30 days supply | Qty: 30 | Fill #2

## 2019-05-04 MED FILL — FUROSEMIDE 20 MG TABS: 20 | 30 days supply | Qty: 30 | Fill #2

## 2019-05-04 MED FILL — AMLODIPINE BESYLATE 5 MG TA: 5 | 30 days supply | Qty: 30 | Fill #2

## 2019-05-04 MED FILL — CARVEDILOL 3.125 MG TABLET: 3.125 | 30 days supply | Qty: 60 | Fill #2

## 2019-05-04 MED FILL — ATORVASTATIN CALCIUM 40 MG: 40 | 30 days supply | Qty: 30 | Fill #2

## 2019-05-05 MED FILL — $TRESIBA FLEXTOUCH 100 UNIT: 100 | 31 days supply | Qty: 9 | Fill #1

## 2019-05-13 ENCOUNTER — Encounter: Payer: Self-pay | Admitting: Family Medicine

## 2019-05-13 ENCOUNTER — Ambulatory Visit: Payer: Self-pay | Attending: Family Medicine | Admitting: Family Medicine

## 2019-05-13 ENCOUNTER — Other Ambulatory Visit: Payer: Self-pay

## 2019-05-13 VITALS — BP 110/74 | HR 85 | Temp 98.0°F | Ht 68.0 in | Wt 160.0 lb

## 2019-05-13 DIAGNOSIS — G4709 Other insomnia: Secondary | ICD-10-CM

## 2019-05-13 DIAGNOSIS — E1149 Type 2 diabetes mellitus with other diabetic neurological complication: Secondary | ICD-10-CM

## 2019-05-13 DIAGNOSIS — W19XXXA Unspecified fall, initial encounter: Secondary | ICD-10-CM

## 2019-05-13 DIAGNOSIS — R42 Dizziness and giddiness: Secondary | ICD-10-CM

## 2019-05-13 DIAGNOSIS — Z794 Long term (current) use of insulin: Secondary | ICD-10-CM

## 2019-05-13 LAB — POCT GLYCOSYLATED HEMOGLOBIN (HGB A1C): HbA1c, POC (controlled diabetic range): 5.7 % (ref 0.0–7.0)

## 2019-05-13 LAB — GLUCOSE, POCT (MANUAL RESULT ENTRY): POC Glucose: 110 mg/dl — AB (ref 70–99)

## 2019-05-13 MED ORDER — GABAPENTIN 300 MG PO CAPS: 300.0000 mg | ORAL_CAPSULE | Freq: Every day | ORAL | 3 refills | Status: DC

## 2019-05-13 MED ORDER — TRESIBA FLEXTOUCH 100 UNIT/ML ~~LOC~~ SOPN: 26.0000 [IU] | PEN_INJECTOR | Freq: Every day | SUBCUTANEOUS | 6 refills | Status: DC

## 2019-05-13 MED FILL — GABAPENTIN 300 MG CAPSULE: 300 | 30 days supply | Qty: 30 | Fill #0

## 2019-05-13 NOTE — Progress Notes (Signed)
Subjective:  Patient ID: Frederick Moody, male    DOB: June 06, 1956  Age: 63 y.o. MRN: [id number]  CC: Diabetes   HPI Frederick Moody is a 63 year old male with a history of type 2 diabetes mellitus (A1c 5.7), hypertension, back abscess here for follow-up visit. He had a hospitalization in 12/2018 where he was managed for altered mental status, back abscess after a fall, MSSA bacteremia (echo negative for endocarditis), DKA with new diagnosis of type 2 diabetes mellitus.  He also had flash pulmonary edema for which he was placed on Lasix.  His A1c is 5.7 which has improved from 11.7 previously and he denies hypoglycemia and fasting sugars have ranged between 80-152.  He does endorse numbness in his feet. He complains of dizziness which occurs if he stands too fast and sometimes he has to lean against his car on his way back from the mailbox.  He has woke up to use the bathroom at nighttime falling due to feeling dizzy.  He has been using excessive amounts of OTC sedatives to assist with his insomnia as he could stay awake till 4 AM.  He does drink some coffee but does this only in the mornings. He never made it to see his general surgeon for follow-up on his back abscess but his sister states the abscess has healed as she has been dressing and it no longer requires dressing. Denies chest pain, dyspnea, wheezing or pedal edema.  Endorses compliance with all his medications.  Past Medical History:  Diagnosis Date  . Diabetes mellitus without complication ([place])   . ETOH abuse     Past Surgical History:  Procedure Laterality Date  . APPENDECTOMY    . IR THORACENTESIS ASP PLEURAL SPACE W/IMG GUIDE  02/06/2019  . IRRIGATION AND DEBRIDEMENT ABSCESS N/A 01/13/2019   Procedure: IRRIGATION AND DEBRIDEMENT OF BACK ABSCESS;  Surgeon: Donnie Mesa, MD;  Location: [facility];  Service: General;  Laterality: N/A;    History reviewed. No pertinent family history.  No Known Allergies  Outpatient  Medications Prior to Visit  Medication Sig Dispense Refill  . amLODipine (NORVASC) 5 MG tablet Take 1 tablet (5 mg total) by mouth daily. 30 tablet 6  . atorvastatin (LIPITOR) 40 MG tablet Take 1 tablet (40 mg total) by mouth daily. 30 tablet 6  . blood glucose meter kit and supplies Please check your blood sugars twice daily. Once in the morning and once before bed. 1 each 0  . carvedilol (COREG) 3.125 MG tablet Take 1 tablet (3.125 mg total) by mouth 2 (two) times daily with a meal. 60 tablet 6  . furosemide (LASIX) 20 MG tablet Take 1 tablet (20 mg total) by mouth daily. 30 tablet 2  . Insulin Pen Needle (PEN NEEDLES) 32G X 4 MM MISC 1 Units by Does not apply route daily. 90 each 0  . lisinopril (ZESTRIL) 40 MG tablet Take 1 tablet (40 mg total) by mouth daily. 30 tablet 6  . metFORMIN (GLUCOPHAGE) 500 MG tablet Take 2 tablets (1,000 mg total) by mouth 2 (two) times daily with a meal. 120 tablet 6  . insulin degludec (TRESIBA FLEXTOUCH) 100 UNIT/ML SOPN FlexTouch Pen Inject 0.28 mLs (28 Units total) into the skin daily. 15 mL 6   No facility-administered medications prior to visit.      ROS Review of Systems  Constitutional: Negative for activity change and appetite change.  HENT: Negative for sinus pressure and sore throat.   Eyes: Negative for visual disturbance.  Respiratory:  Negative for cough, chest tightness and shortness of breath.   Cardiovascular: Negative for chest pain and leg swelling.  Gastrointestinal: Positive for diarrhea. Negative for abdominal distention, abdominal pain and constipation.  Endocrine: Negative.   Genitourinary: Negative for dysuria.  Musculoskeletal:       See HPI  Skin: Negative for rash.  Allergic/Immunologic: Negative.   Neurological: Positive for light-headedness and numbness. Negative for weakness.  Psychiatric/Behavioral: Negative for dysphoric mood and suicidal ideas.    Objective:  BP 110/74   Pulse 85   Temp 98 F (36.7 C) (Oral)    Ht 5' 8"  (1.727 m)   Wt 160 lb (72.6 kg)   SpO2 100%   BMI 24.33 kg/m   BP/Weight 05/13/2019 03/10/2019 2/53/6644  Systolic BP 034 742 595  Diastolic BP 74 83 85  Wt. (Lbs) 160 166 -  BMI 24.33 25.24 -      Physical Exam Constitutional:      Appearance: He is well-developed.  Neck:     Vascular: No JVD.  Cardiovascular:     Rate and Rhythm: Normal rate.     Heart sounds: Normal heart sounds. No murmur.  Pulmonary:     Effort: Pulmonary effort is normal.     Breath sounds: Normal breath sounds. No wheezing or rales.  Chest:     Chest wall: No tenderness.  Abdominal:     General: Bowel sounds are normal. There is no distension.     Palpations: Abdomen is soft. There is no mass.     Tenderness: There is no abdominal tenderness.  Musculoskeletal: Normal range of motion.     Right lower leg: No edema.     Left lower leg: No edema.  Skin:    Comments: Upper back scar which has healed  Neurological:     Mental Status: He is alert and oriented to person, place, and time.  Psychiatric:        Mood and Affect: Mood normal.     CMP Latest Ref Rng & Units 03/10/2019 02/09/2019 02/08/2019  Glucose 65 - 99 mg/dL 139(H) 206(H) 184(H)  BUN 8 - 27 mg/dL 18 25(H) 26(H)  Creatinine 0.76 - 1.27 mg/dL 0.89 0.83 0.83  Sodium 134 - 144 mmol/L 138 136 134(L)  Potassium 3.5 - 5.2 mmol/L 4.5 3.8 3.8  Chloride 96 - 106 mmol/L 98 105 101  CO2 20 - 29 mmol/L 24 24 24   Calcium 8.6 - 10.2 mg/dL 9.2 7.8(L) 7.8(L)  Total Protein 6.5 - 8.1 g/dL - - -  Total Bilirubin 0.3 - 1.2 mg/dL - - -  Alkaline Phos 38 - 126 U/L - - -  AST 15 - 41 U/L - - -  ALT 0 - 44 U/L - - -    Lipid Panel  No results found for: CHOL, TRIG, HDL, CHOLHDL, VLDL, LDLCALC, LDLDIRECT  CBC    Component Value Date/Time   WBC 5.5 02/05/2019 0346   RBC 2.77 (L) 02/05/2019 0346   HGB 8.8 (L) 02/05/2019 0346   HCT 27.7 (L) 02/05/2019 0346   PLT 299 02/05/2019 0346   MCV 100.0 02/05/2019 0346   MCH 31.8 02/05/2019 0346    MCHC 31.8 02/05/2019 0346   RDW 15.2 02/05/2019 0346   LYMPHSABS 1.1 02/05/2019 0346   MONOABS 0.4 02/05/2019 0346   EOSABS 0.1 02/05/2019 0346   BASOSABS 0.0 02/05/2019 0346    Lab Results  Component Value Date   HGBA1C 5.7 05/13/2019    Assessment & Plan:  1. Type 2 diabetes mellitus with other neurologic complication, with long-term current use of insulin (HCC) Controlled with A1c of which is down from 11.7 previously He has been commended on the significant improvement Decrease dose of long-acting insulin from 28 units down to 26 units to prevent hypoglycemia Commenced on gabapentin for neuropathy Continue with diabetic diet, lifestyle modifications. - Glucose (CBG) - HgB A1c - insulin degludec (TRESIBA FLEXTOUCH) 100 UNIT/ML SOPN FlexTouch Pen; Inject 0.26 mLs (26 Units total) into the skin daily.  Dispense: 30 mL; Refill: 6 - gabapentin (NEURONTIN) 300 MG capsule; Take 1 capsule (300 mg total) by mouth at bedtime.  Dispense: 30 capsule; Refill: 3  2. Dizziness Last set of labs revealed anemia of 8.8 We will check blood count again Could also be due to excessive amounts of sedative Blood pressure is also on the low side; will consider cutting back on his antihypertensive if this persist. We will also need to exclude vertigo if symptoms persist - CBC with Differential/Platelet - Vit D  25 hydroxy (rtn osteoporosis monitoring)  3. Fall, initial encounter See #2 above Advised to change positions slowly  4. Other insomnia Unknown etiology Advised to cut out caffeine completely We have discussed sleep hygiene Hopefully addition of gabapentin for neuropathy will be beneficial   Health Care Maintenance: Flu shot, Pneumovax today needs an eye exam Meds ordered this encounter  Medications  . insulin degludec (TRESIBA FLEXTOUCH) 100 UNIT/ML SOPN FlexTouch Pen    Sig: Inject 0.26 mLs (26 Units total) into the skin daily.    Dispense:  30 mL    Refill:  6    Dose  change  . gabapentin (NEURONTIN) 300 MG capsule    Sig: Take 1 capsule (300 mg total) by mouth at bedtime.    Dispense:  30 capsule    Refill:  3    Follow-up: Return in about 3 months (around 08/13/2019) for medical conditions - virtual.       Charlott Rakes, MD, FAAFP. [facility] and [place], [place]   05/13/2019, 4:30 PM

## 2019-05-13 NOTE — Patient Instructions (Signed)

## 2019-05-13 NOTE — Progress Notes (Signed)
Patient states that he is still weak and dizzy.  Patient states that he has had many recent falls.

## 2019-05-14 ENCOUNTER — Other Ambulatory Visit: Payer: Self-pay | Admitting: Family Medicine

## 2019-05-14 LAB — CBC WITH DIFFERENTIAL/PLATELET
Basophils Absolute: 0.1 10*3/uL (ref 0.0–0.2)
Basos: 1 %
EOS (ABSOLUTE): 0.4 10*3/uL (ref 0.0–0.4)
Eos: 4 %
Hematocrit: 36 % — ABNORMAL LOW (ref 37.5–51.0)
Hemoglobin: 12.4 g/dL — ABNORMAL LOW (ref 13.0–17.7)
Immature Grans (Abs): 0 10*3/uL (ref 0.0–0.1)
Immature Granulocytes: 0 %
Lymphocytes Absolute: 3.5 10*3/uL — ABNORMAL HIGH (ref 0.7–3.1)
Lymphs: 36 %
MCH: 30.5 pg (ref 26.6–33.0)
MCHC: 34.4 g/dL (ref 31.5–35.7)
MCV: 89 fL (ref 79–97)
Monocytes Absolute: 0.8 10*3/uL (ref 0.1–0.9)
Monocytes: 9 %
Neutrophils Absolute: 4.8 10*3/uL (ref 1.4–7.0)
Neutrophils: 50 %
Platelets: 317 10*3/uL (ref 150–450)
RBC: 4.06 x10E6/uL — ABNORMAL LOW (ref 4.14–5.80)
RDW: 13.8 % (ref 11.6–15.4)
WBC: 9.6 10*3/uL (ref 3.4–10.8)

## 2019-05-14 LAB — VITAMIN D 25 HYDROXY (VIT D DEFICIENCY, FRACTURES): Vit D, 25-Hydroxy: 21.6 ng/mL — ABNORMAL LOW (ref 30.0–100.0)

## 2019-05-14 MED ORDER — ERGOCALCIFEROL 1.25 MG (50000 UT) PO CAPS: 50000.0000 [IU] | ORAL_CAPSULE | ORAL | 1 refills | Status: DC

## 2019-05-14 MED FILL — VIT D2 1.25 MG (50,000 UNIT: 1.25 MG | 63 days supply | Qty: 9 | Fill #0

## 2019-06-01 ENCOUNTER — Other Ambulatory Visit: Payer: Self-pay | Admitting: Family Medicine

## 2019-06-01 DIAGNOSIS — I10 Essential (primary) hypertension: Secondary | ICD-10-CM

## 2019-06-01 MED FILL — metFORMIN HCL 500 MG TABS: 500 | 30 days supply | Qty: 120 | Fill #0

## 2019-06-01 MED FILL — ATORVASTATIN CALCIUM 40 MG: 40 | 30 days supply | Qty: 30 | Fill #3

## 2019-06-01 MED FILL — CARVEDILOL 3.125 MG TABLET: 3.125 | 30 days supply | Qty: 60 | Fill #3

## 2019-06-01 MED FILL — AMLODIPINE BESYLATE 5 MG TA: 5 | 30 days supply | Qty: 30 | Fill #3

## 2019-06-01 MED FILL — LISINOPRIL 40 MG TABLET: 40 | 30 days supply | Qty: 30 | Fill #3

## 2019-06-01 NOTE — Telephone Encounter (Signed)
Please refill if appropriate

## 2019-06-02 MED FILL — FUROSEMIDE 20 MG TABS: 20 | 30 days supply | Qty: 30 | Fill #0

## 2019-06-08 MED FILL — GABAPENTIN 300 MG CAPSULE: 300 | 30 days supply | Qty: 30 | Fill #1

## 2019-06-08 MED FILL — $TRESIBA FLEXTOUCH 100 UNIT: 100 | 31 days supply | Qty: 9 | Fill #2

## 2019-07-01 MED FILL — AMLODIPINE BESYLATE 5 MG TA: 5 | 30 days supply | Qty: 30 | Fill #4

## 2019-07-01 MED FILL — FUROSEMIDE 20 MG TABS: 20 | 30 days supply | Qty: 30 | Fill #1

## 2019-07-01 MED FILL — ATORVASTATIN CALCIUM 40 MG: 40 | 30 days supply | Qty: 30 | Fill #4

## 2019-07-01 MED FILL — LISINOPRIL 40 MG TABLET: 40 | 30 days supply | Qty: 30 | Fill #4

## 2019-07-01 MED FILL — metFORMIN HCL 500 MG TABS: 500 | 30 days supply | Qty: 120 | Fill #1

## 2019-07-01 MED FILL — $TRESIBA FLEXTOUCH 100 UNIT: 100 | 31 days supply | Qty: 9 | Fill #3

## 2019-07-01 MED FILL — CARVEDILOL 3.125 MG TABLET: 3.125 | 30 days supply | Qty: 60 | Fill #4

## 2019-07-09 MED FILL — GABAPENTIN 300 MG CAPSULE: 300 | 30 days supply | Qty: 30 | Fill #2

## 2019-07-30 MED FILL — AMLODIPINE BESYLATE 5 MG TA: 5 | 30 days supply | Qty: 30 | Fill #5

## 2019-07-30 MED FILL — metFORMIN HCL 500 MG TABS: 500 | 30 days supply | Qty: 120 | Fill #2

## 2019-07-30 MED FILL — ATORVASTATIN CALCIUM 40 MG: 40 | 30 days supply | Qty: 30 | Fill #5

## 2019-07-30 MED FILL — LISINOPRIL 40 MG TABLET: 40 | 30 days supply | Qty: 30 | Fill #5

## 2019-07-30 MED FILL — CARVEDILOL 3.125 MG TABLET: 3.125 | 30 days supply | Qty: 60 | Fill #5

## 2019-07-30 MED FILL — FUROSEMIDE 20 MG TABS: 20 | 30 days supply | Qty: 30 | Fill #2

## 2019-08-11 MED FILL — GABAPENTIN 300 MG CAPSULE: 300 | 30 days supply | Qty: 30 | Fill #3

## 2019-08-19 ENCOUNTER — Ambulatory Visit: Payer: Self-pay | Attending: Family Medicine | Admitting: Family Medicine

## 2019-08-19 ENCOUNTER — Other Ambulatory Visit: Payer: Self-pay

## 2019-08-19 DIAGNOSIS — Z794 Long term (current) use of insulin: Secondary | ICD-10-CM

## 2019-08-19 DIAGNOSIS — I10 Essential (primary) hypertension: Secondary | ICD-10-CM

## 2019-08-19 DIAGNOSIS — E559 Vitamin D deficiency, unspecified: Secondary | ICD-10-CM

## 2019-08-19 DIAGNOSIS — E11649 Type 2 diabetes mellitus with hypoglycemia without coma: Secondary | ICD-10-CM

## 2019-08-19 DIAGNOSIS — R42 Dizziness and giddiness: Secondary | ICD-10-CM

## 2019-08-19 DIAGNOSIS — E1149 Type 2 diabetes mellitus with other diabetic neurological complication: Secondary | ICD-10-CM

## 2019-08-19 MED ORDER — ERGOCALCIFEROL 1.25 MG (50000 UT) PO CAPS: 50000.0000 [IU] | ORAL_CAPSULE | ORAL | 1 refills | Status: DC

## 2019-08-19 MED ORDER — TRESIBA FLEXTOUCH 100 UNIT/ML ~~LOC~~ SOPN: 22.0000 [IU] | PEN_INJECTOR | Freq: Every day | SUBCUTANEOUS | 6 refills | Status: DC

## 2019-08-19 MED FILL — ?ERGOCALCIFEROL 50000 UNITC: 1.25 MG | 28 days supply | Qty: 4 | Fill #0

## 2019-08-19 NOTE — Progress Notes (Signed)
Patient has been called and DOB has been verified. Patient has been screened and transferred to PCP to start phone visit.   States that he can not walk very good, he gets dizzy sometimes.

## 2019-08-19 NOTE — Progress Notes (Signed)
Virtual Visit via Telephone Note  I connected with Frederick Moody, on 08/19/2019 at 1:38 PM by telephone due to the COVID-19 pandemic and verified that I am speaking with the correct person using two identifiers.   Consent: I discussed the limitations, risks, security and privacy concerns of performing an evaluation and management service by telephone and the availability of in person appointments. I also discussed with the patient that there may be a patient responsible charge related to this service. The patient expressed understanding and agreed to proceed.   Location of Patient: Home  Location of Provider: Clinic   Persons participating in Telemedicine visit: Gracen Southwell - sister Dr. Margarita Rana     History of Present Illness: Frederick Moody is a 64 year old male with a history of type 2 diabetes mellitus (A1c 5.7), hypertension, back abscess here for follow-up visit.   He has had hypoglycemia of 22, 50 on two occassions for which he had to drink some orange juice. Fasting sugars are usually around 70-80; this morning it was 113. He has also had close to 300 in the evening after eating some sweets. Compliant with Tresiba 26 units and Metformin. Insomnia which he previously complained of has resolved ever since he was commenced on gabapentin for diabetic neuropathy.  He still feels dizzy when he gets up at night to use the bathroom but denies sense of the room spinning.  He has not been checking his blood pressure and of note at his last visit, 3 months ago his blood pressure was 110/74. His energy level is low as per his sister who also states the patient has had diarrhea which has improved.  After his last visit, labs revealed vitamin D deficiency for which she had prescribed Drisdol however he never picked this up from the pharmacy.  Past Medical History:  Diagnosis Date  . Diabetes mellitus without complication ([place])   . ETOH abuse    No  Known Allergies  Current Outpatient Medications on File Prior to Visit  Medication Sig Dispense Refill  . amLODipine (NORVASC) 5 MG tablet Take 1 tablet (5 mg total) by mouth daily. 30 tablet 6  . atorvastatin (LIPITOR) 40 MG tablet Take 1 tablet (40 mg total) by mouth daily. 30 tablet 6  . blood glucose meter kit and supplies Please check your blood sugars twice daily. Once in the morning and once before bed. 1 each 0  . carvedilol (COREG) 3.125 MG tablet Take 1 tablet (3.125 mg total) by mouth 2 (two) times daily with a meal. 60 tablet 6  . ergocalciferol (DRISDOL) 1.25 MG (50000 UT) capsule Take 1 capsule (50,000 Units total) by mouth once a week. 9 capsule 1  . furosemide (LASIX) 20 MG tablet TAKE 1 TABLET (20 MG TOTAL) BY MOUTH DAILY. 30 tablet 2  . gabapentin (NEURONTIN) 300 MG capsule Take 1 capsule (300 mg total) by mouth at bedtime. 30 capsule 3  . insulin degludec (TRESIBA FLEXTOUCH) 100 UNIT/ML SOPN FlexTouch Pen Inject 0.26 mLs (26 Units total) into the skin daily. 30 mL 6  . Insulin Pen Needle (PEN NEEDLES) 32G X 4 MM MISC 1 Units by Does not apply route daily. 90 each 0  . lisinopril (ZESTRIL) 40 MG tablet Take 1 tablet (40 mg total) by mouth daily. 30 tablet 6  . metFORMIN (GLUCOPHAGE) 500 MG tablet Take 2 tablets (1,000 mg total) by mouth 2 (two) times daily with a meal. 120 tablet 6   No current facility-administered medications on file  prior to visit.    Observations/Objective: Awake, alert, oriented x3 Not in acute distress  Lab Results  Component Value Date   HGBA1C 5.7 05/13/2019    CMP Latest Ref Rng & Units 03/10/2019 02/09/2019 02/08/2019  Glucose 65 - 99 mg/dL 139(H) 206(H) 184(H)  BUN 8 - 27 mg/dL 18 25(H) 26(H)  Creatinine 0.76 - 1.27 mg/dL 0.89 0.83 0.83  Sodium 134 - 144 mmol/L 138 136 134(L)  Potassium 3.5 - 5.2 mmol/L 4.5 3.8 3.8  Chloride 96 - 106 mmol/L 98 105 101  CO2 20 - 29 mmol/L _0 Calcium 8.6 - 10.2 mg/dL 9.2 7.8(L) 7.8(L)  Total Protein  6.5 - 8.1 g/dL - - -  Total Bilirubin 0.3 - 1.2 mg/dL - - -  Alkaline Phos 38 - 126 U/L - - -  AST 15 - 41 U/L - - -  ALT 0 - 44 U/L - - -       Assessment and Plan: 1. Type 2 diabetes mellitus with other neurologic complication, with long-term current use of insulin (HCC) Controlled with A1c of 5.7 which has trended down from 11.7 previously; goal is less than 7 He is now experiencing hyperglycemia hence able to decrease insulin dose from 26 units down to 22 units. Discussed management of hypoglycemia Neuropathy is controlled on gabapentin - insulin degludec (TRESIBA FLEXTOUCH) 100 UNIT/ML SOPN FlexTouch Pen; Inject 0.22 mLs (22 Units total) into the skin daily.  Dispense: 30 mL; Refill: 6  2. Dizziness Could be secondary to hypotension Discontinue amlodipine Advised to keep blood pressure log at home  3. Vitamin D deficiency He never picked this up from the pharmacy We will send a prescription to pharmacy again - ergocalciferol (DRISDOL) 1.25 MG (50000 UT) capsule; Take 1 capsule (50,000 Units total) by mouth once a week.  Dispense: 9 capsule; Refill: 1  4. Essential hypertension Controlled but now with hypotension Discontinue amlodipine, continue carvedilol and lisinopril Ambulatory blood pressure monitoring and he is to report to the clinic if blood pressure is above goal Counseled on blood pressure goal of less than 130/80, low-sodium, DASH diet, medication compliance, 150 minutes of moderate intensity exercise per week. Discussed medication compliance, adverse effects.  5.  Type 2 diabetes mellitus with hypoglycemia See #1 above  Follow Up Instructions: Return in about 3 months (around 11/16/2019) for Chronic medical conditions.    I discussed the assessment and treatment plan with the patient. The patient was provided an opportunity to ask questions and all were answered. The patient agreed with the plan and demonstrated an understanding of the instructions.   The  patient was advised to call back or seek an in-person evaluation if the symptoms worsen or if the condition fails to improve as anticipated.     I provided 16 minutes total of non-face-to-face time during this encounter including median intraservice time, reviewing previous notes, investigations, ordering medications, medical decision making, coordinating care and patient verbalized understanding at the end of the visit.     Charlott Rakes, MD, FAAFP. [facility] [place], [place]   08/19/2019, 1:38 PM

## 2019-08-20 ENCOUNTER — Encounter: Payer: Self-pay | Admitting: Family Medicine

## 2019-08-20 ENCOUNTER — Other Ambulatory Visit: Payer: Self-pay | Admitting: Pharmacist

## 2019-08-20 MED ORDER — PEN NEEDLES 32G X 4 MM MISC: 1.0000 [IU] | Freq: Every day | 2 refills | Status: DC

## 2019-08-20 MED FILL — $TRESIBA FLEXTOUCH 100 UNIT: 100 | 31 days supply | Qty: 9 | Fill #4

## 2019-09-01 ENCOUNTER — Other Ambulatory Visit: Payer: Self-pay | Admitting: Family Medicine

## 2019-09-01 DIAGNOSIS — I10 Essential (primary) hypertension: Secondary | ICD-10-CM

## 2019-09-01 MED FILL — LISINOPRIL 40 MG TABLET: 40 | 30 days supply | Qty: 30 | Fill #6

## 2019-09-01 MED FILL — ?CARVEDILOL 3.125 MG TABLET: 3.125 | 30 days supply | Qty: 60 | Fill #6

## 2019-09-01 MED FILL — ?ATORVASTATIN 40MG TABL: 40 | 30 days supply | Qty: 30 | Fill #6

## 2019-09-01 MED FILL — ?METFORMIN HCL 500MG TABLET: 500 | 30 days supply | Qty: 120 | Fill #3

## 2019-09-01 MED FILL — FUROSEMIDE 20 MG TABS: 20 | 30 days supply | Qty: 30 | Fill #0

## 2019-09-07 ENCOUNTER — Other Ambulatory Visit: Payer: Self-pay | Admitting: Family Medicine

## 2019-09-07 DIAGNOSIS — E1149 Type 2 diabetes mellitus with other diabetic neurological complication: Secondary | ICD-10-CM

## 2019-09-10 MED FILL — GABAPENTIN 300 MG CAPSULE: 300 | 30 days supply | Qty: 30 | Fill #0

## 2019-10-01 ENCOUNTER — Other Ambulatory Visit: Payer: Self-pay | Admitting: Family Medicine

## 2019-10-01 DIAGNOSIS — Z794 Long term (current) use of insulin: Secondary | ICD-10-CM

## 2019-10-01 DIAGNOSIS — E1165 Type 2 diabetes mellitus with hyperglycemia: Secondary | ICD-10-CM

## 2019-10-01 MED FILL — ?METFORMIN HCL 500MG TABLET: 500 | 30 days supply | Qty: 120 | Fill #4

## 2019-10-01 MED FILL — ?CARVEDILOL 3.125 MG TABLET: 3.125 | 30 days supply | Qty: 60 | Fill #0

## 2019-10-01 MED FILL — ?ATORVASTATIN 40MG TABLET: 40 | 30 days supply | Qty: 30 | Fill #0

## 2019-10-01 MED FILL — LISINOPRIL 40 MG TABLET: 40 | 30 days supply | Qty: 30 | Fill #0

## 2019-10-01 MED FILL — $TRESIBA FLEXTOUCH 100 UNIT: 100 | 31 days supply | Qty: 9 | Fill #5

## 2019-10-01 MED FILL — FUROSEMIDE 20 MG TABS: 20 | 30 days supply | Qty: 30 | Fill #1

## 2019-10-01 MED FILL — ?ERGOCALCIFEROL 50000 UNITC: 1.25 MG | 28 days supply | Qty: 4 | Fill #1

## 2019-10-15 MED FILL — GABAPENTIN 300 MG CAPSULE: 300 | 30 days supply | Qty: 30 | Fill #1

## 2019-11-10 ENCOUNTER — Encounter: Payer: Self-pay | Admitting: Family Medicine

## 2019-11-10 ENCOUNTER — Ambulatory Visit: Payer: Medicaid Other | Attending: Family Medicine | Admitting: Family Medicine

## 2019-11-10 ENCOUNTER — Other Ambulatory Visit: Payer: Self-pay | Admitting: Family Medicine

## 2019-11-10 ENCOUNTER — Other Ambulatory Visit: Payer: Self-pay

## 2019-11-10 VITALS — BP 178/85 | HR 66 | Ht 68.0 in | Wt 181.6 lb

## 2019-11-10 DIAGNOSIS — E1169 Type 2 diabetes mellitus with other specified complication: Secondary | ICD-10-CM | POA: Diagnosis not present

## 2019-11-10 DIAGNOSIS — I10 Essential (primary) hypertension: Secondary | ICD-10-CM | POA: Insufficient documentation

## 2019-11-10 DIAGNOSIS — Z794 Long term (current) use of insulin: Secondary | ICD-10-CM | POA: Insufficient documentation

## 2019-11-10 DIAGNOSIS — Z79899 Other long term (current) drug therapy: Secondary | ICD-10-CM | POA: Insufficient documentation

## 2019-11-10 DIAGNOSIS — E1140 Type 2 diabetes mellitus with diabetic neuropathy, unspecified: Secondary | ICD-10-CM | POA: Diagnosis not present

## 2019-11-10 DIAGNOSIS — E559 Vitamin D deficiency, unspecified: Secondary | ICD-10-CM | POA: Diagnosis not present

## 2019-11-10 DIAGNOSIS — E1149 Type 2 diabetes mellitus with other diabetic neurological complication: Secondary | ICD-10-CM

## 2019-11-10 LAB — POCT GLYCOSYLATED HEMOGLOBIN (HGB A1C): HbA1c, POC (prediabetic range): 5 % — AB (ref 5.7–6.4)

## 2019-11-10 LAB — GLUCOSE, POCT (MANUAL RESULT ENTRY): POC Glucose: 86 mg/dl (ref 70–99)

## 2019-11-10 MED ORDER — METFORMIN HCL 500 MG PO TABS: 1000.0000 mg | ORAL_TABLET | Freq: Two times a day (BID) | ORAL | 6 refills | Status: DC

## 2019-11-10 MED ORDER — LISINOPRIL 40 MG PO TABS: 40.0000 mg | ORAL_TABLET | Freq: Every day | ORAL | 6 refills | Status: DC

## 2019-11-10 MED ORDER — GABAPENTIN 300 MG PO CAPS: 300.0000 mg | ORAL_CAPSULE | Freq: Every day | ORAL | 6 refills | Status: DC

## 2019-11-10 MED ORDER — ATORVASTATIN CALCIUM 40 MG PO TABS: 40.0000 mg | ORAL_TABLET | Freq: Every day | ORAL | 6 refills | Status: DC

## 2019-11-10 MED ORDER — AMLODIPINE BESYLATE 5 MG PO TABS: 5.0000 mg | ORAL_TABLET | Freq: Every day | ORAL | 1 refills | Status: DC

## 2019-11-10 MED ORDER — CARVEDILOL 3.125 MG PO TABS: 3.1250 mg | ORAL_TABLET | Freq: Two times a day (BID) | ORAL | 6 refills | Status: DC

## 2019-11-10 MED ORDER — TRESIBA FLEXTOUCH 100 UNIT/ML ~~LOC~~ SOPN: 20.0000 [IU] | PEN_INJECTOR | Freq: Every day | SUBCUTANEOUS | 6 refills | Status: DC

## 2019-11-10 MED ORDER — FUROSEMIDE 20 MG PO TABS: 20.0000 mg | ORAL_TABLET | Freq: Every day | ORAL | 6 refills | Status: DC

## 2019-11-10 MED FILL — ?AMLODIPINE BESYLATE 5MG TA: 5 | 30 days supply | Qty: 30 | Fill #0

## 2019-11-10 NOTE — Patient Instructions (Signed)
Hypoglycemia Hypoglycemia is when the sugar (glucose) level in your blood is too low. Signs of low blood sugar may include:  Feeling: ? Hungry. ? Worried or nervous (anxious). ? Sweaty and clammy. ? Confused. ? Dizzy. ? Sleepy. ? Sick to your stomach (nauseous).  Having: ? A fast heartbeat. ? A headache. ? A change in your vision. ? Tingling or no feeling (numbness) around your mouth, lips, or tongue. ? Jerky movements that you cannot control (seizure).  Having trouble with: ? Moving (coordination). ? Sleeping. ? Passing out (fainting). ? Getting upset easily (irritability). Low blood sugar can happen to people who have diabetes and people who do not have diabetes. Low blood sugar can happen quickly, and it can be an emergency. Treating low blood sugar Low blood sugar is often treated by eating or drinking something sugary right away, such as:  Fruit juice, 4-6 oz (120-150 mL).  Regular soda (not diet soda), 4-6 oz (120-150 mL).  Low-fat milk, 4 oz (120 mL).  Several pieces of hard candy.  Sugar or honey, 1 Tbsp (15 mL). Treating low blood sugar if you have diabetes If you can think clearly and swallow safely, follow the 15:15 rule:  Take 15 grams of a fast-acting carb (carbohydrate). Talk with your doctor about how much you should take.  Always keep a source of fast-acting carb with you, such as: ? Sugar tablets (glucose pills). Take 3-4 pills. ? 6-8 pieces of hard candy. ? 4-6 oz (120-150 mL) of fruit juice. ? 4-6 oz (120-150 mL) of regular (not diet) soda. ? 1 Tbsp (15 mL) honey or sugar.  Check your blood sugar 15 minutes after you take the carb.  If your blood sugar is still at or below 70 mg/dL (3.9 mmol/L), take 15 grams of a carb again.  If your blood sugar does not go above 70 mg/dL (3.9 mmol/L) after 3 tries, get help right away.  After your blood sugar goes back to normal, eat a meal or a snack within 1 hour.  Treating very low blood sugar If your  blood sugar is at or below 54 mg/dL (3 mmol/L), you have very low blood sugar (severe hypoglycemia). This may also cause:  Passing out.  Jerky movements you cannot control (seizure).  Losing consciousness (coma). This is an emergency. Do not wait to see if the symptoms will go away. Get medical help right away. Call your local emergency services (911 in the [place] Do not drive yourself to the hospital. If you have very low blood sugar and you cannot eat or drink, you may need a glucagon shot (injection). A family member or friend should learn how to check your blood sugar and how to give you a glucagon shot. Ask your doctor if you need to have a glucagon shot kit at home. Follow these instructions at home: General instructions  Take over-the-counter and prescription medicines only as told by your doctor.  Stay aware of your blood sugar as told by your doctor.  Limit alcohol intake to no more than 1 drink a day for nonpregnant women and 2 drinks a day for men. One drink equals 12 oz of beer (355 mL), 5 oz of wine (148 mL), or 1 oz of hard liquor (44 mL).  Keep all follow-up visits as told by your doctor. This is important. If you have diabetes:   Follow your diabetes care plan as told by your doctor. Make sure you: ? Know the signs of low blood sugar. ?  Take your medicines as told. ? Follow your exercise and meal plan. ? Eat on time. Do not skip meals. ? Check your blood sugar as often as told by your doctor. Always check it before and after exercise. ? Follow your sick day plan when you cannot eat or drink normally. Make this plan ahead of time with your doctor.  Share your diabetes care plan with: ? Your work or school. ? People you live with.  Check your pee (urine) for ketones: ? When you are sick. ? As told by your doctor.  Carry a card or wear jewelry that says you have diabetes. Contact a doctor if:  You have trouble keeping your blood sugar in your target  range.  You have low blood sugar often. Get help right away if:  You still have symptoms after you eat or drink something sugary.  Your blood sugar is at or below 54 mg/dL (3 mmol/L).  You have jerky movements that you cannot control.  You pass out. These symptoms may be an emergency. Do not wait to see if the symptoms will go away. Get medical help right away. Call your local emergency services (911 in the [place] Do not drive yourself to the hospital. Summary  Hypoglycemia happens when the level of sugar (glucose) in your blood is too low.  Low blood sugar can happen to people who have diabetes and people who do not have diabetes. Low blood sugar can happen quickly, and it can be an emergency.  Make sure you know the signs of low blood sugar and know how to treat it.  Always keep a source of sugar (fast-acting carb) with you to treat low blood sugar. This information is not intended to replace advice given to you by your health care provider. Make sure you discuss any questions you have with your health care provider. Document Revised: 10/02/2018 Document Reviewed: 07/15/2015 Elsevier Patient Education  2020 Elsevier Inc.  

## 2019-11-10 NOTE — Progress Notes (Signed)
Subjective:  Patient ID: Frederick Moody, male    DOB: [birth year]  Age: 64 y.o. MRN: [id number]  CC: Diabetes   HPI Frederick Moody is a 64 year old male with a history of type 2 diabetes mellitus (A1c5.0), hypertension herefor follow-up visit. Amlodipine was discontinued at last visit due to hypotension but BP is elevated today and he endorses compliance with his antihypertensive.  His Frederick Moody was reduced from 26 to 22 units at his last visit due to hypoglycemia and he denies dizziness or falls which he had previously complained about. Blood sugars have been 84-120 fasting and random is around 214. He has no dyspnea, chest pain and tolerates the statin with no complaints of myalgias. He has no additional concerns today and is accompanied by his daughter for today's visit.  Past Medical History:  Diagnosis Date  . Diabetes mellitus without complication ([place])   . ETOH abuse     Past Surgical History:  Procedure Laterality Date  . APPENDECTOMY    . IR THORACENTESIS ASP PLEURAL SPACE W/IMG GUIDE  02/06/2019  . IRRIGATION AND DEBRIDEMENT ABSCESS N/A 01/13/2019   Procedure: IRRIGATION AND DEBRIDEMENT OF BACK ABSCESS;  Surgeon: Donnie Mesa, MD;  Location: [facility];  Service: General;  Laterality: N/A;    No family history on file.  No Known Allergies  Outpatient Medications Prior to Visit  Medication Sig Dispense Refill  . atorvastatin (LIPITOR) 40 MG tablet TAKE 1 TABLET (40 MG TOTAL) BY MOUTH DAILY. 30 tablet 2  . blood glucose meter kit and supplies Please check your blood sugars twice daily. Once in the morning and once before bed. 1 each 0  . carvedilol (COREG) 3.125 MG tablet Take 1 tablet (3.125 mg total) by mouth 2 (two) times daily with a meal. 60 tablet 6  . ergocalciferol (DRISDOL) 1.25 MG (50000 UT) capsule Take 1 capsule (50,000 Units total) by mouth once a week. 9 capsule 1  . furosemide (LASIX) 20 MG tablet TAKE 1 TABLET (20 MG TOTAL) BY MOUTH DAILY. 30 tablet 2   . gabapentin (NEURONTIN) 300 MG capsule TAKE 1 CAPSULE (300 MG TOTAL) BY MOUTH AT BEDTIME. 30 capsule 3  . insulin degludec (TRESIBA FLEXTOUCH) 100 UNIT/ML SOPN FlexTouch Pen Inject 0.22 mLs (22 Units total) into the skin daily. 30 mL 6  . Insulin Pen Needle (PEN NEEDLES) 32G X 4 MM MISC 1 Units by Does not apply route daily. 100 each 2  . lisinopril (ZESTRIL) 40 MG tablet Take 1 tablet (40 mg total) by mouth daily. 30 tablet 6  . metFORMIN (GLUCOPHAGE) 500 MG tablet Take 2 tablets (1,000 mg total) by mouth 2 (two) times daily with a meal. 120 tablet 6   No facility-administered medications prior to visit.     ROS Review of Systems  Constitutional: Negative for activity change and appetite change.  HENT: Negative for sinus pressure and sore throat.   Eyes: Negative for visual disturbance.  Respiratory: Negative for cough, chest tightness and shortness of breath.   Cardiovascular: Negative for chest pain and leg swelling.  Gastrointestinal: Negative for abdominal distention, abdominal pain, constipation and diarrhea.  Endocrine: Negative.   Genitourinary: Negative for dysuria.  Musculoskeletal: Negative for joint swelling and myalgias.  Skin: Negative for rash.  Allergic/Immunologic: Negative.   Neurological: Negative for weakness, light-headedness and numbness.  Psychiatric/Behavioral: Negative for dysphoric mood and suicidal ideas.    Objective:  BP (!) 178/85   Pulse 66   Ht 5' 8"  (1.727 m)   Wt 181 lb  9.6 oz (82.4 kg)   SpO2 100%   BMI 27.61 kg/m   BP/Weight 11/10/2019 05/13/2019 2/48/2500  Systolic BP 370 488 891  Diastolic BP 85 74 83  Wt. (Lbs) 181.6 160 166  BMI 27.61 24.33 25.24      Physical Exam Constitutional:      Appearance: He is well-developed.  Neck:     Vascular: No JVD.  Cardiovascular:     Rate and Rhythm: Normal rate.     Heart sounds: Normal heart sounds. No murmur.  Pulmonary:     Effort: Pulmonary effort is normal.     Breath sounds:  Normal breath sounds. No wheezing or rales.  Chest:     Chest wall: No tenderness.  Abdominal:     General: Bowel sounds are normal. There is no distension.     Palpations: Abdomen is soft. There is no mass.     Tenderness: There is no abdominal tenderness.  Musculoskeletal:        General: Normal range of motion.     Right lower leg: No edema.     Left lower leg: No edema.  Neurological:     Mental Status: He is alert and oriented to person, place, and time.  Psychiatric:        Mood and Affect: Mood normal.     CMP Latest Ref Rng & Units 03/10/2019 02/09/2019 02/08/2019  Glucose 65 - 99 mg/dL 139(H) 206(H) 184(H)  BUN 8 - 27 mg/dL 18 25(H) 26(H)  Creatinine 0.76 - 1.27 mg/dL 0.89 0.83 0.83  Sodium 134 - 144 mmol/L 138 136 134(L)  Potassium 3.5 - 5.2 mmol/L 4.5 3.8 3.8  Chloride 96 - 106 mmol/L 98 105 101  CO2 20 - 29 mmol/L 24 24 24   Calcium 8.6 - 10.2 mg/dL 9.2 7.8(L) 7.8(L)  Total Protein 6.5 - 8.1 g/dL - - -  Total Bilirubin 0.3 - 1.2 mg/dL - - -  Alkaline Phos 38 - 126 U/L - - -  AST 15 - 41 U/L - - -  ALT 0 - 44 U/L - - -    Lipid Panel  No results found for: CHOL, TRIG, HDL, CHOLHDL, VLDL, LDLCALC, LDLDIRECT  CBC    Component Value Date/Time   WBC 9.6 05/13/2019 1646   WBC 5.5 02/05/2019 0346   RBC 4.06 (L) 05/13/2019 1646   RBC 2.77 (L) 02/05/2019 0346   HGB 12.4 (L) 05/13/2019 1646   HCT 36.0 (L) 05/13/2019 1646   PLT 317 05/13/2019 1646   MCV 89 05/13/2019 1646   MCH 30.5 05/13/2019 1646   MCH 31.8 02/05/2019 0346   MCHC 34.4 05/13/2019 1646   MCHC 31.8 02/05/2019 0346   RDW 13.8 05/13/2019 1646   LYMPHSABS 3.5 (H) 05/13/2019 1646   MONOABS 0.4 02/05/2019 0346   EOSABS 0.4 05/13/2019 1646   BASOSABS 0.1 05/13/2019 1646    Lab Results  Component Value Date   HGBA1C 5.0 (A) 11/10/2019     Assessment & Plan:   1. Type 2 diabetes mellitus with other neurologic complication, with long-term current use of insulin (HCC) Diabetic neuropathy is  controlled - POCT glucose (manual entry) - POCT glycosylated hemoglobin (Hb A1C) - amLODipine (NORVASC) 5 MG tablet; Take 1 tablet (5 mg total) by mouth daily.  Dispense: 90 tablet; Refill: 1 - gabapentin (NEURONTIN) 300 MG capsule; Take 1 capsule (300 mg total) by mouth at bedtime.  Dispense: 30 capsule; Refill: 6 - insulin degludec (TRESIBA FLEXTOUCH) 100 UNIT/ML FlexTouch Pen; Inject  0.2 mLs (20 Units total) into the skin daily.  Dispense: 30 mL; Refill: 6 - CMP14+EGFR - Microalbumin / creatinine urine ratio  2. Type 2 diabetes mellitus with other specified complication, with long-term current use of insulin (HCC) Controlled with A1c of 5.0; goal is <7.0 Decrease Tresiba from 22 to 20 units to prevent hypoglycemia Blood sugar log is negative for hypoglycemia Provided information on hypoglycemia management Counseled on Diabetic diet, my plate method, 675 minutes of moderate intensity exercise/week Blood sugar logs with fasting goals of 80-120 mg/dl, random of less than 180 and in the event of sugars less than 60 mg/dl or greater than 400 mg/dl encouraged to notify the clinic. Advised on the need for annual eye exams, annual foot exams, Pneumonia vaccine. - atorvastatin (LIPITOR) 40 MG tablet; Take 1 tablet (40 mg total) by mouth daily.  Dispense: 30 tablet; Refill: 6 - metFORMIN (GLUCOPHAGE) 500 MG tablet; Take 2 tablets (1,000 mg total) by mouth 2 (two) times daily with a meal.  Dispense: 120 tablet; Refill: 6  3. Hypertension, unspecified type Uncontrolled Restart Amlodipine but at a lower dose of 65m Counseled on blood pressure goal of less than 130/80, low-sodium, DASH diet, medication compliance, 150 minutes of moderate intensity exercise per week. Discussed medication compliance, adverse effects. - carvedilol (COREG) 3.125 MG tablet; Take 1 tablet (3.125 mg total) by mouth 2 (two) times daily with a meal.  Dispense: 60 tablet; Refill: 6 - furosemide (LASIX) 20 MG tablet; Take 1  tablet (20 mg total) by mouth daily.  Dispense: 30 tablet; Refill: 6 - lisinopril (ZESTRIL) 40 MG tablet; Take 1 tablet (40 mg total) by mouth daily.  Dispense: 30 tablet; Refill: 6  4. Vitamin D deficiency Previous Vitamin D deficiency Will repeat level - VITAMIN D 25 Hydroxy (Vit-D Deficiency, Fractures)   Return in about 6 months (around 05/12/2020) for chronic disease management.   ECharlott Rakes MD, FAAFP. [facility]  11/10/2019, 3:14 PM

## 2019-11-11 LAB — CMP14+EGFR
ALT: 14 IU/L (ref 0–44)
AST: 29 IU/L (ref 0–40)
Albumin/Globulin Ratio: 1.9 (ref 1.2–2.2)
Albumin: 4.3 g/dL (ref 3.8–4.8)
Alkaline Phosphatase: 45 IU/L — ABNORMAL LOW (ref 48–121)
BUN/Creatinine Ratio: 14 (ref 10–24)
BUN: 10 mg/dL (ref 8–27)
Bilirubin Total: 0.6 mg/dL (ref 0.0–1.2)
CO2: 25 mmol/L (ref 20–29)
Calcium: 9.9 mg/dL (ref 8.6–10.2)
Chloride: 95 mmol/L — ABNORMAL LOW (ref 96–106)
Creatinine, Ser: 0.74 mg/dL — ABNORMAL LOW (ref 0.76–1.27)
GFR calc Af Amer: 113 mL/min/{1.73_m2} (ref >59)
GFR calc non Af Amer: 97 mL/min/{1.73_m2} (ref >59)
Globulin, Total: 2.3 g/dL (ref 1.5–4.5)
Glucose: 78 mg/dL (ref 65–99)
Potassium: 5.1 mmol/L (ref 3.5–5.2)
Sodium: 133 mmol/L — ABNORMAL LOW (ref 134–144)
Total Protein: 6.6 g/dL (ref 6.0–8.5)

## 2019-11-11 LAB — VITAMIN D 25 HYDROXY (VIT D DEFICIENCY, FRACTURES): Vit D, 25-Hydroxy: 58.4 ng/mL (ref 30.0–100.0)

## 2019-11-12 MED FILL — GABAPENTIN 300 MG CAPSULE: 300 | 30 days supply | Qty: 30 | Fill #0

## 2019-11-24 ENCOUNTER — Ambulatory Visit: Payer: Self-pay | Admitting: Family Medicine

## 2019-11-25 ENCOUNTER — Ambulatory Visit: Payer: Self-pay | Admitting: Family Medicine

## 2019-12-02 MED FILL — !TRESIBA FLEXTOUCH 100 UNIT: 100/ML (3) | 31 days supply | Qty: 9 | Fill #7

## 2019-12-02 MED FILL — ?CARVEDILOL 3.125 MG TABLET: 3.125 | 30 days supply | Qty: 60 | Fill #0

## 2019-12-14 MED FILL — VIT D2 1.25 MG (50,000 UNIT: 1.25 MG | 42 days supply | Qty: 6 | Fill #3

## 2019-12-14 MED FILL — GABAPENTIN 300 MG CAPSULE: 300 | 30 days supply | Qty: 30 | Fill #1

## 2020-01-01 MED FILL — LISINOPRIL 40 MG TABLET: 40 | 30 days supply | Qty: 30 | Fill #1

## 2020-01-01 MED FILL — !TRESIBA FLEXTOUCH 100 UNIT: 100/ML (3) | 31 days supply | Qty: 9 | Fill #8

## 2020-01-01 MED FILL — ?AMLODIPINE BESYLATE 5MG TA: 5 | 30 days supply | Qty: 30 | Fill #1

## 2020-01-01 MED FILL — ?CARVEDILOL 3.125 MG TABLET: 3.125 | 30 days supply | Qty: 60 | Fill #1

## 2020-01-01 MED FILL — ?FUROSEMIDE 20MG TABLET: 20 | 30 days supply | Qty: 30 | Fill #1

## 2020-01-01 MED FILL — ?ATORVASTATIN 40MG TABLET: 40 | 30 days supply | Qty: 30 | Fill #1

## 2020-01-01 MED FILL — METFORMIN HCL 500 MG TABS: 500 | 30 days supply | Qty: 120 | Fill #1

## 2020-01-07 ENCOUNTER — Telehealth: Payer: Self-pay | Admitting: Family Medicine

## 2020-01-07 NOTE — Telephone Encounter (Signed)
Please follow up  Copied from CRM [id number]. Topic: General - Other >> Jan 07, 2020  1:40 PM Dalphine Handing A wrote: Paitents family wanted to clarify if patient is to continue taking amlodipine. Please advise

## 2020-01-07 NOTE — Telephone Encounter (Signed)
Patient's wife was called and informed that patient is to be taking 5mg  Amlodipine.

## 2020-01-14 MED FILL — ?ERGOCALCIFEROL 50000 UNITC: 1.25 MG | 27 days supply | Qty: 4 | Fill #0

## 2020-01-14 MED FILL — GABAPENTIN 300 MG CAPSULE: 300 | 30 days supply | Qty: 30 | Fill #2

## 2020-01-29 MED FILL — ?AMLODIPINE BESYLATE 5MG TA: 5 | 30 days supply | Qty: 30 | Fill #2

## 2020-01-29 MED FILL — ?FUROSEMIDE 20MG TABLET: 20 | 30 days supply | Qty: 30 | Fill #2

## 2020-01-29 MED FILL — ?ATORVASTATIN 40MG TABLET: 40 | 30 days supply | Qty: 30 | Fill #2

## 2020-01-29 MED FILL — METFORMIN HCL 500 MG TABS: 500 | 30 days supply | Qty: 120 | Fill #2

## 2020-01-29 MED FILL — LISINOPRIL 40 MG TABLET: 40 | 30 days supply | Qty: 30 | Fill #2

## 2020-01-29 MED FILL — ?CARVEDILOL 3.125 MG TABLET: 3.125 | 30 days supply | Qty: 60 | Fill #2

## 2020-01-29 MED FILL — !TRESIBA FLEXTOUCH 100 UNIT: 100/ML (3) | 31 days supply | Qty: 9 | Fill #9

## 2020-02-11 MED FILL — GABAPENTIN 300 MG CAPSULE: 300 | 30 days supply | Qty: 30 | Fill #3

## 2020-02-11 MED FILL — VIT D2 1.25 MG (50,000 UNIT: 1.25 MG | 27 days supply | Qty: 4 | Fill #1

## 2020-03-01 MED FILL — AMLODIPINE BESYLATE 5 MG TA: 5 | 30 days supply | Qty: 30 | Fill #3

## 2020-03-01 MED FILL — METFORMIN HCL 500 MG TABS: 500 | 30 days supply | Qty: 120 | Fill #3

## 2020-03-01 MED FILL — ?ATORVASTATIN 40MG TABLET: 40 | 30 days supply | Qty: 30 | Fill #3

## 2020-03-01 MED FILL — ?FUROSEMIDE 20MG TABLET: 20 | 30 days supply | Qty: 30 | Fill #3

## 2020-03-01 MED FILL — ?CARVEDILOL 3.125 MG TABLET: 3.125 | 30 days supply | Qty: 60 | Fill #3

## 2020-03-01 MED FILL — LISINOPRIL 40 MG TABLET: 40 | 30 days supply | Qty: 30 | Fill #3

## 2020-03-11 MED FILL — GABAPENTIN 300 MG CAPSULE: 300 | 30 days supply | Qty: 30 | Fill #4

## 2020-03-11 MED FILL — VIT D2 1.25 MG (50,000 UNIT: 1.25 MG | 27 days supply | Qty: 4 | Fill #2

## 2020-04-06 ENCOUNTER — Other Ambulatory Visit: Payer: Self-pay | Admitting: Family Medicine

## 2020-04-06 DIAGNOSIS — Z794 Long term (current) use of insulin: Secondary | ICD-10-CM

## 2020-04-06 DIAGNOSIS — E1149 Type 2 diabetes mellitus with other diabetic neurological complication: Secondary | ICD-10-CM

## 2020-04-06 MED FILL — METFORMIN HCL 500 MG TABS: 500 | 30 days supply | Qty: 120 | Fill #4

## 2020-04-06 MED FILL — TRUEplus 5-BEVEL PEN NEEDLE: 32G X 4 MM | 25 days supply | Qty: 100 | Fill #0

## 2020-04-06 MED FILL — GABAPENTIN 300 MG CAPSULE: 300 | 30 days supply | Qty: 30 | Fill #5

## 2020-04-06 MED FILL — TRESIBA FLEXTOUCH 100 UNITS: 100 | 30 days supply | Qty: 6 | Fill #0

## 2020-04-06 MED FILL — VIT D2 1.25 MG (50,000 UNIT: 1.25 MG | 27 days supply | Qty: 4 | Fill #3

## 2020-04-06 MED FILL — AMLODIPINE BESYLATE 5 MG TA: 5 | 30 days supply | Qty: 30 | Fill #4

## 2020-04-06 MED FILL — LISINOPRIL 40 MG TABLET: 40 | 30 days supply | Qty: 30 | Fill #4

## 2020-04-06 MED FILL — ?ATORVASTATIN 40MG TABLET: 40 | 30 days supply | Qty: 30 | Fill #4

## 2020-04-06 MED FILL — ?CARVEDILOL 3.125 MG TABLET: 3.125 | 30 days supply | Qty: 60 | Fill #4

## 2020-04-06 NOTE — Telephone Encounter (Signed)
Sent message pt. Needs a visit next month.

## 2020-04-07 MED FILL — FUROSEMIDE 20 MG TABS: 20 | 30 days supply | Qty: 30 | Fill #4

## 2020-05-04 MED FILL — METFORMIN HCL 500 MG TABS: 500 | 30 days supply | Qty: 120 | Fill #5

## 2020-05-04 MED FILL — AMLODIPINE BESYLATE 5 MG TA: 5 | 30 days supply | Qty: 30 | Fill #5

## 2020-05-04 MED FILL — GABAPENTIN 300 MG CAPSULE: 300 | 30 days supply | Qty: 30 | Fill #6

## 2020-05-04 MED FILL — ?ATORVASTATIN 40MG TABLET: 40 | 30 days supply | Qty: 30 | Fill #5

## 2020-05-04 MED FILL — VIT D2 1.25 MG (50,000 UNIT: 1.25 MG | 13 days supply | Qty: 2 | Fill #4

## 2020-05-04 MED FILL — ?CARVEDILOL 3.125 MG TABLET: 3.125 | 30 days supply | Qty: 60 | Fill #5

## 2020-05-04 MED FILL — FUROSEMIDE 20 MG TABS: 20 | 30 days supply | Qty: 30 | Fill #5

## 2020-05-04 MED FILL — LISINOPRIL 40 MG TABLET: 40 | 30 days supply | Qty: 30 | Fill #5

## 2020-05-04 MED FILL — TRUEplus 5-BEVEL PEN NEEDLE: 32G X 4 MM | 25 days supply | Qty: 100 | Fill #1

## 2020-05-26 ENCOUNTER — Encounter: Payer: Self-pay | Admitting: Family Medicine

## 2020-05-26 ENCOUNTER — Ambulatory Visit: Payer: Medicare Other | Attending: Family Medicine | Admitting: Family Medicine

## 2020-05-26 ENCOUNTER — Other Ambulatory Visit: Payer: Self-pay

## 2020-05-26 ENCOUNTER — Other Ambulatory Visit: Payer: Self-pay | Admitting: Family Medicine

## 2020-05-26 VITALS — BP 181/82 | HR 70 | Ht 68.0 in | Wt 189.0 lb

## 2020-05-26 DIAGNOSIS — E1149 Type 2 diabetes mellitus with other diabetic neurological complication: Secondary | ICD-10-CM | POA: Diagnosis not present

## 2020-05-26 DIAGNOSIS — Z9049 Acquired absence of other specified parts of digestive tract: Secondary | ICD-10-CM | POA: Diagnosis not present

## 2020-05-26 DIAGNOSIS — Z794 Long term (current) use of insulin: Secondary | ICD-10-CM

## 2020-05-26 DIAGNOSIS — Z79899 Other long term (current) drug therapy: Secondary | ICD-10-CM | POA: Diagnosis not present

## 2020-05-26 DIAGNOSIS — E1169 Type 2 diabetes mellitus with other specified complication: Secondary | ICD-10-CM | POA: Diagnosis not present

## 2020-05-26 DIAGNOSIS — I10 Essential (primary) hypertension: Secondary | ICD-10-CM | POA: Insufficient documentation

## 2020-05-26 DIAGNOSIS — Z7984 Long term (current) use of oral hypoglycemic drugs: Secondary | ICD-10-CM | POA: Diagnosis not present

## 2020-05-26 DIAGNOSIS — R42 Dizziness and giddiness: Secondary | ICD-10-CM | POA: Diagnosis not present

## 2020-05-26 DIAGNOSIS — I152 Hypertension secondary to endocrine disorders: Secondary | ICD-10-CM

## 2020-05-26 DIAGNOSIS — E1159 Type 2 diabetes mellitus with other circulatory complications: Secondary | ICD-10-CM | POA: Diagnosis present

## 2020-05-26 DIAGNOSIS — E1140 Type 2 diabetes mellitus with diabetic neuropathy, unspecified: Secondary | ICD-10-CM | POA: Diagnosis not present

## 2020-05-26 LAB — POCT GLYCOSYLATED HEMOGLOBIN (HGB A1C): HbA1c, POC (controlled diabetic range): 5.5 % (ref 0.0–7.0)

## 2020-05-26 LAB — GLUCOSE, POCT (MANUAL RESULT ENTRY): POC Glucose: 123 mg/dl — AB (ref 70–99)

## 2020-05-26 MED ORDER — METFORMIN HCL 500 MG PO TABS: 1000.0000 mg | ORAL_TABLET | Freq: Two times a day (BID) | ORAL | 6 refills | Status: DC

## 2020-05-26 MED ORDER — ATORVASTATIN CALCIUM 40 MG PO TABS: 40.0000 mg | ORAL_TABLET | Freq: Every day | ORAL | 6 refills | Status: DC

## 2020-05-26 MED ORDER — AMLODIPINE BESYLATE 10 MG PO TABS: 10.0000 mg | ORAL_TABLET | Freq: Every day | ORAL | 6 refills | Status: DC

## 2020-05-26 MED ORDER — TRESIBA FLEXTOUCH 100 UNIT/ML ~~LOC~~ SOPN: 18.0000 [IU] | PEN_INJECTOR | Freq: Every day | SUBCUTANEOUS | 2 refills | Status: DC

## 2020-05-26 MED ORDER — MECLIZINE HCL 25 MG PO TABS: 25.0000 mg | ORAL_TABLET | Freq: Three times a day (TID) | ORAL | 1 refills | Status: DC | PRN

## 2020-05-26 MED ORDER — LISINOPRIL 40 MG PO TABS: 40.0000 mg | ORAL_TABLET | Freq: Every day | ORAL | 6 refills | Status: DC

## 2020-05-26 MED ORDER — GABAPENTIN 300 MG PO CAPS: 600.0000 mg | ORAL_CAPSULE | Freq: Every day | ORAL | 6 refills | Status: DC

## 2020-05-26 MED ORDER — CARVEDILOL 3.125 MG PO TABS: 3.1250 mg | ORAL_TABLET | Freq: Two times a day (BID) | ORAL | 6 refills | Status: DC

## 2020-05-26 MED FILL — MECLIZINE 25 MG TABLET: 25 | 10 days supply | Qty: 30 | Fill #0

## 2020-05-26 MED FILL — CARVEDILOL 3.125 MG TABLET: 3.125 | 30 days supply | Qty: 60 | Fill #0

## 2020-05-26 MED FILL — TRESIBA FLEXTOUCH 100 UNITS: 100 | 33 days supply | Qty: 6 | Fill #0

## 2020-05-26 NOTE — Progress Notes (Signed)
Subjective:  Patient ID: Frederick Moody, male    DOB: [birth year]  Age: 64 y.o. MRN: [id number]  CC: Diabetes   HPI Frederick Moody  is a 64 year old male with a history of type 2 diabetes mellitus (A1c5.5), hypertension herefor a follow-up visit. He has to stand still every time he gets up and he feels the room spinning as well.  He has had occasions when he got up to go to the bathroom and fell as a result. I have reviewed his blood sugar log which have been negative for hypoglycemia and fasting sugars range between 90 and 120 however the last 3 days he has had fasting sugars of 165 and 170-week no change to his regimen.  His Tyler Aas was decreased by 2 units at his last visit due to an A1c of 5.0. His blood pressure is elevated today and amlodipine had been restarted at his last visit.  He endorses compliance with his antihypertensives.  Past Medical History:  Diagnosis Date  . Diabetes mellitus without complication ([place])   . ETOH abuse     Past Surgical History:  Procedure Laterality Date  . APPENDECTOMY    . IR THORACENTESIS ASP PLEURAL SPACE W/IMG GUIDE  02/06/2019  . IRRIGATION AND DEBRIDEMENT ABSCESS N/A 01/13/2019   Procedure: IRRIGATION AND DEBRIDEMENT OF BACK ABSCESS;  Surgeon: Donnie Mesa, MD;  Location: [facility];  Service: General;  Laterality: N/A;    History reviewed. No pertinent family history.  No Known Allergies  Outpatient Medications Prior to Visit  Medication Sig Dispense Refill  . blood glucose meter kit and supplies Please check your blood sugars twice daily. Once in the morning and once before bed. 1 each 0  . ergocalciferol (DRISDOL) 1.25 MG (50000 UT) capsule Take 1 capsule (50,000 Units total) by mouth once a week. 9 capsule 1  . furosemide (LASIX) 20 MG tablet Take 1 tablet (20 mg total) by mouth daily. 30 tablet 6  . Insulin Pen Needle (PEN NEEDLES) 32G X 4 MM MISC 1 Units by Does not apply route daily. 100 each 2  . amLODipine (NORVASC) 5 MG  tablet Take 1 tablet (5 mg total) by mouth daily. 90 tablet 1  . atorvastatin (LIPITOR) 40 MG tablet Take 1 tablet (40 mg total) by mouth daily. 30 tablet 6  . carvedilol (COREG) 3.125 MG tablet Take 1 tablet (3.125 mg total) by mouth 2 (two) times daily with a meal. 60 tablet 6  . gabapentin (NEURONTIN) 300 MG capsule Take 1 capsule (300 mg total) by mouth at bedtime. 30 capsule 6  . insulin degludec (TRESIBA FLEXTOUCH) 100 UNIT/ML FlexTouch Pen Inject 0.2 mLs (20 Units total) into the skin daily. 9 mL 2  . lisinopril (ZESTRIL) 40 MG tablet Take 1 tablet (40 mg total) by mouth daily. 30 tablet 6  . metFORMIN (GLUCOPHAGE) 500 MG tablet Take 2 tablets (1,000 mg total) by mouth 2 (two) times daily with a meal. 120 tablet 6   No facility-administered medications prior to visit.     ROS Review of Systems  Constitutional: Negative for activity change and appetite change.  HENT: Negative for sinus pressure and sore throat.   Eyes: Negative for visual disturbance.  Respiratory: Negative for cough, chest tightness and shortness of breath.   Cardiovascular: Negative for chest pain and leg swelling.  Gastrointestinal: Negative for abdominal distention, abdominal pain, constipation and diarrhea.  Endocrine: Negative.   Genitourinary: Negative for dysuria.  Musculoskeletal: Negative for joint swelling and myalgias.  Skin: Negative  for rash.  Allergic/Immunologic: Negative.   Neurological: Positive for light-headedness. Negative for weakness and numbness.  Psychiatric/Behavioral: Negative for dysphoric mood and suicidal ideas.    Objective:  BP (!) 181/82   Pulse 70   Ht 5' 8"  (1.727 m)   Wt 189 lb (85.7 kg)   SpO2 99%   BMI 28.74 kg/m   BP/Weight 05/26/2020 11/10/2019 71/69/6789  Systolic BP 381 017 510  Diastolic BP 82 85 74  Wt. (Lbs) 189 181.6 160  BMI 28.74 27.61 24.33      Physical Exam Constitutional:      Appearance: He is well-developed.  Neck:     Vascular: No JVD.   Cardiovascular:     Rate and Rhythm: Normal rate.     Heart sounds: Normal heart sounds. No murmur heard.   Pulmonary:     Effort: Pulmonary effort is normal.     Breath sounds: Normal breath sounds. No wheezing or rales.  Chest:     Chest wall: No tenderness.  Abdominal:     General: Bowel sounds are normal. There is no distension.     Palpations: Abdomen is soft. There is no mass.     Tenderness: There is no abdominal tenderness.  Musculoskeletal:        General: Normal range of motion.     Right lower leg: No edema.     Left lower leg: No edema.  Neurological:     Mental Status: He is alert and oriented to person, place, and time.  Psychiatric:        Mood and Affect: Mood normal.     CMP Latest Ref Rng & Units 11/10/2019 03/10/2019 02/09/2019  Glucose 65 - 99 mg/dL 78 139(H) 206(H)  BUN 8 - 27 mg/dL 10 18 25(H)  Creatinine 0.76 - 1.27 mg/dL 0.74(L) 0.89 0.83  Sodium 134 - 144 mmol/L 133(L) 138 136  Potassium 3.5 - 5.2 mmol/L 5.1 4.5 3.8  Chloride 96 - 106 mmol/L 95(L) 98 105  CO2 20 - 29 mmol/L 25 24 24   Calcium 8.6 - 10.2 mg/dL 9.9 9.2 7.8(L)  Total Protein 6.0 - 8.5 g/dL 6.6 - -  Total Bilirubin 0.0 - 1.2 mg/dL 0.6 - -  Alkaline Phos 48 - 121 IU/L 45(L) - -  AST 0 - 40 IU/L 29 - -  ALT 0 - 44 IU/L 14 - -    Lipid Panel  No results found for: CHOL, TRIG, HDL, CHOLHDL, VLDL, LDLCALC, LDLDIRECT  CBC    Component Value Date/Time   WBC 9.6 05/13/2019 1646   WBC 5.5 02/05/2019 0346   RBC 4.06 (L) 05/13/2019 1646   RBC 2.77 (L) 02/05/2019 0346   HGB 12.4 (L) 05/13/2019 1646   HCT 36.0 (L) 05/13/2019 1646   PLT 317 05/13/2019 1646   MCV 89 05/13/2019 1646   MCH 30.5 05/13/2019 1646   MCH 31.8 02/05/2019 0346   MCHC 34.4 05/13/2019 1646   MCHC 31.8 02/05/2019 0346   RDW 13.8 05/13/2019 1646   LYMPHSABS 3.5 (H) 05/13/2019 1646   MONOABS 0.4 02/05/2019 0346   EOSABS 0.4 05/13/2019 1646   BASOSABS 0.1 05/13/2019 1646    Lab Results  Component Value Date    HGBA1C 5.5 05/26/2020    Assessment & Plan:  1. Type 2 diabetes mellitus with other neurologic complication, with long-term current use of insulin (HCC) Neuropathy is uncontrolled Increase gabapentin dose - POCT glucose (manual entry) - Basic Metabolic Panel - insulin degludec (TRESIBA FLEXTOUCH) 100 UNIT/ML  FlexTouch Pen; Inject 18 Units into the skin daily.  Dispense: 9 mL; Refill: 2 - Lipid panel - CMP14+EGFR - metFORMIN (GLUCOPHAGE) 500 MG tablet; Take 2 tablets (1,000 mg total) by mouth 2 (two) times daily with a meal.  Dispense: 120 tablet; Refill: 6 - gabapentin (NEURONTIN) 300 MG capsule; Take 2 capsules (600 mg total) by mouth at bedtime.  Dispense: 60 capsule; Refill: 6  2. Vertigo Advised to change positions slowly Trial of meclizine If symptoms persist will refer for vestibular rehab - meclizine (ANTIVERT) 25 MG tablet; Take 1 tablet (25 mg total) by mouth 3 (three) times daily as needed for dizziness.  Dispense: 30 tablet; Refill: 1  3. Type 2 diabetes mellitus with other specified complication, with long-term current use of insulin (HCC) Controlled with A1c of 5.5 Decreased dose of Tresiba to prevent hypoglycemia Counseled on Diabetic diet, my plate method, 856 minutes of moderate intensity exercise/week Blood sugar logs with fasting goals of 80-120 mg/dl, random of less than 180 and in the event of sugars less than 60 mg/dl or greater than 400 mg/dl encouraged to notify the clinic. Advised on the need for annual eye exams, annual foot exams, Pneumonia vaccine. - POCT glycosylated hemoglobin (Hb D1S) - Basic Metabolic Panel - insulin degludec (TRESIBA FLEXTOUCH) 100 UNIT/ML FlexTouch Pen; Inject 18 Units into the skin daily.  Dispense: 9 mL; Refill: 2 - metFORMIN (GLUCOPHAGE) 500 MG tablet; Take 2 tablets (1,000 mg total) by mouth 2 (two) times daily with a meal.  Dispense: 120 tablet; Refill: 6 - atorvastatin (LIPITOR) 40 MG tablet; Take 1 tablet (40 mg total) by mouth  daily.  Dispense: 30 tablet; Refill: 6  4. Hypertension associated with diabetes ([place]) Uncontrolled Increase amlodipine dose Counseled on blood pressure goal of less than 130/80, low-sodium, DASH diet, medication compliance, 150 minutes of moderate intensity exercise per week. Discussed medication compliance, adverse effects. - amLODipine (NORVASC) 10 MG tablet; Take 1 tablet (10 mg total) by mouth daily.  Dispense: 30 tablet; Refill: 6 - lisinopril (ZESTRIL) 40 MG tablet; Take 1 tablet (40 mg total) by mouth daily.  Dispense: 30 tablet; Refill: 6 - carvedilol (COREG) 3.125 MG tablet; Take 1 tablet (3.125 mg total) by mouth 2 (two) times daily with a meal.  Dispense: 60 tablet; Refill: 6    Meds ordered this encounter  Medications  . amLODipine (NORVASC) 10 MG tablet    Sig: Take 1 tablet (10 mg total) by mouth daily.    Dispense:  30 tablet    Refill:  6    Dose increase  . insulin degludec (TRESIBA FLEXTOUCH) 100 UNIT/ML FlexTouch Pen    Sig: Inject 18 Units into the skin daily.    Dispense:  9 mL    Refill:  2    Pt. Needs a visit next month.  . meclizine (ANTIVERT) 25 MG tablet    Sig: Take 1 tablet (25 mg total) by mouth 3 (three) times daily as needed for dizziness.    Dispense:  30 tablet    Refill:  1  . metFORMIN (GLUCOPHAGE) 500 MG tablet    Sig: Take 2 tablets (1,000 mg total) by mouth 2 (two) times daily with a meal.    Dispense:  120 tablet    Refill:  6  . lisinopril (ZESTRIL) 40 MG tablet    Sig: Take 1 tablet (40 mg total) by mouth daily.    Dispense:  30 tablet    Refill:  6  . gabapentin (NEURONTIN) 300  MG capsule    Sig: Take 2 capsules (600 mg total) by mouth at bedtime.    Dispense:  60 capsule    Refill:  6  . carvedilol (COREG) 3.125 MG tablet    Sig: Take 1 tablet (3.125 mg total) by mouth 2 (two) times daily with a meal.    Dispense:  60 tablet    Refill:  6  . atorvastatin (LIPITOR) 40 MG tablet    Sig: Take 1 tablet (40 mg total) by mouth  daily.    Dispense:  30 tablet    Refill:  6    Follow-up: Return in about 3 months (around 08/24/2020) for Chronic disease management.       Charlott Rakes, MD, FAAFP. [facility] [place], [place]   05/26/2020, 12:11 PM

## 2020-05-26 NOTE — Patient Instructions (Signed)

## 2020-05-26 NOTE — Progress Notes (Signed)
Still having weak and dizzy spells.

## 2020-05-27 ENCOUNTER — Telehealth: Payer: Self-pay

## 2020-05-27 LAB — CMP14+EGFR
ALT: 17 IU/L (ref 0–44)
AST: 21 IU/L (ref 0–40)
Albumin/Globulin Ratio: 1.6 (ref 1.2–2.2)
Albumin: 4.2 g/dL (ref 3.8–4.8)
Alkaline Phosphatase: 40 IU/L — ABNORMAL LOW (ref 44–121)
BUN/Creatinine Ratio: 16 (ref 10–24)
BUN: 14 mg/dL (ref 8–27)
Bilirubin Total: 0.5 mg/dL (ref 0.0–1.2)
CO2: 25 mmol/L (ref 20–29)
Calcium: 9.8 mg/dL (ref 8.6–10.2)
Chloride: 99 mmol/L (ref 96–106)
Creatinine, Ser: 0.85 mg/dL (ref 0.76–1.27)
GFR calc Af Amer: 106 mL/min/{1.73_m2} (ref >59)
GFR calc non Af Amer: 92 mL/min/{1.73_m2} (ref >59)
Globulin, Total: 2.6 g/dL (ref 1.5–4.5)
Glucose: 104 mg/dL — ABNORMAL HIGH (ref 65–99)
Potassium: 4.9 mmol/L (ref 3.5–5.2)
Sodium: 138 mmol/L (ref 134–144)
Total Protein: 6.8 g/dL (ref 6.0–8.5)

## 2020-05-27 LAB — LIPID PANEL
Chol/HDL Ratio: 2.4 ratio (ref 0.0–5.0)
Cholesterol, Total: 132 mg/dL (ref 100–199)
HDL: 54 mg/dL (ref >39)
LDL Chol Calc (NIH): 65 mg/dL (ref 0–99)
Triglycerides: 63 mg/dL (ref 0–149)
VLDL Cholesterol Cal: 13 mg/dL (ref 5–40)

## 2020-05-27 NOTE — Telephone Encounter (Signed)
-----   Message from Hoy Register, MD sent at 05/27/2020  9:54 AM EST ----- Please inform the patient that labs are normal. Thank you.

## 2020-05-27 NOTE — Telephone Encounter (Signed)
Pt's sister was given lab results for patient.

## 2020-05-30 MED FILL — AMLODIPINE BESYLATE 10 MG T: 10 | 30 days supply | Qty: 30 | Fill #0

## 2020-06-01 ENCOUNTER — Other Ambulatory Visit: Payer: Self-pay | Admitting: Family Medicine

## 2020-06-01 DIAGNOSIS — E559 Vitamin D deficiency, unspecified: Secondary | ICD-10-CM

## 2020-06-01 MED FILL — METFORMIN HCL 500 MG TABS: 500 | 30 days supply | Qty: 120 | Fill #6

## 2020-06-01 MED FILL — ATORVASTATIN CALCIUM 40 MG: 40 | 30 days supply | Qty: 30 | Fill #0

## 2020-06-01 MED FILL — FUROSEMIDE 20 MG TABS: 20 | 30 days supply | Qty: 30 | Fill #6

## 2020-06-01 MED FILL — GABAPENTIN 300 MG CAPSULE: 300 | 30 days supply | Qty: 60 | Fill #0

## 2020-06-01 MED FILL — LISINOPRIL 40 MG TABLET: 40 | 30 days supply | Qty: 30 | Fill #6

## 2020-06-01 NOTE — Telephone Encounter (Signed)
Requested medication (s) are due for refill today: yes   Requested medication (s) are on the active medication list:yes   Last refill:  05/04/2020  Future visit scheduled: no  Notes to clinic:  this refill cannot be delegated    Requested Prescriptions  Pending Prescriptions Disp Refills   Vitamin D, Ergocalciferol, (DRISDOL) 1.25 MG (50000 UNIT) CAPS capsule [Pharmacy Med Name: VIT D2 1.25 MG (50,000 UNIT 1.25 MG Capsule] 2 capsule 1    Sig: TAKE 1 CAPSULE (50,000 UNITS TOTAL) BY MOUTH ONCE A WEEK.      Endocrinology:  Vitamins - Vitamin D Supplementation Failed - 06/01/2020 10:05 AM      Failed - 50,000 IU strengths are not delegated      Failed - Phosphate in normal range and within 360 days    Phosphorus  Date Value Ref Range Status  02/08/2019 3.7 2.5 - 4.6 mg/dL Final          Passed - Ca in normal range and within 360 days    Calcium  Date Value Ref Range Status  05/26/2020 9.8 8.6 - 10.2 mg/dL Final   Calcium, Ion  Date Value Ref Range Status  01/20/2019 1.24 1.15 - 1.40 mmol/L Final          Passed - Vitamin D in normal range and within 360 days    Vit D, 25-Hydroxy  Date Value Ref Range Status  11/10/2019 58.4 30.0 - 100.0 ng/mL Final    Comment:    Vitamin D deficiency has been defined by the Institute of Medicine and an Endocrine Society practice guideline as a level of serum 25-OH vitamin D less than 20 ng/mL (1,2). The Endocrine Society went on to further define vitamin D insufficiency as a level between 21 and 29 ng/mL (2). 1. IOM (Institute of Medicine). 2010. Dietary reference    intakes for calcium and D. [place]: The    [employer]. 2. Holick MF, Binkley [place], Bischoff-Ferrari HA, et al.    Evaluation, treatment, and prevention of vitamin D    deficiency: an Endocrine Society clinical practice    guideline. JCEM. 2011 Jul; 96(7):1911-30.           Passed - Valid encounter within last 12 months    Recent Outpatient Visits            6 days ago Type 2 diabetes mellitus with other neurologic complication, with long-term current use of insulin (HCC)   [facility] [place], [place], MD   6 months ago Type 2 diabetes mellitus with other neurologic complication, with long-term current use of insulin (HCC)   [facility] [place], Odette Horns, MD   9 months ago Dizziness   [facility] [place], Odette Horns, MD   1 year ago Type 2 diabetes mellitus with other neurologic complication, with long-term current use of insulin (HCC)   [facility] [place], Odette Horns, MD   1 year ago Type 2 diabetes mellitus with hyperglycemia, with long-term current use of insulin [facility])   [facility] Hoy Register, MD

## 2020-06-29 ENCOUNTER — Other Ambulatory Visit: Payer: Self-pay | Admitting: Family Medicine

## 2020-06-29 DIAGNOSIS — I10 Essential (primary) hypertension: Secondary | ICD-10-CM

## 2020-06-29 DIAGNOSIS — E559 Vitamin D deficiency, unspecified: Secondary | ICD-10-CM

## 2020-06-29 MED FILL — CARVEDILOL 3.125 MG TABLET: 3.125 | 30 days supply | Qty: 60 | Fill #1

## 2020-06-29 MED FILL — LISINOPRIL 40 MG TABLET: 40 | 30 days supply | Qty: 30 | Fill #0

## 2020-06-29 MED FILL — FUROSEMIDE 20 MG TABS: 20 | 30 days supply | Qty: 30 | Fill #0

## 2020-06-29 MED FILL — GABAPENTIN 300 MG CAPSULE: 300 | 30 days supply | Qty: 60 | Fill #1

## 2020-06-29 MED FILL — ATORVASTATIN CALCIUM 40 MG: 40 | 30 days supply | Qty: 30 | Fill #1

## 2020-06-29 MED FILL — METFORMIN HCL 500 MG TABS: 500 | 30 days supply | Qty: 120 | Fill #0

## 2020-06-29 MED FILL — AMLODIPINE BESYLATE 10 MG T: 10 | 30 days supply | Qty: 30 | Fill #1

## 2020-06-29 MED FILL — MECLIZINE 25 MG TABLET: 25 | 10 days supply | Qty: 30 | Fill #1

## 2020-06-29 MED FILL — TRESIBA FLEXTOUCH 100 UNITS: 100 | 33 days supply | Qty: 6 | Fill #1

## 2020-06-29 NOTE — Telephone Encounter (Signed)
Requested medication (s) are due for refill today: yes  Requested medication (s) are on the active medication list: yes  Last refill:  05/04/2020  Future visit scheduled: no  Notes to clinic:  this refill cannot be delegated    Requested Prescriptions  Pending Prescriptions Disp Refills   Vitamin D, Ergocalciferol, (DRISDOL) 1.25 MG (50000 UNIT) CAPS capsule [Pharmacy Med Name: VIT D2 1.25 MG (50,000 UNIT 1.25 MG Capsule] 2 capsule 1    Sig: TAKE 1 CAPSULE (50,000 UNITS TOTAL) BY MOUTH ONCE A WEEK.      Endocrinology:  Vitamins - Vitamin D Supplementation Failed - 06/29/2020 10:59 AM      Failed - 50,000 IU strengths are not delegated      Failed - Phosphate in normal range and within 360 days    Phosphorus  Date Value Ref Range Status  02/08/2019 3.7 2.5 - 4.6 mg/dL Final          Passed - Ca in normal range and within 360 days    Calcium  Date Value Ref Range Status  05/26/2020 9.8 8.6 - 10.2 mg/dL Final   Calcium, Ion  Date Value Ref Range Status  01/20/2019 1.24 1.15 - 1.40 mmol/L Final          Passed - Vitamin D in normal range and within 360 days    Vit D, 25-Hydroxy  Date Value Ref Range Status  11/10/2019 58.4 30.0 - 100.0 ng/mL Final    Comment:    Vitamin D deficiency has been defined by the Institute of Medicine and an Endocrine Society practice guideline as a level of serum 25-OH vitamin D less than 20 ng/mL (1,2). The Endocrine Society went on to further define vitamin D insufficiency as a level between 21 and 29 ng/mL (2). 1. IOM (Institute of Medicine). 2010. Dietary reference    intakes for calcium and D. [place]: The    [employer]. 2. Holick MF, Binkley [place], Bischoff-Ferrari HA, et al.    Evaluation, treatment, and prevention of vitamin D    deficiency: an Endocrine Society clinical practice    guideline. JCEM. 2011 Jul; 96(7):1911-30.           Passed - Valid encounter within last 12 months    Recent Outpatient Visits            1 month ago Type 2 diabetes mellitus with other neurologic complication, with long-term current use of insulin (HCC)   [facility] [place], [place], MD   7 months ago Type 2 diabetes mellitus with other neurologic complication, with long-term current use of insulin (HCC)   [facility] [place], Odette Horns, MD   10 months ago Dizziness   [facility] [place], Odette Horns, MD   1 year ago Type 2 diabetes mellitus with other neurologic complication, with long-term current use of insulin (HCC)   [facility] [place], Odette Horns, MD   1 year ago Type 2 diabetes mellitus with hyperglycemia, with long-term current use of insulin (HCC)   [facility] [place], Odette Horns, MD                 Signed Prescriptions Disp Refills   furosemide (LASIX) 20 MG tablet 30 tablet 6    Sig: TAKE 1 TABLET (20 MG TOTAL) BY MOUTH DAILY.      Cardiovascular:  Diuretics - Loop Failed - 06/29/2020 10:59 AM  Failed - Last BP in normal range    BP Readings from Last 1 Encounters:  05/26/20 (!) 181/82          Passed - K in normal range and within 360 days    Potassium  Date Value Ref Range Status  05/26/2020 4.9 3.5 - 5.2 mmol/L Final          Passed - Ca in normal range and within 360 days    Calcium  Date Value Ref Range Status  05/26/2020 9.8 8.6 - 10.2 mg/dL Final   Calcium, Ion  Date Value Ref Range Status  01/20/2019 1.24 1.15 - 1.40 mmol/L Final          Passed - Na in normal range and within 360 days    Sodium  Date Value Ref Range Status  05/26/2020 138 134 - 144 mmol/L Final          Passed - Cr in normal range and within 360 days    Creatinine, Ser  Date Value Ref Range Status  05/26/2020 0.85 0.76 - 1.27 mg/dL Final          Passed - Valid encounter within last 6 months    Recent Outpatient Visits           1 month ago Type 2  diabetes mellitus with other neurologic complication, with long-term current use of insulin (HCC)   [facility] [place], [place], MD   7 months ago Type 2 diabetes mellitus with other neurologic complication, with long-term current use of insulin (HCC)   [facility] [place], Odette Horns, MD   10 months ago Dizziness   [facility] [place], [place], MD   1 year ago Type 2 diabetes mellitus with other neurologic complication, with long-term current use of insulin (HCC)   [facility] [place], Odette Horns, MD   1 year ago Type 2 diabetes mellitus with hyperglycemia, with long-term current use of insulin [facility])    [facility] Hoy Register, MD

## 2020-07-11 ENCOUNTER — Other Ambulatory Visit: Payer: Self-pay | Admitting: Family Medicine

## 2020-07-11 MED ORDER — ACCU-CHEK GUIDE VI STRP: ORAL_STRIP | 12 refills | Status: DC

## 2020-07-11 MED ORDER — ACCU-CHEK GUIDE ME W/DEVICE KIT: PACK | 0 refills | Status: DC

## 2020-07-11 MED ORDER — ACCU-CHEK SOFTCLIX LANCETS MISC: 12 refills | Status: DC

## 2020-07-29 ENCOUNTER — Other Ambulatory Visit: Payer: Self-pay | Admitting: Family Medicine

## 2020-07-29 ENCOUNTER — Other Ambulatory Visit: Payer: Self-pay

## 2020-07-29 ENCOUNTER — Telehealth: Payer: Self-pay | Admitting: Family Medicine

## 2020-07-29 DIAGNOSIS — R42 Dizziness and giddiness: Secondary | ICD-10-CM

## 2020-07-29 DIAGNOSIS — E559 Vitamin D deficiency, unspecified: Secondary | ICD-10-CM

## 2020-07-29 MED ORDER — ACCU-CHEK SOFTCLIX LANCETS MISC: 12 refills | Status: DC

## 2020-07-29 MED ORDER — ACCU-CHEK GUIDE VI STRP: ORAL_STRIP | 12 refills | Status: DC

## 2020-07-29 MED ORDER — ACCU-CHEK GUIDE ME W/DEVICE KIT: PACK | 0 refills | Status: DC

## 2020-07-29 MED FILL — ATORVASTATIN CALCIUM 40 MG: 40 | 30 days supply | Qty: 30 | Fill #2

## 2020-07-29 MED FILL — GABAPENTIN 300 MG CAPSULE: 300 | 30 days supply | Qty: 60 | Fill #2

## 2020-07-29 MED FILL — TRESIBA FLEXTOUCH 100 UNITS: 100 | 33 days supply | Qty: 6 | Fill #2

## 2020-07-29 MED FILL — CARVEDILOL 3.125 MG TABLET: 3.125 | 30 days supply | Qty: 60 | Fill #2

## 2020-07-29 MED FILL — METFORMIN HCL 500 MG TABS: 500 | 30 days supply | Qty: 120 | Fill #1

## 2020-07-29 MED FILL — LISINOPRIL 40 MG TABLET: 40 | 30 days supply | Qty: 30 | Fill #1

## 2020-07-29 MED FILL — MECLIZINE 25 MG TABLET: 25 | 10 days supply | Qty: 30 | Fill #0

## 2020-07-29 MED FILL — AMLODIPINE BESYLATE 10 MG T: 10 | 30 days supply | Qty: 30 | Fill #2

## 2020-07-29 MED FILL — FUROSEMIDE 20 MG TABS: 20 | 30 days supply | Qty: 30 | Fill #1

## 2020-07-29 NOTE — Telephone Encounter (Signed)
Requested medication (s) are due for refill today: no  Requested medication (s) are on the active medication list: yes  Last refill:  06/29/2020  Future visit scheduled: no  Notes to clinic: this refill cannot be delegated    Requested Prescriptions  Pending Prescriptions Disp Refills   meclizine (ANTIVERT) 25 MG tablet [Pharmacy Med Name: MECLIZINE 25 MG TABLET 25 Tablet] 30 tablet 1    Sig: Take 1 tablet (25 mg total) by mouth 3 (three) times daily as needed for dizziness.      Not Delegated - Gastroenterology: Antiemetics Failed - 07/29/2020 10:30 AM      Failed - This refill cannot be delegated      Passed - Valid encounter within last 6 months    Recent Outpatient Visits           2 months ago Type 2 diabetes mellitus with other neurologic complication, with long-term current use of insulin (HCC)   [facility] [place], [place], MD   8 months ago Type 2 diabetes mellitus with other neurologic complication, with long-term current use of insulin (HCC)   [facility] [place], Odette Horns, MD   11 months ago Dizziness   [facility] [place], Odette Horns, MD   1 year ago Type 2 diabetes mellitus with other neurologic complication, with long-term current use of insulin (HCC)   [facility] [place], Odette Horns, MD   1 year ago Type 2 diabetes mellitus with hyperglycemia, with long-term current use of insulin (HCC)   [facility] [place], [place], MD                  Vitamin D, Ergocalciferol, (DRISDOL) 1.25 MG (50000 UNIT) CAPS capsule [Pharmacy Med Name: VIT D2 1.25 MG (50,000 UNIT 1.25 MG Capsule] 2 capsule 1    Sig: TAKE 1 CAPSULE (50,000 UNITS TOTAL) BY MOUTH ONCE A WEEK.      Endocrinology:  Vitamins - Vitamin D Supplementation Failed - 07/29/2020 10:30 AM      Failed - 50,000 IU strengths are not delegated      Failed - Phosphate in normal  range and within 360 days    Phosphorus  Date Value Ref Range Status  02/08/2019 3.7 2.5 - 4.6 mg/dL Final          Passed - Ca in normal range and within 360 days    Calcium  Date Value Ref Range Status  05/26/2020 9.8 8.6 - 10.2 mg/dL Final   Calcium, Ion  Date Value Ref Range Status  01/20/2019 1.24 1.15 - 1.40 mmol/L Final          Passed - Vitamin D in normal range and within 360 days    Vit D, 25-Hydroxy  Date Value Ref Range Status  11/10/2019 58.4 30.0 - 100.0 ng/mL Final    Comment:    Vitamin D deficiency has been defined by the Institute of Medicine and an Endocrine Society practice guideline as a level of serum 25-OH vitamin D less than 20 ng/mL (1,2). The Endocrine Society went on to further define vitamin D insufficiency as a level between 21 and 29 ng/mL (2). 1. IOM (Institute of Medicine). 2010. Dietary reference    intakes for calcium and D. [place]: The    [employer]. 2. Holick MF, Binkley [place], Bischoff-Ferrari HA, et al.    Evaluation, treatment, and prevention of vitamin D  deficiency: an Endocrine Society clinical practice    guideline. JCEM. 2011 Jul; 96(7):1911-30.           Passed - Valid encounter within last 12 months    Recent Outpatient Visits           2 months ago Type 2 diabetes mellitus with other neurologic complication, with long-term current use of insulin (HCC)   [facility] [place], [place], MD   8 months ago Type 2 diabetes mellitus with other neurologic complication, with long-term current use of insulin (HCC)   [facility] [place], Odette Horns, MD   11 months ago Dizziness   [facility] [place], Odette Horns, MD   1 year ago Type 2 diabetes mellitus with other neurologic complication, with long-term current use of insulin (HCC)   [facility] [place], Odette Horns, MD   1 year ago Type 2 diabetes mellitus  with hyperglycemia, with long-term current use of insulin [facility])   [facility] [facility] Hoy Register, MD

## 2020-07-29 NOTE — Telephone Encounter (Signed)
Pt sister [facility] is calling and per debra [facility] can not transfer rx's to [address] in [place]. Pt needs accu chek meter and testing strips. Pt checking BS at least 3 times a day

## 2020-07-29 NOTE — Telephone Encounter (Signed)
Sent to [facility]

## 2020-08-04 ENCOUNTER — Other Ambulatory Visit: Payer: Self-pay

## 2020-08-04 MED ORDER — ACCU-CHEK GUIDE ME W/DEVICE KIT: PACK | 0 refills | Status: DC

## 2020-08-04 MED ORDER — ACCU-CHEK SOFTCLIX LANCETS MISC: 12 refills | Status: DC

## 2020-08-04 MED ORDER — ACCU-CHEK GUIDE VI STRP: ORAL_STRIP | 12 refills | Status: DC

## 2020-08-04 NOTE — Telephone Encounter (Signed)
New Rx has been [facility] to [facility] with DX codes.

## 2020-08-04 NOTE — Telephone Encounter (Signed)
Pt's [facility] called saying [facility] with a diagnosis code.

## 2020-08-29 MED FILL — AMLODIPINE BESYLATE 10 MG T: 10 | 30 days supply | Qty: 30 | Fill #3

## 2020-08-29 MED FILL — CARVEDILOL 3.125 MG TABLET: 3.125 | 30 days supply | Qty: 60 | Fill #3

## 2020-08-29 MED FILL — ATORVASTATIN CALCIUM 40 MG: 40 | 30 days supply | Qty: 30 | Fill #3

## 2020-08-29 MED FILL — GABAPENTIN 300 MG CAPSULE: 300 | 30 days supply | Qty: 60 | Fill #3

## 2020-08-29 MED FILL — LISINOPRIL 40 MG TAB: 40 | 30 days supply | Qty: 30 | Fill #2

## 2020-08-29 MED FILL — METFORMIN HCL 500 MG TABS: 500 | 30 days supply | Qty: 120 | Fill #2

## 2020-08-29 MED FILL — MECLIZINE 25 MG TABLET: 25 | 10 days supply | Qty: 30 | Fill #0

## 2020-08-29 MED FILL — FUROSEMIDE 20 MG TABS: 20 | 30 days supply | Qty: 30 | Fill #2

## 2020-08-29 MED FILL — TRESIBA FLEXTOUCH 100 UNITS: 100 | 33 days supply | Qty: 6 | Fill #3

## 2020-09-24 ENCOUNTER — Other Ambulatory Visit: Payer: Self-pay

## 2020-09-27 LAB — HM DIABETES EYE EXAM

## 2020-09-28 ENCOUNTER — Other Ambulatory Visit: Payer: Self-pay

## 2020-09-28 MED FILL — Insulin Degludec Soln Pen-Injector 100 Unit/ML: SUBCUTANEOUS | 16 days supply | Qty: 3 | Fill #0 | Status: AC

## 2020-09-28 MED FILL — Gabapentin Cap 300 MG: ORAL | 30 days supply | Qty: 60 | Fill #0 | Status: AC

## 2020-09-28 MED FILL — Lisinopril Tab 40 MG: ORAL | 30 days supply | Qty: 30 | Fill #0 | Status: AC

## 2020-09-28 MED FILL — Carvedilol Tab 3.125 MG: ORAL | 30 days supply | Qty: 60 | Fill #0 | Status: AC

## 2020-09-28 MED FILL — Meclizine HCl Tab 25 MG: ORAL | 10 days supply | Qty: 30 | Fill #0 | Status: AC

## 2020-09-28 MED FILL — Metformin HCl Tab 500 MG: ORAL | 30 days supply | Qty: 120 | Fill #0 | Status: AC

## 2020-09-28 MED FILL — Furosemide Tab 20 MG: ORAL | 30 days supply | Qty: 30 | Fill #0 | Status: AC

## 2020-09-28 MED FILL — Atorvastatin Calcium Tab 40 MG (Base Equivalent): ORAL | 30 days supply | Qty: 30 | Fill #0 | Status: AC

## 2020-09-28 MED FILL — Amlodipine Besylate Tab 10 MG (Base Equivalent): ORAL | 30 days supply | Qty: 30 | Fill #0 | Status: AC

## 2020-09-30 ENCOUNTER — Other Ambulatory Visit: Payer: Self-pay

## 2020-10-31 ENCOUNTER — Other Ambulatory Visit: Payer: Self-pay | Admitting: Family Medicine

## 2020-10-31 ENCOUNTER — Other Ambulatory Visit: Payer: Self-pay

## 2020-10-31 DIAGNOSIS — E559 Vitamin D deficiency, unspecified: Secondary | ICD-10-CM

## 2020-10-31 DIAGNOSIS — Z794 Long term (current) use of insulin: Secondary | ICD-10-CM

## 2020-10-31 DIAGNOSIS — E1169 Type 2 diabetes mellitus with other specified complication: Secondary | ICD-10-CM

## 2020-10-31 DIAGNOSIS — R42 Dizziness and giddiness: Secondary | ICD-10-CM

## 2020-10-31 MED FILL — Carvedilol Tab 3.125 MG: ORAL | 30 days supply | Qty: 60 | Fill #1 | Status: AC

## 2020-10-31 MED FILL — Atorvastatin Calcium Tab 40 MG (Base Equivalent): ORAL | 30 days supply | Qty: 30 | Fill #1 | Status: AC

## 2020-10-31 MED FILL — Furosemide Tab 20 MG: ORAL | 30 days supply | Qty: 30 | Fill #1 | Status: AC

## 2020-10-31 MED FILL — Amlodipine Besylate Tab 10 MG (Base Equivalent): ORAL | 30 days supply | Qty: 30 | Fill #1 | Status: AC

## 2020-10-31 MED FILL — Lisinopril Tab 40 MG: ORAL | 30 days supply | Qty: 30 | Fill #1 | Status: AC

## 2020-10-31 MED FILL — Metformin HCl Tab 500 MG: ORAL | 30 days supply | Qty: 120 | Fill #1 | Status: AC

## 2020-10-31 MED FILL — Gabapentin Cap 300 MG: ORAL | 30 days supply | Qty: 60 | Fill #1 | Status: AC

## 2020-10-31 NOTE — Telephone Encounter (Signed)
Requested medication (s) are due for refill today: no  Requested medication (s) are on the active medication list: yes  Future visit scheduled: no  Notes to clinic: 50,000 IU strengths are not delegated   Requested Prescriptions  Pending Prescriptions Disp Refills   ergocalciferol (DRISDOL) 1.25 MG (50000 UT) capsule 9 capsule 1    Sig: Take 1 capsule (50,000 Units total) by mouth once a week.      Endocrinology:  Vitamins - Vitamin D Supplementation Failed - 10/31/2020  8:59 AM      Failed - 50,000 IU strengths are not delegated      Failed - Phosphate in normal range and within 360 days    Phosphorus  Date Value Ref Range Status  02/08/2019 3.7 2.5 - 4.6 mg/dL Final          Passed - Ca in normal range and within 360 days    Calcium  Date Value Ref Range Status  05/26/2020 9.8 8.6 - 10.2 mg/dL Final   Calcium, Ion  Date Value Ref Range Status  01/20/2019 1.24 1.15 - 1.40 mmol/L Final          Passed - Vitamin D in normal range and within 360 days    Vit D, 25-Hydroxy  Date Value Ref Range Status  11/10/2019 58.4 30.0 - 100.0 ng/mL Final    Comment:    Vitamin D deficiency has been defined by the Institute of Medicine and an Endocrine Society practice guideline as a level of serum 25-OH vitamin D less than 20 ng/mL (1,2). The Endocrine Society went on to further define vitamin D insufficiency as a level between 21 and 29 ng/mL (2). 1. IOM (Institute of Medicine). 2010. Dietary reference    intakes for calcium and D. [place]: The    [employer]. 2. Holick MF, Binkley [place], Bischoff-Ferrari HA, et al.    Evaluation, treatment, and prevention of vitamin D    deficiency: an Endocrine Society clinical practice    guideline. JCEM. 2011 Jul; 96(7):1911-30.           Passed - Valid encounter within last 12 months    Recent Outpatient Visits           5 months ago Type 2 diabetes mellitus with other neurologic complication, with long-term current  use of insulin (HCC)   [facility] [place], [place], MD   11 months ago Type 2 diabetes mellitus with other neurologic complication, with long-term current use of insulin (HCC)   [facility] [place], Odette Horns, MD   1 year ago Dizziness   [facility] [place], Odette Horns, MD   1 year ago Type 2 diabetes mellitus with other neurologic complication, with long-term current use of insulin (HCC)   [facility] [place], Odette Horns, MD   1 year ago Type 2 diabetes mellitus with hyperglycemia, with long-term current use of insulin (HCC)   [facility] [place], [place], MD                  Insulin Pen Needle (PEN NEEDLES) 32G X 4 MM MISC 100 each 2    Sig: 1 Units by Does not apply route daily.      Endocrinology: Diabetes - Testing Supplies Passed - 10/31/2020  8:59 AM      Passed - Valid encounter within last 12 months    Recent Outpatient Visits  5 months ago Type 2 diabetes mellitus with other neurologic complication, with long-term current use of insulin (HCC)   [facility] [place], [place], MD   11 months ago Type 2 diabetes mellitus with other neurologic complication, with long-term current use of insulin (HCC)   [facility] [place], Odette Horns, MD   1 year ago Dizziness   [facility] [place], Odette Horns, MD   1 year ago Type 2 diabetes mellitus with other neurologic complication, with long-term current use of insulin (HCC)   [facility] [place], Odette Horns, MD   1 year ago Type 2 diabetes mellitus with hyperglycemia, with long-term current use of insulin [facility])   [address] [facility] Hoy Register, MD

## 2020-10-31 NOTE — Telephone Encounter (Signed)
Requested medication (s) are due for refill today:  yes  Requested medication (s) are on the active medication list: yes  Last refill:  09/30/2020  Future visit scheduled: no  Notes to clinic: review for refill Pharmacy requesting change   Requested Prescriptions  Pending Prescriptions Disp Refills   meclizine (ANTIVERT) 25 MG tablet 30 tablet 1    Sig: TAKE 1 TABLET (25 MG TOTAL) BY MOUTH 3 (THREE) TIMES DAILY AS NEEDED FOR DIZZINESS.      Not Delegated - Gastroenterology: Antiemetics Failed - 10/31/2020  8:50 AM      Failed - This refill cannot be delegated      Passed - Valid encounter within last 6 months    Recent Outpatient Visits           5 months ago Type 2 diabetes mellitus with other neurologic complication, with long-term current use of insulin (HCC)   [facility] [place], [place], MD   11 months ago Type 2 diabetes mellitus with other neurologic complication, with long-term current use of insulin (HCC)   [facility] [place], Odette Horns, MD   1 year ago Dizziness   [facility] [place], Odette Horns, MD   1 year ago Type 2 diabetes mellitus with other neurologic complication, with long-term current use of insulin (HCC)   [facility] [place], Odette Horns, MD   1 year ago Type 2 diabetes mellitus with hyperglycemia, with long-term current use of insulin (HCC)   [facility] [place], Onycha, MD                  insulin degludec (TRESIBA FLEXTOUCH) 100 UNIT/ML FlexTouch Pen 9 mL 2    Sig: INJECT 18 UNITS INTO THE SKIN DAILY.      Endocrinology:  Diabetes - Insulins Passed - 10/31/2020  8:50 AM      Passed - HBA1C is between 0 and 7.9 and within 180 days    HbA1c, POC (prediabetic range)  Date Value Ref Range Status  11/10/2019 5.0 (A) 5.7 - 6.4 % Final   HbA1c, POC (controlled diabetic range)  Date Value Ref Range Status   05/26/2020 5.5 0.0 - 7.0 % Final          Passed - Valid encounter within last 6 months    Recent Outpatient Visits           5 months ago Type 2 diabetes mellitus with other neurologic complication, with long-term current use of insulin (HCC)   [facility] [place], [place], MD   11 months ago Type 2 diabetes mellitus with other neurologic complication, with long-term current use of insulin (HCC)   [facility] [place], Odette Horns, MD   1 year ago Dizziness   [facility] [place], [place], MD   1 year ago Type 2 diabetes mellitus with other neurologic complication, with long-term current use of insulin (HCC)   [facility] [place], Odette Horns, MD   1 year ago Type 2 diabetes mellitus with hyperglycemia, with long-term current use of insulin [facility])    [facility] Hoy Register, MD

## 2020-11-02 ENCOUNTER — Other Ambulatory Visit: Payer: Self-pay

## 2020-11-02 ENCOUNTER — Other Ambulatory Visit: Payer: Self-pay | Admitting: Family Medicine

## 2020-11-02 DIAGNOSIS — R42 Dizziness and giddiness: Secondary | ICD-10-CM

## 2020-11-02 DIAGNOSIS — E1149 Type 2 diabetes mellitus with other diabetic neurological complication: Secondary | ICD-10-CM

## 2020-11-02 DIAGNOSIS — E1169 Type 2 diabetes mellitus with other specified complication: Secondary | ICD-10-CM

## 2020-11-02 DIAGNOSIS — Z794 Long term (current) use of insulin: Secondary | ICD-10-CM

## 2020-11-02 MED ORDER — TRESIBA FLEXTOUCH 100 UNIT/ML ~~LOC~~ SOPN
PEN_INJECTOR | SUBCUTANEOUS | 2 refills | Status: DC
  Filled 2020-11-02: qty 6, 33d supply, fill #0
  Filled 2020-11-10: qty 9, 50d supply, fill #0
  Filled 2020-11-28 – 2020-12-29 (×3): qty 9, 50d supply, fill #1
  Filled 2021-02-03 – 2021-02-06 (×2): qty 9, 50d supply, fill #2

## 2020-11-02 MED FILL — Amlodipine Besylate Tab 10 MG (Base Equivalent): ORAL | 30 days supply | Qty: 30 | Fill #2 | Status: CN

## 2020-11-02 NOTE — Telephone Encounter (Signed)
Requested medication (s) are due for refill today-Undetermined  Requested medication (s) are on the active medication list Yes  Future visit scheduled Yes for 01/10/21.  LOV 05/26/21  Note to clinic-This medication is not delegated for [facility] to refill.   Requested Prescriptions  Pending Prescriptions Disp Refills   meclizine (ANTIVERT) 25 MG tablet 30 tablet 1    Sig: TAKE 1 TABLET (25 MG TOTAL) BY MOUTH 3 (THREE) TIMES DAILY AS NEEDED FOR DIZZINESS.      Not Delegated - Gastroenterology: Antiemetics Failed - 11/02/2020  2:34 PM      Failed - This refill cannot be delegated      Passed - Valid encounter within last 6 months    Recent Outpatient Visits           5 months ago Type 2 diabetes mellitus with other neurologic complication, with long-term current use of insulin (HCC)   [facility] [place], [place], MD   11 months ago Type 2 diabetes mellitus with other neurologic complication, with long-term current use of insulin (HCC)   [facility] [place], Odette Horns, MD   1 year ago Dizziness   [facility] [place], Odette Horns, MD   1 year ago Type 2 diabetes mellitus with other neurologic complication, with long-term current use of insulin (HCC)   [facility] [place], Odette Horns, MD   1 year ago Type 2 diabetes mellitus with hyperglycemia, with long-term current use of insulin (HCC)   [facility] Hoy Register, MD       Future Appointments             In 2 months Hoy Register, MD [facility]              Signed Prescriptions Disp Refills   insulin degludec (TRESIBA FLEXTOUCH) 100 UNIT/ML FlexTouch Pen 9 mL 2    Sig: INJECT 18 UNITS INTO THE SKIN DAILY.      Endocrinology:  Diabetes - Insulins Passed - 11/02/2020  2:34 PM      Passed - HBA1C is between 0 and 7.9 and within 180 days    HbA1c, POC  (prediabetic range)  Date Value Ref Range Status  11/10/2019 5.0 (A) 5.7 - 6.4 % Final   HbA1c, POC (controlled diabetic range)  Date Value Ref Range Status  05/26/2020 5.5 0.0 - 7.0 % Final          Passed - Valid encounter within last 6 months    Recent Outpatient Visits           5 months ago Type 2 diabetes mellitus with other neurologic complication, with long-term current use of insulin (HCC)   [facility] [place], [place], MD   11 months ago Type 2 diabetes mellitus with other neurologic complication, with long-term current use of insulin (HCC)   [facility] [place], Odette Horns, MD   1 year ago Dizziness   [facility] [place], [place], MD   1 year ago Type 2 diabetes mellitus with other neurologic complication, with long-term current use of insulin (HCC)   [facility] [place], Odette Horns, MD   1 year ago Type 2 diabetes mellitus with hyperglycemia, with long-term current use of insulin [facility])   [facility] Hoy Register, MD  Future Appointments             In 2 months Hoy Register, MD [facility]

## 2020-11-02 NOTE — Telephone Encounter (Signed)
[place] flextouch approved per protocol. Future appointment 01/10/21. Requested Prescriptions  Pending Prescriptions Disp Refills  . meclizine (ANTIVERT) 25 MG tablet 30 tablet 1    Sig: TAKE 1 TABLET (25 MG TOTAL) BY MOUTH 3 (THREE) TIMES DAILY AS NEEDED FOR DIZZINESS.     Not Delegated - Gastroenterology: Antiemetics Failed - 11/02/2020  2:34 PM      Failed - This refill cannot be delegated      Passed - Valid encounter within last 6 months    Recent Outpatient Visits          5 months ago Type 2 diabetes mellitus with other neurologic complication, with long-term current use of insulin (HCC)   [facility] [place], [place], MD   11 months ago Type 2 diabetes mellitus with other neurologic complication, with long-term current use of insulin (HCC)   [facility] [place], Odette Horns, MD   1 year ago Dizziness   [place] [facility] [place], Odette Horns, MD   1 year ago Type 2 diabetes mellitus with other neurologic complication, with long-term current use of insulin (HCC)   [facility] [place], Odette Horns, MD   1 year ago Type 2 diabetes mellitus with hyperglycemia, with long-term current use of insulin (HCC)   [facility] Hoy Register, MD      Future Appointments            In 2 months Hoy Register, MD [facility]           . insulin degludec (TRESIBA FLEXTOUCH) 100 UNIT/ML FlexTouch Pen 9 mL 2    Sig: INJECT 18 UNITS INTO THE SKIN DAILY.     Endocrinology:  Diabetes - Insulins Passed - 11/02/2020  2:34 PM      Passed - HBA1C is between 0 and 7.9 and within 180 days    HbA1c, POC (prediabetic range)  Date Value Ref Range Status  11/10/2019 5.0 (A) 5.7 - 6.4 % Final   HbA1c, POC (controlled diabetic range)  Date Value Ref Range Status  05/26/2020 5.5 0.0 - 7.0 % Final         Passed - Valid encounter within last 6  months    Recent Outpatient Visits          5 months ago Type 2 diabetes mellitus with other neurologic complication, with long-term current use of insulin (HCC)   [place] [facility] [place], [address], MD   11 months ago Type 2 diabetes mellitus with other neurologic complication, with long-term current use of insulin (HCC)   [facility] [place], Odette Horns, MD   1 year ago Dizziness   [facility] [place], [place], MD   1 year ago Type 2 diabetes mellitus with other neurologic complication, with long-term current use of insulin (HCC)   [facility] [place], Odette Horns, MD   1 year ago Type 2 diabetes mellitus with hyperglycemia, with long-term current use of insulin (HCC)   [place] [facility] Hoy Register, MD      Future Appointments            In 2 months Hoy Register, MD [facility]

## 2020-11-03 MED ORDER — MECLIZINE HCL 25 MG PO TABS
ORAL_TABLET | Freq: Three times a day (TID) | ORAL | 1 refills | Status: DC | PRN
  Filled 2020-11-03: qty 30, 10d supply, fill #0
  Filled 2020-11-28: qty 30, 10d supply, fill #1

## 2020-11-04 ENCOUNTER — Other Ambulatory Visit: Payer: Self-pay

## 2020-11-09 ENCOUNTER — Other Ambulatory Visit: Payer: Self-pay

## 2020-11-10 ENCOUNTER — Other Ambulatory Visit: Payer: Self-pay

## 2020-11-10 MED FILL — Amlodipine Besylate Tab 10 MG (Base Equivalent): ORAL | 30 days supply | Qty: 30 | Fill #2 | Status: CN

## 2020-11-14 ENCOUNTER — Other Ambulatory Visit: Payer: Self-pay

## 2020-11-28 ENCOUNTER — Other Ambulatory Visit: Payer: Self-pay

## 2020-11-28 ENCOUNTER — Other Ambulatory Visit: Payer: Self-pay | Admitting: Family Medicine

## 2020-11-28 DIAGNOSIS — E559 Vitamin D deficiency, unspecified: Secondary | ICD-10-CM

## 2020-11-28 MED FILL — Carvedilol Tab 3.125 MG: ORAL | 30 days supply | Qty: 60 | Fill #2 | Status: AC

## 2020-11-28 MED FILL — Furosemide Tab 20 MG: ORAL | 30 days supply | Qty: 30 | Fill #2 | Status: AC

## 2020-11-28 MED FILL — Gabapentin Cap 300 MG: ORAL | 30 days supply | Qty: 60 | Fill #2 | Status: AC

## 2020-11-28 MED FILL — Amlodipine Besylate Tab 10 MG (Base Equivalent): ORAL | 30 days supply | Qty: 30 | Fill #2 | Status: AC

## 2020-11-28 MED FILL — Atorvastatin Calcium Tab 40 MG (Base Equivalent): ORAL | 30 days supply | Qty: 30 | Fill #2 | Status: AC

## 2020-11-28 MED FILL — Metformin HCl Tab 500 MG: ORAL | 30 days supply | Qty: 120 | Fill #2 | Status: AC

## 2020-11-28 MED FILL — Lisinopril Tab 40 MG: ORAL | 30 days supply | Qty: 30 | Fill #2 | Status: AC

## 2020-11-28 NOTE — Telephone Encounter (Signed)
Requested medications are due for refill today.  yes  Requested medications are on the active medications list.  yes  Last refill. 08/19/2019  Future visit scheduled.   yes  Notes to clinic.  Medication not delegated and prescription is expired.

## 2020-11-29 ENCOUNTER — Other Ambulatory Visit: Payer: Self-pay

## 2020-12-12 ENCOUNTER — Other Ambulatory Visit: Payer: Self-pay

## 2020-12-29 ENCOUNTER — Other Ambulatory Visit: Payer: Self-pay | Admitting: Family Medicine

## 2020-12-29 ENCOUNTER — Other Ambulatory Visit: Payer: Self-pay

## 2020-12-29 DIAGNOSIS — E1149 Type 2 diabetes mellitus with other diabetic neurological complication: Secondary | ICD-10-CM

## 2020-12-29 DIAGNOSIS — I152 Hypertension secondary to endocrine disorders: Secondary | ICD-10-CM

## 2020-12-29 DIAGNOSIS — R42 Dizziness and giddiness: Secondary | ICD-10-CM

## 2020-12-29 DIAGNOSIS — Z794 Long term (current) use of insulin: Secondary | ICD-10-CM

## 2020-12-29 DIAGNOSIS — E1169 Type 2 diabetes mellitus with other specified complication: Secondary | ICD-10-CM

## 2020-12-29 DIAGNOSIS — E1159 Type 2 diabetes mellitus with other circulatory complications: Secondary | ICD-10-CM

## 2020-12-29 MED FILL — Metformin HCl Tab 500 MG: ORAL | 30 days supply | Qty: 120 | Fill #3 | Status: AC

## 2020-12-29 MED FILL — Lisinopril Tab 40 MG: ORAL | 30 days supply | Qty: 30 | Fill #3 | Status: AC

## 2020-12-29 MED FILL — Furosemide Tab 20 MG: ORAL | 30 days supply | Qty: 30 | Fill #3 | Status: AC

## 2020-12-29 NOTE — Telephone Encounter (Signed)
Notes to clinic:  Patient has appt on 01/10/2021 Review for refill   Requested Prescriptions  Pending Prescriptions Disp Refills   Insulin Pen Needle (PEN NEEDLES) 32G X 4 MM MISC 100 each 2    Sig: 1 Units by Does not apply route daily.      Endocrinology: Diabetes - Testing Supplies Passed - 12/29/2020 10:05 AM      Passed - Valid encounter within last 12 months    Recent Outpatient Visits           7 months ago Type 2 diabetes mellitus with other neurologic complication, with long-term current use of insulin (HCC)   [facility] [place], [place], MD   1 year ago Type 2 diabetes mellitus with other neurologic complication, with long-term current use of insulin (HCC)   [facility] [place], Odette Horns, MD   1 year ago Dizziness   [facility] [facility] [place], Odette Horns, MD   1 year ago Type 2 diabetes mellitus with other neurologic complication, with long-term current use of insulin (HCC)   [facility] [place], Odette Horns, MD   1 year ago Type 2 diabetes mellitus with hyperglycemia, with long-term current use of insulin (HCC)   [facility] Hoy Register, MD       Future Appointments             In 4 days  Post-COVID Care Clinic at [place]   In 1 week Hoy Register, MD [facility]               atorvastatin (LIPITOR) 40 MG tablet 30 tablet 6    Sig: TAKE 1 TABLET (40 MG TOTAL) BY MOUTH DAILY.      Cardiovascular:  Antilipid - Statins Passed - 12/29/2020 10:05 AM      Passed - Total Cholesterol in normal range and within 360 days    Cholesterol, Total  Date Value Ref Range Status  05/26/2020 132 100 - 199 mg/dL Final          Passed - LDL in normal range and within 360 days    LDL Chol Calc (NIH)  Date Value Ref Range Status  05/26/2020 65 0 - 99 mg/dL Final          Passed - HDL in normal range and  within 360 days    HDL  Date Value Ref Range Status  05/26/2020 54 >39 mg/dL Final          Passed - Triglycerides in normal range and within 360 days    Triglycerides  Date Value Ref Range Status  05/26/2020 63 0 - 149 mg/dL Final          Passed - Patient is not pregnant      Passed - Valid encounter within last 12 months    Recent Outpatient Visits           7 months ago Type 2 diabetes mellitus with other neurologic complication, with long-term current use of insulin (HCC)   [facility] [place], [place], MD   1 year ago Type 2 diabetes mellitus with other neurologic complication, with long-term current use of insulin (HCC)   [facility] Hoy Register, MD   1 year ago Dizziness    [facility] [place], Odette Horns, MD   1 year ago Type 2 diabetes  mellitus with other neurologic complication, with long-term current use of insulin (HCC)   [facility] [place], [place], MD   1 year ago Type 2 diabetes mellitus with hyperglycemia, with long-term current use of insulin (HCC)   [place] [facility] Hoy Register, MD       Future Appointments             In 4 days  Post-COVID Care Clinic at [place]   In 1 week Hoy Register, MD [facility]               carvedilol (COREG) 3.125 MG tablet 60 tablet 6    Sig: TAKE 1 TABLET (3.125 MG TOTAL) BY MOUTH 2 (TWO) TIMES DAILY WITH A MEAL.      Cardiovascular:  Beta Blockers Failed - 12/29/2020 10:05 AM      Failed - Last BP in normal range    BP Readings from Last 1 Encounters:  05/26/20 (!) 181/82          Failed - Valid encounter within last 6 months    Recent Outpatient Visits           7 months ago Type 2 diabetes mellitus with other neurologic complication, with long-term current use of insulin (HCC)   [facility] [place],  [place], MD   1 year ago Type 2 diabetes mellitus with other neurologic complication, with long-term current use of insulin (HCC)   [facility] [facility] [place], Odette Horns, MD   1 year ago Dizziness   [facility] [place], [place], MD   1 year ago Type 2 diabetes mellitus with other neurologic complication, with long-term current use of insulin (HCC)   [facility] [place], [place], MD   1 year ago Type 2 diabetes mellitus with hyperglycemia, with long-term current use of insulin (HCC)   [facility] Hoy Register, MD       Future Appointments             In 4 days  Post-COVID Care Clinic at [place]   In 1 week Hoy Register, MD [facility]             Passed - Last Heart Rate in normal range    Pulse Readings from Last 1 Encounters:  05/26/20 70            gabapentin (NEURONTIN) 300 MG capsule 60 capsule 6    Sig: TAKE 2 CAPSULES (600 MG TOTAL) BY MOUTH AT BEDTIME.      Neurology: Anticonvulsants - gabapentin Passed - 12/29/2020 10:05 AM      Passed - Valid encounter within last 12 months    Recent Outpatient Visits           7 months ago Type 2 diabetes mellitus with other neurologic complication, with long-term current use of insulin (HCC)   [facility] [place], [place], MD   1 year ago Type 2 diabetes mellitus with other neurologic complication, with long-term current use of insulin (HCC)   [facility] Hoy Register, MD   1 year ago Dizziness   [facility] [place], Odette Horns, MD   1 year ago Type 2 diabetes mellitus with other neurologic complication, with long-term current use of insulin [facility])   [facility] Hoy Register, MD   1 year ago Type 2 diabetes mellitus with hyperglycemia, with long-term current use of  insulin (HCC)   [facility] Hoy Register, MD       Future Appointments             In 4 days  Post-COVID Care Clinic at [place]   In 1 week Hoy Register, MD [facility]               amLODipine (NORVASC) 10 MG tablet 30 tablet 6    Sig: TAKE 1 TABLET (10 MG TOTAL) BY MOUTH DAILY.      Cardiovascular:  Calcium Channel Blockers Failed - 12/29/2020 10:05 AM      Failed - Last BP in normal range    BP Readings from Last 1 Encounters:  05/26/20 (!) 181/82          Failed - Valid encounter within last 6 months    Recent Outpatient Visits           7 months ago Type 2 diabetes mellitus with other neurologic complication, with long-term current use of insulin (HCC)   [facility], [place], MD   1 year ago Type 2 diabetes mellitus with other neurologic complication, with long-term current use of insulin (HCC)   [place] [facility] [place], Odette Horns, MD   1 year ago Dizziness   [facility] [place], [place], MD   1 year ago Type 2 diabetes mellitus with other neurologic complication, with long-term current use of insulin (HCC)   [place] [facility] [place], [place]oz[place], MD   1 year ago Type 2 diabetes mellitus with hyperglycemia, with long-term current use of insulin (HCC)   [facility] Hoy Register, MD       Future Appointments             In 4 days  Post-COVID Care Clinic at [place]   In 1 week Hoy Register, MD [facility]               meclizine (ANTIVERT) 25 MG tablet 30 tablet 1    Sig: TAKE 1 TABLET (25 MG TOTAL) BY MOUTH 3 (THREE) TIMES DAILY AS NEEDED FOR DIZZINESS.      Not Delegated - Gastroenterology: Antiemetics Failed - 12/29/2020 10:05 AM      Failed - This refill cannot be delegated      Failed - Valid encounter within last 6  months    Recent Outpatient Visits           7 months ago Type 2 diabetes mellitus with other neurologic complication, with long-term current use of insulin (HCC)   [place] [facility] [place], [place], MD   1 year ago Type 2 diabetes mellitus with other neurologic complication, with long-term current use of insulin (HCC)   [place] [facility] [place], Odette Horns, MD   1 year ago Dizziness   [facility] [place], Odette Horns, MD   1 year ago Type 2 diabetes mellitus with other neurologic complication, with long-term current use of insulin (HCC)   [place] [facility] [place], Odette Horns, MD   1 year ago Type 2 diabetes mellitus with hyperglycemia, with long-term current use of insulin [facility])    [facility] [place],  Odette HornsEnobong, MD       Future Appointments             In 4 days  [place]COVID Care [facility] at [place]   In 1 week Hoy RegisterNewlin, Enobong, MD [facility]

## 2020-12-30 ENCOUNTER — Other Ambulatory Visit: Payer: Self-pay

## 2020-12-30 MED ORDER — GABAPENTIN 300 MG PO CAPS
ORAL_CAPSULE | ORAL | 6 refills | Status: DC
  Filled 2020-12-30: qty 60, 30d supply, fill #0
  Filled 2021-02-03: qty 60, 30d supply, fill #1
  Filled 2021-03-03: qty 60, 30d supply, fill #2
  Filled 2021-03-31: qty 60, 30d supply, fill #3
  Filled 2021-04-28: qty 60, 30d supply, fill #4
  Filled 2021-06-05: qty 60, 30d supply, fill #5
  Filled 2021-07-06: qty 60, 30d supply, fill #0

## 2020-12-30 MED ORDER — CARVEDILOL 3.125 MG PO TABS
3.1250 mg | ORAL_TABLET | Freq: Two times a day (BID) | ORAL | 0 refills | Status: DC
  Filled 2020-12-30: qty 60, 30d supply, fill #0

## 2020-12-30 MED ORDER — INSULIN PEN NEEDLE 32G X 4 MM MISC
1.0000 [IU] | Freq: Every day | 0 refills | Status: DC
  Filled 2020-12-30: qty 100, 90d supply, fill #0

## 2020-12-30 MED ORDER — AMLODIPINE BESYLATE 10 MG PO TABS
10.0000 mg | ORAL_TABLET | Freq: Every day | ORAL | 0 refills | Status: DC
  Filled 2020-12-30: qty 30, 30d supply, fill #0

## 2020-12-30 MED ORDER — MECLIZINE HCL 25 MG PO TABS
ORAL_TABLET | Freq: Three times a day (TID) | ORAL | 0 refills | Status: DC | PRN
  Filled 2020-12-30: qty 30, 10d supply, fill #0

## 2020-12-30 MED ORDER — ATORVASTATIN CALCIUM 40 MG PO TABS
40.0000 mg | ORAL_TABLET | Freq: Every day | ORAL | 0 refills | Status: DC
  Filled 2020-12-30: qty 30, 30d supply, fill #0

## 2021-01-02 ENCOUNTER — Ambulatory Visit (INDEPENDENT_AMBULATORY_CARE_PROVIDER_SITE_OTHER): Payer: Medicare Other | Admitting: Nurse Practitioner

## 2021-01-02 VITALS — BP 124/64 | HR 77 | Temp 97.9°F | Resp 18 | Ht 69.0 in | Wt 198.0 lb

## 2021-01-02 DIAGNOSIS — Z0000 Encounter for general adult medical examination without abnormal findings: Secondary | ICD-10-CM | POA: Diagnosis not present

## 2021-01-02 NOTE — Patient Instructions (Addendum)
Frederick Moody , Thank you for taking time to come for your Medicare Wellness Visit. I appreciate your ongoing commitment to your health goals. Please review the following plan we discussed and let me know if I can assist you in the future.   These are the goals we discussed:  Goals     . Cut out extra servings        This is a list of the screening recommended for you and due dates:  Health Maintenance  Topic Date Due  . COVID-19 Vaccine (1) Never done  . Complete foot exam   Never done  . Tetanus Vaccine  Never done  . Colon Cancer Screening  Never done  . Zoster (Shingles) Vaccine (1 of 2) Never done  . Pneumonia vaccines (1 of 2 - PCV13) 06/12/2020  . Hemoglobin A1C  11/24/2020  . Flu Shot  01/23/2021  . Eye exam for diabetics  09/27/2021  . Hepatitis C Screening: USPSTF Recommendation to screen - Ages 47-79 yo.  Completed  . HIV Screening  Completed  . HPV Vaccine  Aged Out    Screening for Type 2 Diabetes  A screening test for type 2 diabetes (type 2 diabetes mellitus) is a blood test to measure your blood sugar (glucose) level. This test is done to check for early signs of diabetes, before youdevelop symptoms.  Type 2 diabetes is a long-term (chronic) disease. In type 2 diabetes, one or both of these problems may be present: The pancreas does not make enough of a hormone called insulin. Cells in the body do not respond properly to insulin that the body makes (insulin resistance). Normally, insulin allows blood sugar (glucose) to enter cells in the body. The cells use glucose for energy. Insulin resistance or lack of insulin causes excess glucose to build up in the blood instead of going into cells. This results in high blood glucose levels (hyperglycemia), which can cause many complications. You may be screened for type 2 diabetes as part of your regular health care, especially if you have a high risk for diabetes. Screening can help to identify type 2 diabetes at its early  stage (prediabetes). Identifying and treating prediabetes may delay or prevent the development oftype 2 diabetes. What are the risk factors for type 2 diabetes? The following factors may make you more likely to develop type 2 diabetes: Having a parent or sibling (first-degree relative) who has diabetes. Being overweight or obese. Being of American-Indian, African-American, Hispanic, Latino, Asian, or Pacific Islander descent. Not getting enough exercise (having a sedentary lifestyle). Being older than age 76. Having a history of diabetes during pregnancy (gestational diabetes). Having low levels of good cholesterol (HDL-C) or high levels of blood fats (triglycerides). Having high blood glucose in a previous blood test. Having high blood pressure. Having certain diseases or conditions that may be caused by insulin resistance, including: Acanthosis nigricans. This is a condition that causes dark skin on the neck, armpits, and groin. Polycystic ovary syndrome (PCOS). Cardiovascular heart disease. Who should be screened for type 2 diabetes? Adults Adults age 65 and older. These adults should be screened once every three years. Adults who are younger than age 58, are overweight, and have one other risk factor. These adults should be screened once every three years. Adults who have normal blood glucose levels and two or more risk factors. These adults may be screened once every year (annually). Women who have had gestational diabetes in the past. These women should be screened once  every three years. Pregnant women who have risk factors. These women should be screened at their first prenatal visit and again between weeks 24 and 28 of pregnancy. Children and adolescents Children and adolescents should be screened for type 2 diabetes if they are overweight and have any of the following risk factors: A family history of type 2 diabetes. Being a member of a high-risk ethnic group. Signs of insulin  resistance or conditions that are associated with insulin resistance. A mother who had gestational diabetes while pregnant with him or her. Screening should be done at least once every three years, starting at age 23 or at the onset of puberty, whichever comes first. Your health care provider or your child's health care provider may recommendhaving a screening more or less often. What happens during screening? During screening, your health care provider may ask questions about: Your health and your risk factors, including your activity level and any medical conditions that you have. The health of your first-degree relatives. Past pregnancies, if this applies. Your health care provider will also do a physical exam, including a blood pressure measurement and blood tests. There are four blood tests that can be used to screen for type 2 diabetes. You may have one or more of the following: A fasting blood glucose (FBG) test. You will not be allowed to eat (you will fast) for 8 hours or more before a blood Moody is taken. A random blood glucose test. This test checks your blood glucose at any time of the day regardless of when you ate. An oral glucose tolerance test (OGTT). This test measures your blood glucose at two times: After you have not eaten (have fasted) overnight. This is your baseline glucose level. Two hours after you drink a glucose-containing beverage. An A1c (hemoglobin A1c) blood test. This test provides information about blood glucose control over the previous 2-3 months. What do the results mean? Your test results are a measurement of how much glucose is in your blood. Normal blood glucose levels mean that you do not have diabetes or prediabetes. High blood glucose levels may mean that you have prediabetes or diabetes.Depending on the results, other tests may be needed to confirm the diagnosis. You may be diagnosed with type 2 diabetes if: Your FBG level is 126 mg/dL (7.0 mmol/L) or  higher. Your random blood glucose level is 200 mg/dL (70.3 mmol/L) or higher. Your A1c level is 6.5% or higher. Your OGTT result is higher than 200 mg/dL (50.0 mmol/L). These blood tests may be repeated to confirm your diagnosis. Talk with yourhealth care provider about what your results mean. Summary A screening test for type 2 diabetes (type 2 diabetes mellitus) is a blood test to measure your blood sugar (glucose) level. Know what your risk factors are for developing type 2 diabetes. If you are at risk, get screening tests as often as told by your health care provider. Screening may help you identify type 2 diabetes at its early stage (prediabetes). Identifying and treating prediabetes may delay or prevent the development of type 2 diabetes. This information is not intended to replace advice given to you by your health care provider. Make sure you discuss any questions you have with your healthcare provider. Document Revised: 05/25/2020 Document Reviewed: 01/06/2020 Elsevier Patient Education  2022 Else[employer] Inc.  Diabetes Mellitus and Foot Care Foot care is an important part of your health, especially when you have diabetes. Diabetes may cause you to have problems because of poor blood flow (circulation)  to your feet and legs, which can cause your skin to: Become thinner and drier. Break more easily. Heal more slowly. Peel and crack. You may also have nerve damage (neuropathy) in your legs and feet, causing decreased feeling in them. This means that you may not notice minor injuries to your feet that could lead to more serious problems. Noticing and addressing any potential problems early is the best wayto prevent future foot problems. How to care for your feet Foot hygiene  Wash your feet daily with warm water and mild soap. Do not use hot water. Then, pat your feet and the areas between your toes until they are completely dry. Do not soak your feet as this can dry your skin. Trim your  toenails straight across. Do not dig under them or around the cuticle. File the edges of your nails with an emery board or nail file. Apply a moisturizing lotion or petroleum jelly to the skin on your feet and to dry, brittle toenails. Use lotion that does not contain alcohol and is unscented. Do not apply lotion between your toes.  Shoes and socks Wear clean socks or stockings every day. Make sure they are not too tight. Do not wear knee-high stockings since they may decrease blood flow to your legs. Wear shoes that fit properly and have enough cushioning. Always look in your shoes before you put them on to be sure there are no objects inside. To break in new shoes, wear them for just a few hours a day. This prevents injuries on your feet. Wounds, scrapes, corns, and calluses  Check your feet daily for blisters, cuts, bruises, sores, and redness. If you cannot see the bottom of your feet, use a mirror or ask someone for help. Do not cut corns or calluses or try to remove them with medicine. If you find a minor scrape, cut, or break in the skin on your feet, keep it and the skin around it clean and dry. You may clean these areas with mild soap and water. Do not clean the area with peroxide, alcohol, or iodine. If you have a wound, scrape, corn, or callus on your foot, look at it several times a day to make sure it is healing and not infected. Check for: Redness, swelling, or pain. Fluid or blood. Warmth. Pus or a bad smell.  General tips Do not cross your legs. This may decrease blood flow to your feet. Do not use heating pads or hot water bottles on your feet. They may burn your skin. If you have lost feeling in your feet or legs, you may not know this is happening until it is too late. Protect your feet from hot and cold by wearing shoes, such as at the beach or on hot pavement. Schedule a complete foot exam at least once a year (annually) or more often if you have foot problems. Report any  cuts, sores, or bruises to your health care provider immediately. Where to find more information American Diabetes Association: www.diabetes.org Association of Diabetes Care & Education Specialists: www.diabeteseducator.org Contact a health care provider if: You have a medical condition that increases your risk of infection and you have any cuts, sores, or bruises on your feet. You have an injury that is not healing. You have redness on your legs or feet. You feel burning or tingling in your legs or feet. You have pain or cramps in your legs and feet. Your legs or feet are numb. Your feet always feel cold.  You have pain around any toenails. Get help right away if: You have a wound, scrape, corn, or callus on your foot and: You have pain, swelling, or redness that gets worse. You have fluid or blood coming from the wound, scrape, corn, or callus. Your wound, scrape, corn, or callus feels warm to the touch. You have pus or a bad smell coming from the wound, scrape, corn, or callus. You have a fever. You have a red line going up your leg. Summary Check your feet every day for blisters, cuts, bruises, sores, and redness. Apply a moisturizing lotion or petroleum jelly to the skin on your feet and to dry, brittle toenails. Wear shoes that fit properly and have enough cushioning. If you have foot problems, report any cuts, sores, or bruises to your health care provider immediately. Schedule a complete foot exam at least once a year (annually) or more often if you have foot problems. This information is not intended to replace advice given to you by your health care provider. Make sure you discuss any questions you have with your healthcare provider. Document Revised: 12/31/2019 Document Reviewed: 12/31/2019 Elsevier Patient Education  2022 [employer].  Fall Prevention in the Home, Adult Falls can cause injuries and can happen to people of all ages. There are many things you can do to make  your home safe and to help prevent falls. Ask forhelp when making these changes. What actions can I take to prevent falls? General Instructions Use good lighting in all rooms. Replace any light bulbs that burn out. Turn on the lights in dark areas. Use night-lights. Keep items that you use often in easy-to-reach places. Lower the shelves around your home if needed. Set up your furniture so you have a clear path. Avoid moving your furniture around. Do not have throw rugs or other things on the floor that can make you trip. Avoid walking on wet floors. If any of your floors are uneven, fix them. Add color or contrast paint or tape to clearly mark and help you see: Grab bars or handrails. First and last steps of staircases. Where the edge of each step is. If you use a stepladder: Make sure that it is fully opened. Do not climb a closed stepladder. Make sure the sides of the stepladder are locked in place. Ask someone to hold the stepladder while you use it. Know where your pets are when moving through your home. What can I do in the bathroom?     Keep the floor dry. Clean up any water on the floor right away. Remove soap buildup in the tub or shower. Use nonskid mats or decals on the floor of the tub or shower. Attach bath mats securely with double-sided, nonslip rug tape. If you need to sit down in the shower, use a plastic, nonslip stool. Install grab bars by the toilet and in the tub and shower. Do not use towel bars as grab bars. What can I do in the bedroom? Make sure that you have a light by your bed that is easy to reach. Do not use any sheets or blankets for your bed that hang to the floor. Have a firm chair with side arms that you can use for support when you get dressed. What can I do in the kitchen? Clean up any spills right away. If you need to reach something above you, use a step stool with a grab bar. Keep electrical cords out of the way. Do not use floor  polish or  wax that makes floors slippery. What can I do with my stairs? Do not leave any items on the stairs. Make sure that you have a light switch at the top and the bottom of the stairs. Make sure that there are handrails on both sides of the stairs. Fix handrails that are broken or loose. Install nonslip stair treads on all your stairs. Avoid having throw rugs at the top or bottom of the stairs. Choose a carpet that does not hide the edge of the steps on the stairs. Check carpeting to make sure that it is firmly attached to the stairs. Fix carpet that is loose or worn. What can I do on the outside of my home? Use bright outdoor lighting. Fix the edges of walkways and driveways and fix any cracks. Remove anything that might make you trip as you walk through a door, such as a raised step or threshold. Trim any bushes or trees on paths to your home. Check to see if handrails are loose or broken and that both sides of all steps have handrails. Install guardrails along the edges of any raised decks and porches. Clear paths of anything that can make you trip, such as tools or rocks. Have leaves, snow, or ice cleared regularly. Use sand or salt on paths during winter. Clean up any spills in your garage right away. This includes grease or oil spills. What other actions can I take? Wear shoes that: Have a low heel. Do not wear high heels. Have rubber bottoms. Feel good on your feet and fit well. Are closed at the toe. Do not wear open-toe sandals. Use tools that help you move around if needed. These include: Canes. Walkers. Scooters. Crutches. Review your medicines with your doctor. Some medicines can make you feel dizzy. This can increase your chance of falling. Ask your doctor what else you can do to help prevent falls. Where to find more information Centers for Disease Control and Prevention, [employer]: FootballExhibition.com.brwww.cdc.gov [employer] Aging: https://walker.com/www.nia.nih.gov Contact a doctor if: You are afraid  of falling at home. You feel weak, drowsy, or dizzy at home. You fall at home. Summary There are many simple things that you can do to make your home safe and to help prevent falls. Ways to make your home safe include removing things that can make you trip and installing grab bars in the bathroom. Ask for help when making these changes in your home. This information is not intended to replace advice given to you by your health care provider. Make sure you discuss any questions you have with your healthcare provider. Document Revised: 01/13/2020 Document Reviewed: 01/13/2020 Elsevier Patient Education  2022 Elsevier Inc.  Health Maintenance, Male Adopting a healthy lifestyle and getting preventive care are important in promoting health and wellness. Ask your health care provider about: The right schedule for you to have regular tests and exams. Things you can do on your own to prevent diseases and keep yourself healthy. What should I know about diet, weight, and exercise? Eat a healthy diet  Eat a diet that includes plenty of vegetables, fruits, low-fat dairy products, and lean protein. Do not eat a lot of foods that are high in solid fats, added sugars, or sodium.  Maintain a healthy weight Body mass index (BMI) is a measurement that can be used to identify possible weight problems. It estimates body fat based on height and weight. Your health care provider can help determine your BMI and help you  achieve or maintain ahealthy weight. Get regular exercise Get regular exercise. This is one of the most important things you can do for your health. Most adults should: Exercise for at least 150 minutes each week. The exercise should increase your heart rate and make you sweat (moderate-intensity exercise). Do strengthening exercises at least twice a week. This is in addition to the moderate-intensity exercise. Spend less time sitting. Even light physical activity can be beneficial. Watch  cholesterol and blood lipids Have your blood tested for lipids and cholesterol at 66 years of age, then havethis test every 5 years. You may need to have your cholesterol levels checked more often if: Your lipid or cholesterol levels are high. You are older than 66 years of age. You are at high risk for heart disease. What should I know about cancer screening? Many types of cancers can be detected early and may often be prevented. Depending on your health history and family history, you may need to have cancer screening at various ages. This may include screening for: Colorectal cancer. Prostate cancer. Skin cancer. Lung cancer. What should I know about heart disease, diabetes, and high blood pressure? Blood pressure and heart disease High blood pressure causes heart disease and increases the risk of stroke. This is more likely to develop in people who have high blood pressure readings, are of African descent, or are overweight. Talk with your health care provider about your target blood pressure readings. Have your blood pressure checked: Every 3-5 years if you are 35-76 years of age. Every year if you are 64 years old or older. If you are between the ages of 21 and 69 and are a current or former smoker, ask your health care provider if you should have a one-time screening for abdominal aortic aneurysm (AAA). Diabetes Have regular diabetes screenings. This checks your fasting blood sugar level. Have the screening done: Once every three years after age 91 if you are at a normal weight and have a low risk for diabetes. More often and at a younger age if you are overweight or have a high risk for diabetes. What should I know about preventing infection? Hepatitis B If you have a higher risk for hepatitis B, you should be screened for this virus. Talk with your health care provider to find out if you are at risk forhepatitis B infection. Hepatitis C Blood testing is recommended for: Everyone  born from [birth year] through 1966. Anyone with known risk factors for hepatitis C. Sexually transmitted infections (STIs) You should be screened each year for STIs, including gonorrhea and chlamydia, if: You are sexually active and are younger than 66 years of age. You are older than 66 years of age and your health care provider tells you that you are at risk for this type of infection. Your sexual activity has changed since you were last screened, and you are at increased risk for chlamydia or gonorrhea. Ask your health care provider if you are at risk. Ask your health care provider about whether you are at high risk for HIV. Your health care provider may recommend a prescription medicine to help prevent HIV infection. If you choose to take medicine to prevent HIV, you should first get tested for HIV. You should then be tested every 3 months for as long as you are taking the medicine. Follow these instructions at home: Lifestyle Do not use any products that contain nicotine or tobacco, such as cigarettes, e-cigarettes, and chewing tobacco. If you need help quitting,  ask your health care provider. Do not use street drugs. Do not share needles. Ask your health care provider for help if you need support or information about quitting drugs. Alcohol use Do not drink alcohol if your health care provider tells you not to drink. If you drink alcohol: Limit how much you have to 0-2 drinks a day. Be aware of how much alcohol is in your drink. In the [place] one drink equals one 12 oz bottle of beer (355 mL), one 5 oz glass of wine (148 mL), or one 1 oz glass of hard liquor (44 mL). General instructions Schedule regular health, dental, and eye exams. Stay current with your vaccines. Tell your health care provider if: You often feel depressed. You have ever been abused or do not feel safe at home. Summary Adopting a healthy lifestyle and getting preventive care are important in promoting health and  wellness. Follow your health care provider's instructions about healthy diet, exercising, and getting tested or screened for diseases. Follow your health care provider's instructions on monitoring your cholesterol and blood pressure. This information is not intended to replace advice given to you by your health care provider. Make sure you discuss any questions you have with your healthcare provider. Document Revised: 06/04/2018 Document Reviewed: 06/04/2018 Elsevier Patient Education  2022 Elsevier Inc.  Steps to Quit Smoking Smoking tobacco is the leading cause of preventable death. It can affect almost every organ in the body. Smoking puts you and people around you at risk for many serious, long-lasting (chronic) diseases. Quitting smoking can be hard, but it is one of the best things thatyou can do for your health. It is never too late to quit. How do I get ready to quit? When you decide to quit smoking, make a plan to help you succeed. Before you quit: Pick a date to quit. Set a date within the next 2 weeks to give you time to prepare. Write down the reasons why you are quitting. Keep this list in places where you will see it often. Tell your family, friends, and co-workers that you are quitting. Their support is important. Talk with your doctor about the choices that may help you quit. Find out if your health insurance will pay for these treatments. Know the people, places, things, and activities that make you want to smoke (triggers). Avoid them. What first steps can I take to quit smoking? Throw away all cigarettes at home, at work, and in your car. Throw away the things that you use when you smoke, such as ashtrays and lighters. Clean your car. Make sure to empty the ashtray. Clean your home, including curtains and carpets. What can I do to help me quit smoking? Talk with your doctor about taking medicines and seeing a counselor at the same time. You are more likely to succeed when  you do both. If you are pregnant or breastfeeding, talk with your doctor about counseling or other ways to quit smoking. Do not take medicine to help you quit smoking unless your doctor tells you to do so. To quit smoking: Quit right away Quit smoking totally, instead of slowly cutting back on how much you smoke over a period of time. Go to counseling. You are more likely to quit if you go to counseling sessions regularly. Take medicine You may take medicines to help you quit. Some medicines need a prescription, and some you can buy over-the-counter. Some medicines may contain a drug called nicotine to replace the nicotine in  cigarettes. Medicines may: Help you to stop having the desire to smoke (cravings). Help to stop the problems that come when you stop smoking (withdrawal symptoms). Your doctor may ask you to use: Nicotine patches, gum, or lozenges. Nicotine inhalers or sprays. Non-nicotine medicine that is taken by mouth. Find resources Find resources and other ways to help you quit smoking and remain smoke-free after you quit. These resources are most helpful when you use them often. They include: Online chats with a Veterinary surgeon. Phone quitlines. Printed Materials engineer. Support groups or group counseling. Text messaging programs. Mobile phone apps. Use apps on your mobile phone or tablet that can help you stick to your quit plan. There are many free apps for mobile phones and tablets as well as websites. Examples include Quit Guide from the [employer] and smokefree.gov  What things can I do to make it easier to quit?  Talk to your family and friends. Ask them to support and encourage you. Call a phone quitline (1-800-QUIT-NOW), reach out to support groups, or work with a Veterinary surgeon. Ask people who smoke to not smoke around you. Avoid places that make you want to smoke, such as: Bars. Parties. Smoke-break areas at work. Spend time with people who do not smoke. Lower the stress in  your life. Stress can make you want to smoke. Try these things to help your stress: Getting regular exercise. Doing deep-breathing exercises. Doing yoga. Meditating. Doing a body scan. To do this, close your eyes, focus on one area of your body at a time from head to toe. Notice which parts of your body are tense. Try to relax the muscles in those areas. How will I feel when I quit smoking? Day 1 to 3 weeks Within the first 24 hours, you may start to have some problems that come from quitting tobacco. These problems are very bad 2-3 days after you quit, but they do not often last for more than 2-3 weeks. You may get these symptoms: Mood swings. Feeling restless, nervous, angry, or annoyed. Trouble concentrating. Dizziness. Strong desire for high-sugar foods and nicotine. Weight gain. Trouble pooping (constipation). Feeling like you may vomit (nausea). Coughing or a sore throat. Changes in how the medicines that you take for other issues work in your body. Depression. Trouble sleeping (insomnia). Week 3 and afterward After the first 2-3 weeks of quitting, you may start to notice more positive results, such as: Better sense of smell and taste. Less coughing and sore throat. Slower heart rate. Lower blood pressure. Clearer skin. Better breathing. Fewer sick days. Quitting smoking can be hard. Do not give up if you fail the first time. Some people need to try a few times before they succeed. Do your best to stick to your quit plan, and talk with yourdoctor if you have any questions or concerns. Summary Smoking tobacco is the leading cause of preventable death. Quitting smoking can be hard, but it is one of the best things that you can do for your health. When you decide to quit smoking, make a plan to help you succeed. Quit smoking right away, not slowly over a period of time. When you start quitting, seek help from your doctor, family, or friends. This information is not intended to  replace advice given to you by your health care provider. Make sure you discuss any questions you have with your healthcare provider. Document Revised: 03/06/2019 Document Reviewed: 08/30/2018 Elsevier Patient Education  2022 [employer].

## 2021-01-02 NOTE — Progress Notes (Signed)
Subjective:   Frederick Moody is a 65 y.o. male who presents for Medicare Annual/Subsequent preventive examination.  Review of Systems     Review of Systems  Constitutional: Negative.   HENT: Negative.    Eyes: Negative.   Respiratory: Negative.    Cardiovascular: Negative.   Gastrointestinal: Negative.   Genitourinary: Negative.   Musculoskeletal: Negative.   Skin: Negative.   Neurological: Negative.   Endo/Heme/Allergies: Negative.   Psychiatric/Behavioral: Negative.      Cardiac Risk Factors include: advanced age (>14mn, >>70women);male gender;obesity (BMI >30kg/m2);diabetes mellitus     Objective:    Today's Vitals   01/02/21 1456  BP: 124/64  Pulse: 77  Resp: 18  Temp: 97.9 F (36.6 C)  SpO2: 100%  Weight: 198 lb (89.8 kg)  Height: 5' 9" (1.753 m)   Body mass index is 29.24 kg/m.  Advanced Directives 01/02/2021 08/19/2019 01/07/2019 01/07/2019  Does Patient Have a Medical Advance Directive? No No No No  Would patient like information on creating a medical advance directive? No - Patient declined No - Patient declined No - Patient declined No - Patient declined    Current Medications (verified) Outpatient Encounter Medications as of 01/02/2021  Medication Sig   gabapentin (NEURONTIN) 300 MG capsule TAKE 2 CAPSULES (600 MG TOTAL) BY MOUTH AT BEDTIME.   Accu-Chek Softclix Lancets lancets Use as instructed three times daily before meals DX E11.49   amLODipine (NORVASC) 10 MG tablet Take 1 tablet (10 mg total) by mouth daily.   atorvastatin (LIPITOR) 40 MG tablet Take 1 tablet (40 mg total) by mouth daily.   blood glucose meter kit and supplies Please check your blood sugars twice daily. Once in the morning and once before bed.   Blood Glucose Monitoring Suppl (ACCU-CHEK GUIDE ME) w/Device KIT Use three times daily before meals DX E11.49   Blood Glucose Monitoring Suppl (ACCU-CHEK GUIDE) w/Device KIT USE THREE TIMES DAILY BEFORE MEALS   carvedilol (COREG) 3.125  MG tablet Take 1 tablet (3.125 mg total) by mouth 2 (two) times daily with a meal.   ergocalciferol (DRISDOL) 1.25 MG (50000 UT) capsule Take 1 capsule (50,000 Units total) by mouth once a week.   furosemide (LASIX) 20 MG tablet TAKE 1 TABLET (20 MG TOTAL) BY MOUTH DAILY.   glucose blood (ACCU-CHEK GUIDE) test strip Use as instructed three times daily before meals DX E11.49   insulin degludec (TRESIBA FLEXTOUCH) 100 UNIT/ML FlexTouch Pen INJECT 18 UNITS INTO THE SKIN DAILY.   Insulin Pen Needle 32G X 4 MM MISC use as directed daily   lisinopril (ZESTRIL) 40 MG tablet TAKE 1 TABLET (40 MG TOTAL) BY MOUTH DAILY.   meclizine (ANTIVERT) 25 MG tablet TAKE 1 TABLET (25 MG TOTAL) BY MOUTH 3 (THREE) TIMES DAILY AS NEEDED FOR DIZZINESS.   metFORMIN (GLUCOPHAGE) 500 MG tablet TAKE 2 TABLETS (1,000 MG TOTAL) BY MOUTH 2 (TWO) TIMES DAILY WITH A MEAL.   No facility-administered encounter medications on file as of 01/02/2021.    Allergies (verified) Patient has no known allergies.   History: Past Medical History:  Diagnosis Date   Diabetes mellitus without complication ([facility]    ETOH abuse    Past Surgical History:  Procedure Laterality Date   APPENDECTOMY     IR THORACENTESIS ASP PLEURAL SPACE W/IMG GUIDE  02/06/2019   IRRIGATION AND DEBRIDEMENT ABSCESS N/A 01/13/2019   Procedure: IRRIGATION AND DEBRIDEMENT OF BACK ABSCESS;  Surgeon: TDonnie Mesa MD;  [facility]  Service: General;  Laterality:  N/A;   History reviewed. No pertinent family history. Social History   Socioeconomic History   Marital status: Single    Spouse name: Not on file   Number of children: Not on file   Years of education: Not on file   Highest education level: Not on file  Occupational History   Not on file  Tobacco Use   Smoking status: Never   Smokeless tobacco: Never  Substance and Sexual Activity   Alcohol use: Not Currently   Drug use: Never   Sexual activity: Not on file  Other Topics Concern   Not on  file  Social History Narrative   Not on file   Social Determinants of Health   Financial Resource Strain: Low Risk    Difficulty of Paying Living Expenses: Not very hard  Food Insecurity: No Food Insecurity   Worried About Running Out of Food in the Last Year: Never true   [place] in the Last Year: Never true  Transportation Needs: No Transportation Needs   Lack of Transportation (Medical): No   Lack of Transportation (Non-Medical): No  Physical Activity: Inactive   Days of Exercise per Week: 0 days   Minutes of Exercise per Session: 0 min  Stress: No Stress Concern Present   Feeling of Stress : Not at all  Social Connections: Moderately Integrated   Frequency of Communication with Friends and Family: More than three times a week   Frequency of Social Gatherings with Friends and Family: Once a week   Attends Religious Services: More than 4 times per year   Active Member of [employer] or Organizations: Yes   Attends Music therapist: More than 4 times per year   Marital Status: Divorced    Tobacco Counseling Counseling given: Not Answered   Clinical Intake:  Pre-visit preparation completed: No  Pain : No/denies pain     BMI - recorded: 29.24 Nutritional Status: BMI 25 -29 Overweight Nutritional Risks: None Diabetes: Yes CBG done?: No Did pt. bring in CBG monitor from home?: No  How often do you need to have someone help you when you read instructions, pamphlets, or other written materials from your doctor or pharmacy?: 1 - Never What is the last grade level you completed in school?: 12th  Diabetic?yes  Interpreter Needed?: No      Activities of Daily Living In your present state of health, do you have any difficulty performing the following activities: 01/02/2021  Hearing? N  Vision? Y  Difficulty concentrating or making decisions? N  Walking or climbing stairs? Y  Dressing or bathing? N  Doing errands, shopping? N  Preparing Food and  eating ? N  Using the Toilet? N  In the past six months, have you accidently leaked urine? N  Do you have problems with loss of bowel control? N  Managing your Medications? N  Managing your Finances? N  Housekeeping or managing your Housekeeping? N  Some recent data might be hidden    Patient Care Team: Charlott Rakes, MD as PCP - General (Family Medicine)  Indicate any recent Medical Services you may have received from other than [facility] providers in the past year (date may be approximate).     Assessment:   This is a routine wellness examination for Kollyn.  Dietary issues and exercise activities discussed: Current Exercise Habits: The patient does not participate in regular exercise at present, Exercise limited by: orthopedic condition(s)   Goals Addressed  This Visit's Progress    Cut out extra servings         Depression Screen PHQ 2/9 Scores 01/02/2021 05/26/2020 02/19/2019  PHQ - 2 Score 0 0 1  PHQ- 9 Score - 0 6    Fall Risk Fall Risk  01/02/2021 05/26/2020 08/19/2019 05/13/2019  Falls in the past year? 0 0 0 0  Number falls in past yr: 0 0 - -  Injury with Fall? 0 0 - -  Risk for fall due to : No Fall Risks - - -  Follow up Education provided - - -    FALL RISK PREVENTION PERTAINING TO THE HOME:  Any stairs in or around the home? No  If so, are there any without handrails? No  Home free of loose throw rugs in walkways, pet beds, electrical cords, etc? Yes  Adequate lighting in your home to reduce risk of falls? Yes   ASSISTIVE DEVICES UTILIZED TO PREVENT FALLS:  Life alert? No  Use of a cane, walker or w/c? No  Grab bars in the bathroom? Yes  Shower chair or bench in shower? No  Elevated toilet seat or a handicapped toilet? Yes   TIMED UP AND GO:  Was the test performed? Yes .  Length of time to ambulate 10 feet: 3 sec.   Gait steady and fast without use of assistive device  Cognitive Function: MMSE - Mini Mental State Exam 01/02/2021   Orientation to time 5  Orientation to Place 5  Registration 3  Attention/ Calculation 5  Recall 3  Language- name 2 objects 2  Language- repeat 1  Language- follow 3 step command 3  Language- read & follow direction 1  Write a sentence 1  Copy design 1  Total score 30        Immunizations Immunization History  Administered Date(s) Administered   Influenza,inj,Quad PF,6+ Mos 05/13/2019   Pneumococcal Polysaccharide-23 05/13/2019    TDAP status: Due, Education has been provided regarding the importance of this vaccine. Advised may receive this vaccine at local pharmacy or Health Dept. Aware to provide a copy of the vaccination record if obtained from local pharmacy or Health Dept. Verbalized acceptance and understanding.  Flu Vaccine status: Due, Education has been provided regarding the importance of this vaccine. Advised may receive this vaccine at local pharmacy or Health Dept. Aware to provide a copy of the vaccination record if obtained from local pharmacy or Health Dept. Verbalized acceptance and understanding.  Pneumococcal vaccine status: Due, Education has been provided regarding the importance of this vaccine. Advised may receive this vaccine at local pharmacy or Health Dept. Aware to provide a copy of the vaccination record if obtained from local pharmacy or Health Dept. Verbalized acceptance and understanding.  Covid-19 vaccine status: Declined, Education has been provided regarding the importance of this vaccine but patient still declined. Advised may receive this vaccine at local pharmacy or Health Dept.or vaccine clinic. Aware to provide a copy of the vaccination record if obtained from local pharmacy or Health Dept. Verbalized acceptance and understanding.  Qualifies for Shingles Vaccine? Yes   Zostavax completed No   Shingrix Completed?: No.    Education has been provided regarding the importance of this vaccine. Patient has been advised to call insurance company to  determine out of pocket expense if they have not yet received this vaccine. Advised may also receive vaccine at local pharmacy or Health Dept. Verbalized acceptance and understanding.  Screening Tests Health Maintenance  Topic Date Due  COVID-19 Vaccine (1) Never done   FOOT EXAM  Never done   TETANUS/TDAP  Never done   COLONOSCOPY (Pts 45-85yr Insurance coverage will need to be confirmed)  Never done   Zoster Vaccines- Shingrix (1 of 2) Never done   PNA vac Low Risk Adult (1 of 2 - PCV13) 06/12/2020   HEMOGLOBIN A1C  11/24/2020   INFLUENZA VACCINE  01/23/2021   OPHTHALMOLOGY EXAM  09/27/2021   Hepatitis C Screening  Completed   HIV Screening  Completed   HPV VACCINES  Aged Out    Health Maintenance  Health Maintenance Due  Topic Date Due   COVID-19 Vaccine (1) Never done   FOOT EXAM  Never done   TETANUS/TDAP  Never done   COLONOSCOPY (Pts 45-482yrInsurance coverage will need to be confirmed)  Never done   Zoster Vaccines- Shingrix (1 of 2) Never done   PNA vac Low Risk Adult (1 of 2 - PCV13) 06/12/2020   HEMOGLOBIN A1C  11/24/2020    Colorectal screening: refused  Lung Cancer Screening: (Low Dose CT Chest recommended if Age 66-80ears, 30 pack-year currently smoking OR have quit w/in 15years.) does not qualify.   Lung Cancer Screening Referral: NA  Additional Screening:  Hepatitis C Screening: does qualify; Completed na  Vision Screening: Recommended annual ophthalmology exams for early detection of glaucoma and other disorders of the eye. Is the patient up to date with their annual eye exam?  Yes  Who is the provider or what is the name of the office in which the patient attends annual [facility] exams? [facility] If pt is not established with a provider, would they like to be referred to a provider to establish care? No .   Dental Screening: Recommended annual dental exams for proper oral hygiene  Community Resource Referral / Chronic Care Management: CRR  required this visit?  No   CCM required this visit?  No      Plan:     I have personally reviewed and noted the following in the patient's chart:   Medical and social history Use of alcohol, tobacco or illicit drugs  Current medications and supplements including opioid prescriptions. Patient is not currently taking opioid prescriptions. Functional ability and status Nutritional status Physical activity Advanced directives List of other physicians Hospitalizations, surgeries, and ER visits in previous 12 months Vitals Screenings to include cognitive, depression, and falls Referrals and appointments  In addition, I have reviewed and discussed with patient certain preventive protocols, quality metrics, and best practice recommendations. A written personalized care plan for preventive services as well as general preventive health recommendations were provided to patient.     ToFenton FoyNP   01/02/2021

## 2021-01-10 ENCOUNTER — Ambulatory Visit: Payer: Medicare Other | Attending: Family Medicine | Admitting: Family Medicine

## 2021-01-10 ENCOUNTER — Encounter: Payer: Self-pay | Admitting: Family Medicine

## 2021-01-10 ENCOUNTER — Other Ambulatory Visit: Payer: Self-pay

## 2021-01-10 ENCOUNTER — Ambulatory Visit: Payer: Medicare Other | Admitting: Family Medicine

## 2021-01-10 DIAGNOSIS — R42 Dizziness and giddiness: Secondary | ICD-10-CM | POA: Diagnosis not present

## 2021-01-10 DIAGNOSIS — I152 Hypertension secondary to endocrine disorders: Secondary | ICD-10-CM

## 2021-01-10 DIAGNOSIS — E1159 Type 2 diabetes mellitus with other circulatory complications: Secondary | ICD-10-CM | POA: Diagnosis not present

## 2021-01-10 DIAGNOSIS — E1149 Type 2 diabetes mellitus with other diabetic neurological complication: Secondary | ICD-10-CM

## 2021-01-10 DIAGNOSIS — Z794 Long term (current) use of insulin: Secondary | ICD-10-CM

## 2021-01-10 DIAGNOSIS — E1169 Type 2 diabetes mellitus with other specified complication: Secondary | ICD-10-CM | POA: Diagnosis not present

## 2021-01-10 MED ORDER — MECLIZINE HCL 25 MG PO TABS
ORAL_TABLET | Freq: Three times a day (TID) | ORAL | 6 refills | Status: DC | PRN
  Filled 2021-01-10: qty 30, fill #0
  Filled 2021-02-03: qty 30, 30d supply, fill #0
  Filled 2021-03-03: qty 30, 30d supply, fill #1
  Filled 2021-03-31: qty 30, 30d supply, fill #2
  Filled 2021-04-28: qty 30, 30d supply, fill #3
  Filled 2021-06-05: qty 30, 10d supply, fill #4
  Filled 2021-07-06: qty 30, 10d supply, fill #0
  Filled 2021-08-04: qty 30, 10d supply, fill #1

## 2021-01-10 MED ORDER — FUROSEMIDE 20 MG PO TABS
ORAL_TABLET | Freq: Every day | ORAL | 6 refills | Status: DC
  Filled 2021-01-10: qty 30, fill #0
  Filled 2021-02-03: qty 30, 30d supply, fill #0
  Filled 2021-03-03: qty 30, 30d supply, fill #1
  Filled 2021-03-31: qty 30, 30d supply, fill #2
  Filled 2021-04-28: qty 30, 30d supply, fill #3
  Filled 2021-06-05: qty 30, 30d supply, fill #4
  Filled 2021-07-06: qty 30, 30d supply, fill #0
  Filled 2021-08-04: qty 30, 30d supply, fill #1

## 2021-01-10 MED ORDER — CARVEDILOL 3.125 MG PO TABS
3.1250 mg | ORAL_TABLET | Freq: Two times a day (BID) | ORAL | 6 refills | Status: DC
  Filled 2021-01-10 – 2021-02-03 (×2): qty 60, 30d supply, fill #0
  Filled 2021-03-03: qty 60, 30d supply, fill #1
  Filled 2021-03-31: qty 60, 30d supply, fill #2

## 2021-01-10 MED ORDER — AMLODIPINE BESYLATE 10 MG PO TABS
10.0000 mg | ORAL_TABLET | Freq: Every day | ORAL | 6 refills | Status: DC
  Filled 2021-01-10 – 2021-02-03 (×2): qty 30, 30d supply, fill #0
  Filled 2021-03-03: qty 30, 30d supply, fill #1
  Filled 2021-03-31: qty 30, 30d supply, fill #2
  Filled 2021-04-28: qty 30, 30d supply, fill #3
  Filled 2021-06-05: qty 30, 30d supply, fill #4
  Filled 2021-07-06: qty 30, 30d supply, fill #0
  Filled 2021-08-04: qty 30, 30d supply, fill #1

## 2021-01-10 MED ORDER — LISINOPRIL 40 MG PO TABS
ORAL_TABLET | Freq: Every day | ORAL | 6 refills | Status: DC
  Filled 2021-01-10: qty 30, fill #0
  Filled 2021-02-03: qty 30, 30d supply, fill #0
  Filled 2021-03-03: qty 30, 30d supply, fill #1
  Filled 2021-03-31: qty 30, 30d supply, fill #2

## 2021-01-10 MED ORDER — METFORMIN HCL 500 MG PO TABS
ORAL_TABLET | Freq: Two times a day (BID) | ORAL | 6 refills | Status: DC
  Filled 2021-01-10: qty 120, fill #0
  Filled 2021-02-03: qty 120, 30d supply, fill #0
  Filled 2021-03-03: qty 120, 30d supply, fill #1
  Filled 2021-03-31: qty 120, 30d supply, fill #2
  Filled 2021-04-28: qty 120, 30d supply, fill #3
  Filled 2021-06-05: qty 120, 30d supply, fill #4
  Filled 2021-07-06: qty 120, 30d supply, fill #0
  Filled 2021-08-04: qty 120, 30d supply, fill #1

## 2021-01-10 MED ORDER — ATORVASTATIN CALCIUM 40 MG PO TABS
40.0000 mg | ORAL_TABLET | Freq: Every day | ORAL | 6 refills | Status: DC
  Filled 2021-01-10 – 2021-02-03 (×2): qty 30, 30d supply, fill #0
  Filled 2021-03-03: qty 30, 30d supply, fill #1
  Filled 2021-03-31: qty 30, 30d supply, fill #2
  Filled 2021-04-28: qty 30, 30d supply, fill #3
  Filled 2021-06-05: qty 30, 30d supply, fill #4
  Filled 2021-07-06: qty 30, 30d supply, fill #0
  Filled 2021-08-04: qty 30, 30d supply, fill #1

## 2021-01-10 NOTE — Progress Notes (Signed)
Virtual Visit via Telephone Note  I connected with Frederick Moody, on 01/10/2021 at 5:28 PM by telephone due to the COVID-19 pandemic and verified that I am speaking with the correct person using two identifiers.   Consent: I discussed the limitations, risks, security and privacy concerns of performing an evaluation and management service by telephone and the availability of in person appointments. I also discussed with the patient that there may be a patient responsible charge related to this service. The patient expressed understanding and agreed to proceed.   Location of Patient: Environmental education officer of Provider: Clinic   Persons participating in Telemedicine visit: Frederick Moody Dr. Margarita Rana     History of Present Illness: Frederick Moody  is a 65 year old male with a history of type 2 diabetes mellitus (A1c 5.5), hypertension here for a follow-up visit.   Blood sugars for the highs have been in the 200s which is rare but usually in the 100-130.  At his last visit he had complained of dizziness and his dose of [place] had been decreased.  He has not had any hypoglycemic episodes and neuropathy is controlled, he has no blurry vision. His blood pressures have been normal and that his Medicare physical exam 1 week ago blood pressure was 124/64. His sister and his daughter assist with his medical care. He has no additional concerns today.  Past Medical History:  Diagnosis Date   Diabetes mellitus without complication ([place])    ETOH abuse    No Known Allergies  Current Outpatient Medications on File Prior to Visit  Medication Sig Dispense Refill   gabapentin (NEURONTIN) 300 MG capsule TAKE 2 CAPSULES (600 MG TOTAL) BY MOUTH AT BEDTIME. 60 capsule 6   Accu-Chek Softclix Lancets lancets Use as instructed three times daily before meals DX E11.49 100 each 12   blood glucose meter kit and supplies Please check your blood sugars twice daily. Once in the morning and once before  bed. 1 each 0   Blood Glucose Monitoring Suppl (ACCU-CHEK GUIDE ME) w/Device KIT Use three times daily before meals DX E11.49 1 kit 0   Blood Glucose Monitoring Suppl (ACCU-CHEK GUIDE) w/Device KIT USE THREE TIMES DAILY BEFORE MEALS 1 kit 0   ergocalciferol (DRISDOL) 1.25 MG (50000 UT) capsule Take 1 capsule (50,000 Units total) by mouth once a week. 9 capsule 1   glucose blood (ACCU-CHEK GUIDE) test strip Use as instructed three times daily before meals DX E11.49 100 each 12   insulin degludec (TRESIBA FLEXTOUCH) 100 UNIT/ML FlexTouch Pen INJECT 18 UNITS INTO THE SKIN DAILY. 9 mL 2   Insulin Pen Needle 32G X 4 MM MISC use as directed daily 100 each 0   No current facility-administered medications on file prior to visit.    ROS: See HPI   Observations/Objective: Awake, alert, ranted x3 Not in acute distress   CMP Latest Ref Rng & Units 05/26/2020 11/10/2019 03/10/2019  Glucose 65 - 99 mg/dL 104(H) 78 139(H)  BUN 8 - 27 mg/dL 14 10 18   Creatinine 0.76 - 1.27 mg/dL 0.85 0.74(L) 0.89  Sodium 134 - 144 mmol/L 138 133(L) 138  Potassium 3.5 - 5.2 mmol/L 4.9 5.1 4.5  Chloride 96 - 106 mmol/L 99 95(L) 98  CO2 20 - 29 mmol/L 25 25 24   Calcium 8.6 - 10.2 mg/dL 9.8 9.9 9.2  Total Protein 6.0 - 8.5 g/dL 6.8 6.6 -  Total Bilirubin 0.0 - 1.2 mg/dL 0.5 0.6 -  Alkaline Phos 44 - 121 IU/L 40(L)  45(L) -  AST 0 - 40 IU/L 21 29 -  ALT 0 - 44 IU/L 17 14 -     Lab Results  Component Value Date   HGBA1C 5.5 05/26/2020    Assessment and Plan: 1. Type 2 diabetes mellitus with other specified complication, with long-term current use of insulin (HCC) Controlled with A1c of 5.5 Continue current regimen Counseled on Diabetic diet, my plate method, 361 minutes of moderate intensity exercise/week Blood sugar logs with fasting goals of 80-120 mg/dl, random of less than 180 and in the event of sugars less than 60 mg/dl or greater than 400 mg/dl encouraged to notify the clinic. Advised on the need for  annual eye exams, annual foot exams, Pneumonia vaccine. - metFORMIN (GLUCOPHAGE) 500 MG tablet; TAKE 2 TABLETS (1,000 MG TOTAL) BY MOUTH 2 (TWO) TIMES DAILY WITH A MEAL.  Dispense: 120 tablet; Refill: 6 - atorvastatin (LIPITOR) 40 MG tablet; Take 1 tablet (40 mg total) by mouth daily.  Dispense: 30 tablet; Refill: 6  2. Type 2 diabetes mellitus with other neurologic complication, with long-term current use of insulin (HCC) Controlled  - metFORMIN (GLUCOPHAGE) 500 MG tablet; TAKE 2 TABLETS (1,000 MG TOTAL) BY MOUTH 2 (TWO) TIMES DAILY WITH A MEAL.  Dispense: 120 tablet; Refill: 6  3. Hypertension associated with diabetes ([place]) Controlled Counseled on blood pressure goal of less than 130/80, low-sodium, DASH diet, medication compliance, 150 minutes of moderate intensity exercise per week. Discussed medication compliance, adverse effects. - amLODipine (NORVASC) 10 MG tablet; Take 1 tablet (10 mg total) by mouth daily.  Dispense: 30 tablet; Refill: 6 - carvedilol (COREG) 3.125 MG tablet; Take 1 tablet (3.125 mg total) by mouth 2 (two) times daily with a meal.  Dispense: 60 tablet; Refill: 6 - lisinopril (ZESTRIL) 40 MG tablet; TAKE 1 TABLET (40 MG TOTAL) BY MOUTH DAILY.  Dispense: 30 tablet; Refill: 6 - furosemide (LASIX) 20 MG tablet; TAKE 1 TABLET (20 MG TOTAL) BY MOUTH DAILY.  Dispense: 30 tablet; Refill: 6  4. Vertigo Stable - meclizine (ANTIVERT) 25 MG tablet; TAKE 1 TABLET (25 MG TOTAL) BY MOUTH 3 (THREE) TIMES DAILY AS NEEDED FOR DIZZINESS.  Dispense: 30 tablet; Refill: 6     Follow Up Instructions: 3 months   I discussed the assessment and treatment plan with the patient. The patient was provided an opportunity to ask questions and all were answered. The patient agreed with the plan and demonstrated an understanding of the instructions.   The patient was advised to call back or seek an in-person evaluation if the symptoms worsen or if the condition fails to improve as  anticipated.     I provided 11 minutes total of non-face-to-face time during this encounter.   Charlott Rakes, MD, FAAFP. [facility] [place], [place]   01/10/2021, 5:28 PM

## 2021-02-03 ENCOUNTER — Other Ambulatory Visit: Payer: Self-pay

## 2021-02-06 ENCOUNTER — Other Ambulatory Visit: Payer: Self-pay

## 2021-03-03 ENCOUNTER — Other Ambulatory Visit: Payer: Self-pay

## 2021-03-03 ENCOUNTER — Other Ambulatory Visit: Payer: Self-pay | Admitting: Family Medicine

## 2021-03-03 DIAGNOSIS — E1149 Type 2 diabetes mellitus with other diabetic neurological complication: Secondary | ICD-10-CM

## 2021-03-03 DIAGNOSIS — E1169 Type 2 diabetes mellitus with other specified complication: Secondary | ICD-10-CM

## 2021-03-03 DIAGNOSIS — Z794 Long term (current) use of insulin: Secondary | ICD-10-CM

## 2021-03-03 MED ORDER — TRESIBA FLEXTOUCH 100 UNIT/ML ~~LOC~~ SOPN
PEN_INJECTOR | SUBCUTANEOUS | 2 refills | Status: DC
  Filled 2021-03-03: qty 9, fill #0
  Filled 2021-03-31: qty 9, 50d supply, fill #0
  Filled 2021-04-28 – 2021-06-05 (×2): qty 9, 50d supply, fill #1
  Filled 2021-07-12: qty 9, 50d supply, fill #0

## 2021-03-03 MED ORDER — BD PEN NEEDLE NANO U/F 32G X 4 MM MISC
1.0000 [IU] | Freq: Every day | 0 refills | Status: DC
Start: 2021-03-03 — End: 2021-09-07
  Filled 2021-03-03 – 2021-03-31 (×2): qty 100, 90d supply, fill #0

## 2021-03-20 IMAGING — US SOFT TISSUE ULTRASOUND HEAD/NECK
1 series · 14 of 16 positions shown · non-contrast
Comparison: None.

CLINICAL DATA: 62-year-old male with possible abscess in the lower
cervical/upper back region

EXAM:
ULTRASOUND OF HEAD/NECK SOFT TISSUES
TECHNIQUE: Ultrasound examination of the head and neck soft tissues was
performed in the area of clinical concern.

[Series 1: soft tissue ultrasound head/neck · 16 acquisitions, 14 frames shown]
[im 1/16]
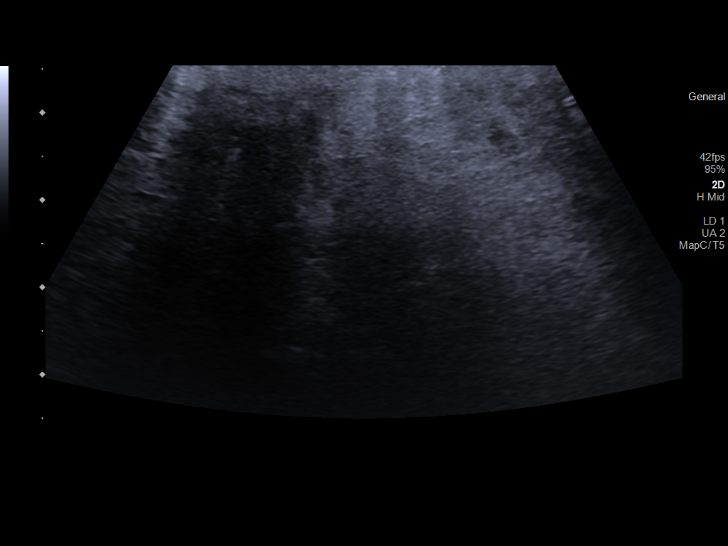
[im 2/16]
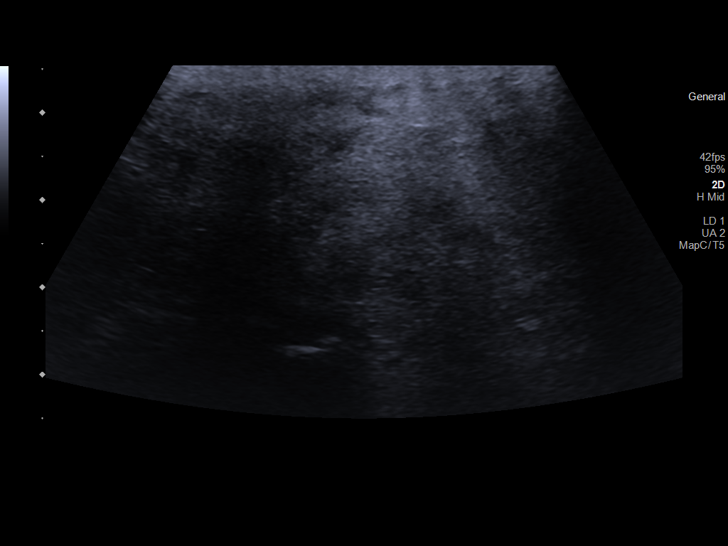
[im 3/16]
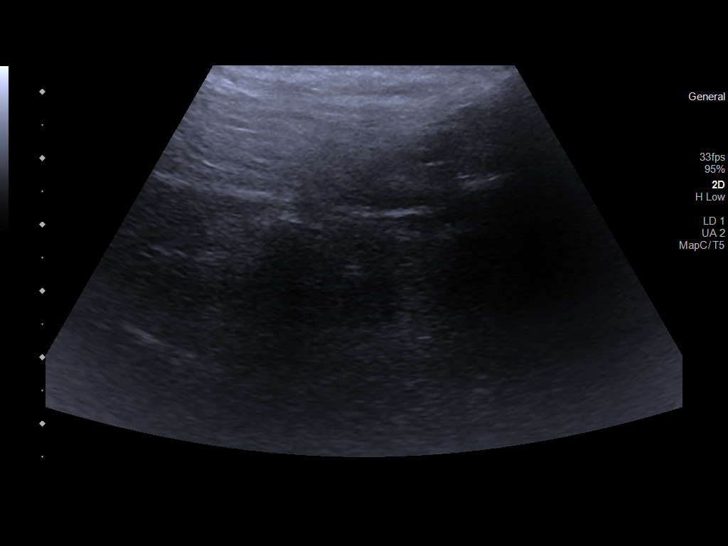
[im 5/16]
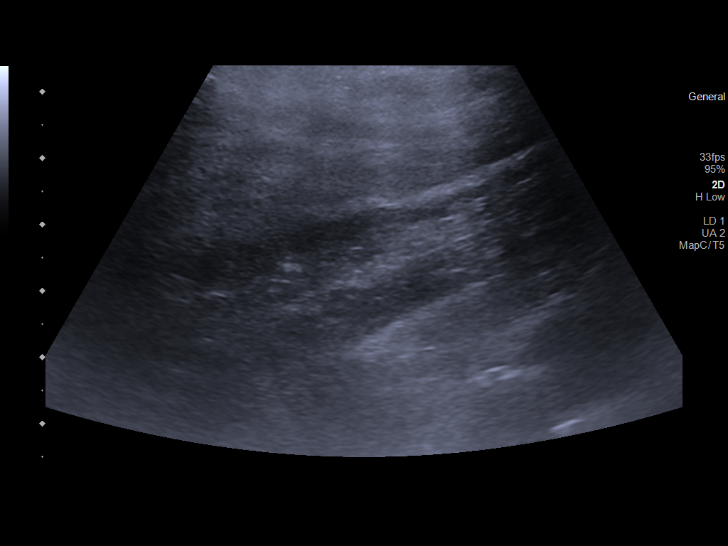
[im 6/16]
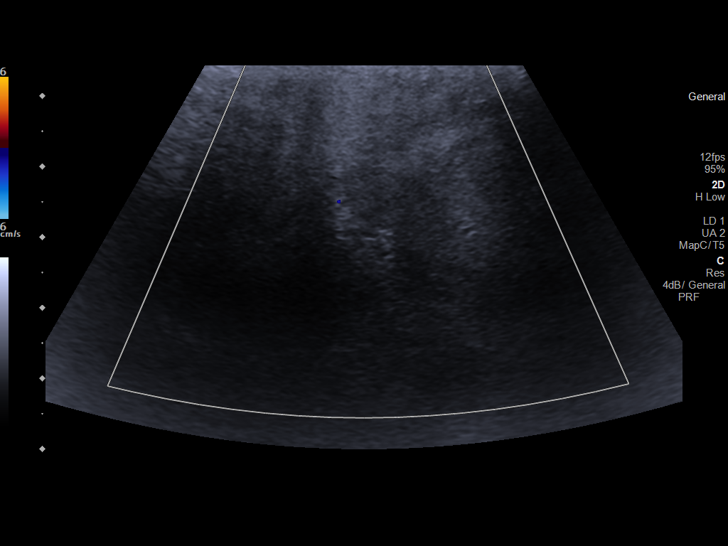
[im 7/16]
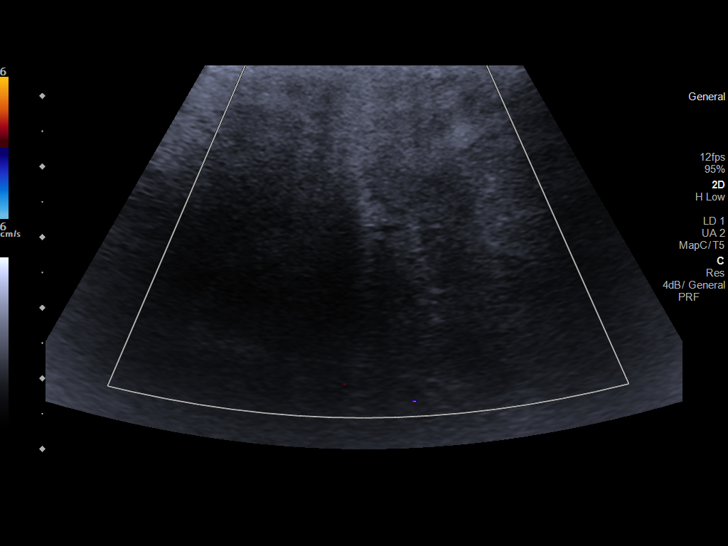
[im 8/16]
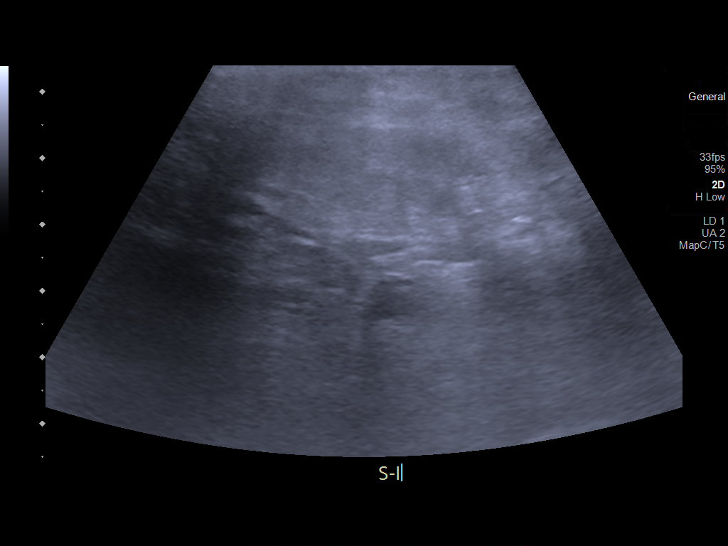
[im 9/16]
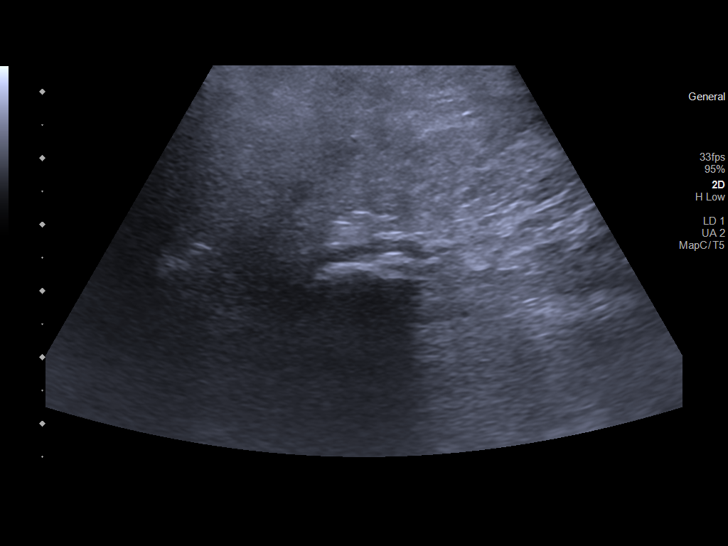
[im 10/16]
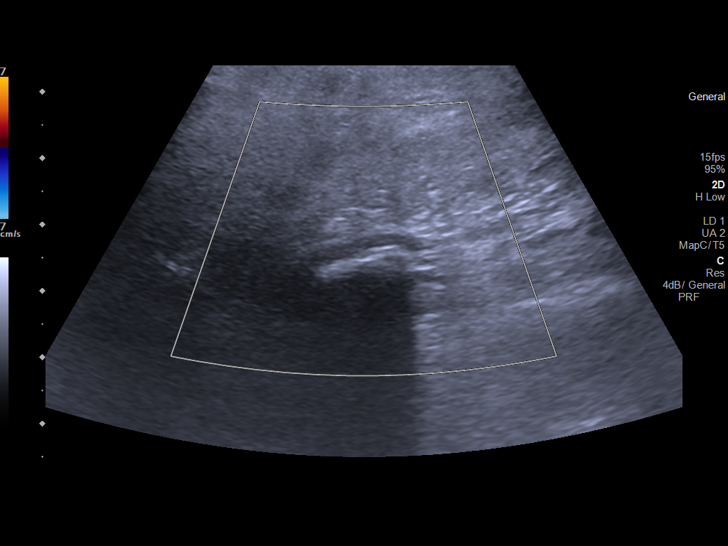
[im 11/16]
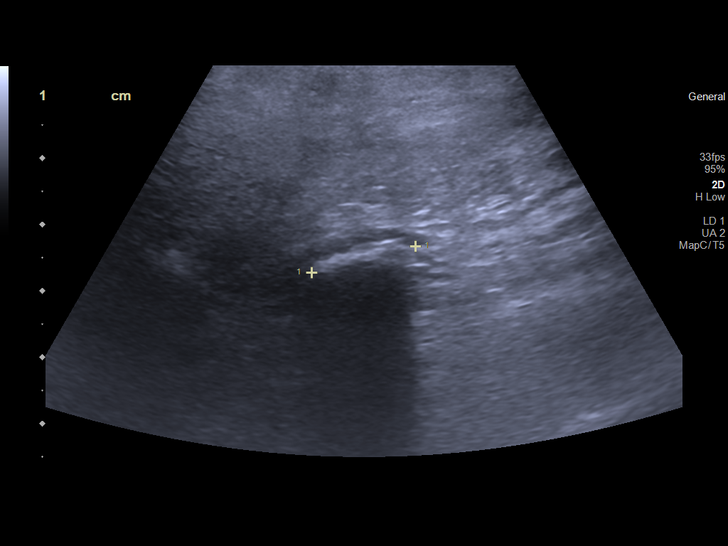
[im 13/16]
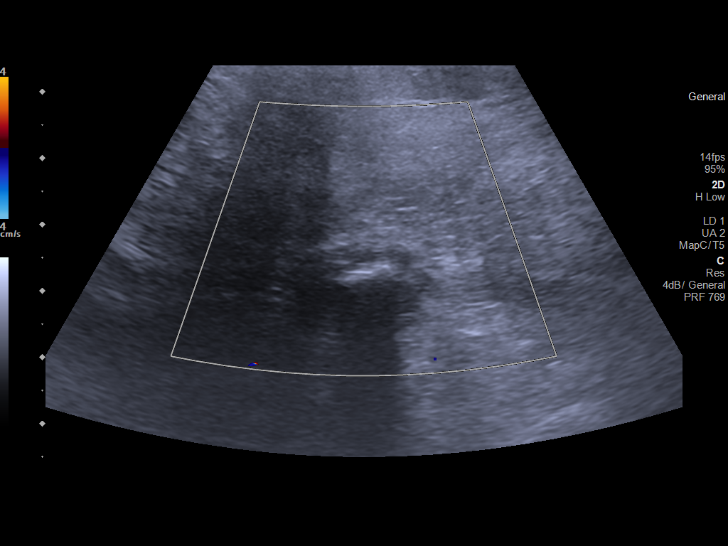
[im 14/16]
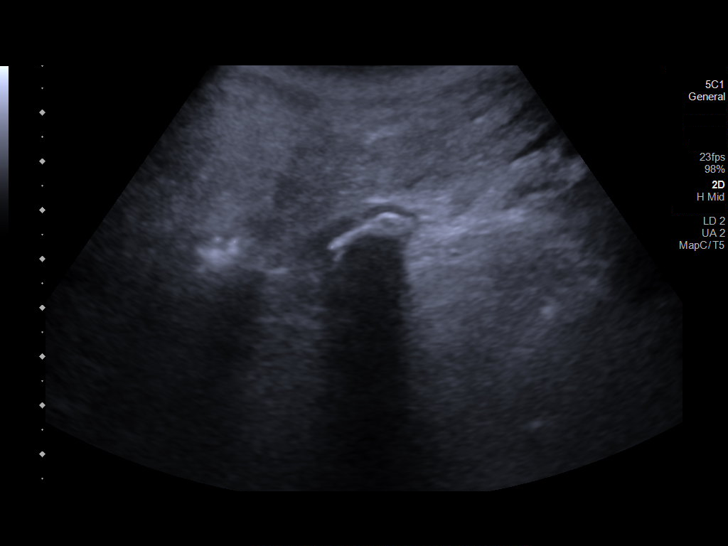
[im 15/16]
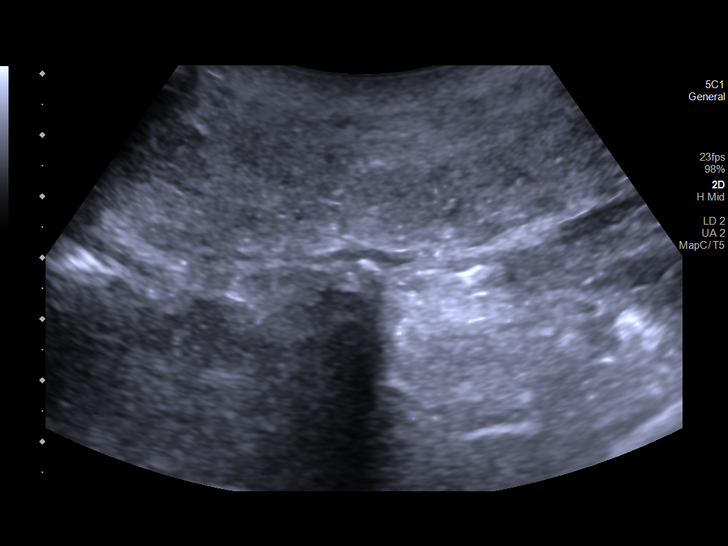
[im 16/16]
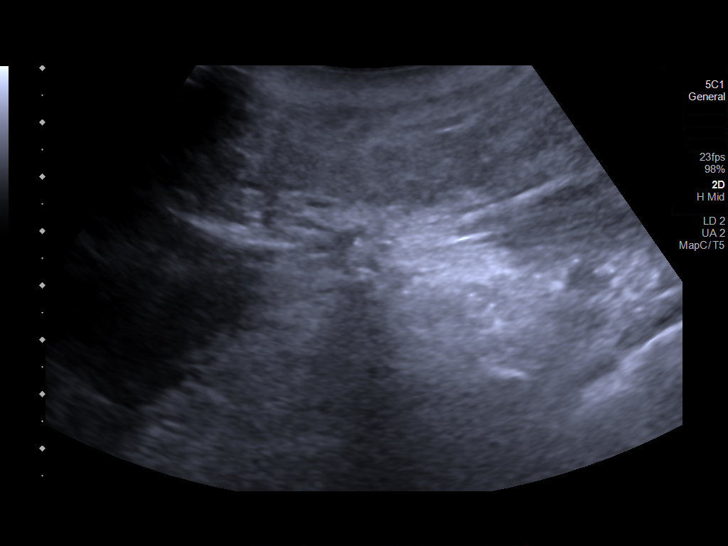

[14 of 16 positions shown; findings below may reference images not displayed]

FINDINGS: Sonographic interrogation of the region of clinical concern
demonstrates thickened skin and normal underlying subcutaneous fat.
No focal fluid collection to suggest abscess. The soft tissues are
fairly dense which limits penetration and evaluation.
IMPRESSION: No discrete fluid collection to suggest abscess.

## 2021-03-22 IMAGING — DX PORTABLE CHEST - 1 VIEW
1 series · 1 of 1 positions shown · non-contrast
Comparison: Prior chest x-ray 01/07/2019

CLINICAL DATA: 62-year-old male with fever, history of diabetes and
alcohol abuse

EXAM:
PORTABLE CHEST 1 VIEW

[chest ap]
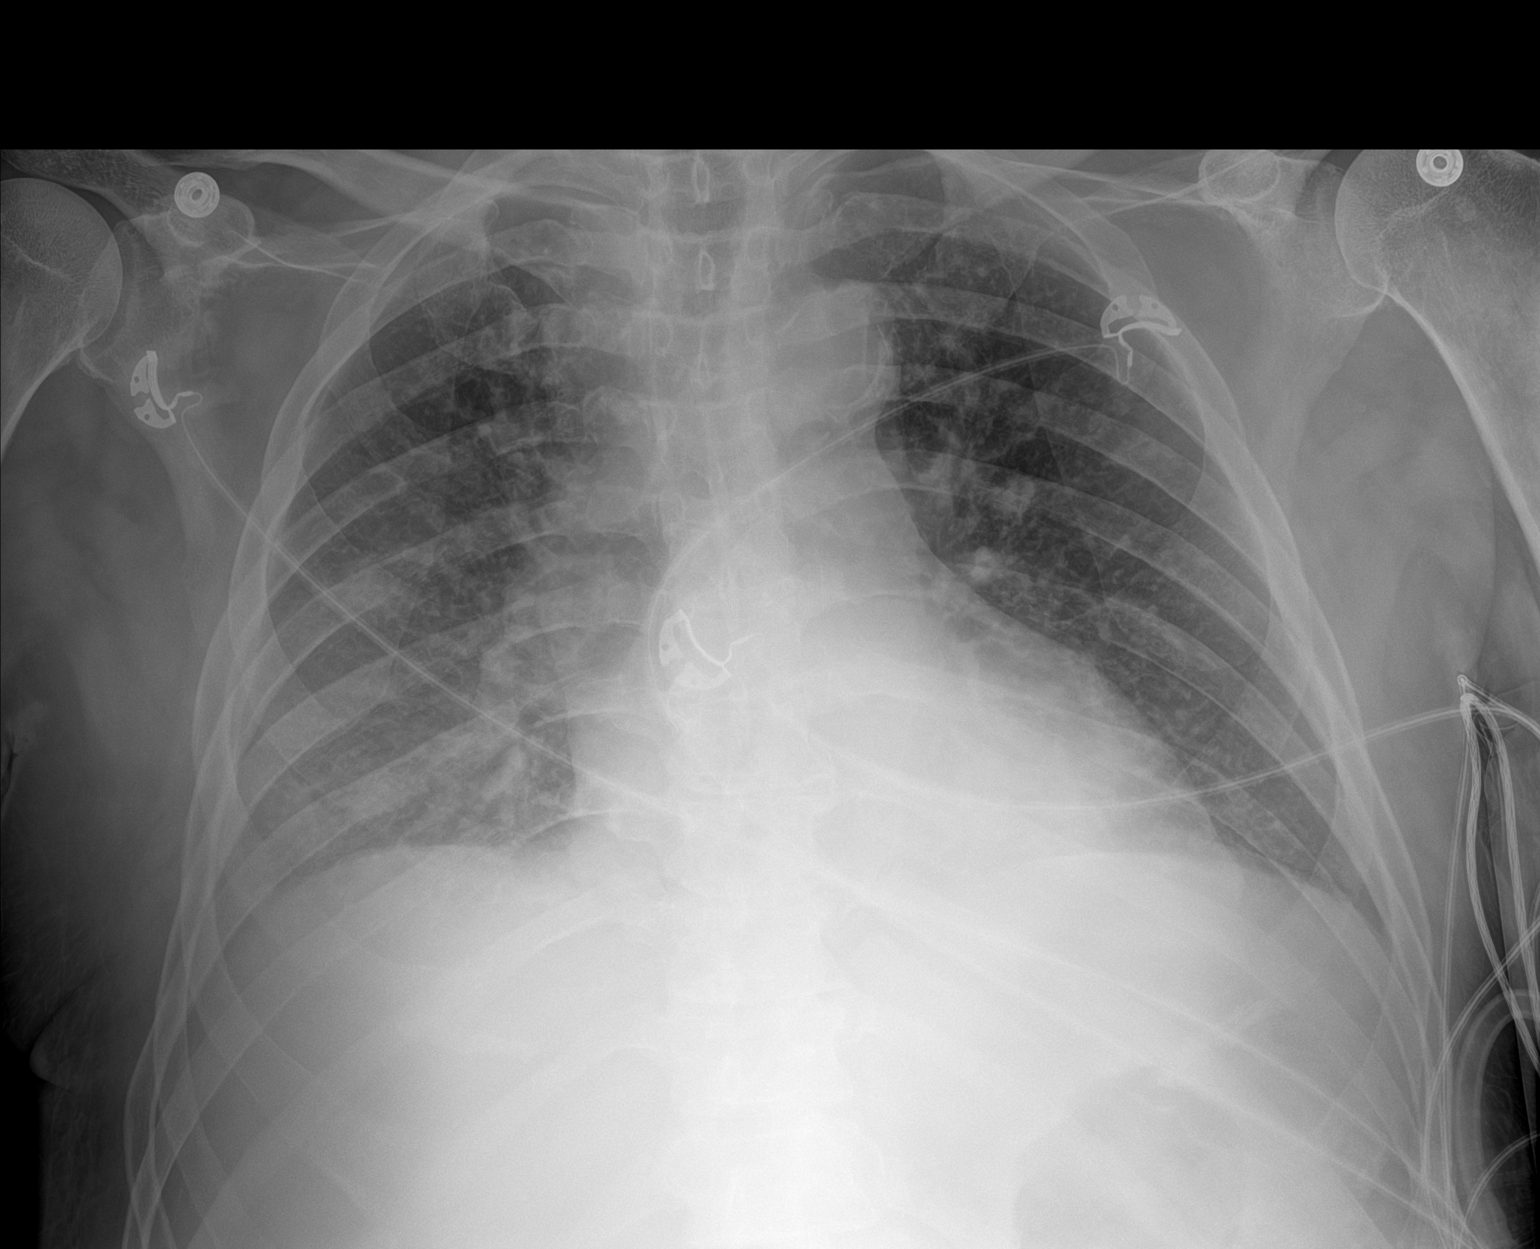

[1 of 1 positions shown; findings below may reference images not displayed]

FINDINGS: New patchy right mid and lower lung airspace opacity. Additionally,
there may be some patchy airspace opacity in the retrocardiac region
of the left lower lobe. Inspiratory volumes are low. There is mild
vascular congestion. Borderline cardiomegaly. Atherosclerotic
calcifications present in the transverse aorta. No pneumothorax. No
acute osseous abnormality.
IMPRESSION: 1. New patchy airspace opacities in the right mid lung, right base
and left retrocardiac region. Differential considerations include
multifocal pneumonia, aspiration pneumonitis, and, in the
appropriate clinical setting, atypical/viral pneumonia.
2. Cardiomegaly with mild vascular congestion but no overt pulmonary
edema.
3.  Aortic Atherosclerosis ([id number]-170.0)

## 2021-03-23 IMAGING — DX PORTABLE CHEST - 1 VIEW
1 series · 1 of 1 positions shown · non-contrast
Comparison: January 11, 2019

CLINICAL DATA: Intubation

EXAM:
PORTABLE CHEST 1 VIEW

[chest ap]
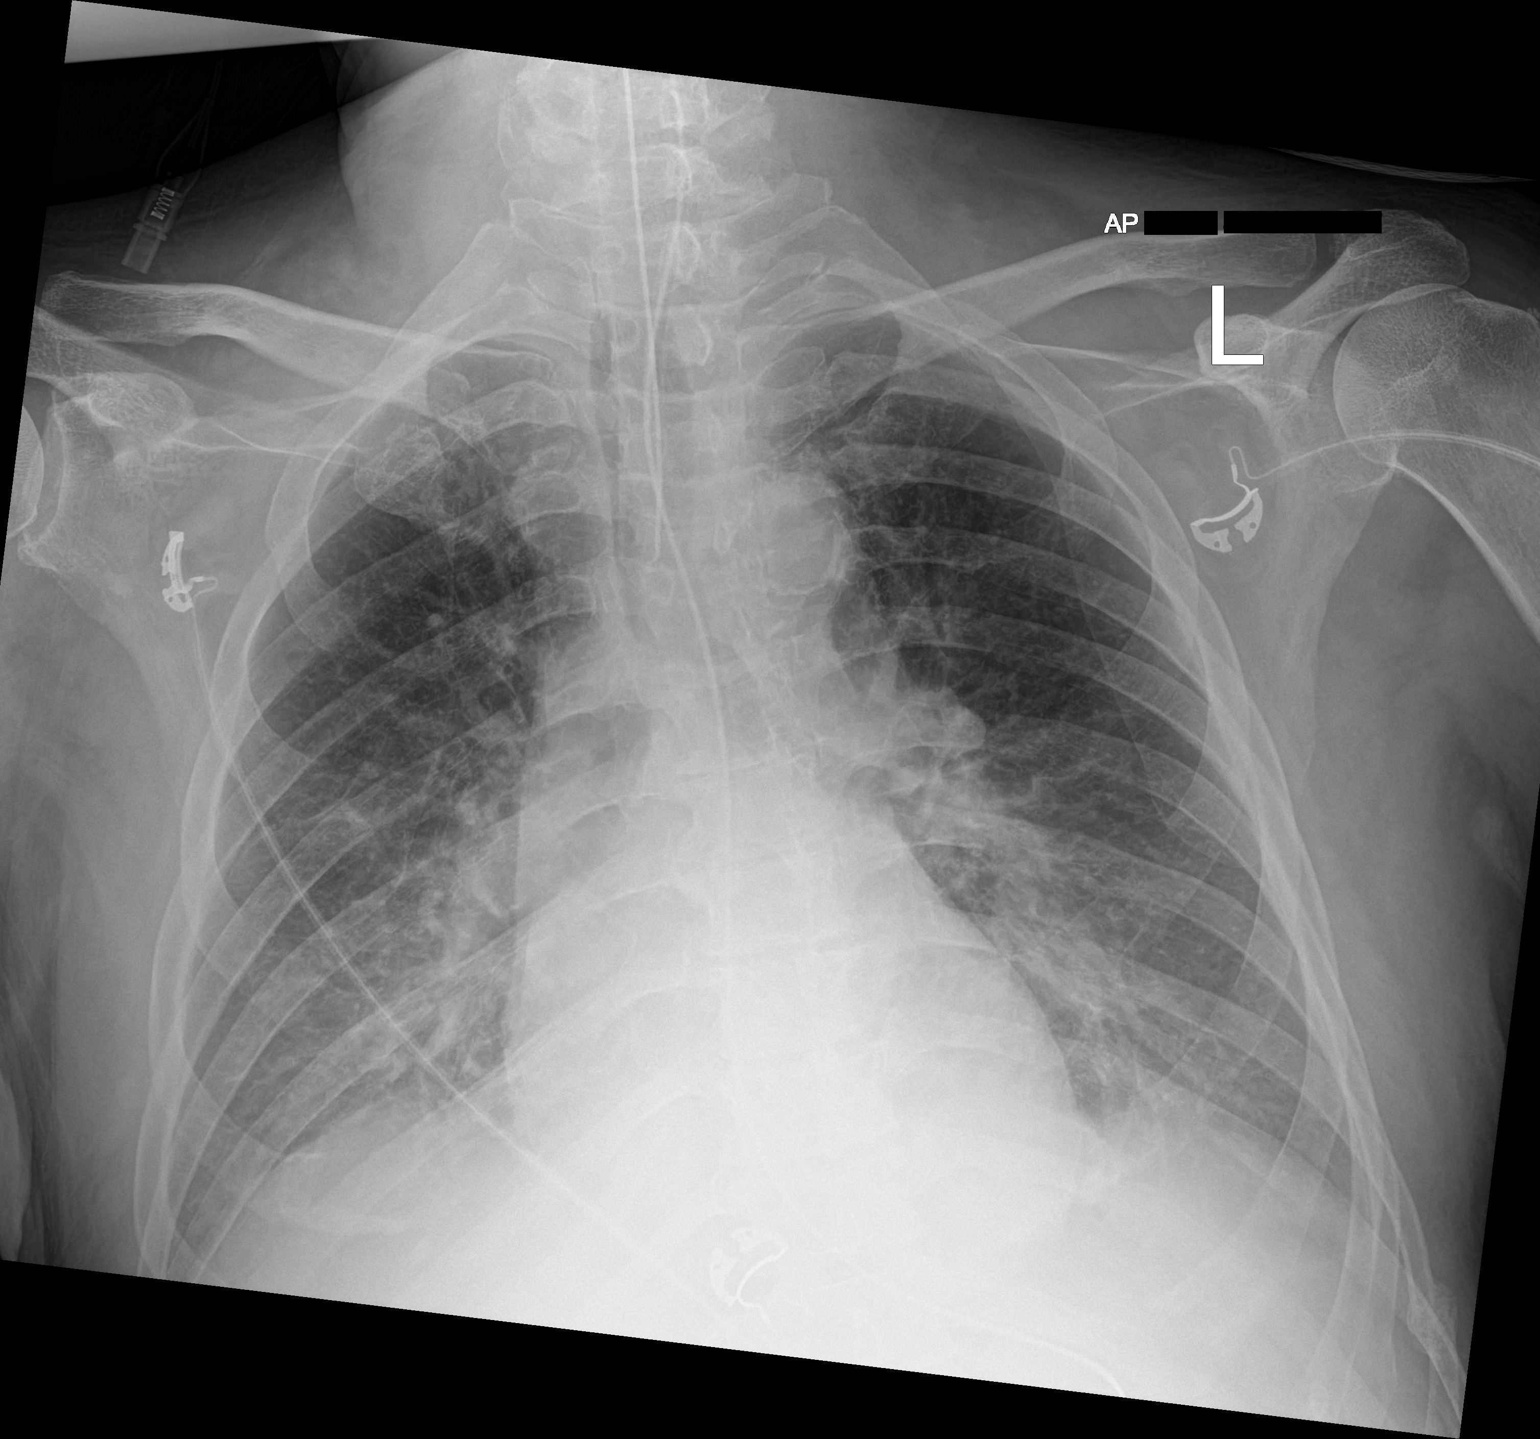

[1 of 1 positions shown; findings below may reference images not displayed]

FINDINGS: There has been interval placement of an endotracheal tube tip
terminates approximately 3 cm above the carina. The enteric tube
extends below the left hemidiaphragm. Again noted are diffuse
multifocal bilateral airspace opacities. There are small bilateral
pleural effusions. There is no pneumothorax. The heart size is
stable. Aortic calcifications are noted.
IMPRESSION: Lines and tubes as above.

Persistent bilateral airspace opacities similar to prior study.

Bilateral pleural effusions with short interval increase in size.

## 2021-03-23 IMAGING — DX LEFT ELBOW - COMPLETE 3+ VIEW
4 series · 4 of 4 positions shown · non-contrast
Comparison: None.

CLINICAL DATA: Left elbow swelling.

EXAM:
LEFT ELBOW - COMPLETE 3+ VIEW

[elbow ap]
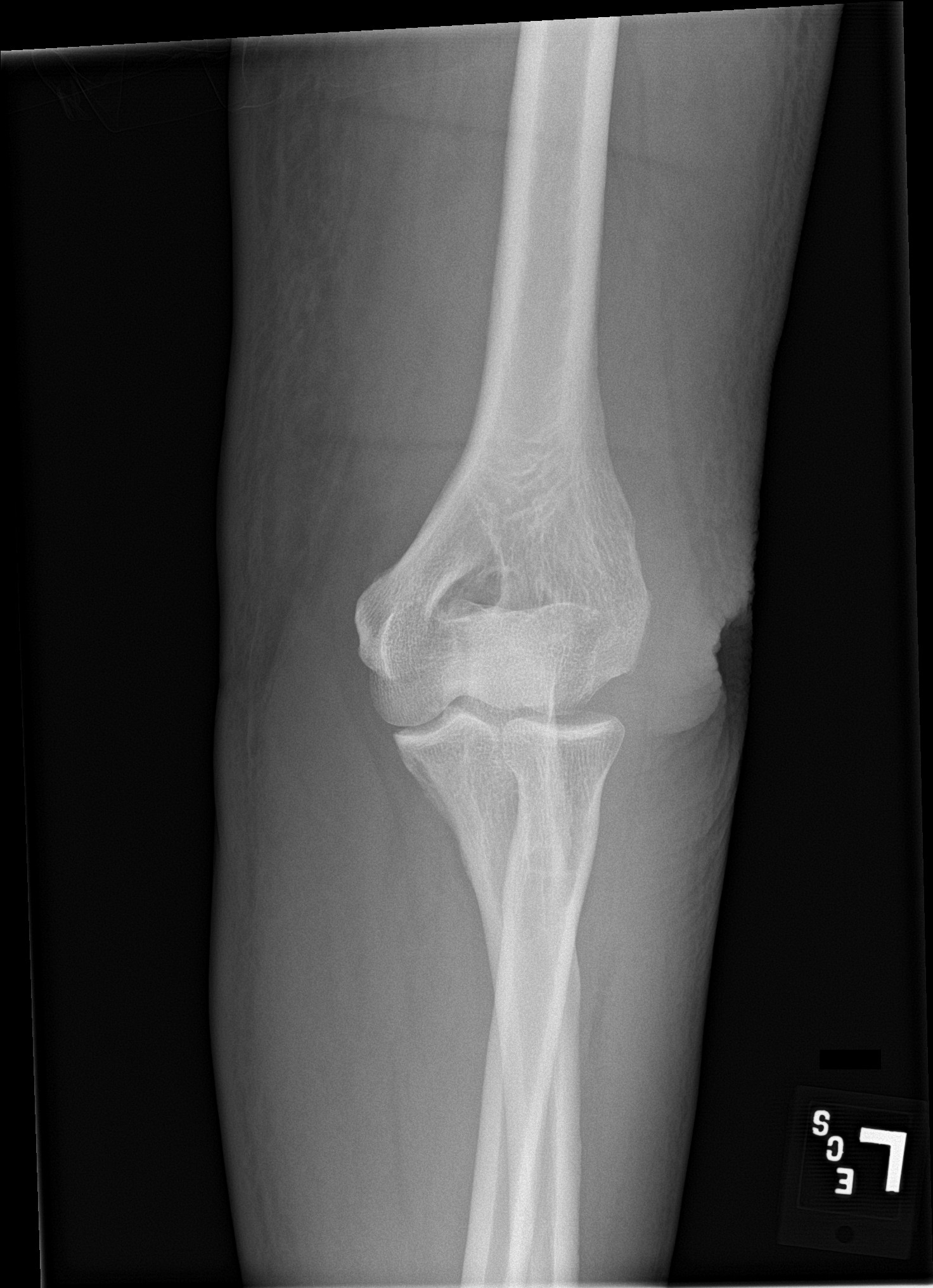

[elbow obl (1 of 2)]
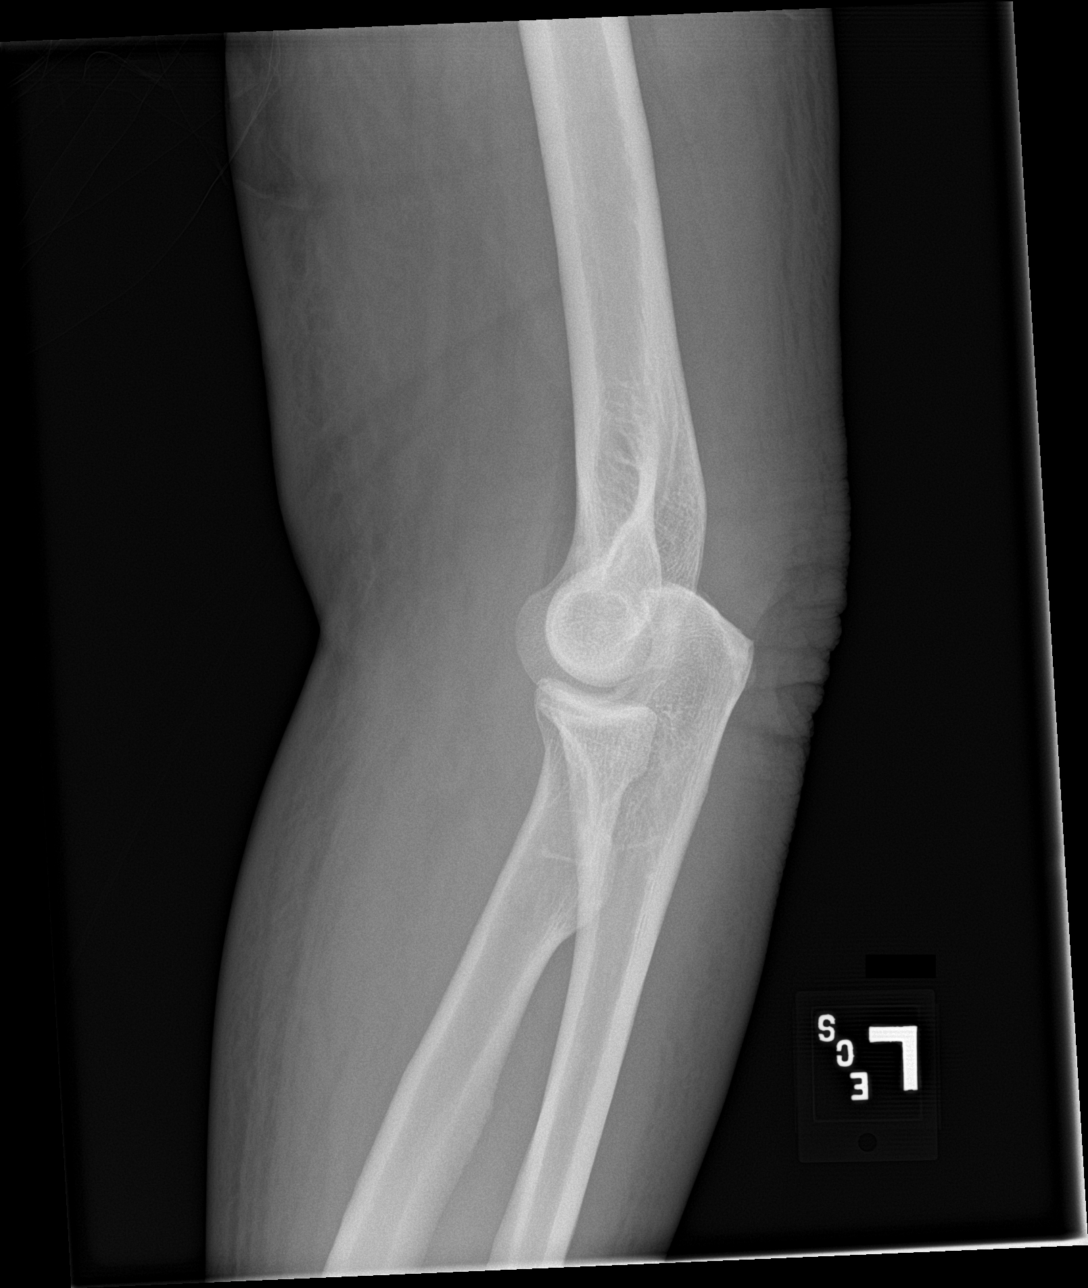

[elbow obl (2 of 2)]
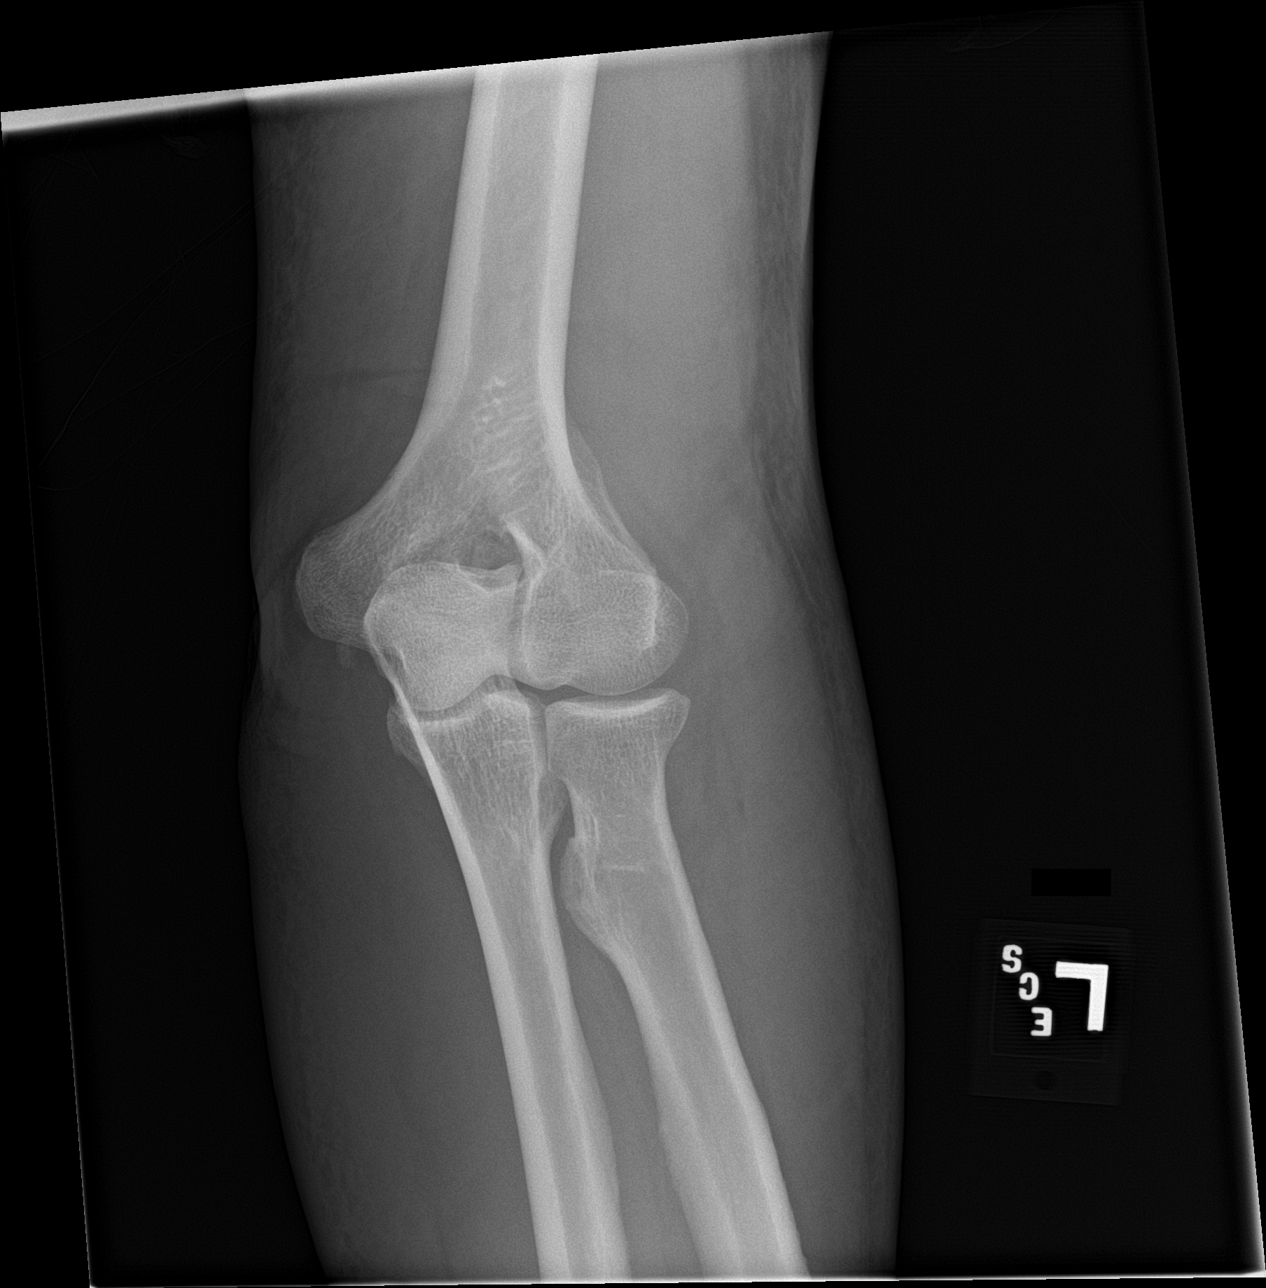

[elbow lat]
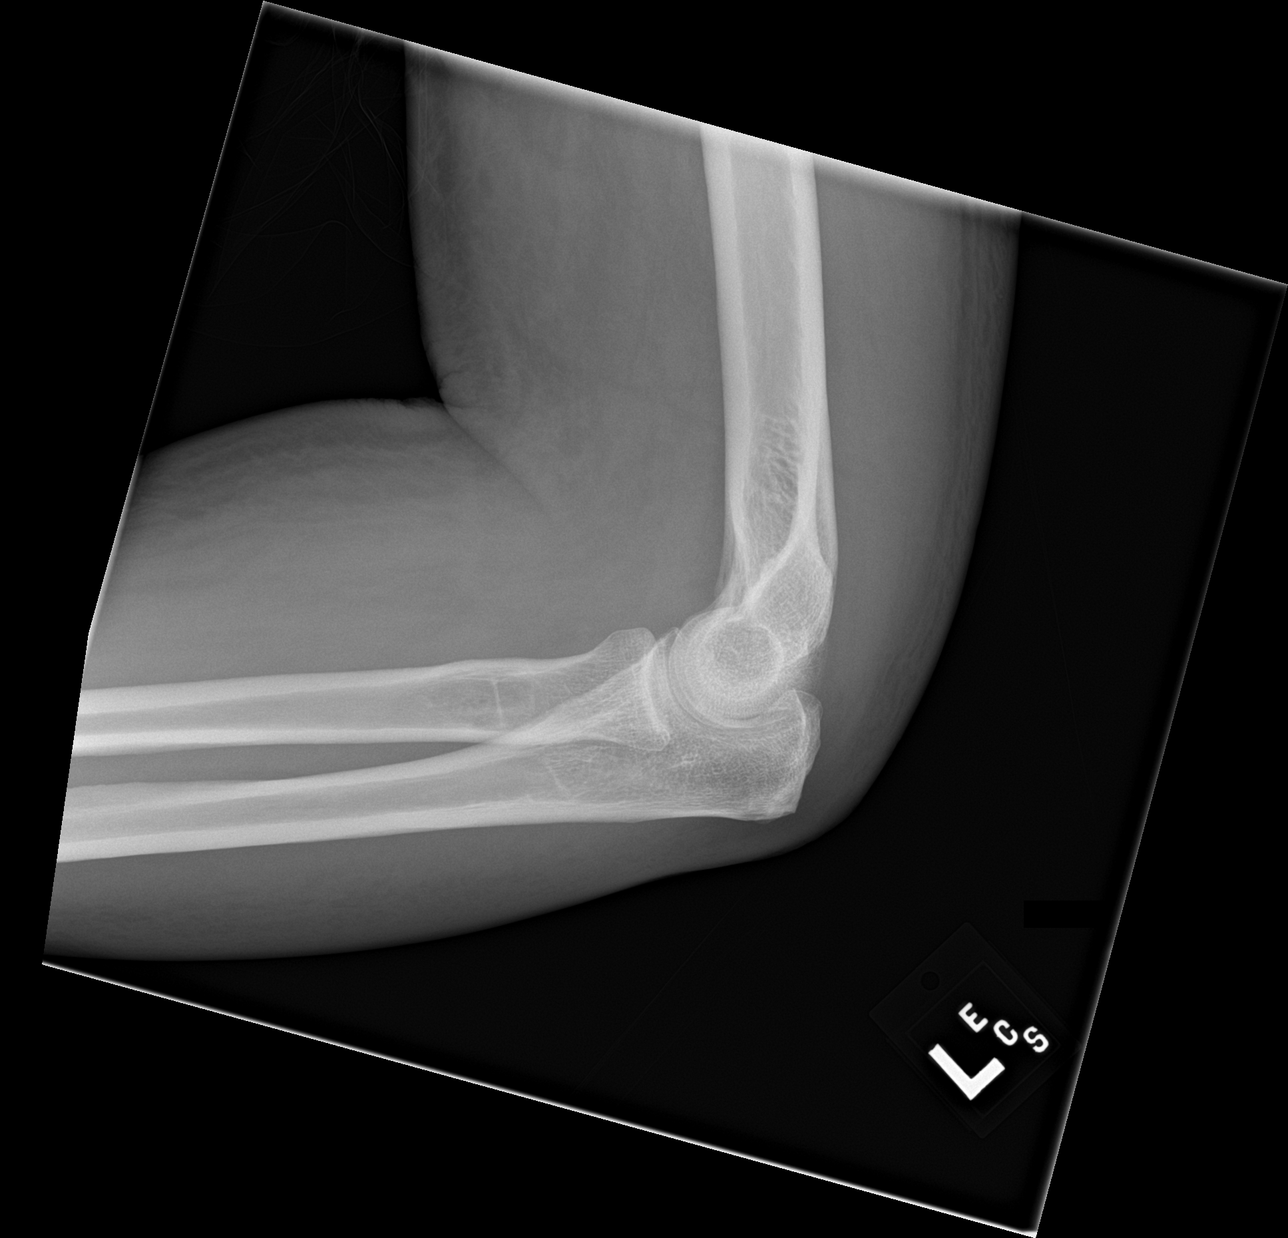

[4 of 4 positions shown; findings below may reference images not displayed]

FINDINGS: There is no evidence of fracture, dislocation, or joint effusion.
There is no evidence of arthropathy or other focal bone abnormality.
Soft tissues are unremarkable.
IMPRESSION: Negative.

## 2021-03-28 LAB — HM DIABETES EYE EXAM

## 2021-03-31 ENCOUNTER — Other Ambulatory Visit: Payer: Self-pay

## 2021-04-03 ENCOUNTER — Other Ambulatory Visit: Payer: Self-pay

## 2021-04-20 ENCOUNTER — Ambulatory Visit: Payer: Medicare Other | Attending: Family Medicine | Admitting: Family Medicine

## 2021-04-20 ENCOUNTER — Other Ambulatory Visit: Payer: Self-pay

## 2021-04-20 ENCOUNTER — Encounter: Payer: Self-pay | Admitting: Family Medicine

## 2021-04-20 VITALS — BP 133/76 | HR 82 | Ht 69.0 in | Wt 191.4 lb

## 2021-04-20 DIAGNOSIS — E1149 Type 2 diabetes mellitus with other diabetic neurological complication: Secondary | ICD-10-CM | POA: Diagnosis not present

## 2021-04-20 DIAGNOSIS — E1159 Type 2 diabetes mellitus with other circulatory complications: Secondary | ICD-10-CM

## 2021-04-20 DIAGNOSIS — E1169 Type 2 diabetes mellitus with other specified complication: Secondary | ICD-10-CM | POA: Diagnosis not present

## 2021-04-20 DIAGNOSIS — I152 Hypertension secondary to endocrine disorders: Secondary | ICD-10-CM

## 2021-04-20 DIAGNOSIS — Z794 Long term (current) use of insulin: Secondary | ICD-10-CM | POA: Diagnosis not present

## 2021-04-20 DIAGNOSIS — B351 Tinea unguium: Secondary | ICD-10-CM

## 2021-04-20 DIAGNOSIS — Z1211 Encounter for screening for malignant neoplasm of colon: Secondary | ICD-10-CM

## 2021-04-20 DIAGNOSIS — R42 Dizziness and giddiness: Secondary | ICD-10-CM

## 2021-04-20 LAB — POCT GLYCOSYLATED HEMOGLOBIN (HGB A1C): HbA1c, POC (controlled diabetic range): 5.7 % (ref 0.0–7.0)

## 2021-04-20 LAB — GLUCOSE, POCT (MANUAL RESULT ENTRY): POC Glucose: 128 mg/dl — AB (ref 70–99)

## 2021-04-20 NOTE — Patient Instructions (Signed)

## 2021-04-20 NOTE — Progress Notes (Signed)
Subjective:  Patient ID: Frederick Moody, male    DOB: 12/06/1955  Age: 65 y.o. MRN: [id number]  CC: Diabetes   HPI Frederick Moody is a 65 y.o. year old male with a history of type 2 diabetes mellitus (A1c 5.7), hypertension here for a follow-up visit. His daughter accompanies him today.  Interval History: BP was initially elevated today but repeat came back normal at 133/76. He has had normal blood pressures previously. States he had a cup of coffee prior to coming here. He has no chest pain or dyspnea.  He has numbness in his feet which is stable.Denies presence of hypoglycemia Blood sugars range from 74- 160 He poked his right big toenail because he thought something had gotten inside his nail and that resulted in some bleeding.  He has no pain, no swelling. Declines Colonoscopy. Past Medical History:  Diagnosis Date   Diabetes mellitus without complication ([place])    ETOH abuse     Past Surgical History:  Procedure Laterality Date   APPENDECTOMY     IR THORACENTESIS ASP PLEURAL SPACE W/IMG GUIDE  02/06/2019   IRRIGATION AND DEBRIDEMENT ABSCESS N/A 01/13/2019   Procedure: IRRIGATION AND DEBRIDEMENT OF BACK ABSCESS;  Surgeon: Donnie Mesa, MD;  Location: [facility];  Service: General;  Laterality: N/A;    No family history on file.  No Known Allergies  Outpatient Medications Prior to Visit  Medication Sig Dispense Refill   Accu-Chek Softclix Lancets lancets Use as instructed three times daily before meals DX E11.49 100 each 12   amLODipine (NORVASC) 10 MG tablet Take 1 tablet (10 mg total) by mouth daily. 30 tablet 6   atorvastatin (LIPITOR) 40 MG tablet Take 1 tablet (40 mg total) by mouth daily. 30 tablet 6   blood glucose meter kit and supplies Please check your blood sugars twice daily. Once in the morning and once before bed. 1 each 0   Blood Glucose Monitoring Suppl (ACCU-CHEK GUIDE ME) w/Device KIT Use three times daily before meals DX E11.49 1 kit 0   Blood  Glucose Monitoring Suppl (ACCU-CHEK GUIDE) w/Device KIT USE THREE TIMES DAILY BEFORE MEALS 1 kit 0   carvedilol (COREG) 3.125 MG tablet Take 1 tablet (3.125 mg total) by mouth 2 (two) times daily with a meal. 60 tablet 6   ergocalciferol (DRISDOL) 1.25 MG (50000 UT) capsule Take 1 capsule (50,000 Units total) by mouth once a week. 9 capsule 1   furosemide (LASIX) 20 MG tablet TAKE 1 TABLET (20 MG TOTAL) BY MOUTH DAILY. 30 tablet 6   gabapentin (NEURONTIN) 300 MG capsule TAKE 2 CAPSULES (600 MG TOTAL) BY MOUTH AT BEDTIME. 60 capsule 6   glucose blood (ACCU-CHEK GUIDE) test strip Use as instructed three times daily before meals DX E11.49 100 each 12   insulin degludec (TRESIBA FLEXTOUCH) 100 UNIT/ML FlexTouch Pen INJECT 18 UNITS INTO THE SKIN DAILY. 9 mL 2   Insulin Pen Needle (BD PEN NEEDLE NANO U/F) 32G X 4 MM MISC Use as directed. 100 each 0   lisinopril (ZESTRIL) 40 MG tablet TAKE 1 TABLET (40 MG TOTAL) BY MOUTH DAILY. 30 tablet 6   meclizine (ANTIVERT) 25 MG tablet TAKE 1 TABLET (25 MG TOTAL) BY MOUTH 3 (THREE) TIMES DAILY AS NEEDED FOR DIZZINESS. 30 tablet 6   metFORMIN (GLUCOPHAGE) 500 MG tablet TAKE 2 TABLETS (1,000 MG TOTAL) BY MOUTH 2 (TWO) TIMES DAILY WITH A MEAL. 120 tablet 6   No facility-administered medications prior to visit.  ROS Review of Systems  Constitutional:  Negative for activity change and appetite change.  HENT:  Negative for sinus pressure and sore throat.   Eyes:  Negative for visual disturbance.  Respiratory:  Negative for cough, chest tightness and shortness of breath.   Cardiovascular:  Negative for chest pain and leg swelling.  Gastrointestinal:  Negative for abdominal distention, abdominal pain, constipation and diarrhea.  Endocrine: Negative.   Genitourinary:  Negative for dysuria.  Musculoskeletal:  Negative for joint swelling and myalgias.  Skin:  Negative for rash.  Allergic/Immunologic: Negative.   Neurological:  Positive for numbness. Negative for  weakness and light-headedness.  Psychiatric/Behavioral:  Negative for dysphoric mood and suicidal ideas.    Objective:  BP 133/76   Pulse 82   Ht _0  (1.753 m)   Wt 191 lb 6.4 oz (86.8 kg)   SpO2 100%   BMI 28.26 kg/m   BP/Weight 04/20/2021 01/02/2021 41/12/4079  Systolic BP 448 185 631  Diastolic BP 76 64 82  Wt. (Lbs) 191.4 198 189  BMI 28.26 29.24 28.74      Physical Exam Constitutional:      Appearance: He is well-developed.  Cardiovascular:     Rate and Rhythm: Normal rate.     Heart sounds: Normal heart sounds. No murmur heard. Pulmonary:     Effort: Pulmonary effort is normal.     Breath sounds: Normal breath sounds. No wheezing or rales.  Chest:     Chest wall: No tenderness.  Abdominal:     General: Bowel sounds are normal. There is no distension.     Palpations: Abdomen is soft. There is no mass.     Tenderness: There is no abdominal tenderness.  Musculoskeletal:        General: Normal range of motion.     Right lower leg: No edema.     Left lower leg: No edema.  Neurological:     Mental Status: He is alert and oriented to person, place, and time.  Psychiatric:        Mood and Affect: Mood normal.   Diabetic Foot Exam - Simple   Simple Foot Form Diabetic Foot exam was performed with the following findings: Yes 04/20/2021 11:15 AM  Visual Inspection See comments: Yes Sensation Testing Intact to touch and monofilament testing bilaterally: Yes Pulse Check Posterior Tibialis and Dorsalis pulse intact bilaterally: Yes Comments Right big toenail onychomycosis with dystrophic nail.  Dried blood on medial aspect of big toe.  No ulcerations or skin breakdown     CMP Latest Ref Rng & Units 05/26/2020 11/10/2019 03/10/2019  Glucose 65 - 99 mg/dL 104(H) 78 139(H)  BUN 8 - 27 mg/dL _1 Creatinine 0.76 - 1.27 mg/dL 0.85 0.74(L) 0.89  Sodium 134 - 144 mmol/L 138 133(L) 138  Potassium 3.5 - 5.2 mmol/L 4.9 5.1 4.5  Chloride 96 - 106 mmol/L 99 95(L) 98   CO2 20 - 29 mmol/L _2 Calcium 8.6 - 10.2 mg/dL 9.8 9.9 9.2  Total Protein 6.0 - 8.5 g/dL 6.8 6.6 -  Total Bilirubin 0.0 - 1.2 mg/dL 0.5 0.6 -  Alkaline Phos 44 - 121 IU/L 40(L) 45(L) -  AST 0 - 40 IU/L 21 29 -  ALT 0 - 44 IU/L 17 14 -    Lipid Panel     Component Value Date/Time   CHOL 132 05/26/2020 1204   TRIG 63 05/26/2020 1204   HDL 54 05/26/2020 1204   CHOLHDL 2.4 05/26/2020  [place] 65 05/26/2020 1204    CBC    Component Value Date/Time   WBC 9.6 05/13/2019 1646   WBC 5.5 02/05/2019 0346   RBC 4.06 (L) 05/13/2019 1646   RBC 2.77 (L) 02/05/2019 0346   HGB 12.4 (L) 05/13/2019 1646   HCT 36.0 (L) 05/13/2019 1646   PLT 317 05/13/2019 1646   MCV 89 05/13/2019 1646   MCH 30.5 05/13/2019 1646   MCH 31.8 02/05/2019 0346   MCHC 34.4 05/13/2019 1646   MCHC 31.8 02/05/2019 0346   RDW 13.8 05/13/2019 1646   LYMPHSABS 3.5 (H) 05/13/2019 1646   MONOABS 0.4 02/05/2019 0346   EOSABS 0.4 05/13/2019 1646   BASOSABS 0.1 05/13/2019 1646    Lab Results  Component Value Date   HGBA1C 5.7 04/20/2021    Assessment & Plan:  1. Type 2 diabetes mellitus with other specified complication, with long-term current use of insulin (HCC) Controlled with A1c of 5.7 Continue current regimen Advise to decrease Tresiba by 2 units if hypoglycemia develops Counseled on Diabetic diet, my plate method, 091 minutes of moderate intensity exercise/week Blood sugar logs with fasting goals of 80-120 mg/dl, random of less than 180 and in the event of sugars less than 60 mg/dl or greater than 400 mg/dl encouraged to notify the clinic. Advised on the need for annual eye exams, annual foot exams, Pneumonia vaccine. - POCT glucose (manual entry) - POCT glycosylated hemoglobin (Hb A1C) - CMP14+EGFR - LP+Non-HDL Cholesterol - Microalbumin / creatinine urine ratio  2. Screening for colon cancer - Cologuard  3. Type 2 diabetes mellitus with other neurologic complication, with long-term  current use of insulin (HCC) Stable Continue Gabapentin  4. Onychomycosis With superimposed Trauma No evidence of infection Counseled on foot care - Ambulatory referral to Podiatry  5. Vertigo Advised to change positions slowly Prescribed Meclizine prn previously  6. Hypertension Associated with Diabetes Initially elevated BP but repeat is normal Continue current antihypertensives Counseled on blood pressure goal of less than 130/80, low-sodium, DASH diet, medication compliance, 150 minutes of moderate intensity exercise per week. Discussed medication compliance, adverse effects.    No orders of the defined types were placed in this encounter.   Follow-up: Return in about 6 months (around 10/19/2021) for Medical conditions.       Charlott Rakes, MD, FAAFP. [facility] and [place], [place]   04/20/2021, 9:19 PM

## 2021-04-21 ENCOUNTER — Telehealth: Payer: Self-pay

## 2021-04-21 ENCOUNTER — Other Ambulatory Visit: Payer: Self-pay

## 2021-04-21 ENCOUNTER — Other Ambulatory Visit: Payer: Self-pay | Admitting: Internal Medicine

## 2021-04-21 DIAGNOSIS — E875 Hyperkalemia: Secondary | ICD-10-CM

## 2021-04-21 LAB — CMP14+EGFR
ALT: 12 IU/L (ref 0–44)
AST: 18 IU/L (ref 0–40)
Albumin/Globulin Ratio: 1.8 (ref 1.2–2.2)
Albumin: 4.8 g/dL (ref 3.8–4.8)
Alkaline Phosphatase: 41 IU/L — ABNORMAL LOW (ref 44–121)
BUN/Creatinine Ratio: 13 (ref 10–24)
BUN: 15 mg/dL (ref 8–27)
Bilirubin Total: 0.5 mg/dL (ref 0.0–1.2)
CO2: 22 mmol/L (ref 20–29)
Calcium: 10.1 mg/dL (ref 8.6–10.2)
Chloride: 98 mmol/L (ref 96–106)
Creatinine, Ser: 1.14 mg/dL (ref 0.76–1.27)
Globulin, Total: 2.7 g/dL (ref 1.5–4.5)
Glucose: 128 mg/dL — ABNORMAL HIGH (ref 70–99)
Potassium: 5.8 mmol/L — ABNORMAL HIGH (ref 3.5–5.2)
Sodium: 134 mmol/L (ref 134–144)
Total Protein: 7.5 g/dL (ref 6.0–8.5)
eGFR: 71 mL/min/{1.73_m2} (ref >59)

## 2021-04-21 LAB — LP+NON-HDL CHOLESTEROL
Cholesterol, Total: 125 mg/dL (ref 100–199)
HDL: 59 mg/dL (ref >39)
LDL Chol Calc (NIH): 56 mg/dL (ref 0–99)
Total Non-HDL-Chol (LDL+VLDL): 66 mg/dL (ref 0–129)
Triglycerides: 41 mg/dL (ref 0–149)
VLDL Cholesterol Cal: 10 mg/dL (ref 5–40)

## 2021-04-21 LAB — MICROALBUMIN / CREATININE URINE RATIO
Creatinine, Urine: 19.8 mg/dL
Microalb/Creat Ratio: 228 mg/g creat — ABNORMAL HIGH (ref 0–29)
Microalbumin, Urine: 45.2 ug/mL

## 2021-04-21 MED ORDER — SODIUM POLYSTYRENE SULFONATE 15 GM/60ML PO SUSP
15.0000 g | Freq: Once | ORAL | 0 refills | Status: AC
  Filled 2021-04-21: qty 60, 1d supply, fill #0

## 2021-04-21 NOTE — Telephone Encounter (Signed)
-----   Message from Marcine Matar, MD sent at 04/21/2021  1:05 PM EDT ----- Let patient know that his potassium level is elevated.  He should cut back on eating potassium rich foods like bananas, oranges, orange juice.  I will send a one-time dose of a medication called Kayexalate to his pharmacy for him to take to help bring down the potassium level.  Please return to the lab on Monday for repeat potassium level check.  Liver function tests are good.  Kidney function is good.  Cholesterol level normal.

## 2021-04-21 NOTE — Telephone Encounter (Signed)
Patient name and DOB has been verified Patient was informed of lab results. Patient had no questions.   CRM CREATED.

## 2021-04-24 ENCOUNTER — Other Ambulatory Visit: Payer: Self-pay | Admitting: Family Medicine

## 2021-04-24 ENCOUNTER — Other Ambulatory Visit: Payer: Self-pay

## 2021-04-24 DIAGNOSIS — E1159 Type 2 diabetes mellitus with other circulatory complications: Secondary | ICD-10-CM

## 2021-04-24 DIAGNOSIS — I152 Hypertension secondary to endocrine disorders: Secondary | ICD-10-CM

## 2021-04-24 MED ORDER — LISINOPRIL 20 MG PO TABS
20.0000 mg | ORAL_TABLET | Freq: Every day | ORAL | 6 refills | Status: DC
  Filled 2021-04-24: qty 30, 30d supply, fill #0
  Filled 2021-06-05: qty 30, 30d supply, fill #1
  Filled 2021-07-06: qty 30, 30d supply, fill #0
  Filled 2021-08-04: qty 30, 30d supply, fill #1
  Filled 2021-09-06: qty 30, 30d supply, fill #2
  Filled 2021-10-06: qty 30, 30d supply, fill #3
  Filled 2021-11-06 (×2): qty 30, 30d supply, fill #0

## 2021-04-24 MED ORDER — CARVEDILOL 6.25 MG PO TABS
6.2500 mg | ORAL_TABLET | Freq: Two times a day (BID) | ORAL | 6 refills | Status: DC
  Filled 2021-04-24: qty 60, 30d supply, fill #0
  Filled 2021-06-05: qty 60, 30d supply, fill #1
  Filled 2021-07-06: qty 60, 30d supply, fill #0
  Filled 2021-08-04: qty 60, 30d supply, fill #1
  Filled 2021-09-06: qty 60, 30d supply, fill #2
  Filled 2021-10-06: qty 60, 30d supply, fill #3

## 2021-04-28 ENCOUNTER — Other Ambulatory Visit: Payer: Self-pay

## 2021-05-08 ENCOUNTER — Ambulatory Visit (INDEPENDENT_AMBULATORY_CARE_PROVIDER_SITE_OTHER): Payer: Medicare Other | Admitting: Podiatry

## 2021-05-08 ENCOUNTER — Other Ambulatory Visit: Payer: Self-pay

## 2021-05-08 DIAGNOSIS — B351 Tinea unguium: Secondary | ICD-10-CM | POA: Diagnosis not present

## 2021-05-08 DIAGNOSIS — M79675 Pain in left toe(s): Secondary | ICD-10-CM | POA: Diagnosis not present

## 2021-05-08 DIAGNOSIS — M79674 Pain in right toe(s): Secondary | ICD-10-CM | POA: Diagnosis not present

## 2021-05-08 NOTE — Progress Notes (Signed)
   SUBJECTIVE Patient with a history of diabetes mellitus presents to office today complaining of elongated, thickened nails that cause pain while ambulating in shoes.  Patient is unable to trim their own nails. Patient is here for further evaluation and treatment.   Past Medical History:  Diagnosis Date   Diabetes mellitus without complication (HCC)    ETOH abuse     OBJECTIVE General Patient is awake, alert, and oriented x 3 and in no acute distress. Derm Skin is dry and supple bilateral. Negative open lesions or macerations. Remaining integument unremarkable. Nails are tender, long, thickened and dystrophic with subungual debris, consistent with onychomycosis, 1-5 bilateral. No signs of infection noted. Vasc  DP and PT pedal pulses palpable bilaterally. Temperature gradient within normal limits.  Neuro Epicritic and protective threshold sensation diminished bilaterally.  Musculoskeletal Exam No symptomatic pedal deformities noted bilateral. Muscular strength within normal limits.  ASSESSMENT 1. Diabetes Mellitus w/ peripheral neuropathy 2.  Pain due to onychomycosis of toenails bilateral  PLAN OF CARE 1. Patient evaluated today. 2. Instructed to maintain good pedal hygiene and foot care. Stressed importance of controlling blood sugar.  3. Mechanical debridement of nails 1-5 bilaterally performed using a nail nipper. Filed with dremel without incident.  4. Return to clinic in 3 mos.     Felecia Shelling, DPM [facility]  Dr. Felecia Shelling, DPM    [address] [place], [place] [address]                Office [phone]  Fax [phone]

## 2021-06-01 LAB — COLOGUARD

## 2021-06-05 ENCOUNTER — Other Ambulatory Visit: Payer: Self-pay

## 2021-07-06 ENCOUNTER — Telehealth: Payer: Self-pay

## 2021-07-06 ENCOUNTER — Other Ambulatory Visit: Payer: Self-pay

## 2021-07-06 LAB — COLOGUARD

## 2021-07-06 NOTE — Telephone Encounter (Signed)
-----   Message from Marcine Matar, MD sent at 07/06/2021  7:51 AM EST ----- Let patient know that his stool sample for the Cologuard specimen was unable to be processed.  They will be contacting him to initiate a new sample collection.

## 2021-07-06 NOTE — Telephone Encounter (Signed)
Pt was called and message was left with sister to return phone call.

## 2021-07-12 ENCOUNTER — Other Ambulatory Visit: Payer: Self-pay

## 2021-08-04 ENCOUNTER — Other Ambulatory Visit: Payer: Self-pay

## 2021-08-04 ENCOUNTER — Other Ambulatory Visit: Payer: Self-pay | Admitting: Family Medicine

## 2021-08-04 DIAGNOSIS — Z794 Long term (current) use of insulin: Secondary | ICD-10-CM

## 2021-08-04 DIAGNOSIS — E1169 Type 2 diabetes mellitus with other specified complication: Secondary | ICD-10-CM

## 2021-08-04 DIAGNOSIS — E1149 Type 2 diabetes mellitus with other diabetic neurological complication: Secondary | ICD-10-CM

## 2021-08-04 MED ORDER — TRESIBA FLEXTOUCH 100 UNIT/ML ~~LOC~~ SOPN
18.0000 [IU] | PEN_INJECTOR | Freq: Every day | SUBCUTANEOUS | 2 refills | Status: DC
  Filled 2021-08-04 – 2021-09-06 (×2): qty 6, 33d supply, fill #0
  Filled 2021-10-06: qty 6, 33d supply, fill #1

## 2021-08-07 ENCOUNTER — Other Ambulatory Visit: Payer: Self-pay

## 2021-08-07 MED ORDER — GABAPENTIN 300 MG PO CAPS
ORAL_CAPSULE | ORAL | 6 refills | Status: DC
  Filled 2021-08-07: qty 60, 30d supply, fill #0
  Filled 2021-09-06: qty 60, 30d supply, fill #1
  Filled 2021-10-06: qty 60, 30d supply, fill #2

## 2021-08-09 ENCOUNTER — Encounter: Payer: Self-pay | Admitting: Podiatry

## 2021-08-09 ENCOUNTER — Other Ambulatory Visit: Payer: Self-pay

## 2021-08-09 ENCOUNTER — Ambulatory Visit (INDEPENDENT_AMBULATORY_CARE_PROVIDER_SITE_OTHER): Payer: Medicare Other | Admitting: Podiatry

## 2021-08-09 DIAGNOSIS — B351 Tinea unguium: Secondary | ICD-10-CM | POA: Diagnosis not present

## 2021-08-09 DIAGNOSIS — M79675 Pain in left toe(s): Secondary | ICD-10-CM

## 2021-08-09 DIAGNOSIS — M79674 Pain in right toe(s): Secondary | ICD-10-CM

## 2021-08-09 NOTE — Progress Notes (Signed)
This patient presents to the office with chief complaint of long thick painful nails.  Patient says the nails are painful walking and wearing shoes.  This patient is unable to self treat.  This patient is unable to trim his nails since she is unable to reach his nails.  She presents to the office for preventative foot care services.  General Appearance  Alert, conversant and in no acute stress.  Vascular  Dorsalis pedis and posterior tibial  pulses are palpable  bilaterally.  Capillary return is within normal limits  bilaterally. Temperature is within normal limits  bilaterally.  Neurologic  Senn-Weinstein monofilament wire test within normal limits  bilaterally. Muscle power within normal limits bilaterally.  Nails Thick disfigured discolored nails with subungual debris  from hallux to fifth toes bilaterally. No evidence of bacterial infection or drainage bilaterally.  Orthopedic  No limitations of motion  feet .  No crepitus or effusions noted.  No bony pathology or digital deformities noted.  DJD 1st MPJ right foot.  Skin  normotropic skin with no porokeratosis noted bilaterally.  No signs of infections or ulcers noted.     Onychomycosis  Nails  B/L.  Pain in right toes  Pain in left toes  Debridement of nails both feet followed trimming the nails with dremel tool.    RTC 3 months.   Calleen Alvis DPM   

## 2021-08-16 ENCOUNTER — Other Ambulatory Visit: Payer: Self-pay | Admitting: Family Medicine

## 2021-08-16 NOTE — Telephone Encounter (Signed)
Requested Prescriptions  Pending Prescriptions Disp Refills   glucose blood (ACCU-CHEK GUIDE) test strip [Pharmacy Med Name: ACCU-CHEK GUIDE TEST STRIP] 100 strip 6    Sig: USE AS INSTRUCTED THREE TIMES DAILY BEFORE MEALS     Endocrinology: Diabetes - Testing Supplies Passed - 08/16/2021  1:46 AM      Passed - Valid encounter within last 12 months    Recent Outpatient Visits          3 months ago Type 2 diabetes mellitus with other specified complication, with long-term current use of insulin (HCC)   [facility] [place], [place], MD   7 months ago Type 2 diabetes mellitus with other specified complication, with long-term current use of insulin (HCC)   [facility] [place], [place], MD   1 year ago Type 2 diabetes mellitus with other neurologic complication, with long-term current use of insulin (HCC)   [facility] [place], [place], MD   1 year ago Type 2 diabetes mellitus with other neurologic complication, with long-term current use of insulin (HCC)   [facility] [place], Odette [place], MD   1 year ago Dizziness   [facility] Hoy Register, MD      Future Appointments            In 2 months Hoy Register, MD [facility]

## 2021-09-06 ENCOUNTER — Other Ambulatory Visit: Payer: Self-pay

## 2021-09-06 ENCOUNTER — Other Ambulatory Visit: Payer: Self-pay | Admitting: Family Medicine

## 2021-09-06 DIAGNOSIS — E1159 Type 2 diabetes mellitus with other circulatory complications: Secondary | ICD-10-CM

## 2021-09-06 DIAGNOSIS — E1149 Type 2 diabetes mellitus with other diabetic neurological complication: Secondary | ICD-10-CM

## 2021-09-06 DIAGNOSIS — E1169 Type 2 diabetes mellitus with other specified complication: Secondary | ICD-10-CM

## 2021-09-06 DIAGNOSIS — R42 Dizziness and giddiness: Secondary | ICD-10-CM

## 2021-09-06 MED ORDER — FUROSEMIDE 20 MG PO TABS
ORAL_TABLET | Freq: Every day | ORAL | 1 refills | Status: DC
  Filled 2021-09-06: qty 30, 30d supply, fill #0
  Filled 2021-10-06: qty 30, 30d supply, fill #1

## 2021-09-06 MED ORDER — ATORVASTATIN CALCIUM 40 MG PO TABS
40.0000 mg | ORAL_TABLET | Freq: Every day | ORAL | 1 refills | Status: DC
  Filled 2021-09-06: qty 30, 30d supply, fill #0
  Filled 2021-10-06: qty 30, 30d supply, fill #1

## 2021-09-06 MED ORDER — AMLODIPINE BESYLATE 10 MG PO TABS
10.0000 mg | ORAL_TABLET | Freq: Every day | ORAL | 1 refills | Status: DC
  Filled 2021-09-06: qty 30, 30d supply, fill #0
  Filled 2021-10-06: qty 30, 30d supply, fill #1

## 2021-09-06 MED ORDER — MECLIZINE HCL 25 MG PO TABS
ORAL_TABLET | Freq: Three times a day (TID) | ORAL | 1 refills | Status: DC | PRN
  Filled 2021-09-06: qty 30, 30d supply, fill #0
  Filled 2021-10-06: qty 30, 10d supply, fill #1

## 2021-09-06 MED ORDER — METFORMIN HCL 500 MG PO TABS
ORAL_TABLET | Freq: Two times a day (BID) | ORAL | 1 refills | Status: DC
  Filled 2021-09-06: qty 120, 30d supply, fill #0
  Filled 2021-10-06: qty 120, 30d supply, fill #1

## 2021-09-06 NOTE — Telephone Encounter (Signed)
Requested medication (s) are due for refill today:yes all ? ?Requested medication (s) are on the active medication list: yes   ? ?Last refill: Metformin 01/10/21 #120  6 refills   Furosemide   01/10/21  #30  6 refills   Meclizine  01/10/21 #30  6 refills ? ?Future visit scheduled yes 10/16/21 ? ?Notes to clinic: Metformin and Furosemide failed due to labs. Meclizine not delegated; please review. Thank you. ? ?Requested Prescriptions  ?Pending Prescriptions Disp Refills  ? metFORMIN (GLUCOPHAGE) 500 MG tablet 120 tablet 6  ?  Sig: TAKE 2 TABLETS (1,000 MG TOTAL) BY MOUTH 2 (TWO) TIMES DAILY WITH A MEAL.  ?  ? Endocrinology:  Diabetes - Biguanides Failed - 09/06/2021 11:09 AM  ?  ?  Failed - B12 Level in normal range and within 720 days  ?  No results found for: VITAMINB12  ?  ?  ?  Failed - CBC within normal limits and completed in the last 12 months  ?  WBC  ?Date Value Ref Range Status  ?05/13/2019 9.6 3.4 - 10.8 x10E3/uL Final  ?02/05/2019 5.5 4.0 - 10.5 K/uL Final  ? ?RBC  ?Date Value Ref Range Status  ?05/13/2019 4.06 (L) 4.14 - 5.80 x10E6/uL Final  ?02/05/2019 2.77 (L) 4.22 - 5.81 MIL/uL Final  ? ?Hemoglobin  ?Date Value Ref Range Status  ?05/13/2019 12.4 (L) 13.0 - 17.7 g/dL Final  ? ?Hematocrit  ?Date Value Ref Range Status  ?05/13/2019 36.0 (L) 37.5 - 51.0 % Final  ? ?MCHC  ?Date Value Ref Range Status  ?05/13/2019 34.4 31.5 - 35.7 g/dL Final  ?02/05/2019 31.8 30.0 - 36.0 g/dL Final  ? ?MCH  ?Date Value Ref Range Status  ?05/13/2019 30.5 26.6 - 33.0 pg Final  ?02/05/2019 31.8 26.0 - 34.0 pg Final  ? ?MCV  ?Date Value Ref Range Status  ?05/13/2019 89 79 - 97 fL Final  ? ?No results found for: PLTCOUNTKUC, LABPLAT, [place] ?RDW  ?Date Value Ref Range Status  ?05/13/2019 13.8 11.6 - 15.4 % Final  ? ?  ?  ?  Passed - Cr in normal range and within 360 days  ?  Creatinine, Ser  ?Date Value Ref Range Status  ?04/20/2021 1.14 0.76 - 1.27 mg/dL Final  ?  ?  ?  ?  Passed - HBA1C is between 0 and 7.9 and within 180 days  ?   HbA1c, POC (prediabetic range)  ?Date Value Ref Range Status  ?11/10/2019 5.0 (A) 5.7 - 6.4 % Final  ? ?HbA1c, POC (controlled diabetic range)  ?Date Value Ref Range Status  ?04/20/2021 5.7 0.0 - 7.0 % Final  ?  ?  ?  ?  Passed - eGFR in normal range and within 360 days  ?  GFR calc Af Amer  ?Date Value Ref Range Status  ?05/26/2020 106 >59 mL/min/1.73 Final  ?  Comment:  ?  **In accordance with recommendations from the NKF-ASN Task force,** ?  [facility] is in the process of updating its eGFR calculation to the ?  2021 CKD-EPI creatinine equation that estimates kidney function ?  without a race variable. ?  ? ?GFR calc non Af Amer  ?Date Value Ref Range Status  ?05/26/2020 92 >59 mL/min/1.73 Final  ? ?eGFR  ?Date Value Ref Range Status  ?04/20/2021 71 >59 mL/min/1.73 Final  ?  ?  ?  ?  Passed - Valid encounter within last 6 months  ?  Recent Outpatient Visits   ? ?      ?  4 months ago Type 2 diabetes mellitus with other specified complication, with long-term current use of insulin ([place])  ? [place], Charlane Ferretti, MD  ? 7 months ago Type 2 diabetes mellitus with other specified complication, with long-term current use of insulin ([place])  ? [place] [facility] [place], Charlane Ferretti, MD  ? 1 year ago Type 2 diabetes mellitus with other neurologic complication, with long-term current use of insulin ([place])  ? [place] [facility] [place], Charlane Ferretti, MD  ? 1 year ago Type 2 diabetes mellitus with other neurologic complication, with long-term current use of insulin ([place])  ? [place], MD  ? 2 years ago Dizziness  ? [place] Charlott Rakes, MD  ? ?  ?  ?Future Appointments   ? ?        ? In 1 month Charlott Rakes, MD [place]  ? ?  ? ?  ?  ?  ? meclizine (ANTIVERT) 25 MG tablet 30 tablet 6  ?  Sig: TAKE 1 TABLET (25 MG TOTAL) BY MOUTH 3 (THREE)  TIMES DAILY AS NEEDED FOR DIZZINESS.  ?  ? Not Delegated - Gastroenterology: Antiemetics Failed - 09/06/2021 11:09 AM  ?  ?  Failed - This refill cannot be delegated  ?  ?  Passed - Valid encounter within last 6 months  ?  Recent Outpatient Visits   ? ?      ? 4 months ago Type 2 diabetes mellitus with other specified complication, with long-term current use of insulin ([place])  ? [place], Charlane Ferretti, MD  ? 7 months ago Type 2 diabetes mellitus with other specified complication, with long-term current use of insulin ([place])  ? [place] [facility] [place], Charlane Ferretti, MD  ? 1 year ago Type 2 diabetes mellitus with other neurologic complication, with long-term current use of insulin ([place])  ? [place] [facility] [place], Charlane Ferretti, MD  ? 1 year ago Type 2 diabetes mellitus with other neurologic complication, with long-term current use of insulin ([place])  ? [place], MD  ? 2 years ago Dizziness  ? [place] Charlott Rakes, MD  ? ?  ?  ?Future Appointments   ? ?        ? In 1 month Charlott Rakes, MD [place]  ? ?  ? ?  ?  ?  ? furosemide (LASIX) 20 MG tablet 30 tablet 6  ?  Sig: TAKE 1 TABLET (20 MG TOTAL) BY MOUTH DAILY.  ?  ? Cardiovascular:  Diuretics - Loop Failed - 09/06/2021 11:09 AM  ?  ?  Failed - K in normal range and within 180 days  ?  Potassium  ?Date Value Ref Range Status  ?04/20/2021 5.8 (H) 3.5 - 5.2 mmol/L Final  ?  ?  ?  ?  Failed - Mg Level in normal range and within 180 days  ?  Magnesium  ?Date Value Ref Range Status  ?02/08/2019 1.6 (L) 1.7 - 2.4 mg/dL Final  ?  Comment:  ?  Performed at [facility] [address] [place], [place] [address]  ?  ?  ?  ?  Passed - Ca in normal range and within 180 days  ?  Calcium  ?Date Value Ref Range Status  ?04/20/2021 10.1 8.6 - 10.2 mg/dL Final  ? ?Calcium, Ion  ?Date Value  Ref Range Status  ?01/20/2019 1.24 1.15 - 1.40 mmol/L Final  ?  ?  ?  ?  Passed - Na in normal range and within 180 days  ?  Sodium  ?Date Value Ref Range Status  ?04/20/2021 134 134 - 144 mmol/L Final  ?  ?  ?  ?  Passed - Cr in normal range and within 180 days  ?  Creatinine, Ser  ?Date Value Ref Range Status  ?04/20/2021 1.14 0.76 - 1.27 mg/dL Final  ?  ?  ?  ?  Passed - Cl in normal range and within 180 days  ?  Chloride  ?Date Value Ref Range Status  ?04/20/2021 98 96 - 106 mmol/L Final  ?  ?  ?  ?  Passed - Last BP in normal range  ?  BP Readings from Last 1 Encounters:  ?04/20/21 133/76  ?  ?  ?  ?  Passed - Valid encounter within last 6 months  ?  Recent Outpatient Visits   ? ?      ? 4 months ago Type 2 diabetes mellitus with other specified complication, with long-term current use of insulin ([place])  ? [place], Charlane Ferretti, MD  ? 7 months ago Type 2 diabetes mellitus with other specified complication, with long-term current use of insulin ([place])  ? [place] [facility] [place], Charlane Ferretti, MD  ? 1 year ago Type 2 diabetes mellitus with other neurologic complication, with long-term current use of insulin ([place])  ? [facility] [facility] [place], Charlane Ferretti, MD  ? 1 year ago Type 2 diabetes mellitus with other neurologic complication, with long-term current use of insulin ([place])  ? [place], MD  ? 2 years ago Dizziness  ? [place] Charlott Rakes, MD  ? ?  ?  ?Future Appointments   ? ?        ? In 1 month Charlott Rakes, MD [place]  ? ?  ? ?  ?  ?  ?Signed Prescriptions Disp Refills  ? amLODipine (NORVASC) 10 MG tablet 30 tablet 1  ?  Sig: Take 1 tablet (10 mg total) by mouth daily.  ?  ? Cardiovascular: Calcium Channel Blockers 2 Passed - 09/06/2021 11:09 AM  ?  ?  Passed - Last BP in normal range  ?  BP Readings from Last 1  Encounters:  ?04/20/21 133/76  ?  ?  ?  ?  Passed - Last Heart Rate in normal range  ?  Pulse Readings from Last 1 Encounters:  ?04/20/21 82  ?  ?  ?  ?  Passed - Valid encounter within last 6 months  ?  Re

## 2021-09-06 NOTE — Telephone Encounter (Signed)
Requested Prescriptions  ?Pending Prescriptions Disp Refills  ?? metFORMIN (GLUCOPHAGE) 500 MG tablet 120 tablet 6  ?  Sig: TAKE 2 TABLETS (1,000 MG TOTAL) BY MOUTH 2 (TWO) TIMES DAILY WITH A MEAL.  ?  ? Endocrinology:  Diabetes - Biguanides Failed - 09/06/2021 11:09 AM  ?  ?  Failed - B12 Level in normal range and within 720 days  ?  No results found for: VITAMINB12   ?  ?  Failed - CBC within normal limits and completed in the last 12 months  ?  WBC  ?Date Value Ref Range Status  ?05/13/2019 9.6 3.4 - 10.8 x10E3/uL Final  ?02/05/2019 5.5 4.0 - 10.5 K/uL Final  ? ?RBC  ?Date Value Ref Range Status  ?05/13/2019 4.06 (L) 4.14 - 5.80 x10E6/uL Final  ?02/05/2019 2.77 (L) 4.22 - 5.81 MIL/uL Final  ? ?Hemoglobin  ?Date Value Ref Range Status  ?05/13/2019 12.4 (L) 13.0 - 17.7 g/dL Final  ? ?Hematocrit  ?Date Value Ref Range Status  ?05/13/2019 36.0 (L) 37.5 - 51.0 % Final  ? ?MCHC  ?Date Value Ref Range Status  ?05/13/2019 34.4 31.5 - 35.7 g/dL Final  ?02/05/2019 31.8 30.0 - 36.0 g/dL Final  ? ?MCH  ?Date Value Ref Range Status  ?05/13/2019 30.5 26.6 - 33.0 pg Final  ?02/05/2019 31.8 26.0 - 34.0 pg Final  ? ?MCV  ?Date Value Ref Range Status  ?05/13/2019 89 79 - 97 fL Final  ? ?No results found for: PLTCOUNTKUC, LABPLAT, [place] ?RDW  ?Date Value Ref Range Status  ?05/13/2019 13.8 11.6 - 15.4 % Final  ? ?  ?  ?  Passed - Cr in normal range and within 360 days  ?  Creatinine, Ser  ?Date Value Ref Range Status  ?04/20/2021 1.14 0.76 - 1.27 mg/dL Final  ?   ?  ?  Passed - HBA1C is between 0 and 7.9 and within 180 days  ?  HbA1c, POC (prediabetic range)  ?Date Value Ref Range Status  ?11/10/2019 5.0 (A) 5.7 - 6.4 % Final  ? ?HbA1c, POC (controlled diabetic range)  ?Date Value Ref Range Status  ?04/20/2021 5.7 0.0 - 7.0 % Final  ?   ?  ?  Passed - eGFR in normal range and within 360 days  ?  GFR calc Af Amer  ?Date Value Ref Range Status  ?05/26/2020 106 >59 mL/min/1.73 Final  ?  Comment:  ?  **In accordance with recommendations  from the NKF-ASN Task force,** ?  [facility] is in the process of updating its eGFR calculation to the ?  2021 CKD-EPI creatinine equation that estimates kidney function ?  without a race variable. ?  ? ?GFR calc non Af Amer  ?Date Value Ref Range Status  ?05/26/2020 92 >59 mL/min/1.73 Final  ? ?eGFR  ?Date Value Ref Range Status  ?04/20/2021 71 >59 mL/min/1.73 Final  ?   ?  ?  Passed - Valid encounter within last 6 months  ?  Recent Outpatient Visits   ?      ? 4 months ago Type 2 diabetes mellitus with other specified complication, with long-term current use of insulin ([place])  ? [place], Charlane Ferretti, MD  ? 7 months ago Type 2 diabetes mellitus with other specified complication, with long-term current use of insulin ([place])  ? [facility] [place], Charlane Ferretti, MD  ? 1 year ago Type 2 diabetes mellitus with other neurologic complication, with long-term current use  of insulin ([place])  ? [place] [facility] [place], Charlane Ferretti, MD  ? 1 year ago Type 2 diabetes mellitus with other neurologic complication, with long-term current use of insulin ([place])  ? [place], MD  ? 2 years ago Dizziness  ? [place] Charlott Rakes, MD  ?  ?  ?Future Appointments   ?        ? In 1 month Charlott Rakes, MD [place]  ?  ? ?  ?  ?  ?? amLODipine (NORVASC) 10 MG tablet 30 tablet 6  ?  Sig: Take 1 tablet (10 mg total) by mouth daily.  ?  ? Cardiovascular: Calcium Channel Blockers 2 Passed - 09/06/2021 11:09 AM  ?  ?  Passed - Last BP in normal range  ?  BP Readings from Last 1 Encounters:  ?04/20/21 133/76  ?   ?  ?  Passed - Last Heart Rate in normal range  ?  Pulse Readings from Last 1 Encounters:  ?04/20/21 82  ?   ?  ?  Passed - Valid encounter within last 6 months  ?  Recent Outpatient Visits   ?      ? 4 months ago Type 2 diabetes mellitus with other  specified complication, with long-term current use of insulin ([place])  ? [place], Charlane Ferretti, MD  ? 7 months ago Type 2 diabetes mellitus with other specified complication, with long-term current use of insulin ([place])  ? [place] [facility] And Wellness [place], Charlane Ferretti, MD  ? 1 year ago Type 2 diabetes mellitus with other neurologic complication, with long-term current use of insulin ([place])  ? [place] [facility] And Wellness [place], Charlane Ferretti, MD  ? 1 year ago Type 2 diabetes mellitus with other neurologic complication, with long-term current use of insulin ([place])  ? [place], MD  ? 2 years ago Dizziness  ? [place] Charlott Rakes, MD  ?  ?  ?Future Appointments   ?        ? In 1 month Charlott Rakes, MD [place]  ?  ? ?  ?  ?  ?? atorvastatin (LIPITOR) 40 MG tablet 30 tablet 6  ?  Sig: Take 1 tablet (40 mg total) by mouth daily.  ?  ? Cardiovascular:  Antilipid - Statins Failed - 09/06/2021 11:09 AM  ?  ?  Failed - Lipid Panel in normal range within the last 12 months  ?  Cholesterol, Total  ?Date Value Ref Range Status  ?04/20/2021 125 100 - 199 mg/dL Final  ? ?LDL Chol Calc (NIH)  ?Date Value Ref Range Status  ?04/20/2021 56 0 - 99 mg/dL Final  ? ?HDL  ?Date Value Ref Range Status  ?04/20/2021 59 >39 mg/dL Final  ? ?Triglycerides  ?Date Value Ref Range Status  ?04/20/2021 41 0 - 149 mg/dL Final  ? ?  ?  ?  Passed - Patient is not pregnant  ?  ?  Passed - Valid encounter within last 12 months  ?  Recent Outpatient Visits   ?      ? 4 months ago Type 2 diabetes mellitus with other specified complication, with long-term current use of insulin ([place])  ? [place], Enobong, MD  ? 7 months  ago Type 2 diabetes mellitus with other specified complication, with long-term current use of insulin ([place])  ? [place]  [facility] Wellness [place], Charlane Ferretti, MD  ? 1 year ago Type 2 diabetes mellitus with other neurologic complication, with long-term current use of insulin ([place])  ? [facility] And Wellness [place], Charlane Ferretti, MD  ? 1 year ago Type 2 diabetes mellitus with other neurologic complication, with long-term current use of insulin ([place])  ? [place], MD  ? 2 years ago Dizziness  ? [place] Charlott Rakes, MD  ?  ?  ?Future Appointments   ?        ? In 1 month Charlott Rakes, MD [place]  ?  ? ?  ?  ?  ?? meclizine (ANTIVERT) 25 MG tablet 30 tablet 6  ?  Sig: TAKE 1 TABLET (25 MG TOTAL) BY MOUTH 3 (THREE) TIMES DAILY AS NEEDED FOR DIZZINESS.  ?  ? Not Delegated - Gastroenterology: Antiemetics Failed - 09/06/2021 11:09 AM  ?  ?  Failed - This refill cannot be delegated  ?  ?  Passed - Valid encounter within last 6 months  ?  Recent Outpatient Visits   ?      ? 4 months ago Type 2 diabetes mellitus with other specified complication, with long-term current use of insulin ([place])  ? [place], Charlane Ferretti, MD  ? 7 months ago Type 2 diabetes mellitus with other specified complication, with long-term current use of insulin ([place])  ? [place] [facility] And Wellness [place], Charlane Ferretti, MD  ? 1 year ago Type 2 diabetes mellitus with other neurologic complication, with long-term current use of insulin ([place])  ? [place] [facility] Wellness [place], Charlane Ferretti, MD  ? 1 year ago Type 2 diabetes mellitus with other neurologic complication, with long-term current use of insulin ([place])  ? [place], MD  ? 2 years ago Dizziness  ? [place] Charlott Rakes, MD  ?  ?  ?Future Appointments   ?        ? In 1 month Charlott Rakes, MD [place]  ?  ? ?  ?   ?  ?? furosemide (LASIX) 20 MG tablet 30 tablet 6  ?  Sig: TAKE 1 TABLET (20 MG TOTAL) BY MOUTH DAILY.  ?  ? Cardiovascular:  Diuretics - Loop Failed - 09/06/2021 11:09 AM  ?  ?  Failed - K in normal range

## 2021-09-06 NOTE — Telephone Encounter (Signed)
Requested Prescriptions  ?Pending Prescriptions Disp Refills  ?? metFORMIN (GLUCOPHAGE) 500 MG tablet 120 tablet 6  ?  Sig: TAKE 2 TABLETS (1,000 MG TOTAL) BY MOUTH 2 (TWO) TIMES DAILY WITH A MEAL.  ?  ? Endocrinology:  Diabetes - Biguanides Failed - 09/06/2021 11:09 AM  ?  ?  Failed - B12 Level in normal range and within 720 days  ?  No results found for: VITAMINB12   ?  ?  Failed - CBC within normal limits and completed in the last 12 months  ?  WBC  ?Date Value Ref Range Status  ?05/13/2019 9.6 3.4 - 10.8 x10E3/uL Final  ?02/05/2019 5.5 4.0 - 10.5 K/uL Final  ? ?RBC  ?Date Value Ref Range Status  ?05/13/2019 4.06 (L) 4.14 - 5.80 x10E6/uL Final  ?02/05/2019 2.77 (L) 4.22 - 5.81 MIL/uL Final  ? ?Hemoglobin  ?Date Value Ref Range Status  ?05/13/2019 12.4 (L) 13.0 - 17.7 g/dL Final  ? ?Hematocrit  ?Date Value Ref Range Status  ?05/13/2019 36.0 (L) 37.5 - 51.0 % Final  ? ?MCHC  ?Date Value Ref Range Status  ?05/13/2019 34.4 31.5 - 35.7 g/dL Final  ?02/05/2019 31.8 30.0 - 36.0 g/dL Final  ? ?MCH  ?Date Value Ref Range Status  ?05/13/2019 30.5 26.6 - 33.0 pg Final  ?02/05/2019 31.8 26.0 - 34.0 pg Final  ? ?MCV  ?Date Value Ref Range Status  ?05/13/2019 89 79 - 97 fL Final  ? ?No results found for: PLTCOUNTKUC, LABPLAT, [place] ?RDW  ?Date Value Ref Range Status  ?05/13/2019 13.8 11.6 - 15.4 % Final  ? ?  ?  ?  Passed - Cr in normal range and within 360 days  ?  Creatinine, Ser  ?Date Value Ref Range Status  ?04/20/2021 1.14 0.76 - 1.27 mg/dL Final  ?   ?  ?  Passed - HBA1C is between 0 and 7.9 and within 180 days  ?  HbA1c, POC (prediabetic range)  ?Date Value Ref Range Status  ?11/10/2019 5.0 (A) 5.7 - 6.4 % Final  ? ?HbA1c, POC (controlled diabetic range)  ?Date Value Ref Range Status  ?04/20/2021 5.7 0.0 - 7.0 % Final  ?   ?  ?  Passed - eGFR in normal range and within 360 days  ?  GFR calc Af Amer  ?Date Value Ref Range Status  ?05/26/2020 106 >59 mL/min/1.73 Final  ?  Comment:  ?  **In accordance with recommendations  from the NKF-ASN Task force,** ?  [facility] is in the process of updating its eGFR calculation to the ?  2021 CKD-EPI creatinine equation that estimates kidney function ?  without a race variable. ?  ? ?GFR calc non Af Amer  ?Date Value Ref Range Status  ?05/26/2020 92 >59 mL/min/1.73 Final  ? ?eGFR  ?Date Value Ref Range Status  ?04/20/2021 71 >59 mL/min/1.73 Final  ?   ?  ?  Passed - Valid encounter within last 6 months  ?  Recent Outpatient Visits   ?      ? 4 months ago Type 2 diabetes mellitus with other specified complication, with long-term current use of insulin ([place])  ? [place], Charlane Ferretti, MD  ? 7 months ago Type 2 diabetes mellitus with other specified complication, with long-term current use of insulin ([place])  ? [facility] Wellness [place], Charlane Ferretti, MD  ? 1 year ago Type 2 diabetes mellitus with other neurologic complication, with long-term current use  of insulin ([place])  ? [place] [facility] Wellness [place], Charlane Ferretti, MD  ? 1 year ago Type 2 diabetes mellitus with other neurologic complication, with long-term current use of insulin ([place])  ? [place], MD  ? 2 years ago Dizziness  ? [place] Charlott Rakes, MD  ?  ?  ?Future Appointments   ?        ? In 1 month Charlott Rakes, MD [place]  ?  ? ?  ?  ?  ?? atorvastatin (LIPITOR) 40 MG tablet 30 tablet 1  ?  Sig: Take 1 tablet (40 mg total) by mouth daily.  ?  ? Cardiovascular:  Antilipid - Statins Failed - 09/06/2021 11:09 AM  ?  ?  Failed - Lipid Panel in normal range within the last 12 months  ?  Cholesterol, Total  ?Date Value Ref Range Status  ?04/20/2021 125 100 - 199 mg/dL Final  ? ?LDL Chol Calc (NIH)  ?Date Value Ref Range Status  ?04/20/2021 56 0 - 99 mg/dL Final  ? ?HDL  ?Date Value Ref Range Status  ?04/20/2021 59 >39 mg/dL Final  ? ?Triglycerides  ?Date Value Ref  Range Status  ?04/20/2021 41 0 - 149 mg/dL Final  ? ?  ?  ?  Passed - Patient is not pregnant  ?  ?  Passed - Valid encounter within last 12 months  ?  Recent Outpatient Visits   ?      ? 4 months ago Type 2 diabetes mellitus with other specified complication, with long-term current use of insulin ([place])  ? [place], Charlane Ferretti, MD  ? 7 months ago Type 2 diabetes mellitus with other specified complication, with long-term current use of insulin ([place])  ? [place] [facility] And Wellness [place], Charlane Ferretti, MD  ? 1 year ago Type 2 diabetes mellitus with other neurologic complication, with long-term current use of insulin ([place])  ?  [facility] And Wellness [place], Charlane Ferretti, MD  ? 1 year ago Type 2 diabetes mellitus with other neurologic complication, with long-term current use of insulin ([place])  ? [place], MD  ? 2 years ago Dizziness  ? [place] Charlott Rakes, MD  ?  ?  ?Future Appointments   ?        ? In 1 month Charlott Rakes, MD [place]  ?  ? ?  ?  ?  ?? meclizine (ANTIVERT) 25 MG tablet 30 tablet 6  ?  Sig: TAKE 1 TABLET (25 MG TOTAL) BY MOUTH 3 (THREE) TIMES DAILY AS NEEDED FOR DIZZINESS.  ?  ? Not Delegated - Gastroenterology: Antiemetics Failed - 09/06/2021 11:09 AM  ?  ?  Failed - This refill cannot be delegated  ?  ?  Passed - Valid encounter within last 6 months  ?  Recent Outpatient Visits   ?      ? 4 months ago Type 2 diabetes mellitus with other specified complication, with long-term current use of insulin ([place])  ? [place], Charlane Ferretti, MD  ? 7 months ago Type 2 diabetes mellitus with other specified complication, with long-term current use of insulin ([place])  ? [place], Charlane Ferretti, MD  ? 1 year ago Type 2 diabetes mellitus  with other neurologic complication,  with long-term current use of insulin ([place])  ? [place] [facility] And Wellness [place], Charlane Ferretti, MD  ? 1 year ago Type 2 diabetes mellitus with other neurologic complication, with long-term current use of insulin ([place])  ? [place], MD  ? 2 years ago Dizziness  ? [place] Charlott Rakes, MD  ?  ?  ?Future Appointments   ?        ? In 1 month Charlott Rakes, MD [place]  ?  ? ?  ?  ?  ?? furosemide (LASIX) 20 MG tablet 30 tablet 6  ?  Sig: TAKE 1 TABLET (20 MG TOTAL) BY MOUTH DAILY.  ?  ? Cardiovascular:  Diuretics - Loop Failed - 09/06/2021 11:09 AM  ?  ?  Failed - K in normal range and within 180 days  ?  Potassium  ?Date Value Ref Range Status  ?04/20/2021 5.8 (H) 3.5 - 5.2 mmol/L Final  ?   ?  ?  Failed - Mg Level in normal range and within 180 days  ?  Magnesium  ?Date Value Ref Range Status  ?02/08/2019 1.6 (L) 1.7 - 2.4 mg/dL Final  ?  Comment:  ?  Performed at [facility] [address] [place], [place] [address]  ?   ?  ?  Passed - Ca in normal range and within 180 days  ?  Calcium  ?Date Value Ref Range Status  ?04/20/2021 10.1 8.6 - 10.2 mg/dL Final  ? ?Calcium, Ion  ?Date Value Ref Range Status  ?01/20/2019 1.24 1.15 - 1.40 mmol/L Final  ?   ?  ?  Passed - Na in normal range and within 180 days  ?  Sodium  ?Date Value Ref Range Status  ?04/20/2021 134 134 - 144 mmol/L Final  ?   ?  ?  Passed - Cr in normal range and within 180 days  ?  Creatinine, Ser  ?Date Value Ref Range Status  ?04/20/2021 1.14 0.76 - 1.27 mg/dL Final  ?   ?  ?  Passed - Cl in normal range and within 180 days  ?  Chloride  ?Date Value Ref Range Status  ?04/20/2021 98 96 - 106 mmol/L Final  ?   ?  ?  Passed - Last BP in normal range  ?  BP Readings from Last 1 Encounters:  ?04/20/21 133/76  ?   ?  ?  Passed - Valid encounter within last 6 months  ?  Recent Outpatient Visits   ?      ? 4 months ago Type 2  diabetes mellitus with other specified complication, with long-term current use of insulin ([place])  ? [place], Charlane Ferretti, MD  ? 7 months ago Type 2 diabetes mellitu

## 2021-09-07 ENCOUNTER — Other Ambulatory Visit: Payer: Self-pay | Admitting: Family Medicine

## 2021-09-07 ENCOUNTER — Other Ambulatory Visit: Payer: Self-pay

## 2021-09-07 MED ORDER — BD PEN NEEDLE NANO U/F 32G X 4 MM MISC
1.0000 [IU] | Freq: Every day | 0 refills | Status: DC
  Filled 2021-09-07: qty 100, 90d supply, fill #0

## 2021-09-08 ENCOUNTER — Other Ambulatory Visit: Payer: Self-pay

## 2021-09-08 NOTE — Telephone Encounter (Signed)
?  Notes to clinic:  Pharmacy sending request for info as follows: ? ?Pharmacy comment: Alternative Requested:PLEASE SEND DIAGNOSIS CODE. ? ?  ? ?Requested Prescriptions  ?Pending Prescriptions Disp Refills  ? ACCU-CHEK GUIDE test strip [Pharmacy Med Name: ACCU-CHEK GUIDE TEST STRIP] 100 strip 6  ?  Sig: USE AS INSTRUCTED THREE TIMES DAILY BEFORE MEALS  ?  ? Endocrinology: Diabetes - Testing Supplies Passed - 09/07/2021  5:31 PM  ?  ?  Passed - Valid encounter within last 12 months  ?  Recent Outpatient Visits   ? ?      ? 4 months ago Type 2 diabetes mellitus with other specified complication, with long-term current use of insulin (HCC)  ? [place] [facility] Wellness [place], Odette Horns, MD  ? 8 months ago Type 2 diabetes mellitus with other specified complication, with long-term current use of insulin (HCC)  ? [place] [facility] And Wellness [place], Odette Horns, MD  ? 1 year ago Type 2 diabetes mellitus with other neurologic complication, with long-term current use of insulin (HCC)  ? [place] [facility] And Wellness [place], Odette Horns, MD  ? 1 year ago Type 2 diabetes mellitus with other neurologic complication, with long-term current use of insulin (HCC)  ? [facility] Well[facility] [place], Odette Horns, MD  ? 2 years ago Dizziness  ? [facility] And Wellness Hoy Register, MD  ? ?  ?  ?Future Appointments   ? ?        ? In 1 month Hoy Register, MD [facility] And Wellness  ? ?  ? ?  ?  ?  ? ? ?

## 2021-09-11 ENCOUNTER — Other Ambulatory Visit: Payer: Self-pay | Admitting: Family Medicine

## 2021-09-12 ENCOUNTER — Other Ambulatory Visit: Payer: Self-pay | Admitting: Family Medicine

## 2021-09-12 DIAGNOSIS — E1169 Type 2 diabetes mellitus with other specified complication: Secondary | ICD-10-CM

## 2021-09-12 MED ORDER — ACCU-CHEK GUIDE VI STRP: ORAL_STRIP | 0 refills | Status: DC

## 2021-09-13 NOTE — Telephone Encounter (Signed)
Copied from CRM [id number]. Topic: General - Other ?>> Sep 13, 2021 10:44 AM Gaetana Michaelis A wrote: ?Reason for CRM: The patient's daughter has called to share that the patient's prescription for glucose blood (ACCU-CHEK GUIDE) test strip [[id number]]  has been received ? ?The patient's pharmacy is requesting that the prescription be resubmitted with a diagnosis code included so that insurance can cover the cost of the prescription  ? ?Please send corrected prescription to  ?[id number] [place], [place] - [address] ?[address] [place] [address] ?Phone: [phone] Fax: [phone] ?Hours: Not open 24 hours ? ?Please contact further if needed ?

## 2021-09-15 ENCOUNTER — Telehealth: Payer: Self-pay | Admitting: Family Medicine

## 2021-09-15 DIAGNOSIS — E1169 Type 2 diabetes mellitus with other specified complication: Secondary | ICD-10-CM

## 2021-09-15 NOTE — Telephone Encounter (Signed)
Please resend the pt's ?glucose blood (ACCU-CHEK GUIDE) test strip ?as the ?E-Prescribing Status: Transmission to pharmacy failed (09/13/2021  7:50 PM EDT)   ?Message completed by Drucilla Chalet, RPH-CPP (09/13/2021 8:39 PM).   ? ?[id number] [place], [place] - [address] ? ?Pt is completely out of his strips ?

## 2021-09-15 NOTE — Telephone Encounter (Signed)
Pt sister Caryn Section is calling to check on the status of the medication refill Please ensure dx code reflects on script. ?Pts sister is requesting a calling back once this has been completed.CB- [phone] ?

## 2021-09-18 NOTE — Telephone Encounter (Signed)
Patient sister does not want to have to drive from [place] to [facility] to pick up a hard copy script for glucose blood (ACCU-CHEK GUIDE) test strip. Caller states patient has been without of test strips for a few days. As per chart "transmission failed". Caller would like a follow up call today once script has been sent again. Please ensure dx code reflects on script.  ? ? ?[id number] [place], [place] - [address] Phone:  [phone]  ?Fax:  [phone]  ?  ? ?

## 2021-09-19 MED ORDER — ACCU-CHEK GUIDE VI STRP: ORAL_STRIP | 0 refills | Status: DC

## 2021-09-19 NOTE — Telephone Encounter (Signed)
Refill resent to requested pharmacy.

## 2021-10-06 ENCOUNTER — Other Ambulatory Visit: Payer: Self-pay | Admitting: Family Medicine

## 2021-10-06 ENCOUNTER — Other Ambulatory Visit: Payer: Self-pay

## 2021-10-06 MED ORDER — BD PEN NEEDLE NANO U/F 32G X 4 MM MISC
1.0000 [IU] | Freq: Every day | 0 refills | Status: DC
  Filled 2021-10-06 – 2021-12-06 (×4): qty 100, 90d supply, fill #0

## 2021-10-09 ENCOUNTER — Other Ambulatory Visit: Payer: Self-pay

## 2021-10-16 ENCOUNTER — Encounter: Payer: Self-pay | Admitting: Family Medicine

## 2021-10-16 ENCOUNTER — Ambulatory Visit: Payer: Medicare Other | Attending: Family Medicine | Admitting: Family Medicine

## 2021-10-16 ENCOUNTER — Other Ambulatory Visit: Payer: Self-pay

## 2021-10-16 VITALS — BP 111/68 | HR 62 | Ht 69.0 in | Wt 187.0 lb

## 2021-10-16 DIAGNOSIS — Z794 Long term (current) use of insulin: Secondary | ICD-10-CM | POA: Diagnosis not present

## 2021-10-16 DIAGNOSIS — E1140 Type 2 diabetes mellitus with diabetic neuropathy, unspecified: Secondary | ICD-10-CM | POA: Insufficient documentation

## 2021-10-16 DIAGNOSIS — E1149 Type 2 diabetes mellitus with other diabetic neurological complication: Secondary | ICD-10-CM

## 2021-10-16 DIAGNOSIS — R42 Dizziness and giddiness: Secondary | ICD-10-CM

## 2021-10-16 DIAGNOSIS — Z7182 Exercise counseling: Secondary | ICD-10-CM | POA: Insufficient documentation

## 2021-10-16 DIAGNOSIS — Z1211 Encounter for screening for malignant neoplasm of colon: Secondary | ICD-10-CM

## 2021-10-16 DIAGNOSIS — Z713 Dietary counseling and surveillance: Secondary | ICD-10-CM | POA: Diagnosis not present

## 2021-10-16 DIAGNOSIS — Z7984 Long term (current) use of oral hypoglycemic drugs: Secondary | ICD-10-CM | POA: Insufficient documentation

## 2021-10-16 DIAGNOSIS — E1169 Type 2 diabetes mellitus with other specified complication: Secondary | ICD-10-CM | POA: Diagnosis present

## 2021-10-16 DIAGNOSIS — E875 Hyperkalemia: Secondary | ICD-10-CM

## 2021-10-16 DIAGNOSIS — I152 Hypertension secondary to endocrine disorders: Secondary | ICD-10-CM

## 2021-10-16 DIAGNOSIS — I10 Essential (primary) hypertension: Secondary | ICD-10-CM | POA: Insufficient documentation

## 2021-10-16 DIAGNOSIS — Z6827 Body mass index (BMI) 27.0-27.9, adult: Secondary | ICD-10-CM | POA: Diagnosis not present

## 2021-10-16 DIAGNOSIS — Z79899 Other long term (current) drug therapy: Secondary | ICD-10-CM | POA: Insufficient documentation

## 2021-10-16 DIAGNOSIS — E1159 Type 2 diabetes mellitus with other circulatory complications: Secondary | ICD-10-CM

## 2021-10-16 LAB — GLUCOSE, POCT (MANUAL RESULT ENTRY): POC Glucose: 115 mg/dl — AB (ref 70–99)

## 2021-10-16 LAB — POCT GLYCOSYLATED HEMOGLOBIN (HGB A1C): HbA1c, POC (controlled diabetic range): 5.6 % (ref 0.0–7.0)

## 2021-10-16 MED ORDER — CARVEDILOL 6.25 MG PO TABS
6.2500 mg | ORAL_TABLET | Freq: Two times a day (BID) | ORAL | 6 refills | Status: DC
  Filled 2021-10-16 – 2021-11-08 (×3): qty 60, 30d supply, fill #0
  Filled 2021-12-06 (×2): qty 60, 30d supply, fill #1
  Filled 2022-01-04: qty 60, 30d supply, fill #2
  Filled 2022-02-05: qty 60, 30d supply, fill #3
  Filled 2022-03-07 – 2022-03-08 (×2): qty 60, 30d supply, fill #4
  Filled 2022-03-08: qty 60, 30d supply, fill #0
  Filled 2022-04-05: qty 60, 30d supply, fill #1

## 2021-10-16 MED ORDER — AMLODIPINE BESYLATE 10 MG PO TABS
10.0000 mg | ORAL_TABLET | Freq: Every day | ORAL | 6 refills | Status: DC
  Filled 2021-10-16 – 2021-11-06 (×3): qty 30, 30d supply, fill #0
  Filled 2021-12-06 (×2): qty 30, 30d supply, fill #1
  Filled 2022-01-04: qty 30, 30d supply, fill #2
  Filled 2022-02-05: qty 30, 30d supply, fill #3
  Filled 2022-03-07 – 2022-03-08 (×3): qty 30, 30d supply, fill #4
  Filled 2022-04-05: qty 30, 30d supply, fill #5

## 2021-10-16 MED ORDER — ATORVASTATIN CALCIUM 40 MG PO TABS
40.0000 mg | ORAL_TABLET | Freq: Every day | ORAL | 6 refills | Status: DC
Start: 2021-10-16 — End: 2022-04-19
  Filled 2021-10-16 – 2021-11-08 (×3): qty 30, 30d supply, fill #0
  Filled 2021-12-06 (×2): qty 30, 30d supply, fill #1
  Filled 2022-01-04: qty 30, 30d supply, fill #2
  Filled 2022-02-05: qty 30, 30d supply, fill #3
  Filled 2022-03-07: qty 30, 30d supply, fill #4
  Filled 2022-03-08: qty 30, 30d supply, fill #0
  Filled 2022-03-08: qty 30, 30d supply, fill #4
  Filled 2022-04-05: qty 30, 30d supply, fill #1

## 2021-10-16 MED ORDER — METFORMIN HCL 500 MG PO TABS
ORAL_TABLET | Freq: Two times a day (BID) | ORAL | 1 refills | Status: DC
Start: 2021-10-16 — End: 2022-01-04
  Filled 2021-10-16: qty 120, fill #0
  Filled 2021-11-06 (×2): qty 120, 30d supply, fill #0
  Filled 2021-12-06 (×2): qty 120, 30d supply, fill #1

## 2021-10-16 MED ORDER — ACCU-CHEK SOFTCLIX LANCETS MISC: 12 refills | Status: DC

## 2021-10-16 MED ORDER — MECLIZINE HCL 25 MG PO TABS
ORAL_TABLET | Freq: Three times a day (TID) | ORAL | 1 refills | Status: DC | PRN
  Filled 2021-10-16: qty 30, fill #0
  Filled 2021-11-06 (×2): qty 30, 10d supply, fill #0
  Filled 2021-12-06 (×3): qty 30, 10d supply, fill #1

## 2021-10-16 MED ORDER — TRESIBA FLEXTOUCH 100 UNIT/ML ~~LOC~~ SOPN
15.0000 [IU] | PEN_INJECTOR | Freq: Every day | SUBCUTANEOUS | 2 refills | Status: DC
  Filled 2021-10-16 – 2021-11-06 (×3): qty 6, 40d supply, fill #0
  Filled 2022-02-05: qty 6, 40d supply, fill #1
  Filled 2022-03-07 – 2022-03-08 (×3): qty 6, 40d supply, fill #2

## 2021-10-16 MED ORDER — GABAPENTIN 300 MG PO CAPS
600.0000 mg | ORAL_CAPSULE | Freq: Two times a day (BID) | ORAL | 6 refills | Status: DC
  Filled 2021-10-16 – 2021-11-06 (×3): qty 120, 30d supply, fill #0
  Filled 2021-12-06 (×2): qty 120, 30d supply, fill #1
  Filled 2022-01-04: qty 120, 30d supply, fill #2
  Filled 2022-02-05: qty 120, 30d supply, fill #3
  Filled 2022-03-07 – 2022-03-08 (×3): qty 120, 30d supply, fill #4
  Filled 2022-04-05: qty 120, 30d supply, fill #5

## 2021-10-16 MED ORDER — ACCU-CHEK GUIDE VI STRP: ORAL_STRIP | 12 refills | Status: DC

## 2021-10-16 NOTE — Progress Notes (Signed)
? ?Subjective:  ?Patient ID: Frederick Moody, male    DOB: May 29, 1956  Age: 66 y.o. MRN: [id number] ? ?CC: Diabetes ? ? ?HPI ?Frederick Moody is a 66 y.o. year old male with a history of type 2 diabetes mellitus (A1c 5.6), hypertension here for a follow-up visit. ?His daughter accompanies him today. ? ?Interval History: ?This past Sunday he had a blood sugar of 62 after he had gone without eating but blood sugars are usually normal on his current regimen. ?He has Gabapentin for his neuropathy but continues to experience numbnes in his hands and feet. ?6 months ago he had a podiatry visit. ? ?Doing well on his antihypertensive. ?.His major form of exercise is walking around the house as he is unable to walk outside due to the fact that [place] road.  He is looking into joining a gym. ?Past Medical History:  ?Diagnosis Date  ? Diabetes mellitus without complication ([place])   ? ETOH abuse   ? ? ?Past Surgical History:  ?Procedure Laterality Date  ? APPENDECTOMY    ? IR THORACENTESIS ASP PLEURAL SPACE W/IMG GUIDE  02/06/2019  ? IRRIGATION AND DEBRIDEMENT ABSCESS N/A 01/13/2019  ? Procedure: IRRIGATION AND DEBRIDEMENT OF BACK ABSCESS;  Surgeon: Donnie Mesa, MD;  Location: [facility];  Service: General;  Laterality: N/A;  ? ? ?History reviewed. No pertinent family history. ? ?Social History  ? ?Socioeconomic History  ? Marital status: Single  ?  Spouse name: Not on file  ? Number of children: Not on file  ? Years of education: Not on file  ? Highest education level: Not on file  ?Occupational History  ? Not on file  ?Tobacco Use  ? Smoking status: Never  ? Smokeless tobacco: Never  ?Substance and Sexual Activity  ? Alcohol use: Not Currently  ? Drug use: Never  ? Sexual activity: Not on file  ?Other Topics Concern  ? Not on file  ?Social History Narrative  ? Not on file  ? ?Social Determinants of Health  ? ?Financial Resource Strain: Low Risk   ? Difficulty of Paying Living Expenses: Not very hard  ?Food  Insecurity: No Food Insecurity  ? Worried About Charity fundraiser in the Last Year: Never true  ? Ran Out of Food in the Last Year: Never true  ?Transportation Needs: No Transportation Needs  ? Lack of Transportation (Medical): No  ? Lack of Transportation (Non-Medical): No  ?Physical Activity: Inactive  ? Days of Exercise per Week: 0 days  ? Minutes of Exercise per Session: 0 min  ?Stress: No Stress Concern Present  ? Feeling of Stress : Not at all  ?Social Connections: Moderately Integrated  ? Frequency of Communication with Friends and Family: More than three times a week  ? Frequency of Social Gatherings with Friends and Family: Once a week  ? Attends Religious Services: More than 4 times per year  ? Active Member of Clubs or Organizations: Yes  ? Attends Archivist Meetings: More than 4 times per year  ? Marital Status: Divorced  ? ? ?No Known Allergies ? ?Outpatient Medications Prior to Visit  ?Medication Sig Dispense Refill  ? blood glucose meter kit and supplies Please check your blood sugars twice daily. Once in the morning and once before bed. 1 each 0  ? Blood Glucose Monitoring Suppl (ACCU-CHEK GUIDE ME) w/Device KIT Use three times daily before meals DX E11.49 1 kit 0  ? ergocalciferol (DRISDOL) 1.25 MG (50000 UT)  capsule Take 1 capsule (50,000 Units total) by mouth once a week. 9 capsule 1  ? Insulin Pen Needle (BD PEN NEEDLE NANO U/F) 32G X 4 MM MISC Use as directed. 100 each 0  ? lisinopril (ZESTRIL) 20 MG tablet Take 1 tablet (20 mg total) by mouth daily. 30 tablet 6  ? Accu-Chek Softclix Lancets lancets Use as instructed three times daily before meals DX E11.49 100 each 12  ? amLODipine (NORVASC) 10 MG tablet Take 1 tablet (10 mg total) by mouth daily. 30 tablet 1  ? atorvastatin (LIPITOR) 40 MG tablet Take 1 tablet (40 mg total) by mouth daily. 30 tablet 1  ? carvedilol (COREG) 6.25 MG tablet Take 1 tablet (6.25 mg total) by mouth 2 (two) times daily with a meal. 60 tablet 6  ?  furosemide (LASIX) 20 MG tablet TAKE 1 TABLET (20 MG TOTAL) BY MOUTH DAILY. 30 tablet 1  ? gabapentin (NEURONTIN) 300 MG capsule TAKE 2 CAPSULES (600 MG TOTAL) BY MOUTH AT BEDTIME. 60 capsule 6  ? glucose blood (ACCU-CHEK GUIDE) test strip Use to check blood sugar three times daily. Dx E11.69 100 strip 0  ? insulin degludec (TRESIBA FLEXTOUCH) 100 UNIT/ML FlexTouch Pen Inject 18 Units into the skin daily. 6 mL 2  ? meclizine (ANTIVERT) 25 MG tablet TAKE 1 TABLET (25 MG TOTAL) BY MOUTH 3 (THREE) TIMES DAILY AS NEEDED FOR DIZZINESS. 30 tablet 1  ? metFORMIN (GLUCOPHAGE) 500 MG tablet TAKE 2 TABLETS (1,000 MG TOTAL) BY MOUTH 2 (TWO) TIMES DAILY WITH A MEAL. 120 tablet 1  ? ?No facility-administered medications prior to visit.  ? ? ? ?ROS ?Review of Systems  ?Constitutional:  Negative for activity change and appetite change.  ?HENT:  Negative for sinus pressure and sore throat.   ?Eyes:  Negative for visual disturbance.  ?Respiratory:  Negative for cough, chest tightness and shortness of breath.   ?Cardiovascular:  Negative for chest pain and leg swelling.  ?Gastrointestinal:  Negative for abdominal distention, abdominal pain, constipation and diarrhea.  ?Endocrine: Negative.   ?Genitourinary:  Negative for dysuria.  ?Musculoskeletal:  Negative for joint swelling and myalgias.  ?Skin:  Negative for rash.  ?Allergic/Immunologic: Negative.   ?Neurological:  Negative for weakness, light-headedness and numbness.  ?Psychiatric/Behavioral:  Negative for dysphoric mood and suicidal ideas.   ? ?Objective:  ?BP 111/68   Pulse 62   Ht _0  (1.753 m)   Wt 187 lb (84.8 kg)   SpO2 98%   BMI 27.62 kg/m?  ? ? ?  10/16/2021  ? 10:52 AM 04/20/2021  ? 10:36 AM 01/02/2021  ?  2:56 PM  ?BP/Weight  ?Systolic BP 078 675 449  ?Diastolic BP 68 76 64  ?Wt. (Lbs) 187 191.4 198  ?BMI 27.62 kg/m2 28.26 kg/m2 29.24 kg/m2  ? ? ? ? ?Physical Exam ?Constitutional:   ?   Appearance: He is well-developed.  ?Cardiovascular:  ?   Rate and Rhythm:  Normal rate.  ?   Heart sounds: Normal heart sounds. No murmur heard. ?Pulmonary:  ?   Effort: Pulmonary effort is normal.  ?   Breath sounds: Normal breath sounds. No wheezing or rales.  ?Chest:  ?   Chest wall: No tenderness.  ?Abdominal:  ?   General: Bowel sounds are normal. There is no distension.  ?   Palpations: Abdomen is soft. There is no mass.  ?   Tenderness: There is no abdominal tenderness.  ?Musculoskeletal:     ?   General:  Normal range of motion.  ?   Right lower leg: No edema.  ?   Left lower leg: No edema.  ?Neurological:  ?   Mental Status: He is alert and oriented to person, place, and time.  ?Psychiatric:     ?   Mood and Affect: Mood normal.  ? ? ? ?  Latest Ref Rng & Units 04/20/2021  ? 11:27 AM 05/26/2020  ? 12:04 PM 11/10/2019  ?  3:45 PM  ?CMP  ?Glucose 70 - 99 mg/dL 128   104   78    ?BUN 8 - 27 mg/dL _0 ?Creatinine 0.76 - 1.27 mg/dL 1.14   0.85   0.74    ?Sodium 134 - 144 mmol/L 134   138   133    ?Potassium 3.5 - 5.2 mmol/L 5.8   4.9   5.1    ?Chloride 96 - 106 mmol/L 98   99   95    ?CO2 20 - 29 mmol/L _1 ?Calcium 8.6 - 10.2 mg/dL 10.1   9.8   9.9    ?Total Protein 6.0 - 8.5 g/dL 7.5   6.8   6.6    ?Total Bilirubin 0.0 - 1.2 mg/dL 0.5   0.5   0.6    ?Alkaline Phos 44 - 121 IU/L 41   40   45    ?AST 0 - 40 IU/L _2 ?ALT 0 - 44 IU/L _3 ? ? ?Lipid Panel  ?   ?Component Value Date/Time  ? CHOL 125 04/20/2021 1127  ? TRIG 41 04/20/2021 1127  ? HDL 59 04/20/2021 1127  ? CHOLHDL 2.4 05/26/2020 1204  ? LDLCALC 56 04/20/2021 1127  ? ? ?CBC ?   ?Component Value Date/Time  ? WBC 9.6 05/13/2019 1646  ? WBC 5.5 02/05/2019 0346  ? RBC 4.06 (L) 05/13/2019 1646  ? RBC 2.77 (L) 02/05/2019 0346  ? HGB 12.4 (L) 05/13/2019 1646  ? HCT 36.0 (L) 05/13/2019 1646  ? PLT 317 05/13/2019 1646  ? MCV 89 05/13/2019 1646  ? MCH 30.5 05/13/2019 1646  ? MCH 31.8 02/05/2019 0346  ? MCHC 34.4 05/13/2019 1646  ? MCHC 31.8 02/05/2019 0346  ? RDW 13.8 05/13/2019 1646  ?  LYMPHSABS 3.5 (H) 05/13/2019 1646  ? MONOABS 0.4 02/05/2019 0346  ? EOSABS 0.4 05/13/2019 1646  ? BASOSABS 0.1 05/13/2019 1646  ? ? ?Lab Results  ?Component Value Date  ? HGBA1C 5.6 10/16/2021  ? ? ?Assessment & Plan:  ?1. Type

## 2021-10-16 NOTE — Patient Instructions (Signed)
Diabetic Neuropathy ?Diabetic neuropathy refers to nerve damage that is caused by diabetes. Over time, people with diabetes can develop nerve damage throughout the body. There are several types of diabetic neuropathy: ?Peripheral neuropathy. This is the most common type of diabetic neuropathy. It damages the nerves that carry signals between the spinal cord and other parts of the body (peripheral nerves). This usually affects nerves in the feet, legs, hands, and arms. ?Autonomic neuropathy. This type causes damage to nerves that control involuntary functions (autonomic nerves). Involuntary functions are functions of the body that you do not control. They include heartbeat, body temperature, blood pressure, urination, digestion, sweating, sexual function, or response to changes in blood glucose. ?Focal neuropathy. This type of nerve damage affects one area of the body, such as an arm, a leg, or the face. The injury may involve one nerve or a small group of nerves. Focal neuropathy can be painful and unpredictable. It occurs most often in older adults with diabetes. This often develops suddenly, but usually improves over time and does not cause long-term problems. ?Proximal neuropathy. This type of nerve damage affects the nerves of the thighs, hips, buttocks, or legs. It causes severe pain, weakness, and muscle death (atrophy), usually in the thigh muscles. It is more common among older men and people who have type 2 diabetes. The length of recovery time may vary. ?What are the causes? ?Peripheral, autonomic, and focal neuropathies are caused by diabetes that is not well controlled with treatment. The cause of proximal neuropathy is not known, but it may be caused by inflammation related to uncontrolled blood glucose levels. ?What are the signs or symptoms? ?Peripheral neuropathy ?Peripheral neuropathy develops slowly over time. When the nerves of the feet and legs no longer work, you may experience: ?Burning,  stabbing, or aching pain in the legs or feet. ?Pain or cramping in the legs or feet. ?Loss of feeling (numbness) and inability to feel pressure or pain in the feet. This can lead to: ?Thick calluses or sores on areas of constant pressure. ?Ulcers. ?Reduced ability to feel temperature changes. ?Foot deformities. ?Muscle weakness. ?Loss of balance or coordination. ?Autonomic neuropathy ?The symptoms of autonomic neuropathy vary depending on which nerves are affected. Symptoms may include: ?Problems with digestion, such as: ?Nausea or vomiting. ?Poor appetite. ?Bloating. ?Diarrhea or constipation. ?Trouble swallowing. ?Losing weight without trying to. ?Problems with the heart, blood, and lungs, such as: ?Dizziness, especially when standing up. ?Fainting. ?Shortness of breath. ?Irregular heartbeat. ?Bladder problems, such as: ?Trouble starting or stopping urination. ?Leaking urine. ?Trouble emptying the bladder. ?Urinary tract infections (UTIs). ?Problems with other body functions, such as: ?Sweat. You may sweat too much or too little. ?Temperature. You might get hot easily. Or, you might feel cold more than usual. ?Sexual function. Men may not be able to get or maintain an erection. Women may have vaginal dryness and difficulty with arousal. ?Focal neuropathy ?Symptoms affect only one area of the body. Common symptoms include: ?Numbness. ?Tingling. ?Burning pain. ?Prickling feeling. ?Very sensitive skin. ?Weakness. ?Inability to move (paralysis). ?Muscle twitching. ?Muscles getting smaller (wasting). ?Poor coordination. ?Double or blurred vision. ?Proximal neuropathy ?Sudden, severe pain in the hip, thigh, or buttocks. Pain may spread from the back into the legs (sciatica). ?Pain and numbness in the arms and legs. ?Tingling. ?Loss of bladder control or bowel control. ?Weakness and wasting of thigh muscles. ?Difficulty getting up from a seated position. ?Abdominal swelling. ?Unexplained weight loss. ?How is this  diagnosed? ?Diagnosis varies depending on the type   of neuropathy your health care provider suspects. ?Peripheral neuropathy ?Your health care provider will do a neurologic exam. This exam checks your reflexes, how you move, and what you can feel. You may have other tests, such as: ?Blood tests. ?Tests of the fluid that surrounds the spinal cord (lumbar puncture). ?CT scan. ?MRI. ?Checking the nerves that control muscles (electromyogram, or EMG). ?Checking how quickly signals pass through your nerves (nerve conduction study). ?Checking a small piece of a nerve using a microscope (biopsy). ?Autonomic neuropathy ?You may have tests, such as: ?Tests to measure your blood pressure and heart rate. You may be secured to an exam table that moves you from a lying position to an upright position (table tilt test). ?Breathing tests to check your lungs. ?Tests to check how food moves through the digestive system (gastric emptying tests). ?Blood, sweat, or urine tests. ?Ultrasound of your bladder. ?Spinal fluid tests. ?Focal neuropathy ?This condition may be diagnosed with: ?A neurologic exam. ?CT scan. ?MRI. ?EMG. ?Nerve conduction study. ?Proximal neuropathy ?There is no test to diagnose this type of neuropathy. You may have tests to rule out other possible causes of this type of neuropathy. Tests may include: ?X-rays of your spine and lumbar region. ?Lumbar puncture. ?MRI. ?How is this treated? ?The goal of treatment is to keep nerve damage from getting worse. Treatment may include: ?Following your diabetes management plan. This will help keep your blood glucose level and your A1C level within your target range. This is the most important treatment. ?Using prescription pain medicine. ?Follow these instructions at home: ?Diabetes management ?Follow your diabetes management plan as told by your health care provider. ?Check your blood glucose levels. ?Keep your blood glucose in your target range. ?Have your A1C level checked at  least two times a year, or as often as told. ?Take over the counter and prescription medicines only as told by your health care provider. This includes insulin and diabetes medicine. ? ?Lifestyle ? ?Do not use any products that contain nicotine or tobacco, such as cigarettes, e-cigarettes, and chewing tobacco. If you need help quitting, ask your health care provider. ?Be physically active every day. Include strength training and balance exercises. ?Follow a healthy meal plan. ?Work with your health care provider to manage your blood pressure. ?General instructions ?Ask your health care provider if the medicine prescribed to you requires you to avoid driving or using machinery. ?Check your skin and feet every day for cuts, bruises, redness, blisters, or sores. ?Keep all follow-up visits. This is important. ?Contact a health care provider if: ?You have burning, stabbing, or aching pain in your legs or feet. ?You are unable to feel pressure or pain in your feet. ?You develop problems with digestion, such as: ?Nausea. ?Vomiting. ?Bloating. ?Constipation. ?Diarrhea. ?Abdominal pain. ?You have difficulty with urination, such as: ?Inability to control when you urinate (incontinence). ?Inability to completely empty the bladder (retention). ?You feel as if your heart is racing (palpitations). ?You feel dizzy, weak, or faint when you stand up. ?Get help right away if: ?You cannot urinate. ?You have sudden weakness or loss of coordination. ?You have trouble speaking. ?You have pain or pressure in your chest. ?You have an irregular heartbeat. ?You have sudden inability to move a part of your body. ?These symptoms may represent a serious problem that is an emergency. Do not wait to see if the symptoms will go away. Get medical help right away. Call your local emergency services (911 in the [place] Do not drive yourself to   the hospital. ?Summary ?Diabetic neuropathy is nerve damage that is caused by diabetes. It can cause numbness  and pain in the arms, legs, digestive tract, heart, and other body systems. ?This condition is treated by keeping your blood glucose level and your A1C level within your target range. This can help prevent neurop

## 2021-10-17 LAB — CMP14+EGFR
ALT: 10 IU/L (ref 0–44)
AST: 16 IU/L (ref 0–40)
Albumin/Globulin Ratio: 1.9 (ref 1.2–2.2)
Albumin: 4.5 g/dL (ref 3.8–4.8)
Alkaline Phosphatase: 44 IU/L (ref 44–121)
BUN/Creatinine Ratio: 15 (ref 10–24)
BUN: 16 mg/dL (ref 8–27)
Bilirubin Total: 0.5 mg/dL (ref 0.0–1.2)
CO2: 23 mmol/L (ref 20–29)
Calcium: 9.8 mg/dL (ref 8.6–10.2)
Chloride: 91 mmol/L — ABNORMAL LOW (ref 96–106)
Creatinine, Ser: 1.07 mg/dL (ref 0.76–1.27)
Globulin, Total: 2.4 g/dL (ref 1.5–4.5)
Glucose: 103 mg/dL — ABNORMAL HIGH (ref 70–99)
Potassium: 4.8 mmol/L (ref 3.5–5.2)
Sodium: 129 mmol/L — ABNORMAL LOW (ref 134–144)
Total Protein: 6.9 g/dL (ref 6.0–8.5)
eGFR: 77 mL/min/{1.73_m2} (ref >59)

## 2021-10-17 LAB — LP+NON-HDL CHOLESTEROL
Cholesterol, Total: 100 mg/dL (ref 100–199)
HDL: 44 mg/dL (ref >39)
LDL Chol Calc (NIH): 41 mg/dL (ref 0–99)
Total Non-HDL-Chol (LDL+VLDL): 56 mg/dL (ref 0–129)
Triglycerides: 68 mg/dL (ref 0–149)
VLDL Cholesterol Cal: 15 mg/dL (ref 5–40)

## 2021-11-03 ENCOUNTER — Other Ambulatory Visit: Payer: Self-pay | Admitting: Family Medicine

## 2021-11-06 ENCOUNTER — Other Ambulatory Visit (HOSPITAL_COMMUNITY): Payer: Self-pay

## 2021-11-06 ENCOUNTER — Other Ambulatory Visit: Payer: Self-pay | Admitting: Family Medicine

## 2021-11-06 ENCOUNTER — Other Ambulatory Visit: Payer: Self-pay

## 2021-11-06 DIAGNOSIS — E1159 Type 2 diabetes mellitus with other circulatory complications: Secondary | ICD-10-CM

## 2021-11-06 NOTE — Telephone Encounter (Signed)
Will forward to provider  

## 2021-11-08 ENCOUNTER — Encounter: Payer: Self-pay | Admitting: Podiatry

## 2021-11-08 ENCOUNTER — Other Ambulatory Visit (HOSPITAL_COMMUNITY): Payer: Self-pay

## 2021-11-08 ENCOUNTER — Ambulatory Visit (INDEPENDENT_AMBULATORY_CARE_PROVIDER_SITE_OTHER): Payer: Medicare Other | Admitting: Podiatry

## 2021-11-08 DIAGNOSIS — B351 Tinea unguium: Secondary | ICD-10-CM | POA: Diagnosis not present

## 2021-11-08 DIAGNOSIS — M79675 Pain in left toe(s): Secondary | ICD-10-CM

## 2021-11-08 DIAGNOSIS — M79674 Pain in right toe(s): Secondary | ICD-10-CM | POA: Diagnosis not present

## 2021-11-08 NOTE — Progress Notes (Signed)
This patient presents to the office with chief complaint of long thick painful nails.  Patient says the nails are painful walking and wearing shoes.  This patient is unable to self treat.  This patient is unable to trim his nails since she is unable to reach his nails.  She presents to the office for preventative foot care services.  General Appearance  Alert, conversant and in no acute stress.  Vascular  Dorsalis pedis and posterior tibial  pulses are palpable  bilaterally.  Capillary return is within normal limits  bilaterally. Temperature is within normal limits  bilaterally.  Neurologic  Senn-Weinstein monofilament wire test within normal limits  bilaterally. Muscle power within normal limits bilaterally.  Nails Thick disfigured discolored nails with subungual debris  from hallux to fifth toes bilaterally. No evidence of bacterial infection or drainage bilaterally.  Orthopedic  No limitations of motion  feet .  No crepitus or effusions noted.  No bony pathology or digital deformities noted.  DJD 1st MPJ right foot.  Skin  normotropic skin with no porokeratosis noted bilaterally.  No signs of infections or ulcers noted.     Onychomycosis  Nails  B/L.  Pain in right toes  Pain in left toes  Debridement of nails both feet followed trimming the nails with dremel tool.    RTC 3 months.   Tacie Mccuistion DPM   

## 2021-12-06 ENCOUNTER — Other Ambulatory Visit (HOSPITAL_COMMUNITY): Payer: Self-pay

## 2021-12-06 ENCOUNTER — Other Ambulatory Visit: Payer: Self-pay | Admitting: Family Medicine

## 2021-12-06 ENCOUNTER — Other Ambulatory Visit: Payer: Self-pay

## 2021-12-06 DIAGNOSIS — E1159 Type 2 diabetes mellitus with other circulatory complications: Secondary | ICD-10-CM

## 2021-12-06 MED ORDER — LISINOPRIL 20 MG PO TABS
20.0000 mg | ORAL_TABLET | Freq: Every day | ORAL | 4 refills | Status: DC
  Filled 2021-12-06: qty 30, 30d supply, fill #0
  Filled 2022-01-04: qty 30, 30d supply, fill #1
  Filled 2022-02-05: qty 30, 30d supply, fill #2
  Filled 2022-03-07 – 2022-03-08 (×2): qty 30, 30d supply, fill #3
  Filled 2022-04-05: qty 30, 30d supply, fill #4

## 2021-12-06 NOTE — Telephone Encounter (Signed)
Requested Prescriptions  Pending Prescriptions Disp Refills  . lisinopril (ZESTRIL) 20 MG tablet 30 tablet 4    Sig: Take 1 tablet (20 mg total) by mouth daily.     Cardiovascular:  ACE Inhibitors Passed - 12/06/2021 11:14 AM      Passed - Cr in normal range and within 180 days    Creatinine, Ser  Date Value Ref Range Status  10/16/2021 1.07 0.76 - 1.27 mg/dL Final         Passed - K in normal range and within 180 days    Potassium  Date Value Ref Range Status  10/16/2021 4.8 3.5 - 5.2 mmol/L Final         Passed - Patient is not pregnant      Passed - Last BP in normal range    BP Readings from Last 1 Encounters:  10/16/21 111/68         Passed - Valid encounter within last 6 months    Recent Outpatient Visits          1 month ago Type 2 diabetes mellitus with other specified complication, with long-term current use of insulin (HCC)   [facility] [place], [place], MD   7 months ago Type 2 diabetes mellitus with other specified complication, with long-term current use of insulin (HCC)   [facility] [place], [place], MD   11 months ago Type 2 diabetes mellitus with other specified complication, with long-term current use of insulin (HCC)   [facility] [place], [place], MD   1 year ago Type 2 diabetes mellitus with other neurologic complication, with long-term current use of insulin (HCC)   [facility] [place], [place], MD   2 years ago Type 2 diabetes mellitus with other neurologic complication, with long-term current use of insulin (HCC)    [facility] Hoy Register, MD      Future Appointments            In 4 months Hoy Register, MD [facility]

## 2022-01-04 ENCOUNTER — Other Ambulatory Visit: Payer: Self-pay

## 2022-01-04 ENCOUNTER — Other Ambulatory Visit: Payer: Self-pay | Admitting: Family Medicine

## 2022-01-04 DIAGNOSIS — Z794 Long term (current) use of insulin: Secondary | ICD-10-CM

## 2022-01-04 DIAGNOSIS — R42 Dizziness and giddiness: Secondary | ICD-10-CM

## 2022-01-04 MED ORDER — METFORMIN HCL 500 MG PO TABS
ORAL_TABLET | Freq: Two times a day (BID) | ORAL | 1 refills | Status: DC
  Filled 2022-01-04: qty 120, 30d supply, fill #0
  Filled 2022-02-05: qty 120, 30d supply, fill #1

## 2022-01-04 MED ORDER — MECLIZINE HCL 25 MG PO TABS
ORAL_TABLET | Freq: Three times a day (TID) | ORAL | 1 refills | Status: DC | PRN
  Filled 2022-01-04: qty 30, 10d supply, fill #0
  Filled 2022-02-05: qty 30, 10d supply, fill #1

## 2022-02-05 ENCOUNTER — Other Ambulatory Visit: Payer: Self-pay

## 2022-02-05 ENCOUNTER — Other Ambulatory Visit (HOSPITAL_COMMUNITY): Payer: Self-pay

## 2022-02-07 ENCOUNTER — Ambulatory Visit (INDEPENDENT_AMBULATORY_CARE_PROVIDER_SITE_OTHER): Payer: Medicare Other | Admitting: Podiatry

## 2022-02-07 ENCOUNTER — Encounter: Payer: Self-pay | Admitting: Podiatry

## 2022-02-07 DIAGNOSIS — B351 Tinea unguium: Secondary | ICD-10-CM | POA: Diagnosis not present

## 2022-02-07 DIAGNOSIS — M79674 Pain in right toe(s): Secondary | ICD-10-CM | POA: Diagnosis not present

## 2022-02-07 DIAGNOSIS — M79675 Pain in left toe(s): Secondary | ICD-10-CM

## 2022-02-07 NOTE — Progress Notes (Signed)
This patient presents to the office with chief complaint of long thick painful nails.  Patient says the nails are painful walking and wearing shoes.  This patient is unable to self treat.  This patient is unable to trim his nails since she is unable to reach his nails.  She presents to the office for preventative foot care services.  General Appearance  Alert, conversant and in no acute stress.  Vascular  Dorsalis pedis and posterior tibial  pulses are palpable  bilaterally.  Capillary return is within normal limits  bilaterally. Temperature is within normal limits  bilaterally.  Neurologic  Senn-Weinstein monofilament wire test within normal limits  bilaterally. Muscle power within normal limits bilaterally.  Nails Thick disfigured discolored nails with subungual debris  from hallux to fifth toes bilaterally. No evidence of bacterial infection or drainage bilaterally.  Orthopedic  No limitations of motion  feet .  No crepitus or effusions noted.  No bony pathology or digital deformities noted.  DJD 1st MPJ right foot.  Skin  normotropic skin with no porokeratosis noted bilaterally.  No signs of infections or ulcers noted.     Onychomycosis  Nails  B/L.  Pain in right toes  Pain in left toes  Debridement of nails both feet followed trimming the nails with dremel tool.    RTC 3 months.   Helane Gunther DPM

## 2022-03-07 ENCOUNTER — Other Ambulatory Visit (HOSPITAL_COMMUNITY): Payer: Self-pay

## 2022-03-07 ENCOUNTER — Other Ambulatory Visit: Payer: Self-pay | Admitting: Family Medicine

## 2022-03-07 ENCOUNTER — Other Ambulatory Visit: Payer: Self-pay

## 2022-03-07 DIAGNOSIS — R42 Dizziness and giddiness: Secondary | ICD-10-CM

## 2022-03-07 DIAGNOSIS — E1169 Type 2 diabetes mellitus with other specified complication: Secondary | ICD-10-CM

## 2022-03-07 DIAGNOSIS — E1149 Type 2 diabetes mellitus with other diabetic neurological complication: Secondary | ICD-10-CM

## 2022-03-07 MED ORDER — MECLIZINE HCL 25 MG PO TABS
25.0000 mg | ORAL_TABLET | Freq: Three times a day (TID) | ORAL | 0 refills | Status: DC | PRN
  Filled 2022-03-07: qty 30, fill #0
  Filled 2022-03-08: qty 30, 10d supply, fill #0

## 2022-03-07 MED ORDER — BD PEN NEEDLE NANO U/F 32G X 4 MM MISC
1.0000 [IU] | Freq: Every day | 0 refills | Status: DC
  Filled 2022-03-07 – 2022-03-08 (×3): qty 100, 90d supply, fill #0

## 2022-03-07 MED ORDER — METFORMIN HCL 500 MG PO TABS
1000.0000 mg | ORAL_TABLET | Freq: Two times a day (BID) | ORAL | 0 refills | Status: DC
  Filled 2022-03-07: qty 120, fill #0
  Filled 2022-03-08 (×2): qty 120, 30d supply, fill #0

## 2022-03-08 ENCOUNTER — Other Ambulatory Visit (HOSPITAL_COMMUNITY): Payer: Self-pay

## 2022-03-08 ENCOUNTER — Other Ambulatory Visit: Payer: Self-pay

## 2022-03-15 ENCOUNTER — Other Ambulatory Visit (HOSPITAL_COMMUNITY): Payer: Self-pay

## 2022-03-15 ENCOUNTER — Other Ambulatory Visit: Payer: Self-pay

## 2022-04-05 ENCOUNTER — Other Ambulatory Visit: Payer: Self-pay

## 2022-04-05 ENCOUNTER — Other Ambulatory Visit: Payer: Self-pay | Admitting: Family Medicine

## 2022-04-05 DIAGNOSIS — Z794 Long term (current) use of insulin: Secondary | ICD-10-CM

## 2022-04-05 DIAGNOSIS — R42 Dizziness and giddiness: Secondary | ICD-10-CM

## 2022-04-05 MED ORDER — TRESIBA FLEXTOUCH 100 UNIT/ML ~~LOC~~ SOPN
15.0000 [IU] | PEN_INJECTOR | Freq: Every day | SUBCUTANEOUS | 0 refills | Status: DC
  Filled 2022-04-05: qty 6, 40d supply, fill #0

## 2022-04-05 MED ORDER — MECLIZINE HCL 25 MG PO TABS
25.0000 mg | ORAL_TABLET | Freq: Three times a day (TID) | ORAL | 0 refills | Status: DC | PRN
  Filled 2022-04-05: qty 30, 10d supply, fill #0

## 2022-04-05 MED ORDER — METFORMIN HCL 500 MG PO TABS
1000.0000 mg | ORAL_TABLET | Freq: Two times a day (BID) | ORAL | 0 refills | Status: DC
  Filled 2022-04-05: qty 120, 30d supply, fill #0

## 2022-04-19 ENCOUNTER — Encounter: Payer: Self-pay | Admitting: Physician Assistant

## 2022-04-19 ENCOUNTER — Ambulatory Visit: Payer: Medicare Other | Admitting: Family Medicine

## 2022-04-19 ENCOUNTER — Other Ambulatory Visit: Payer: Self-pay

## 2022-04-19 ENCOUNTER — Ambulatory Visit: Payer: Medicare Other | Attending: Family Medicine | Admitting: Physician Assistant

## 2022-04-19 VITALS — BP 148/88 | HR 84 | Wt 181.6 lb

## 2022-04-19 DIAGNOSIS — Z79899 Other long term (current) drug therapy: Secondary | ICD-10-CM | POA: Diagnosis not present

## 2022-04-19 DIAGNOSIS — I10 Essential (primary) hypertension: Secondary | ICD-10-CM | POA: Diagnosis not present

## 2022-04-19 DIAGNOSIS — R42 Dizziness and giddiness: Secondary | ICD-10-CM | POA: Insufficient documentation

## 2022-04-19 DIAGNOSIS — Z794 Long term (current) use of insulin: Secondary | ICD-10-CM | POA: Insufficient documentation

## 2022-04-19 DIAGNOSIS — E1169 Type 2 diabetes mellitus with other specified complication: Secondary | ICD-10-CM

## 2022-04-19 DIAGNOSIS — I152 Hypertension secondary to endocrine disorders: Secondary | ICD-10-CM

## 2022-04-19 DIAGNOSIS — E1149 Type 2 diabetes mellitus with other diabetic neurological complication: Secondary | ICD-10-CM | POA: Diagnosis not present

## 2022-04-19 DIAGNOSIS — E119 Type 2 diabetes mellitus without complications: Secondary | ICD-10-CM | POA: Insufficient documentation

## 2022-04-19 DIAGNOSIS — E1159 Type 2 diabetes mellitus with other circulatory complications: Secondary | ICD-10-CM | POA: Diagnosis not present

## 2022-04-19 DIAGNOSIS — E871 Hypo-osmolality and hyponatremia: Secondary | ICD-10-CM | POA: Insufficient documentation

## 2022-04-19 DIAGNOSIS — Z7984 Long term (current) use of oral hypoglycemic drugs: Secondary | ICD-10-CM | POA: Insufficient documentation

## 2022-04-19 LAB — POCT GLYCOSYLATED HEMOGLOBIN (HGB A1C): HbA1c, POC (controlled diabetic range): 5.6 % (ref 0.0–7.0)

## 2022-04-19 LAB — GLUCOSE, POCT (MANUAL RESULT ENTRY): POC Glucose: 97 mg/dl (ref 70–99)

## 2022-04-19 MED ORDER — TRESIBA FLEXTOUCH 100 UNIT/ML ~~LOC~~ SOPN
15.0000 [IU] | PEN_INJECTOR | Freq: Every day | SUBCUTANEOUS | 3 refills | Status: DC
  Filled 2022-04-19: qty 6, 40d supply, fill #0
  Filled 2022-06-06: qty 6, 40d supply, fill #1
  Filled 2022-07-30: qty 6, 40d supply, fill #2
  Filled 2022-09-06: qty 6, 40d supply, fill #3
  Filled 2022-10-09: qty 6, 40d supply, fill #0
  Filled 2022-11-20: qty 6, 40d supply, fill #1

## 2022-04-19 MED ORDER — CARVEDILOL 6.25 MG PO TABS
6.2500 mg | ORAL_TABLET | Freq: Two times a day (BID) | ORAL | 1 refills | Status: DC
  Filled 2022-04-19 – 2022-05-07 (×4): qty 180, 90d supply, fill #0
  Filled 2022-07-30: qty 180, 90d supply, fill #1

## 2022-04-19 MED ORDER — METFORMIN HCL 500 MG PO TABS
1000.0000 mg | ORAL_TABLET | Freq: Two times a day (BID) | ORAL | 1 refills | Status: DC
  Filled 2022-04-19 – 2022-05-07 (×4): qty 360, 90d supply, fill #0
  Filled 2022-07-30: qty 360, 90d supply, fill #1

## 2022-04-19 MED ORDER — AMLODIPINE BESYLATE 10 MG PO TABS
10.0000 mg | ORAL_TABLET | Freq: Every day | ORAL | 1 refills | Status: DC
  Filled 2022-04-19 – 2022-05-07 (×4): qty 90, 90d supply, fill #0
  Filled 2022-07-30: qty 90, 90d supply, fill #1

## 2022-04-19 MED ORDER — MECLIZINE HCL 25 MG PO TABS
25.0000 mg | ORAL_TABLET | Freq: Three times a day (TID) | ORAL | 0 refills | Status: DC | PRN
  Filled 2022-04-19: qty 30, 10d supply, fill #0

## 2022-04-19 MED ORDER — BD PEN NEEDLE NANO U/F 32G X 4 MM MISC
1.0000 [IU] | Freq: Every day | 6 refills | Status: DC
  Filled 2022-04-19 – 2022-10-09 (×3): qty 100, 90d supply, fill #0
  Filled 2023-01-01: qty 100, 90d supply, fill #1

## 2022-04-19 MED ORDER — ATORVASTATIN CALCIUM 40 MG PO TABS
40.0000 mg | ORAL_TABLET | Freq: Every day | ORAL | 1 refills | Status: DC
  Filled 2022-04-19 – 2022-05-07 (×4): qty 90, 90d supply, fill #0
  Filled 2022-07-30: qty 90, 90d supply, fill #1

## 2022-04-19 MED ORDER — GABAPENTIN 300 MG PO CAPS
600.0000 mg | ORAL_CAPSULE | Freq: Two times a day (BID) | ORAL | 1 refills | Status: DC
  Filled 2022-04-19 – 2022-05-07 (×4): qty 360, 90d supply, fill #0
  Filled 2022-07-30: qty 360, 90d supply, fill #1

## 2022-04-19 MED ORDER — LISINOPRIL 20 MG PO TABS
30.0000 mg | ORAL_TABLET | Freq: Every day | ORAL | 1 refills | Status: DC
  Filled 2022-04-19 – 2022-05-07 (×4): qty 135, 90d supply, fill #0
  Filled 2022-07-30: qty 135, 90d supply, fill #1

## 2022-04-19 NOTE — Progress Notes (Signed)
Patient ID: Frederick Moody, male   DOB: Jul 28, 1955, 66 y.o.   MRN: [id number]   Frederick Moody, is a 66 y.o. male  [id number]  [id number]  DOB - 12-26-1955  Chief Complaint  Patient presents with   Diabetes       Subjective:   Frederick Moody is a 66 y.o. male here today with his daughter.  He has a list of fasting and bedtime blood sugar readings with a range of 90 to 196.  Most numbers 120-150.  No hypoglycemic episodes.  No blood sugars above 200.  Doing well on meds.  BP at home about the same as here.  Compliant with meds.  Interested in [place].  No problems updated.  ALLERGIES: No Known Allergies  PAST MEDICAL HISTORY: Past Medical History:  Diagnosis Date   Diabetes mellitus without complication ([place])    ETOH abuse     MEDICATIONS AT HOME: Prior to Admission medications   Medication Sig Start Date End Date Taking? Authorizing Provider  Accu-Chek Softclix Lancets lancets Use as instructed three times daily before meals DX E11.49 10/16/21  Yes Newlin, Enobong, MD  blood glucose meter kit and supplies Please check your blood sugars twice daily. Once in the morning and once before bed. 02/12/19  Yes Helberg, Larkin Ina, MD  Blood Glucose Monitoring Suppl (ACCU-CHEK GUIDE ME) w/Device KIT USE THREE TIMES DAILY BEFORE MEALS DX E11.49 11/03/21  Yes Newlin, Enobong, MD  glucose blood (ACCU-CHEK GUIDE) test strip Use to check blood sugar three times daily. Dx E11.69 10/16/21  Yes Charlott Rakes, MD  amLODipine (NORVASC) 10 MG tablet Take 1 tablet (10 mg total) by mouth daily. 04/19/22 04/19/23  Argentina Donovan, PA-C  atorvastatin (LIPITOR) 40 MG tablet Take 1 tablet (40 mg total) by mouth daily. 04/19/22 11/15/22  Argentina Donovan, PA-C  carvedilol (COREG) 6.25 MG tablet Take 1 tablet (6.25 mg total) by mouth 2 (two) times daily with a meal. 04/19/22 11/15/22  Jadelyn Elks, Dionne Bucy, PA-C  gabapentin (NEURONTIN) 300 MG capsule Take 2 capsules (600 mg total) by mouth 2 (two)  times daily. 04/19/22 04/19/23  Argentina Donovan, PA-C  insulin degludec (TRESIBA FLEXTOUCH) 100 UNIT/ML FlexTouch Pen Inject 15 Units into the skin daily. 04/19/22 04/19/23  Argentina Donovan, PA-C  Insulin Pen Needle (BD PEN NEEDLE NANO U/F) 32G X 4 MM MISC Use as directed. 04/19/22   Argentina Donovan, PA-C  lisinopril (ZESTRIL) 20 MG tablet Take 1.5 tablets (30 mg total) by mouth daily. 04/19/22   Argentina Donovan, PA-C  meclizine (ANTIVERT) 25 MG tablet Take 1 tablet (25 mg total) by mouth 3 (three) times daily as needed for dizziness. 04/19/22   Argentina Donovan, PA-C  metFORMIN (GLUCOPHAGE) 500 MG tablet Take 2 tablets (1,000 mg total) by mouth 2 (two) times daily with a meal. 04/19/22   Dovid Bartko, Dionne Bucy, PA-C    ROS: Neg HEENT Neg resp Neg cardiac Neg GI Neg GU Neg MS Neg psych Neg neuro  Objective:   Vitals:   04/19/22 1355 04/19/22 1408  BP: (!) 152/82 (!) 148/88  Pulse: 84   SpO2: 97%   Weight: 181 lb 9.6 oz (82.4 kg)    Exam General appearance : Awake, alert, not in any distress. Speech Clear. Not toxic looking HEENT: Atraumatic and Normocephalic Neck: Supple, no JVD. No cervical lymphadenopathy.  Chest: Good air entry bilaterally, CTAB.  No rales/rhonchi/wheezing CVS: S1 S2 regular, no murmurs.  Extremities: B/L Lower Ext shows no edema, both legs are  warm to touch Neurology: Awake alert, and oriented X 3, CN II-XII intact, Non focal Skin: No Rash  Data Review Lab Results  Component Value Date   HGBA1C 5.6 04/19/2022   HGBA1C 5.6 10/16/2021   HGBA1C 5.7 04/20/2021    Assessment & Plan   1. Type 2 diabetes mellitus with other specified complication, with long-term current use of insulin (HCC) Controlled with no hypoglycemic episodes.  Does not need/meet requirements for dexcom/continuous meter at this time.  Continue current regimen - Glucose (CBG) - HgB A1c - Comprehensive metabolic panel - CBC with Differential/Platelet - insulin degludec  (TRESIBA FLEXTOUCH) 100 UNIT/ML FlexTouch Pen; Inject 15 Units into the skin daily.  Dispense: 9 mL; Refill: 3 - Insulin Pen Needle (BD PEN NEEDLE NANO U/F) 32G X 4 MM MISC; Use as directed.  Dispense: 100 each; Refill: 6 - metFORMIN (GLUCOPHAGE) 500 MG tablet; Take 2 tablets (1,000 mg total) by mouth 2 (two) times daily with a meal.  Dispense: 360 tablet; Refill: 1 - atorvastatin (LIPITOR) 40 MG tablet; Take 1 tablet (40 mg total) by mouth daily.  Dispense: 90 tablet; Refill: 1  2. Hypertension associated with diabetes ([place]) Uncontrolled-increase dose lisinopril from 20 to 30 - Comprehensive metabolic panel - CBC with Differential/Platelet - lisinopril (ZESTRIL) 20 MG tablet; Take 1.5 tablets (30 mg total) by mouth daily.  Dispense: 135 tablet; Refill: 1 - amLODipine (NORVASC) 10 MG tablet; Take 1 tablet (10 mg total) by mouth daily.  Dispense: 90 tablet; Refill: 1 - carvedilol (COREG) 6.25 MG tablet; Take 1 tablet (6.25 mg total) by mouth 2 (two) times daily with a meal.  Dispense: 180 tablet; Refill: 1  3. Hyponatremia At last visit and no recheck yet - Comprehensive metabolic panel  4. Type 2 diabetes mellitus with other neurologic complication, with long-term current use of insulin (HCC) - insulin degludec (TRESIBA FLEXTOUCH) 100 UNIT/ML FlexTouch Pen; Inject 15 Units into the skin daily.  Dispense: 9 mL; Refill: 3 - metFORMIN (GLUCOPHAGE) 500 MG tablet; Take 2 tablets (1,000 mg total) by mouth 2 (two) times daily with a meal.  Dispense: 360 tablet; Refill: 1 - gabapentin (NEURONTIN) 300 MG capsule; Take 2 capsules (600 mg total) by mouth 2 (two) times daily.  Dispense: 360 capsule; Refill: 1  5. Vertigo Stable/not changed.  occurs intermittently - meclizine (ANTIVERT) 25 MG tablet; Take 1 tablet (25 mg total) by mouth 3 (three) times daily as needed for dizziness.  Dispense: 30 tablet; Refill: 0    Return for 4 to 6 months with PCP.  The patient was given clear instructions to go  to ER or return to medical center if symptoms don't improve, worsen or new problems develop. The patient verbalized understanding. The patient was told to call to get lab results if they haven't heard anything in the next week.      Freeman Caldron, PA-C [facility] and [place], [place]   04/19/2022, 2:20 PM

## 2022-04-19 NOTE — Patient Instructions (Signed)
Check blood sugars 3 times weekly fasting.  Goal under 150

## 2022-04-20 LAB — COMPREHENSIVE METABOLIC PANEL
ALT: 12 IU/L (ref 0–44)
AST: 17 IU/L (ref 0–40)
Albumin/Globulin Ratio: 2 (ref 1.2–2.2)
Albumin: 4.9 g/dL (ref 3.9–4.9)
Alkaline Phosphatase: 53 IU/L (ref 44–121)
BUN/Creatinine Ratio: 15 (ref 10–24)
BUN: 12 mg/dL (ref 8–27)
Bilirubin Total: 0.6 mg/dL (ref 0.0–1.2)
CO2: 22 mmol/L (ref 20–29)
Calcium: 10.1 mg/dL (ref 8.6–10.2)
Chloride: 92 mmol/L — ABNORMAL LOW (ref 96–106)
Creatinine, Ser: 0.81 mg/dL (ref 0.76–1.27)
Globulin, Total: 2.4 g/dL (ref 1.5–4.5)
Glucose: 89 mg/dL (ref 70–99)
Potassium: 5.5 mmol/L — ABNORMAL HIGH (ref 3.5–5.2)
Sodium: 131 mmol/L — ABNORMAL LOW (ref 134–144)
Total Protein: 7.3 g/dL (ref 6.0–8.5)
eGFR: 97 mL/min/{1.73_m2} (ref >59)

## 2022-04-20 LAB — CBC WITH DIFFERENTIAL/PLATELET
Basophils Absolute: 0.1 10*3/uL (ref 0.0–0.2)
Basos: 1 %
EOS (ABSOLUTE): 0.5 10*3/uL — ABNORMAL HIGH (ref 0.0–0.4)
Eos: 5 %
Hematocrit: 39.1 % (ref 37.5–51.0)
Hemoglobin: 13.6 g/dL (ref 13.0–17.7)
Immature Grans (Abs): 0 10*3/uL (ref 0.0–0.1)
Immature Granulocytes: 0 %
Lymphocytes Absolute: 2.6 10*3/uL (ref 0.7–3.1)
Lymphs: 26 %
MCH: 33.4 pg — ABNORMAL HIGH (ref 26.6–33.0)
MCHC: 34.8 g/dL (ref 31.5–35.7)
MCV: 96 fL (ref 79–97)
Monocytes Absolute: 0.7 10*3/uL (ref 0.1–0.9)
Monocytes: 7 %
Neutrophils Absolute: 6 10*3/uL (ref 1.4–7.0)
Neutrophils: 61 %
Platelets: 247 10*3/uL (ref 150–450)
RBC: 4.07 x10E6/uL — ABNORMAL LOW (ref 4.14–5.80)
RDW: 13.3 % (ref 11.6–15.4)
WBC: 9.9 10*3/uL (ref 3.4–10.8)

## 2022-04-22 ENCOUNTER — Other Ambulatory Visit: Payer: Self-pay | Admitting: Physician Assistant

## 2022-04-22 DIAGNOSIS — E875 Hyperkalemia: Secondary | ICD-10-CM

## 2022-04-22 DIAGNOSIS — E871 Hypo-osmolality and hyponatremia: Secondary | ICD-10-CM

## 2022-05-07 ENCOUNTER — Other Ambulatory Visit: Payer: Self-pay | Admitting: Physician Assistant

## 2022-05-07 ENCOUNTER — Other Ambulatory Visit (HOSPITAL_COMMUNITY): Payer: Self-pay

## 2022-05-07 ENCOUNTER — Other Ambulatory Visit: Payer: Self-pay

## 2022-05-07 DIAGNOSIS — R42 Dizziness and giddiness: Secondary | ICD-10-CM

## 2022-05-07 MED ORDER — MECLIZINE HCL 25 MG PO TABS
25.0000 mg | ORAL_TABLET | Freq: Three times a day (TID) | ORAL | 0 refills | Status: DC | PRN
  Filled 2022-05-07: qty 90, 30d supply, fill #0

## 2022-05-08 ENCOUNTER — Other Ambulatory Visit: Payer: Self-pay

## 2022-05-09 ENCOUNTER — Ambulatory Visit (INDEPENDENT_AMBULATORY_CARE_PROVIDER_SITE_OTHER): Payer: Medicare Other | Admitting: Podiatry

## 2022-05-09 DIAGNOSIS — M79674 Pain in right toe(s): Secondary | ICD-10-CM | POA: Diagnosis not present

## 2022-05-09 DIAGNOSIS — M79675 Pain in left toe(s): Secondary | ICD-10-CM

## 2022-05-09 DIAGNOSIS — B351 Tinea unguium: Secondary | ICD-10-CM | POA: Diagnosis not present

## 2022-05-09 NOTE — Progress Notes (Addendum)
This patient presents to the office with chief complaint of long thick painful nails.  Patient says the nails are painful walking and wearing shoes.  This patient is unable to self treat.  This patient is unable to trim his nails since she is unable to reach his nails.  She presents to the office for preventative foot care services.  General Appearance  Alert, conversant and in no acute stress.  Vascular  Dorsalis pedis and posterior tibial  pulses are palpable  bilaterally.  Capillary return is within normal limits  bilaterally.   Neurologic  Senn-Weinstein monofilament wire test within normal limits  bilaterally. Muscle power within normal limits bilaterally.  Nails Thick disfigured discolored nails with subungual debris  from hallux to fifth toes bilaterally. No evidence of bacterial infection or drainage bilaterally.  Orthopedic  No limitations of motion  feet .  No crepitus or effusions noted.  No bony pathology or digital deformities noted.  DJD 1st MPJ right foot.  Skin  normotropic skin with no porokeratosis noted bilaterally.  No signs of infections or ulcers noted.     Onychomycosis  Nails  B/L.  Pain in right toes  Pain in left toes  Debridement of nails both feet followed trimming the nails with dremel tool.    RTC 3 months.   Scherrie Seneca DPM   

## 2022-06-06 ENCOUNTER — Other Ambulatory Visit: Payer: Self-pay

## 2022-06-08 ENCOUNTER — Other Ambulatory Visit: Payer: Self-pay

## 2022-06-27 ENCOUNTER — Other Ambulatory Visit: Payer: Self-pay | Admitting: Family Medicine

## 2022-06-27 ENCOUNTER — Other Ambulatory Visit: Payer: Self-pay

## 2022-06-27 DIAGNOSIS — R42 Dizziness and giddiness: Secondary | ICD-10-CM

## 2022-06-28 ENCOUNTER — Other Ambulatory Visit: Payer: Self-pay

## 2022-06-28 MED ORDER — MECLIZINE HCL 25 MG PO TABS
25.0000 mg | ORAL_TABLET | Freq: Three times a day (TID) | ORAL | 0 refills | Status: DC | PRN
  Filled 2022-06-28 (×4): qty 90, 30d supply, fill #0

## 2022-07-30 ENCOUNTER — Other Ambulatory Visit: Payer: Self-pay | Admitting: Family Medicine

## 2022-07-30 ENCOUNTER — Other Ambulatory Visit: Payer: Self-pay

## 2022-07-30 DIAGNOSIS — R42 Dizziness and giddiness: Secondary | ICD-10-CM

## 2022-07-31 NOTE — Telephone Encounter (Signed)
Requested medication (s) are due for refill today: yes  Requested medication (s) are on the active medication list: yes  Last refill:  06/28/22  Future visit scheduled: yes  Notes to clinic:  Unable to refill per protocol, cannot delegate.      Requested Prescriptions  Pending Prescriptions Disp Refills   meclizine (ANTIVERT) 25 MG tablet 90 tablet 0    Sig: Take 1 tablet (25 mg total) by mouth 3 (three) times daily as needed for dizziness.     Not Delegated - Gastroenterology: Antiemetics Failed - 07/30/2022  3:52 PM      Failed - This refill cannot be delegated      Passed - Valid encounter within last 6 months    Recent Outpatient Visits           3 months ago Type 2 diabetes mellitus with other specified complication, with long-term current use of insulin [facility])   [place], [place], [place]   9 months ago Type 2 diabetes mellitus with other specified complication, with long-term current use of insulin ([place])   [place], [place], MD   1 year ago Type 2 diabetes mellitus with other specified complication, with long-term current use of insulin ([place])   [employer], Charlane Ferretti, MD   1 year ago Type 2 diabetes mellitus with other specified complication, with long-term current use of insulin ([place])   [place], Charlane Ferretti, MD   2 years ago Type 2 diabetes mellitus with other neurologic complication, with long-term current use of insulin [facility])   [place] Charlott Rakes, MD       Future Appointments             In 1 week [place], [place]

## 2022-08-01 ENCOUNTER — Other Ambulatory Visit: Payer: Self-pay

## 2022-08-01 MED ORDER — MECLIZINE HCL 25 MG PO TABS
25.0000 mg | ORAL_TABLET | Freq: Three times a day (TID) | ORAL | 0 refills | Status: DC | PRN
  Filled 2022-08-01: qty 90, 30d supply, fill #0

## 2022-08-09 ENCOUNTER — Encounter: Payer: Self-pay | Admitting: Physician Assistant

## 2022-08-09 ENCOUNTER — Ambulatory Visit: Payer: Medicare Other | Attending: Family Medicine | Admitting: Physician Assistant

## 2022-08-09 ENCOUNTER — Other Ambulatory Visit: Payer: Self-pay

## 2022-08-09 VITALS — BP 132/83 | HR 61 | Ht 68.5 in | Wt 187.6 lb

## 2022-08-09 DIAGNOSIS — E871 Hypo-osmolality and hyponatremia: Secondary | ICD-10-CM | POA: Insufficient documentation

## 2022-08-09 DIAGNOSIS — Z79899 Other long term (current) drug therapy: Secondary | ICD-10-CM | POA: Insufficient documentation

## 2022-08-09 DIAGNOSIS — E1159 Type 2 diabetes mellitus with other circulatory complications: Secondary | ICD-10-CM | POA: Diagnosis not present

## 2022-08-09 DIAGNOSIS — Z794 Long term (current) use of insulin: Secondary | ICD-10-CM | POA: Diagnosis not present

## 2022-08-09 DIAGNOSIS — Z7984 Long term (current) use of oral hypoglycemic drugs: Secondary | ICD-10-CM | POA: Insufficient documentation

## 2022-08-09 DIAGNOSIS — E875 Hyperkalemia: Secondary | ICD-10-CM | POA: Insufficient documentation

## 2022-08-09 DIAGNOSIS — E1169 Type 2 diabetes mellitus with other specified complication: Secondary | ICD-10-CM | POA: Diagnosis not present

## 2022-08-09 DIAGNOSIS — I152 Hypertension secondary to endocrine disorders: Secondary | ICD-10-CM

## 2022-08-09 DIAGNOSIS — R42 Dizziness and giddiness: Secondary | ICD-10-CM | POA: Insufficient documentation

## 2022-08-09 LAB — POCT GLYCOSYLATED HEMOGLOBIN (HGB A1C): HbA1c, POC (controlled diabetic range): 5.9 % (ref 0.0–7.0)

## 2022-08-09 LAB — GLUCOSE, POCT (MANUAL RESULT ENTRY): POC Glucose: 97 mg/dl (ref 70–99)

## 2022-08-09 MED ORDER — GABAPENTIN 300 MG PO CAPS
600.0000 mg | ORAL_CAPSULE | Freq: Two times a day (BID) | ORAL | 1 refills | Status: DC
  Filled 2022-08-09 – 2022-10-30 (×3): qty 360, 90d supply, fill #0
  Filled 2023-01-30: qty 360, 90d supply, fill #1

## 2022-08-09 MED ORDER — ATORVASTATIN CALCIUM 40 MG PO TABS
40.0000 mg | ORAL_TABLET | Freq: Every day | ORAL | 1 refills | Status: DC
  Filled 2022-08-09 – 2022-10-30 (×3): qty 90, 90d supply, fill #0
  Filled 2023-01-30: qty 90, 90d supply, fill #1

## 2022-08-09 MED ORDER — METFORMIN HCL 500 MG PO TABS
1000.0000 mg | ORAL_TABLET | Freq: Two times a day (BID) | ORAL | 1 refills | Status: DC
  Filled 2022-08-09 – 2022-10-30 (×3): qty 360, 90d supply, fill #0
  Filled 2023-01-30: qty 360, 90d supply, fill #1

## 2022-08-09 MED ORDER — MECLIZINE HCL 25 MG PO TABS
25.0000 mg | ORAL_TABLET | Freq: Three times a day (TID) | ORAL | 0 refills | Status: DC | PRN
  Filled 2022-08-09 – 2022-10-30 (×3): qty 90, 30d supply, fill #0

## 2022-08-09 MED ORDER — AMLODIPINE BESYLATE 10 MG PO TABS
10.0000 mg | ORAL_TABLET | Freq: Every day | ORAL | 1 refills | Status: DC
  Filled 2022-08-09 – 2022-10-30 (×3): qty 90, 90d supply, fill #0
  Filled 2023-01-30: qty 90, 90d supply, fill #1

## 2022-08-09 MED ORDER — CARVEDILOL 6.25 MG PO TABS
6.2500 mg | ORAL_TABLET | Freq: Two times a day (BID) | ORAL | 1 refills | Status: DC
  Filled 2022-08-09 – 2022-10-30 (×3): qty 180, 90d supply, fill #0
  Filled 2023-01-30: qty 180, 90d supply, fill #1

## 2022-08-09 MED ORDER — LISINOPRIL 20 MG PO TABS
30.0000 mg | ORAL_TABLET | Freq: Every day | ORAL | 1 refills | Status: DC
  Filled 2022-08-09 – 2022-10-30 (×3): qty 135, 90d supply, fill #0

## 2022-08-09 NOTE — Progress Notes (Signed)
Patient ID: Frederick Moody, male   DOB: December 13, 1955, 67 y.o.   MRN: [id number]   Frederick Moody, is a 67 y.o. male  [id number]  [id number]  DOB - [birth year]  Chief Complaint  Patient presents with   Diabetes       Subjective:   Frederick Moody is a 67 y.o. male here today for med RF.  Vertigo is stable-usu takes 1 meclizine a day.  Sometimes 2.  Appetite is good.  No cramping.  No CP/SOB.  Doing well and compliant with meds.  Sees podiatry next week.  No palpitations.    No problems updated.  ALLERGIES: No Known Allergies  PAST MEDICAL HISTORY: Past Medical History:  Diagnosis Date   Diabetes mellitus without complication ([place])    ETOH abuse     MEDICATIONS AT HOME: Prior to Admission medications   Medication Sig Start Date End Date Taking? Authorizing Provider  Accu-Chek Softclix Lancets lancets Use as instructed three times daily before meals DX E11.49 10/16/21  Yes Newlin, Enobong, MD  blood glucose meter kit and supplies Please check your blood sugars twice daily. Once in the morning and once before bed. 02/12/19  Yes Helberg, Larkin Ina, MD  Blood Glucose Monitoring Suppl (ACCU-CHEK GUIDE ME) w/Device KIT USE THREE TIMES DAILY BEFORE MEALS DX E11.49 11/03/21  Yes Newlin, Enobong, MD  glucose blood (ACCU-CHEK GUIDE) test strip Use to check blood sugar three times daily. Dx E11.69 10/16/21  Yes Charlott Rakes, MD  insulin degludec (TRESIBA FLEXTOUCH) 100 UNIT/ML FlexTouch Pen Inject 15 Units into the skin daily. 04/19/22 04/19/23 Yes Charish Schroepfer, Dionne Bucy, PA-C  Insulin Pen Needle (BD PEN NEEDLE NANO U/F) 32G X 4 MM MISC Use as directed. 04/19/22  Yes Freeman Caldron M, PA-C  amLODipine (NORVASC) 10 MG tablet Take 1 tablet (10 mg total) by mouth daily. 08/09/22   Argentina Donovan, PA-C  atorvastatin (LIPITOR) 40 MG tablet Take 1 tablet (40 mg total) by mouth daily. 08/09/22   Argentina Donovan, PA-C  carvedilol (COREG) 6.25 MG tablet Take 1 tablet (6.25 mg total) by  mouth 2 (two) times daily with a meal. 08/09/22   Chaeli Judy, Dionne Bucy, PA-C  gabapentin (NEURONTIN) 300 MG capsule Take 2 capsules (600 mg total) by mouth 2 (two) times daily. 08/09/22   Argentina Donovan, PA-C  lisinopril (ZESTRIL) 20 MG tablet Take 1.5 tablets (30 mg total) by mouth daily. 08/09/22   Argentina Donovan, PA-C  meclizine (ANTIVERT) 25 MG tablet Take 1 tablet (25 mg total) by mouth 3 (three) times daily as needed for dizziness. 08/09/22   Argentina Donovan, PA-C  metFORMIN (GLUCOPHAGE) 500 MG tablet Take 2 tablets (1,000 mg total) by mouth 2 (two) times daily with a meal. 08/09/22   Janaiyah Blackard, Dionne Bucy, PA-C    ROS: Neg HEENT Neg resp Neg cardiac Neg GI Neg GU Neg MS Neg psych Neg neuro  Objective:   Vitals:   08/09/22 1032  BP: 132/83  Pulse: 61  SpO2: 98%  Weight: 187 lb 9.6 oz (85.1 kg)  Height: 5' 8.5" (1.74 m)   Exam General appearance : Awake, alert, not in any distress. Speech Clear. Not toxic looking HEENT: Atraumatic and Normocephalic Neck: Supple, no JVD. No cervical lymphadenopathy.  Chest: Good air entry bilaterally, CTAB.  No rales/rhonchi/wheezing CVS: S1 S2 regular, no murmurs.  Extremities: B/L Lower Ext shows no edema, both legs are warm to touch Neurology: Awake alert, and oriented X 3, CN II-XII intact, Non focal Skin: No Rash  Data Review Lab Results  Component Value Date   HGBA1C 5.9 08/09/2022   HGBA1C 5.6 04/19/2022   HGBA1C 5.6 10/16/2021    Assessment & Plan   1. Type 2 diabetes mellitus with other specified complication, with long-term current use of insulin (HCC) controlled - Glucose (CBG) - HgB A1c - metFORMIN (GLUCOPHAGE) 500 MG tablet; Take 2 tablets (1,000 mg total) by mouth 2 (two) times daily with a meal.  Dispense: 360 tablet; Refill: 1 - gabapentin (NEURONTIN) 300 MG capsule; Take 2 capsules (600 mg total) by mouth 2 (two) times daily.  Dispense: 360 capsule; Refill: 1 - atorvastatin (LIPITOR) 40 MG tablet; Take 1 tablet  (40 mg total) by mouth daily.  Dispense: 90 tablet; Refill: 1 - Comprehensive metabolic panel - Lipid panel  2. Hypertension associated with diabetes ([place]) controlled - lisinopril (ZESTRIL) 20 MG tablet; Take 1.5 tablets (30 mg total) by mouth daily.  Dispense: 135 tablet; Refill: 1 - carvedilol (COREG) 6.25 MG tablet; Take 1 tablet (6.25 mg total) by mouth 2 (two) times daily with a meal.  Dispense: 180 tablet; Refill: 1 - amLODipine (NORVASC) 10 MG tablet; Take 1 tablet (10 mg total) by mouth daily.  Dispense: 90 tablet; Refill: 1  3. Vertigo stable - meclizine (ANTIVERT) 25 MG tablet; Take 1 tablet (25 mg total) by mouth 3 (three) times daily as needed for dizziness.  Dispense: 90 tablet; Refill: 0  4. Hyperkalemia- 5. Hyponatremia-will refer if still low     Return in about 4 months (around 12/08/2022) for PCP for chronic conditions.  The patient was given clear instructions to go to ER or return to medical center if symptoms don't improve, worsen or new problems develop. The patient verbalized understanding. The patient was told to call to get lab results if they haven't heard anything in the next week.      Freeman Caldron, PA-C [facility] and [facility] [place], [place]   08/09/2022, 10:51 AM

## 2022-08-10 LAB — COMPREHENSIVE METABOLIC PANEL
ALT: 10 IU/L (ref 0–44)
AST: 16 IU/L (ref 0–40)
Albumin/Globulin Ratio: 1.8 (ref 1.2–2.2)
Albumin: 4.6 g/dL (ref 3.9–4.9)
Alkaline Phosphatase: 52 IU/L (ref 44–121)
BUN/Creatinine Ratio: 18 (ref 10–24)
BUN: 13 mg/dL (ref 8–27)
Bilirubin Total: 0.6 mg/dL (ref 0.0–1.2)
CO2: 19 mmol/L — ABNORMAL LOW (ref 20–29)
Calcium: 9.9 mg/dL (ref 8.6–10.2)
Chloride: 94 mmol/L — ABNORMAL LOW (ref 96–106)
Creatinine, Ser: 0.73 mg/dL — ABNORMAL LOW (ref 0.76–1.27)
Globulin, Total: 2.5 g/dL (ref 1.5–4.5)
Glucose: 89 mg/dL (ref 70–99)
Potassium: 5 mmol/L (ref 3.5–5.2)
Sodium: 132 mmol/L — ABNORMAL LOW (ref 134–144)
Total Protein: 7.1 g/dL (ref 6.0–8.5)
eGFR: 100 mL/min/{1.73_m2} (ref >59)

## 2022-08-10 LAB — LIPID PANEL
Chol/HDL Ratio: 2.3 ratio (ref 0.0–5.0)
Cholesterol, Total: 122 mg/dL (ref 100–199)
HDL: 52 mg/dL (ref >39)
LDL Chol Calc (NIH): 56 mg/dL (ref 0–99)
Triglycerides: 68 mg/dL (ref 0–149)
VLDL Cholesterol Cal: 14 mg/dL (ref 5–40)

## 2022-08-13 ENCOUNTER — Encounter: Payer: Self-pay | Admitting: Podiatry

## 2022-08-13 ENCOUNTER — Ambulatory Visit (INDEPENDENT_AMBULATORY_CARE_PROVIDER_SITE_OTHER): Payer: Medicare Other | Admitting: Podiatry

## 2022-08-13 DIAGNOSIS — M79674 Pain in right toe(s): Secondary | ICD-10-CM

## 2022-08-13 DIAGNOSIS — M79675 Pain in left toe(s): Secondary | ICD-10-CM

## 2022-08-13 DIAGNOSIS — B351 Tinea unguium: Secondary | ICD-10-CM | POA: Diagnosis not present

## 2022-08-13 NOTE — Progress Notes (Signed)
This patient presents to the office with chief complaint of long thick painful nails.  Patient says the nails are painful walking and wearing shoes.  This patient is unable to self treat.  This patient is unable to trim his nails since she is unable to reach his nails.  She presents to the office for preventative foot care services.  General Appearance  Alert, conversant and in no acute stress.  Vascular  Dorsalis pedis and posterior tibial  pulses are palpable  bilaterally.  Capillary return is within normal limits  bilaterally.   Neurologic  Senn-Weinstein monofilament wire test within normal limits  bilaterally. Muscle power within normal limits bilaterally.  Nails Thick disfigured discolored nails with subungual debris  from hallux to fifth toes bilaterally. No evidence of bacterial infection or drainage bilaterally.  Orthopedic  No limitations of motion  feet .  No crepitus or effusions noted.  No bony pathology or digital deformities noted.  DJD 1st MPJ right foot.  Skin  normotropic skin with no porokeratosis noted bilaterally.  No signs of infections or ulcers noted.     Onychomycosis  Nails  B/L.  Pain in right toes  Pain in left toes  Debridement of nails both feet followed trimming the nails with dremel tool.    RTC 3 months.   Gardiner Barefoot DPM

## 2022-08-16 ENCOUNTER — Ambulatory Visit: Payer: Medicare Other | Admitting: Physician Assistant

## 2022-08-20 ENCOUNTER — Ambulatory Visit: Payer: Medicare Other | Admitting: Family Medicine

## 2022-08-24 ENCOUNTER — Telehealth: Payer: Self-pay | Admitting: Family Medicine

## 2022-08-24 NOTE — Telephone Encounter (Signed)
Patient returned my call.  He stated he was ask th medicare questions at last appt  call back next year

## 2022-08-24 NOTE — Telephone Encounter (Signed)
Called patient to schedule Medicare Annual Wellness Visit (AWV). Left message for patient to call back and schedule Medicare Annual Wellness Visit (AWV).  Last date of AWV: 01/02/21  If any questions, please contact me at [phone].  Thank you ,  Barkley Boards AWV direct phone # [phone]

## 2022-09-06 ENCOUNTER — Other Ambulatory Visit: Payer: Self-pay

## 2022-10-09 ENCOUNTER — Other Ambulatory Visit: Payer: Self-pay

## 2022-10-09 ENCOUNTER — Other Ambulatory Visit (HOSPITAL_COMMUNITY): Payer: Self-pay

## 2022-10-30 ENCOUNTER — Other Ambulatory Visit: Payer: Self-pay

## 2022-10-31 ENCOUNTER — Other Ambulatory Visit: Payer: Self-pay

## 2022-10-31 ENCOUNTER — Other Ambulatory Visit (HOSPITAL_COMMUNITY): Payer: Self-pay

## 2022-11-01 ENCOUNTER — Encounter: Payer: Self-pay | Admitting: Pharmacist

## 2022-11-01 ENCOUNTER — Other Ambulatory Visit (HOSPITAL_COMMUNITY): Payer: Self-pay

## 2022-11-01 ENCOUNTER — Other Ambulatory Visit: Payer: Self-pay

## 2022-11-12 ENCOUNTER — Ambulatory Visit (INDEPENDENT_AMBULATORY_CARE_PROVIDER_SITE_OTHER): Payer: Medicare Other | Admitting: Podiatry

## 2022-11-12 ENCOUNTER — Encounter: Payer: Self-pay | Admitting: Podiatry

## 2022-11-12 DIAGNOSIS — M79674 Pain in right toe(s): Secondary | ICD-10-CM

## 2022-11-12 DIAGNOSIS — M79675 Pain in left toe(s): Secondary | ICD-10-CM | POA: Diagnosis not present

## 2022-11-12 DIAGNOSIS — B351 Tinea unguium: Secondary | ICD-10-CM | POA: Diagnosis not present

## 2022-11-12 NOTE — Progress Notes (Signed)
This patient presents to the office with chief complaint of long thick painful nails.  Patient says the nails are painful walking and wearing shoes.  This patient is unable to self treat.  This patient is unable to trim his nails since she is unable to reach his nails.  She presents to the office for preventative foot care services. Patient is diabetic.  General Appearance  Alert, conversant and in no acute stress.  Vascular  Dorsalis pedis and posterior tibial  pulses are palpable  bilaterally.  Capillary return is within normal limits  bilaterally.   Neurologic  Senn-Weinstein monofilament wire test within normal limits  bilaterally. Muscle power within normal limits bilaterally.  Nails Thick disfigured discolored nails with subungual debris  from hallux to fifth toes bilaterally. No evidence of bacterial infection or drainage bilaterally.  Orthopedic  No limitations of motion  feet .  No crepitus or effusions noted.  No bony pathology or digital deformities noted.  DJD 1st MPJ right foot.  Skin  normotropic skin with no porokeratosis noted bilaterally.  No signs of infections or ulcers noted.     Onychomycosis  Nails  B/L.  Pain in right toes  Pain in left toes  Debridement of nails both feet followed trimming the nails with dremel tool.    RTC 3 months.   Helane Gunther DPM

## 2022-11-14 ENCOUNTER — Ambulatory Visit: Payer: Medicare Other | Attending: Family Medicine

## 2022-11-20 NOTE — Progress Notes (Unsigned)
Subjective:   Frederick Moody is a 67 y.o. male who presents for Medicare Annual/Subsequent preventive examination.  Review of Systems    ***       Objective:    There were no vitals filed for this visit. There is no height or weight on file to calculate BMI.     01/02/2021    3:11 PM 08/19/2019    1:34 PM 01/07/2019    9:48 PM 01/07/2019    4:59 PM  Advanced Directives  Does Patient Have a Medical Advance Directive? No No No No  Would patient like information on creating a medical advance directive? No - Patient declined No - Patient declined No - Patient declined No - Patient declined    Current Medications (verified) Outpatient Encounter Medications as of 11/21/2022  Medication Sig   Accu-Chek Softclix Lancets lancets Use as instructed three times daily before meals DX E11.49   amLODipine (NORVASC) 10 MG tablet Take 1 tablet (10 mg total) by mouth daily.   atorvastatin (LIPITOR) 40 MG tablet Take 1 tablet (40 mg total) by mouth daily.   blood glucose meter kit and supplies Please check your blood sugars twice daily. Once in the morning and once before bed.   Blood Glucose Monitoring Suppl (ACCU-CHEK GUIDE ME) w/Device KIT USE THREE TIMES DAILY BEFORE MEALS DX E11.49   carvedilol (COREG) 6.25 MG tablet Take 1 tablet (6.25 mg total) by mouth 2 (two) times daily with a meal.   gabapentin (NEURONTIN) 300 MG capsule Take 2 capsules (600 mg total) by mouth 2 (two) times daily.   glucose blood (ACCU-CHEK GUIDE) test strip Use to check blood sugar three times daily. Dx E11.69   insulin degludec (TRESIBA FLEXTOUCH) 100 UNIT/ML FlexTouch Pen Inject 15 Units into the skin daily.   Insulin Pen Needle (BD PEN NEEDLE NANO U/F) 32G X 4 MM MISC Use as directed.   lisinopril (ZESTRIL) 20 MG tablet Take 1.5 tablets (30 mg total) by mouth daily.   meclizine (ANTIVERT) 25 MG tablet Take 1 tablet (25 mg total) by mouth 3 (three) times daily as needed for dizziness.   metFORMIN (GLUCOPHAGE) 500  MG tablet Take 2 tablets (1,000 mg total) by mouth 2 (two) times daily with a meal.   No facility-administered encounter medications on file as of 11/21/2022.    Allergies (verified) Penicillins   History: Past Medical History:  Diagnosis Date   Diabetes mellitus without complication (HCC)    ETOH abuse    Past Surgical History:  Procedure Laterality Date   APPENDECTOMY     IR THORACENTESIS ASP PLEURAL SPACE W/IMG GUIDE  02/06/2019   IRRIGATION AND DEBRIDEMENT ABSCESS N/A 01/13/2019   Procedure: IRRIGATION AND DEBRIDEMENT OF BACK ABSCESS;  Surgeon: Manus Rudd, MD;  Location: [facility] OR;  Service: General;  Laterality: N/A;   No family history on file. Social History   Socioeconomic History   Marital status: Single    Spouse name: Not on file   Number of children: Not on file   Years of education: Not on file   Highest education level: Not on file  Occupational History   Not on file  Tobacco Use   Smoking status: Never   Smokeless tobacco: Never  Substance and Sexual Activity   Alcohol use: Not Currently   Drug use: Never   Sexual activity: Not on file  Other Topics Concern   Not on file  Social History Narrative   Not on file   Social Determinants of Health  Financial Resource Strain: Low Risk  (01/02/2021)   Overall Financial Resource Strain (CARDIA)    Difficulty of Paying Living Expenses: Not very hard  Food Insecurity: No Food Insecurity (01/02/2021)   Hunger Vital Sign    Worried About Running Out of Food in the Last Year: Never true    Ran Out of Food in the Last Year: Never true  Transportation Needs: No Transportation Needs (01/02/2021)   PRAPARE - Administrator, Civil Service (Medical): No    Lack of Transportation (Non-Medical): No  Physical Activity: Inactive (01/02/2021)   Exercise Vital Sign    Days of Exercise per Week: 0 days    Minutes of Exercise per Session: 0 min  Stress: No Stress Concern Present (01/02/2021)   [employer] of  Occupational Health - Occupational Stress Questionnaire    Feeling of Stress : Not at all  Social Connections: Moderately Integrated (01/02/2021)   Social Connection and Isolation Panel [NHANES]    Frequency of Communication with Friends and Family: More than three times a week    Frequency of Social Gatherings with Friends and Family: Once a week    Attends Religious Services: More than 4 times per year    Active Member of [employer] or Organizations: Yes    Attends Engineer, structural: More than 4 times per year    Marital Status: Divorced    Tobacco Counseling Counseling given: Not Answered   Clinical Intake:                 Diabetic?Yes  Nutrition Risk Assessment:  Has the patient had any N/V/D within the last 2 months?  {YES/NO:21197} Does the patient have any non-healing wounds?  {YES/NO:21197} Has the patient had any unintentional weight loss or weight gain?  {YES/NO:21197}  Diabetes:  Is the patient diabetic?  {YES/NO:21197} If diabetic, was a CBG obtained today?  {YES/NO:21197} Did the patient bring in their glucometer from home?  {YES/NO:21197} How often do you monitor your CBG's? ***.   Financial Strains and Diabetes Management:  Are you having any financial strains with the device, your supplies or your medication? {YES/NO:21197}.  Does the patient want to be seen by Chronic Care Management for management of their diabetes?  {YES/NO:21197} Would the patient like to be referred to a Nutritionist or for Diabetic Management?  {YES/NO:21197}  Diabetic Exams:  Diabetic Eye Exam: Completed 03/28/22 Diabetic Foot Exam: Completed 05/09/22          Activities of Daily Living     No data to display          Patient Care Team: Hoy Register, MD as PCP - General (Family Medicine)  Indicate any recent Medical Services you may have received from other than [facility] providers in the past year (date may be approximate).     Assessment:   This is  a routine wellness examination for Frederick Moody.  Hearing/Vision screen No results found.  Dietary issues and exercise activities discussed:     Goals Addressed   None    Depression Screen    08/09/2022   10:33 AM 04/19/2022    1:57 PM 10/16/2021   11:00 AM 04/20/2021   10:44 AM 01/02/2021    3:03 PM 05/26/2020   11:51 AM 02/19/2019   10:25 AM  PHQ 2/9 Scores  PHQ - 2 Score 0 0 0  0 0 1  PHQ- 9 Score   0   0 6  Exception Documentation    Patient refusal  Fall Risk    08/09/2022   10:33 AM 04/19/2022    1:57 PM 10/16/2021   10:48 AM 04/20/2021   10:37 AM 01/02/2021    3:11 PM  Fall Risk   Falls in the past year? 0 0 0 0 0  Number falls in past yr: 0 0 0 0 0  Injury with Fall? 0 0 0 0 0  Risk for fall due to : No Fall Risks No Fall Risks   No Fall Risks  Follow up     Education provided    FALL RISK PREVENTION PERTAINING TO THE HOME:  Any stairs in or around the home? {YES/NO:21197} If so, are there any without handrails? {YES/NO:21197} Home free of loose throw rugs in walkways, pet beds, electrical cords, etc? {YES/NO:21197} Adequate lighting in your home to reduce risk of falls? {YES/NO:21197}  ASSISTIVE DEVICES UTILIZED TO PREVENT FALLS:  Life alert? {YES/NO:21197} Use of a cane, walker or w/c? {YES/NO:21197} Grab bars in the bathroom? {YES/NO:21197} Shower chair or bench in shower? {YES/NO:21197} Elevated toilet seat or a handicapped toilet? {YES/NO:21197}  TIMED UP AND GO:  Was the test performed? No . Telephonic visit   Cognitive Function:    01/02/2021    3:13 PM  MMSE - Mini Mental State Exam  Orientation to time 5  Orientation to Place 5  Registration 3  Attention/ Calculation 5  Recall 3  Language- name 2 objects 2  Language- repeat 1  Language- follow 3 step command 3  Language- read & follow direction 1  Write a sentence 1  Copy design 1  Total score 30        Immunizations Immunization History  Administered Date(s) Administered    Influenza,inj,Quad PF,6+ Mos 05/13/2019   Pneumococcal Polysaccharide-23 05/13/2019    TDAP status: Due, Education has been provided regarding the importance of this vaccine. Advised may receive this vaccine at local pharmacy or Health Dept. Aware to provide a copy of the vaccination record if obtained from local pharmacy or Health Dept. Verbalized acceptance and understanding.  {Pneumococcal vaccine status:2101807}  Covid-19 vaccine status: Information provided on how to obtain vaccines.   Qualifies for Shingles Vaccine? Yes   Zostavax completed No   Shingrix Completed?: No.    Education has been provided regarding the importance of this vaccine. Patient has been advised to call insurance company to determine out of pocket expense if they have not yet received this vaccine. Advised may also receive vaccine at local pharmacy or Health Dept. Verbalized acceptance and understanding.  Screening Tests Health Maintenance  Topic Date Due   COVID-19 Vaccine (1) Never done   DTaP/Tdap/Td (1 - Tdap) Never done   Fecal DNA (Cologuard)  Never done   Zoster Vaccines- Shingrix (1 of 2) Never done   Pneumonia Vaccine 51+ Years old (2 of 2 - PCV) 06/12/2020   INFLUENZA VACCINE  01/24/2023   HEMOGLOBIN A1C  02/07/2023   OPHTHALMOLOGY EXAM  03/29/2023   FOOT EXAM  05/10/2023   Hepatitis C Screening  Completed   HPV VACCINES  Aged Out    Health Maintenance  Health Maintenance Due  Topic Date Due   COVID-19 Vaccine (1) Never done   DTaP/Tdap/Td (1 - Tdap) Never done   Fecal DNA (Cologuard)  Never done   Zoster Vaccines- Shingrix (1 of 2) Never done   Pneumonia Vaccine 8+ Years old (2 of 2 - PCV) 06/12/2020    {Colorectal cancer screening:2101809}  Lung Cancer Screening: (Low Dose CT Chest recommended  if Age 55-80 years, 30 pack-year currently smoking OR have quit w/in 15years.) does not qualify.   Lung Cancer Screening Referral: n/a  Additional Screening:  Hepatitis C Screening: does  qualify; Completed 03/10/19  Vision Screening: Recommended annual ophthalmology exams for early detection of glaucoma and other disorders of the eye. Is the patient up to date with their annual eye exam?  {YES/NO:21197} Who is the provider or what is the name of the office in which the patient attends annual eye exams? *** If pt is not established with a provider, would they like to be referred to a provider to establish care? {YES/NO:21197}.   Dental Screening: Recommended annual dental exams for proper oral hygiene  Community Resource Referral / Chronic Care Management: CRR required this visit?  {YES/NO:21197}  CCM required this visit?  {YES/NO:21197}     Plan:     I have personally reviewed and noted the following in the patient's chart:   Medical and social history Use of alcohol, tobacco or illicit drugs  Current medications and supplements including opioid prescriptions. {Opioid Prescriptions:[id number]} Functional ability and status Nutritional status Physical activity Advanced directives List of other physicians Hospitalizations, surgeries, and ER visits in previous 12 months Vitals Screenings to include cognitive, depression, and falls Referrals and appointments  In addition, I have reviewed and discussed with patient certain preventive protocols, quality metrics, and best practice recommendations. A written personalized care plan for preventive services as well as general preventive health recommendations were provided to patient.     Durwin Nora, [place]   09/15/4008   Due to this being a virtual visit, the after visit summary with patients personalized plan was offered to patient via mail or my-chart. ***Patient declined at this time./ Patient would like to access on my-chart/ per request, patient was mailed a copy of AVS./ Patient preferred to pick up at office at next visit  Nurse Notes: ***

## 2022-11-21 ENCOUNTER — Ambulatory Visit: Payer: Medicare Other | Attending: Family Medicine

## 2022-11-21 ENCOUNTER — Other Ambulatory Visit (HOSPITAL_COMMUNITY): Payer: Self-pay

## 2022-11-21 VITALS — Ht 68.5 in | Wt 187.0 lb

## 2022-11-21 DIAGNOSIS — Z0000 Encounter for general adult medical examination without abnormal findings: Secondary | ICD-10-CM

## 2022-11-21 NOTE — Patient Instructions (Signed)
[name] , Thank you for taking time to come for your Medicare Wellness Visit. I appreciate your ongoing commitment to your health goals. Please review the following plan we discussed and let me know if I can assist you in the future.   These are the goals we discussed:  Goals      Cut out extra servings     Remain active and independent        This is a list of the screening recommended for you and due dates:  Health Maintenance  Topic Date Due   DTaP/Tdap/Td vaccine (1 - Tdap) Never done   COVID-19 Vaccine (1) 12/07/2022*   Zoster (Shingles) Vaccine (1 of 2) 02/21/2023*   Pneumonia Vaccine (2 of 2 - PCV) 11/21/2023*   Cologuard (Stool DNA test)  11/21/2023*   Flu Shot  01/24/2023   Hemoglobin A1C  02/07/2023   Eye exam for diabetics  03/29/2023   Complete foot exam   05/10/2023   Hepatitis C Screening  Completed   HPV Vaccine  Aged Out  *Topic was postponed. The date shown is not the original due date.    Advanced directives: Information on Advanced Care Planning can be found at [facility] Advance Health Care Directives Advance Health Care Directives ([email or web address]  Please bring a copy of your health care power of attorney and living will to the office to be added to your chart at your convenience.   Conditions/risks identified: Aim for 30 minutes of exercise or brisk walking, 6-8 glasses of water, and 5 servings of fruits and vegetables each day.   Next appointment: Follow up in one year for your annual wellness visit.   Preventive Care 68 Years and Older, Male  Preventive care refers to lifestyle choices and visits with your health care provider that can promote health and wellness. What does preventive care include? A yearly physical exam. This is also called an annual well check. Dental exams once or twice a year. Routine eye exams. Ask your health care provider how often you should have your eyes checked. Personal lifestyle choices,  including: Daily care of your teeth and gums. Regular physical activity. Eating a healthy diet. Avoiding tobacco and drug use. Limiting alcohol use. Practicing safe sex. Taking low doses of aspirin every day. Taking vitamin and mineral supplements as recommended by your health care provider. What happens during an annual well check? The services and screenings done by your health care provider during your annual well check will depend on your age, overall health, lifestyle risk factors, and family history of disease. Counseling  Your health care provider may ask you questions about your: Alcohol use. Tobacco use. Drug use. Emotional well-being. Home and relationship well-being. Sexual activity. Eating habits. History of falls. Memory and ability to understand (cognition). Work and work Astronomer. Screening  You may have the following tests or measurements: Height, weight, and BMI. Blood pressure. Lipid and cholesterol levels. These may be checked every 5 years, or more frequently if you are over 81 years old. Skin check. Lung cancer screening. You may have this screening every year starting at age 36 if you have a 30-pack-year history of smoking and currently smoke or have quit within the past 15 years. Fecal occult blood test (FOBT) of the stool. You may have this test every year starting at age 47. Flexible sigmoidoscopy or colonoscopy. You may have a sigmoidoscopy every 5 years or a colonoscopy every 10 years starting at age 75. Prostate cancer  screening. Recommendations will vary depending on your family history and other risks. Hepatitis C blood test. Hepatitis B blood test. Sexually transmitted disease (STD) testing. Diabetes screening. This is done by checking your blood sugar (glucose) after you have not eaten for a while (fasting). You may have this done every 1-3 years. Abdominal aortic aneurysm (AAA) screening. You may need this if you are a current or former  smoker. Osteoporosis. You may be screened starting at age 47 if you are at high risk. Talk with your health care provider about your test results, treatment options, and if necessary, the need for more tests. Vaccines  Your health care provider may recommend certain vaccines, such as: Influenza vaccine. This is recommended every year. Tetanus, diphtheria, and acellular pertussis (Tdap, Td) vaccine. You may need a Td booster every 10 years. Zoster vaccine. You may need this after age 66. Pneumococcal 13-valent conjugate (PCV13) vaccine. One dose is recommended after age 64. Pneumococcal polysaccharide (PPSV23) vaccine. One dose is recommended after age 31. Talk to your health care provider about which screenings and vaccines you need and how often you need them. This information is not intended to replace advice given to you by your health care provider. Make sure you discuss any questions you have with your health care provider. Document Released: 07/08/2015 Document Revised: 02/29/2016 Document Reviewed: 04/12/2015 Elsevier Interactive Patient Education  2017 [employer].  Fall Prevention in the Home Falls can cause injuries. They can happen to people of all ages. There are many things you can do to make your home safe and to help prevent falls. What can I do on the outside of my home? Regularly fix the edges of walkways and driveways and fix any cracks. Remove anything that might make you trip as you walk through a door, such as a raised step or threshold. Trim any bushes or trees on the path to your home. Use bright outdoor lighting. Clear any walking paths of anything that might make someone trip, such as rocks or tools. Regularly check to see if handrails are loose or broken. Make sure that both sides of any steps have handrails. Any raised decks and porches should have guardrails on the edges. Have any leaves, snow, or ice cleared regularly. Use sand or salt on walking paths during  winter. Clean up any spills in your garage right away. This includes oil or grease spills. What can I do in the bathroom? Use night lights. Install grab bars by the toilet and in the tub and shower. Do not use towel bars as grab bars. Use non-skid mats or decals in the tub or shower. If you need to sit down in the shower, use a plastic, non-slip stool. Keep the floor dry. Clean up any water that spills on the floor as soon as it happens. Remove soap buildup in the tub or shower regularly. Attach bath mats securely with double-sided non-slip rug tape. Do not have throw rugs and other things on the floor that can make you trip. What can I do in the bedroom? Use night lights. Make sure that you have a light by your bed that is easy to reach. Do not use any sheets or blankets that are too big for your bed. They should not hang down onto the floor. Have a firm chair that has side arms. You can use this for support while you get dressed. Do not have throw rugs and other things on the floor that can make you trip. What can  I do in the kitchen? Clean up any spills right away. Avoid walking on wet floors. Keep items that you use a lot in easy-to-reach places. If you need to reach something above you, use a strong step stool that has a grab bar. Keep electrical cords out of the way. Do not use floor polish or wax that makes floors slippery. If you must use wax, use non-skid floor wax. Do not have throw rugs and other things on the floor that can make you trip. What can I do with my stairs? Do not leave any items on the stairs. Make sure that there are handrails on both sides of the stairs and use them. Fix handrails that are broken or loose. Make sure that handrails are as long as the stairways. Check any carpeting to make sure that it is firmly attached to the stairs. Fix any carpet that is loose or worn. Avoid having throw rugs at the top or bottom of the stairs. If you do have throw rugs,  attach them to the floor with carpet tape. Make sure that you have a light switch at the top of the stairs and the bottom of the stairs. If you do not have them, ask someone to add them for you. What else can I do to help prevent falls? Wear shoes that: Do not have high heels. Have rubber bottoms. Are comfortable and fit you well. Are closed at the toe. Do not wear sandals. If you use a stepladder: Make sure that it is fully opened. Do not climb a closed stepladder. Make sure that both sides of the stepladder are locked into place. Ask someone to hold it for you, if possible. Clearly mark and make sure that you can see: Any grab bars or handrails. First and last steps. Where the edge of each step is. Use tools that help you move around (mobility aids) if they are needed. These include: Canes. Walkers. Scooters. Crutches. Turn on the lights when you go into a dark area. Replace any light bulbs as soon as they burn out. Set up your furniture so you have a clear path. Avoid moving your furniture around. If any of your floors are uneven, fix them. If there are any pets around you, be aware of where they are. Review your medicines with your doctor. Some medicines can make you feel dizzy. This can increase your chance of falling. Ask your doctor what other things that you can do to help prevent falls. This information is not intended to replace advice given to you by your health care provider. Make sure you discuss any questions you have with your health care provider. Document Released: 04/07/2009 Document Revised: 11/17/2015 Document Reviewed: 07/16/2014 Elsevier Interactive Patient Education  2017 [employer].

## 2022-12-13 ENCOUNTER — Encounter: Payer: Self-pay | Admitting: Family Medicine

## 2022-12-13 ENCOUNTER — Other Ambulatory Visit: Payer: Self-pay

## 2022-12-13 ENCOUNTER — Other Ambulatory Visit (HOSPITAL_COMMUNITY): Payer: Self-pay

## 2022-12-13 ENCOUNTER — Ambulatory Visit: Payer: Medicare Other | Attending: Family Medicine | Admitting: Family Medicine

## 2022-12-13 VITALS — BP 109/68 | HR 60 | Temp 98.0°F | Ht 68.0 in | Wt 182.8 lb

## 2022-12-13 DIAGNOSIS — E1149 Type 2 diabetes mellitus with other diabetic neurological complication: Secondary | ICD-10-CM

## 2022-12-13 DIAGNOSIS — R42 Dizziness and giddiness: Secondary | ICD-10-CM | POA: Diagnosis not present

## 2022-12-13 DIAGNOSIS — Z794 Long term (current) use of insulin: Secondary | ICD-10-CM

## 2022-12-13 DIAGNOSIS — Z7984 Long term (current) use of oral hypoglycemic drugs: Secondary | ICD-10-CM | POA: Insufficient documentation

## 2022-12-13 DIAGNOSIS — I10 Essential (primary) hypertension: Secondary | ICD-10-CM | POA: Diagnosis not present

## 2022-12-13 DIAGNOSIS — E1140 Type 2 diabetes mellitus with diabetic neuropathy, unspecified: Secondary | ICD-10-CM | POA: Insufficient documentation

## 2022-12-13 DIAGNOSIS — E1159 Type 2 diabetes mellitus with other circulatory complications: Secondary | ICD-10-CM | POA: Diagnosis not present

## 2022-12-13 DIAGNOSIS — I152 Hypertension secondary to endocrine disorders: Secondary | ICD-10-CM

## 2022-12-13 DIAGNOSIS — Z79899 Other long term (current) drug therapy: Secondary | ICD-10-CM | POA: Diagnosis not present

## 2022-12-13 DIAGNOSIS — E1169 Type 2 diabetes mellitus with other specified complication: Secondary | ICD-10-CM | POA: Diagnosis not present

## 2022-12-13 LAB — POCT GLYCOSYLATED HEMOGLOBIN (HGB A1C): HbA1c, POC (controlled diabetic range): 5.6 % (ref 0.0–7.0)

## 2022-12-13 MED ORDER — DULOXETINE HCL 60 MG PO CPEP
60.0000 mg | ORAL_CAPSULE | Freq: Every day | ORAL | 1 refills | Status: DC
  Filled 2022-12-13: qty 90, 90d supply, fill #0

## 2022-12-13 MED ORDER — LISINOPRIL 20 MG PO TABS
20.0000 mg | ORAL_TABLET | Freq: Every day | ORAL | 1 refills | Status: DC
  Filled 2022-12-13 – 2023-01-30 (×2): qty 90, 90d supply, fill #0
  Filled 2023-04-29: qty 90, 90d supply, fill #1

## 2022-12-13 MED ORDER — MECLIZINE HCL 25 MG PO TABS
25.0000 mg | ORAL_TABLET | Freq: Three times a day (TID) | ORAL | 0 refills | Status: DC | PRN
  Filled 2022-12-13: qty 90, 30d supply, fill #0

## 2022-12-13 MED ORDER — TRESIBA FLEXTOUCH 100 UNIT/ML ~~LOC~~ SOPN
12.0000 [IU] | PEN_INJECTOR | Freq: Every day | SUBCUTANEOUS | 3 refills | Status: DC
  Filled 2022-12-13 – 2023-01-01 (×2): qty 9, 75d supply, fill #0
  Filled 2023-03-13: qty 9, 75d supply, fill #1
  Filled 2023-05-30: qty 9, 75d supply, fill #2

## 2022-12-13 NOTE — Progress Notes (Signed)
Dizzy spells. 

## 2022-12-13 NOTE — Progress Notes (Signed)
Subjective:  Patient ID: Frederick Moody, male    DOB: 1955/07/30  Age: 67 y.o. MRN: [id number]  CC: Diabetes   HPI Frederick Moody is a 67 y.o. year old male with a history of type 2 diabetes mellitus (A1c 5.6), hypertension here for a follow-up visit.  Accompanied by daughter to today's visit. Interval History:  2 weeks ago he had experienced dizzy spells while walking and he had to stand still. He also has a history of vertigo and is on Meclizine as needed.  On both occurrences he did not check his blood pressure or blood sugar.  He states the room was not spinning when he experienced the dizziness. Lowest sugar has been 86; he denies presence of hypoglycemia.  He has neuropathy in is feet and has been on Gabapentin.  Neuropathy has been present since he was hospitalized in 2020.  When he stands from a sitting position, he has to watch his feet to ensure he has a firm grip on the floor. He endorses adherence with his antihypertensive and his blood pressure is 109/68. Past Medical History:  Diagnosis Date   Diabetes mellitus without complication (HCC)    ETOH abuse     Past Surgical History:  Procedure Laterality Date   APPENDECTOMY     IR THORACENTESIS ASP PLEURAL SPACE W/IMG GUIDE  02/06/2019   IRRIGATION AND DEBRIDEMENT ABSCESS N/A 01/13/2019   Procedure: IRRIGATION AND DEBRIDEMENT OF BACK ABSCESS;  Surgeon: Manus Rudd, MD;  Location: [facility] OR;  Service: General;  Laterality: N/A;    No family history on file.  Social History   Socioeconomic History   Marital status: Single    Spouse name: Not on file   Number of children: Not on file   Years of education: Not on file   Highest education level: Not on file  Occupational History   Not on file  Tobacco Use   Smoking status: Never   Smokeless tobacco: Never  Substance and Sexual Activity   Alcohol use: Not Currently   Drug use: Never   Sexual activity: Not on file  Other Topics Concern   Not on file  Social  History Narrative   Not on file   Social Determinants of Health   Financial Resource Strain: Low Risk  (11/21/2022)   Overall Financial Resource Strain (CARDIA)    Difficulty of Paying Living Expenses: Not hard at all  Food Insecurity: No Food Insecurity (11/21/2022)   Hunger Vital Sign    Worried About Running Out of Food in the Last Year: Never true    Ran Out of Food in the Last Year: Never true  Transportation Needs: No Transportation Needs (11/21/2022)   PRAPARE - Administrator, Civil Service (Medical): No    Lack of Transportation (Non-Medical): No  Physical Activity: Inactive (11/21/2022)   Exercise Vital Sign    Days of Exercise per Week: 0 days    Minutes of Exercise per Session: 0 min  Stress: No Stress Concern Present (11/21/2022)   [employer] of Occupational Health - Occupational Stress Questionnaire    Feeling of Stress : Not at all  Social Connections: Moderately Integrated (11/21/2022)   Social Connection and Isolation Panel [NHANES]    Frequency of Communication with Friends and Family: More than three times a week    Frequency of Social Gatherings with Friends and Family: Three times a week    Attends Religious Services: More than 4 times per year    Active Member of  Clubs or Organizations: Yes    Attends Banker Meetings: 1 to 4 times per year    Marital Status: Divorced    Allergies  Allergen Reactions   Penicillins Swelling and Rash    Neck became swollen Did it involve swelling of the face/tongue/throat, SOB, or low BP? Yes Did it involve sudden or severe rash/hives, skin peeling, or any reaction on the inside of your mouth or nose? Unk Did you need to seek medical attention at a hospital or doctor's office? Unk When did it last happen? Happened in the 1980's If all above answers are "NO", may proceed with cephalosporin use.     Outpatient Medications Prior to Visit  Medication Sig Dispense Refill   Accu-Chek Softclix  Lancets lancets Use as instructed three times daily before meals DX E11.49 100 each 12   amLODipine (NORVASC) 10 MG tablet Take 1 tablet (10 mg total) by mouth daily. 90 tablet 1   atorvastatin (LIPITOR) 40 MG tablet Take 1 tablet (40 mg total) by mouth daily. 90 tablet 1   blood glucose meter kit and supplies Please check your blood sugars twice daily. Once in the morning and once before bed. 1 each 0   Blood Glucose Monitoring Suppl (ACCU-CHEK GUIDE ME) w/Device KIT USE THREE TIMES DAILY BEFORE MEALS DX E11.49 1 kit 0   carvedilol (COREG) 6.25 MG tablet Take 1 tablet (6.25 mg total) by mouth 2 (two) times daily with a meal. 180 tablet 1   gabapentin (NEURONTIN) 300 MG capsule Take 2 capsules (600 mg total) by mouth 2 (two) times daily. 360 capsule 1   glucose blood (ACCU-CHEK GUIDE) test strip Use to check blood sugar three times daily. Dx E11.69 100 strip 12   Insulin Pen Needle (BD PEN NEEDLE NANO U/F) 32G X 4 MM MISC Use as directed. 100 each 6   metFORMIN (GLUCOPHAGE) 500 MG tablet Take 2 tablets (1,000 mg total) by mouth 2 (two) times daily with a meal. 360 tablet 1   insulin degludec (TRESIBA FLEXTOUCH) 100 UNIT/ML FlexTouch Pen Inject 15 Units into the skin daily. 9 mL 3   lisinopril (ZESTRIL) 20 MG tablet Take 1.5 tablets (30 mg total) by mouth daily. 135 tablet 1   meclizine (ANTIVERT) 25 MG tablet Take 1 tablet (25 mg total) by mouth 3 (three) times daily as needed for dizziness. 90 tablet 0   No facility-administered medications prior to visit.     ROS Review of Systems  Constitutional:  Negative for activity change and appetite change.  HENT:  Negative for sinus pressure and sore throat.   Respiratory:  Negative for chest tightness, shortness of breath and wheezing.   Cardiovascular:  Negative for chest pain and palpitations.  Gastrointestinal:  Negative for abdominal distention, abdominal pain and constipation.  Genitourinary: Negative.   Musculoskeletal: Negative.    Neurological:  Positive for dizziness and numbness.  Psychiatric/Behavioral:  Negative for behavioral problems and dysphoric mood.     Objective:  BP 109/68   Pulse 60   Temp 98 F (36.7 C) (Oral)   Ht 5\' 8"  (1.727 m)   Wt 182 lb 12.8 oz (82.9 kg)   SpO2 97%   BMI 27.79 kg/m      12/13/2022   11:14 AM 11/21/2022   10:50 AM 08/09/2022   10:32 AM  BP/Weight  Systolic BP 109  132  Diastolic BP 68  83  Wt. (Lbs) 182.8 187 187.6  BMI 27.79 kg/m2 28.02 kg/m2 28.11 kg/m2  Physical Exam Constitutional:      Appearance: He is well-developed.  Cardiovascular:     Rate and Rhythm: Normal rate.     Heart sounds: Normal heart sounds. No murmur heard. Pulmonary:     Effort: Pulmonary effort is normal.     Breath sounds: Normal breath sounds. No wheezing or rales.  Chest:     Chest wall: No tenderness.  Abdominal:     General: Bowel sounds are normal. There is no distension.     Palpations: Abdomen is soft. There is no mass.     Tenderness: There is no abdominal tenderness.  Musculoskeletal:        General: Normal range of motion.     Right lower leg: No edema.     Left lower leg: No edema.  Neurological:     Mental Status: He is alert and oriented to person, place, and time.  Psychiatric:        Mood and Affect: Mood normal.        Latest Ref Rng & Units 08/09/2022   11:01 AM 04/19/2022    2:15 PM 10/16/2021   11:37 AM  CMP  Glucose 70 - 99 mg/dL 89  89  161   BUN 8 - 27 mg/dL 13  12  16    Creatinine 0.76 - 1.27 mg/dL 0.96  0.45  4.09   Sodium 134 - 144 mmol/L 132  131  129   Potassium 3.5 - 5.2 mmol/L 5.0  5.5  4.8   Chloride 96 - 106 mmol/L 94  92  91   CO2 20 - 29 mmol/L 19  22  23    Calcium 8.6 - 10.2 mg/dL 9.9  81.1  9.8   Total Protein 6.0 - 8.5 g/dL 7.1  7.3  6.9   Total Bilirubin 0.0 - 1.2 mg/dL 0.6  0.6  0.5   Alkaline Phos 44 - 121 IU/L 52  53  44   AST 0 - 40 IU/L 16  17  16    ALT 0 - 44 IU/L 10  12  10      Lipid Panel     Component Value  Date/Time   CHOL 122 08/09/2022 1101   TRIG 68 08/09/2022 1101   HDL 52 08/09/2022 1101   CHOLHDL 2.3 08/09/2022 1101   LDLCALC 56 08/09/2022 1101    CBC    Component Value Date/Time   WBC 9.9 04/19/2022 1415   WBC 5.5 02/05/2019 0346   RBC 4.07 (L) 04/19/2022 1415   RBC 2.77 (L) 02/05/2019 0346   HGB 13.6 04/19/2022 1415   HCT 39.1 04/19/2022 1415   PLT 247 04/19/2022 1415   MCV 96 04/19/2022 1415   MCH 33.4 (H) 04/19/2022 1415   MCH 31.8 02/05/2019 0346   MCHC 34.8 04/19/2022 1415   MCHC 31.8 02/05/2019 0346   RDW 13.3 04/19/2022 1415   LYMPHSABS 2.6 04/19/2022 1415   MONOABS 0.4 02/05/2019 0346   EOSABS 0.5 (H) 04/19/2022 1415   BASOSABS 0.1 04/19/2022 1415    Lab Results  Component Value Date   HGBA1C 5.6 12/13/2022    Assessment & Plan:  1. Type 2 diabetes mellitus with other specified complication, with long-term current use of insulin (HCC) Controlled with A1c of 5.6 Due to his dizziness I have decreased his Tresiba from 15 units to 12 units Counseled on Diabetic diet, my plate method, 914 minutes of moderate intensity exercise/week Blood sugar logs with fasting goals of 80-120 mg/dl, random of less than  180 and in the event of sugars less than 60 mg/dl or greater than 130 mg/dl encouraged to notify the clinic. Advised on the need for annual eye exams, annual foot exams, Pneumonia vaccine. - POCT glycosylated hemoglobin (Hb A1C) - insulin degludec (TRESIBA FLEXTOUCH) 100 UNIT/ML FlexTouch Pen; Inject 12 Units into the skin daily.  Dispense: 9 mL; Refill: 3 - Basic Metabolic Panel  2. Hypertension associated with diabetes (HCC) Soft blood pressure Decrease lisinopril from 30 mg to 20 mg Continue amlodipine 10 mg Counseled on blood pressure goal of less than 130/80, low-sodium, DASH diet, medication compliance, 150 minutes of moderate intensity exercise per week. Discussed medication compliance, adverse effects. - lisinopril (ZESTRIL) 20 MG tablet; Take 1  tablet (20 mg total) by mouth daily.  Dispense: 90 tablet; Refill: 1  3. Type 2 diabetes mellitus with other neurologic complication, with long-term current use of insulin (HCC) Neuropathy is uncontrolled on gabapentin Will add on Cymbalta - insulin degludec (TRESIBA FLEXTOUCH) 100 UNIT/ML FlexTouch Pen; Inject 12 Units into the skin daily.  Dispense: 9 mL; Refill: 3 - DULoxetine (CYMBALTA) 60 MG capsule; Take 1 capsule (60 mg total) by mouth daily. For neuropathy  Dispense: 90 capsule; Refill: 1  4. Vertigo - meclizine (ANTIVERT) 25 MG tablet; Take 1 tablet (25 mg total) by mouth 3 (three) times daily as needed for dizziness.  Dispense: 90 tablet; Refill: 0  5. Dizziness This is multifactorial including soft blood pressure, concern for possible hypoglycemia in the setting of underlying vertigo I have adjusted his regimen and will also evaluate for anemia with CBC. If symptoms persist he has been advised to contact me and I will proceed with workup to exclude cardiac and other causes. - CBC with Differential/Platelet    Meds ordered this encounter  Medications   lisinopril (ZESTRIL) 20 MG tablet    Sig: Take 1 tablet (20 mg total) by mouth daily.    Dispense:  90 tablet    Refill:  1    Dose decrease   insulin degludec (TRESIBA FLEXTOUCH) 100 UNIT/ML FlexTouch Pen    Sig: Inject 12 Units into the skin daily.    Dispense:  9 mL    Refill:  3    Dose decrease   DULoxetine (CYMBALTA) 60 MG capsule    Sig: Take 1 capsule (60 mg total) by mouth daily. For neuropathy    Dispense:  90 capsule    Refill:  1   meclizine (ANTIVERT) 25 MG tablet    Sig: Take 1 tablet (25 mg total) by mouth 3 (three) times daily as needed for dizziness.    Dispense:  90 tablet    Refill:  0    Follow-up: Return in about 6 months (around 06/14/2023) for Chronic medical conditions.       Hoy Register, MD, FAAFP. [facility], [place] [phone]    12/13/2022, 12:08 PM

## 2022-12-13 NOTE — Patient Instructions (Signed)
Dizziness Dizziness is a common problem. It is a feeling of unsteadiness or light-headedness. You may feel like you are about to faint. Dizziness can lead to injury if you stumble or fall. Anyone can become dizzy, but dizziness is more common in older adults. This condition can be caused by a number of things, including medicines, dehydration, or illness. Follow these instructions at home: Eating and drinking  Drink enough fluid to keep your urine pale yellow. This helps to keep you from becoming dehydrated. Try to drink more clear fluids, such as water. Do not drink alcohol. Limit your caffeine intake if told to do so by your health care provider. Check ingredients and nutrition facts to see if a food or beverage contains caffeine. Limit your salt (sodium) intake if told to do so by your health care provider. Check ingredients and nutrition facts to see if a food or beverage contains sodium. Activity  Avoid making quick movements. Rise slowly from chairs and steady yourself until you feel okay. In the morning, first sit up on the side of the bed. When you feel okay, stand slowly while you hold onto something until you know that your balance is good. If you need to stand in one place for a long time, move your legs often. Tighten and relax the muscles in your legs while you are standing. Do not drive or use machinery if you feel dizzy. Avoid bending down if you feel dizzy. Place items in your home so that they are easy for you to reach without leaning over. Lifestyle Do not use any products that contain nicotine or tobacco. These products include cigarettes, chewing tobacco, and vaping devices, such as e-cigarettes. If you need help quitting, ask your health care provider. Try to reduce your stress level by using methods such as yoga or meditation. Talk with your health care provider if you need help to manage your stress. General instructions Watch your dizziness for any changes. Take  over-the-counter and prescription medicines only as told by your health care provider. Talk with your health care provider if you think that your dizziness is caused by a medicine that you are taking. Tell a friend or a family member that you are feeling dizzy. If he or she notices any changes in your behavior, have this person call your health care provider. Keep all follow-up visits. This is important. Contact a health care provider if: Your dizziness does not go away or you have new symptoms. Your dizziness or light-headedness gets worse. You feel nauseous. You have reduced hearing. You have a fever. You have neck pain or a stiff neck. Your dizziness leads to an injury or a fall. Get help right away if: You vomit or have diarrhea and are unable to eat or drink anything. You have problems talking, walking, swallowing, or using your arms, hands, or legs. You feel generally weak. You have any bleeding. You are not thinking clearly or you have trouble forming sentences. It may take a friend or family member to notice this. You have chest pain, abdominal pain, shortness of breath, or sweating. Your vision changes or you develop a severe headache. These symptoms may represent a serious problem that is an emergency. Do not wait to see if the symptoms will go away. Get medical help right away. Call your local emergency services (911 in the [place] Do not drive yourself to the hospital. Summary Dizziness is a feeling of unsteadiness or light-headedness. This condition can be caused by a number of   things, including medicines, dehydration, or illness. Anyone can become dizzy, but dizziness is more common in older adults. Drink enough fluid to keep your urine pale yellow. Do not drink alcohol. Avoid making quick movements if you feel dizzy. Monitor your dizziness for any changes. This information is not intended to replace advice given to you by your health care provider. Make sure you discuss any  questions you have with your health care provider. Document Revised: 05/16/2020 Document Reviewed: 05/16/2020 Elsevier Patient Education  2023 Elsevier Inc.  

## 2022-12-14 ENCOUNTER — Other Ambulatory Visit: Payer: Self-pay

## 2022-12-14 ENCOUNTER — Other Ambulatory Visit (HOSPITAL_COMMUNITY): Payer: Self-pay

## 2022-12-14 ENCOUNTER — Other Ambulatory Visit: Payer: Self-pay | Admitting: Family Medicine

## 2022-12-14 DIAGNOSIS — E875 Hyperkalemia: Secondary | ICD-10-CM

## 2022-12-14 LAB — CBC WITH DIFFERENTIAL/PLATELET
Basophils Absolute: 0.1 10*3/uL (ref 0.0–0.2)
Basos: 1 %
EOS (ABSOLUTE): 0.5 10*3/uL — ABNORMAL HIGH (ref 0.0–0.4)
Eos: 6 %
Hematocrit: 39.4 % (ref 37.5–51.0)
Hemoglobin: 12.9 g/dL — ABNORMAL LOW (ref 13.0–17.7)
Immature Grans (Abs): 0 10*3/uL (ref 0.0–0.1)
Immature Granulocytes: 1 %
Lymphocytes Absolute: 2.1 10*3/uL (ref 0.7–3.1)
Lymphs: 24 %
MCH: 33.7 pg — ABNORMAL HIGH (ref 26.6–33.0)
MCHC: 32.7 g/dL (ref 31.5–35.7)
MCV: 103 fL — ABNORMAL HIGH (ref 79–97)
Monocytes Absolute: 0.6 10*3/uL (ref 0.1–0.9)
Monocytes: 7 %
Neutrophils Absolute: 5.5 10*3/uL (ref 1.4–7.0)
Neutrophils: 61 %
Platelets: 237 10*3/uL (ref 150–450)
RBC: 3.83 x10E6/uL — ABNORMAL LOW (ref 4.14–5.80)
RDW: 13.1 % (ref 11.6–15.4)
WBC: 8.8 10*3/uL (ref 3.4–10.8)

## 2022-12-14 LAB — BASIC METABOLIC PANEL
BUN/Creatinine Ratio: 17 (ref 10–24)
BUN: 17 mg/dL (ref 8–27)
CO2: 23 mmol/L (ref 20–29)
Calcium: 10 mg/dL (ref 8.6–10.2)
Chloride: 98 mmol/L (ref 96–106)
Creatinine, Ser: 1.01 mg/dL (ref 0.76–1.27)
Glucose: 82 mg/dL (ref 70–99)
Potassium: 6 mmol/L — ABNORMAL HIGH (ref 3.5–5.2)
Sodium: 134 mmol/L (ref 134–144)
eGFR: 82 mL/min/{1.73_m2} (ref >59)

## 2022-12-14 MED ORDER — SODIUM POLYSTYRENE SULFONATE 15 GM/60ML PO SUSP
15.0000 g | Freq: Once | ORAL | 0 refills | Status: AC
  Filled 2022-12-14 (×2): qty 60, 1d supply, fill #0

## 2022-12-19 ENCOUNTER — Telehealth: Payer: Self-pay | Admitting: Family Medicine

## 2022-12-19 ENCOUNTER — Other Ambulatory Visit (HOSPITAL_COMMUNITY): Payer: Self-pay

## 2022-12-19 ENCOUNTER — Other Ambulatory Visit: Payer: Self-pay | Admitting: Internal Medicine

## 2022-12-19 MED ORDER — SODIUM POLYSTYRENE SULFONATE 15 GM/60ML PO SUSP
15.0000 g | Freq: Once | ORAL | 0 refills | Status: AC
  Filled 2022-12-19: qty 60, 1d supply, fill #0

## 2022-12-19 NOTE — Telephone Encounter (Signed)
Patient aware.

## 2022-12-19 NOTE — Telephone Encounter (Signed)
Pt's daughter is calling in because she received a call from Cassandra regarding some medication that was sent to the pharmacy for the pt. Hollie says that pt says he has the medication already, but [facility] doesn't see it on the medication list and wants to know if it is something that needs to be picked up and if so what is it. Please follow up with [facility] [phone]

## 2022-12-20 ENCOUNTER — Other Ambulatory Visit (HOSPITAL_COMMUNITY): Payer: Self-pay

## 2023-01-02 ENCOUNTER — Other Ambulatory Visit (HOSPITAL_COMMUNITY): Payer: Self-pay

## 2023-01-02 ENCOUNTER — Other Ambulatory Visit: Payer: Self-pay

## 2023-01-03 ENCOUNTER — Other Ambulatory Visit: Payer: Self-pay

## 2023-01-30 ENCOUNTER — Other Ambulatory Visit (HOSPITAL_COMMUNITY): Payer: Self-pay

## 2023-01-30 ENCOUNTER — Other Ambulatory Visit: Payer: Self-pay | Admitting: Family Medicine

## 2023-01-30 DIAGNOSIS — R42 Dizziness and giddiness: Secondary | ICD-10-CM

## 2023-01-31 ENCOUNTER — Other Ambulatory Visit (HOSPITAL_COMMUNITY): Payer: Self-pay

## 2023-01-31 ENCOUNTER — Other Ambulatory Visit: Payer: Self-pay

## 2023-01-31 MED ORDER — MECLIZINE HCL 25 MG PO TABS
25.0000 mg | ORAL_TABLET | Freq: Three times a day (TID) | ORAL | 0 refills | Status: DC | PRN
Start: 2023-01-31 — End: 2023-04-29
  Filled 2023-01-31: qty 90, 30d supply, fill #0

## 2023-02-13 ENCOUNTER — Encounter: Payer: Self-pay | Admitting: Podiatry

## 2023-02-13 ENCOUNTER — Ambulatory Visit (INDEPENDENT_AMBULATORY_CARE_PROVIDER_SITE_OTHER): Payer: Medicare Other | Admitting: Podiatry

## 2023-02-13 DIAGNOSIS — M79675 Pain in left toe(s): Secondary | ICD-10-CM

## 2023-02-13 DIAGNOSIS — M79674 Pain in right toe(s): Secondary | ICD-10-CM

## 2023-02-13 DIAGNOSIS — B351 Tinea unguium: Secondary | ICD-10-CM

## 2023-02-13 NOTE — Progress Notes (Signed)
This patient presents to the office with chief complaint of long thick painful nails.  Patient says the nails are painful walking and wearing shoes.  This patient is unable to self treat.  This patient is unable to trim his nails since she is unable to reach his nails.  She presents to the office for preventative foot care services. Patient is diabetic.  General Appearance  Alert, conversant and in no acute stress.  Vascular  Dorsalis pedis and posterior tibial  pulses are palpable  bilaterally.  Capillary return is within normal limits  bilaterally.   Neurologic  Senn-Weinstein monofilament wire test within normal limits  bilaterally. Muscle power within normal limits bilaterally.  Nails Thick disfigured discolored nails with subungual debris  from hallux to fifth toes bilaterally. No evidence of bacterial infection or drainage bilaterally.  Orthopedic  No limitations of motion  feet .  No crepitus or effusions noted.  No bony pathology or digital deformities noted.  DJD 1st MPJ right foot.  Skin  normotropic skin with no porokeratosis noted bilaterally.  No signs of infections or ulcers noted.     Onychomycosis  Nails  B/L.  Pain in right toes  Pain in left toes  Debridement of nails both feet followed trimming the nails with dremel tool.    RTC 3 months.   Helane Gunther DPM

## 2023-03-14 ENCOUNTER — Other Ambulatory Visit (HOSPITAL_COMMUNITY): Payer: Self-pay

## 2023-04-02 LAB — HM DIABETES EYE EXAM

## 2023-04-29 ENCOUNTER — Other Ambulatory Visit: Payer: Self-pay

## 2023-04-29 ENCOUNTER — Other Ambulatory Visit: Payer: Self-pay | Admitting: Family Medicine

## 2023-04-29 ENCOUNTER — Other Ambulatory Visit: Payer: Self-pay | Admitting: Physician Assistant

## 2023-04-29 DIAGNOSIS — E1169 Type 2 diabetes mellitus with other specified complication: Secondary | ICD-10-CM

## 2023-04-29 DIAGNOSIS — I152 Hypertension secondary to endocrine disorders: Secondary | ICD-10-CM

## 2023-04-29 DIAGNOSIS — R42 Dizziness and giddiness: Secondary | ICD-10-CM

## 2023-04-29 MED ORDER — MECLIZINE HCL 25 MG PO TABS
25.0000 mg | ORAL_TABLET | Freq: Three times a day (TID) | ORAL | 0 refills | Status: DC | PRN
Start: 2023-04-29 — End: 2023-07-31
  Filled 2023-04-29: qty 90, 30d supply, fill #0

## 2023-04-29 MED ORDER — BD PEN NEEDLE NANO 2ND GEN 32G X 4 MM MISC
1.0000 [IU] | Freq: Every day | 0 refills | Status: DC
Start: 2023-04-29 — End: 2023-05-29
  Filled 2023-04-29: qty 100, 90d supply, fill #0

## 2023-04-29 MED ORDER — CARVEDILOL 6.25 MG PO TABS
6.2500 mg | ORAL_TABLET | Freq: Two times a day (BID) | ORAL | 0 refills | Status: DC
Start: 2023-04-29 — End: 2023-07-31
  Filled 2023-04-29: qty 180, 90d supply, fill #0

## 2023-04-29 MED ORDER — ATORVASTATIN CALCIUM 40 MG PO TABS
40.0000 mg | ORAL_TABLET | Freq: Every day | ORAL | 0 refills | Status: DC
Start: 2023-04-29 — End: 2023-07-31
  Filled 2023-04-29: qty 90, 90d supply, fill #0

## 2023-04-29 MED ORDER — METFORMIN HCL 500 MG PO TABS
1000.0000 mg | ORAL_TABLET | Freq: Two times a day (BID) | ORAL | 0 refills | Status: DC
Start: 2023-04-29 — End: 2023-07-31
  Filled 2023-04-29: qty 360, 90d supply, fill #0

## 2023-04-29 MED ORDER — AMLODIPINE BESYLATE 10 MG PO TABS
10.0000 mg | ORAL_TABLET | Freq: Every day | ORAL | 0 refills | Status: DC
  Filled 2023-04-29: qty 90, 90d supply, fill #0

## 2023-04-30 ENCOUNTER — Other Ambulatory Visit: Payer: Self-pay

## 2023-04-30 ENCOUNTER — Other Ambulatory Visit (HOSPITAL_COMMUNITY): Payer: Self-pay

## 2023-04-30 MED ORDER — GABAPENTIN 300 MG PO CAPS
600.0000 mg | ORAL_CAPSULE | Freq: Two times a day (BID) | ORAL | 1 refills | Status: DC
Start: 2023-04-30 — End: 2023-07-31
  Filled 2023-04-30: qty 360, 90d supply, fill #0

## 2023-05-16 ENCOUNTER — Ambulatory Visit: Payer: Medicare Other | Admitting: Podiatry

## 2023-05-27 ENCOUNTER — Encounter: Payer: Self-pay | Admitting: Podiatry

## 2023-05-27 ENCOUNTER — Ambulatory Visit (INDEPENDENT_AMBULATORY_CARE_PROVIDER_SITE_OTHER): Payer: Medicare Other | Admitting: Podiatry

## 2023-05-27 DIAGNOSIS — M79674 Pain in right toe(s): Secondary | ICD-10-CM

## 2023-05-27 DIAGNOSIS — M79675 Pain in left toe(s): Secondary | ICD-10-CM | POA: Diagnosis not present

## 2023-05-27 DIAGNOSIS — B351 Tinea unguium: Secondary | ICD-10-CM

## 2023-05-27 NOTE — Progress Notes (Signed)
This patient presents to the office with chief complaint of long thick painful nails.  Patient says the nails are painful walking and wearing shoes.  This patient is unable to self treat.  This patient is unable to trim his nails since she is unable to reach his nails.  She presents to the office for preventative foot care services. Patient is diabetic.  General Appearance  Alert, conversant and in no acute stress.  Vascular  Dorsalis pedis and posterior tibial  pulses are palpable  bilaterally.  Capillary return is within normal limits  bilaterally.   Neurologic  Senn-Weinstein monofilament wire test within normal limits  bilaterally. Muscle power within normal limits bilaterally.  Nails Thick disfigured discolored nails with subungual debris  from hallux to fifth toes bilaterally. No evidence of bacterial infection or drainage bilaterally.  Orthopedic  No limitations of motion  feet .  No crepitus or effusions noted.  No bony pathology or digital deformities noted.  DJD 1st MPJ right foot.  Skin  normotropic skin with no porokeratosis noted bilaterally.  No signs of infections or ulcers noted.     Onychomycosis  Nails  B/L.  Pain in right toes  Pain in left toes  Debridement of nails both feet followed trimming the nails with dremel tool.    RTC 3 months.   Helane Gunther DPM

## 2023-05-29 ENCOUNTER — Encounter: Payer: Self-pay | Admitting: Family Medicine

## 2023-05-29 ENCOUNTER — Other Ambulatory Visit: Payer: Self-pay

## 2023-05-29 ENCOUNTER — Other Ambulatory Visit: Payer: Self-pay | Admitting: Family Medicine

## 2023-05-29 DIAGNOSIS — Z794 Long term (current) use of insulin: Secondary | ICD-10-CM

## 2023-05-29 MED ORDER — BD PEN NEEDLE NANO 2ND GEN 32G X 4 MM MISC
1.0000 [IU] | Freq: Every day | 0 refills | Status: DC
  Filled 2023-05-29 – 2023-07-31 (×5): qty 100, 90d supply, fill #0

## 2023-05-29 NOTE — Telephone Encounter (Signed)
Medication Refill -  Most Recent Primary Care Visit:  Provider: Hoy Register  Department: [facility]  Visit Type: OFFICE VISIT  Date: 12/13/2022  Medication: glucose blood (ACCU-CHEK GUIDE) test strip and Insulin Pen Needle (BD PEN NEEDLE NANO 2ND GEN) 32G X 4 MM   Has the patient contacted their pharmacy? No  Is this the correct pharmacy for this prescription? Yes If no, delete pharmacy and type the correct one.  This is the patient's preferred pharmacy: [facility] [address] [place] [address] Phone: [phone] Fax: [phone]  Has the prescription been filled recently? Yes  Is the patient out of the medication? Yes  Has the patient been seen for an appointment in the last year OR does the patient have an upcoming appointment? Yes  Can we respond through MyChart? No  Agent: Please be advised that Rx refills may take up to 3 business days. We ask that you follow-up with your pharmacy.

## 2023-05-29 NOTE — Telephone Encounter (Signed)
Unable to order refill: glucose blood (ACCU-CHEK GUIDE) test strip  Requires new order- request forwarded to office.

## 2023-05-30 ENCOUNTER — Other Ambulatory Visit: Payer: Self-pay | Admitting: Family Medicine

## 2023-05-30 ENCOUNTER — Other Ambulatory Visit: Payer: Self-pay

## 2023-05-30 ENCOUNTER — Other Ambulatory Visit: Payer: Self-pay | Admitting: Pharmacist

## 2023-05-30 ENCOUNTER — Other Ambulatory Visit (HOSPITAL_COMMUNITY): Payer: Self-pay

## 2023-05-30 DIAGNOSIS — Z794 Long term (current) use of insulin: Secondary | ICD-10-CM

## 2023-05-30 MED ORDER — ACCU-CHEK SOFTCLIX LANCETS MISC: 6 refills | Status: AC

## 2023-05-30 MED ORDER — GLUCOSE BLOOD VI STRP
ORAL_STRIP | 12 refills | Status: DC
  Filled 2023-05-30 (×2): qty 100, fill #0

## 2023-05-30 MED ORDER — ACCU-CHEK SOFTCLIX LANCETS MISC
Freq: Three times a day (TID) | 12 refills | Status: DC
  Filled 2023-05-30 (×2): qty 100, fill #0

## 2023-05-30 MED ORDER — ACCU-CHEK GUIDE TEST VI STRP: ORAL_STRIP | 6 refills | Status: DC

## 2023-06-24 ENCOUNTER — Ambulatory Visit: Payer: Medicare Other | Admitting: Family Medicine

## 2023-06-27 ENCOUNTER — Ambulatory Visit: Payer: Medicare Other | Admitting: Family Medicine

## 2023-07-09 ENCOUNTER — Other Ambulatory Visit: Payer: Self-pay

## 2023-07-31 ENCOUNTER — Encounter: Payer: Self-pay | Admitting: Family Medicine

## 2023-07-31 ENCOUNTER — Other Ambulatory Visit: Payer: Self-pay

## 2023-07-31 ENCOUNTER — Other Ambulatory Visit (HOSPITAL_COMMUNITY): Payer: Self-pay

## 2023-07-31 ENCOUNTER — Ambulatory Visit: Payer: Medicare Other | Attending: Family Medicine | Admitting: Family Medicine

## 2023-07-31 ENCOUNTER — Other Ambulatory Visit: Payer: Self-pay | Admitting: Family Medicine

## 2023-07-31 VITALS — BP 122/68 | HR 72 | Ht 68.0 in | Wt 174.2 lb

## 2023-07-31 DIAGNOSIS — R200 Anesthesia of skin: Secondary | ICD-10-CM | POA: Insufficient documentation

## 2023-07-31 DIAGNOSIS — I10 Essential (primary) hypertension: Secondary | ICD-10-CM | POA: Insufficient documentation

## 2023-07-31 DIAGNOSIS — Z79899 Other long term (current) drug therapy: Secondary | ICD-10-CM | POA: Diagnosis not present

## 2023-07-31 DIAGNOSIS — E1142 Type 2 diabetes mellitus with diabetic polyneuropathy: Secondary | ICD-10-CM | POA: Diagnosis not present

## 2023-07-31 DIAGNOSIS — E875 Hyperkalemia: Secondary | ICD-10-CM | POA: Diagnosis not present

## 2023-07-31 DIAGNOSIS — E1149 Type 2 diabetes mellitus with other diabetic neurological complication: Secondary | ICD-10-CM | POA: Diagnosis not present

## 2023-07-31 DIAGNOSIS — R42 Dizziness and giddiness: Secondary | ICD-10-CM | POA: Insufficient documentation

## 2023-07-31 DIAGNOSIS — E1169 Type 2 diabetes mellitus with other specified complication: Secondary | ICD-10-CM | POA: Diagnosis present

## 2023-07-31 DIAGNOSIS — Z7984 Long term (current) use of oral hypoglycemic drugs: Secondary | ICD-10-CM | POA: Diagnosis not present

## 2023-07-31 DIAGNOSIS — Z794 Long term (current) use of insulin: Secondary | ICD-10-CM | POA: Insufficient documentation

## 2023-07-31 DIAGNOSIS — I152 Hypertension secondary to endocrine disorders: Secondary | ICD-10-CM

## 2023-07-31 DIAGNOSIS — E1159 Type 2 diabetes mellitus with other circulatory complications: Secondary | ICD-10-CM | POA: Diagnosis not present

## 2023-07-31 LAB — POCT GLYCOSYLATED HEMOGLOBIN (HGB A1C): HbA1c, POC (controlled diabetic range): 5.9 % (ref 0.0–7.0)

## 2023-07-31 MED ORDER — METFORMIN HCL 500 MG PO TABS
1000.0000 mg | ORAL_TABLET | Freq: Two times a day (BID) | ORAL | 1 refills | Status: DC
  Filled 2023-07-31 (×2): qty 360, 90d supply, fill #0
  Filled 2023-11-04: qty 360, 90d supply, fill #1

## 2023-07-31 MED ORDER — LISINOPRIL 20 MG PO TABS
20.0000 mg | ORAL_TABLET | Freq: Every day | ORAL | 1 refills | Status: DC
  Filled 2023-07-31 (×2): qty 90, 90d supply, fill #0

## 2023-07-31 MED ORDER — PREGABALIN 100 MG PO CAPS
100.0000 mg | ORAL_CAPSULE | Freq: Two times a day (BID) | ORAL | 1 refills | Status: DC
  Filled 2023-07-31 (×2): qty 180, 90d supply, fill #0

## 2023-07-31 MED ORDER — CARVEDILOL 6.25 MG PO TABS
6.2500 mg | ORAL_TABLET | Freq: Two times a day (BID) | ORAL | 1 refills | Status: DC
  Filled 2023-07-31 (×2): qty 180, 90d supply, fill #0
  Filled 2023-11-04: qty 180, 90d supply, fill #1

## 2023-07-31 MED ORDER — AMLODIPINE BESYLATE 10 MG PO TABS
10.0000 mg | ORAL_TABLET | Freq: Every day | ORAL | 1 refills | Status: DC
  Filled 2023-07-31 – 2023-08-02 (×3): qty 90, 90d supply, fill #0
  Filled 2023-11-04: qty 90, 90d supply, fill #1

## 2023-07-31 MED ORDER — TRESIBA FLEXTOUCH 100 UNIT/ML ~~LOC~~ SOPN
12.0000 [IU] | PEN_INJECTOR | Freq: Every day | SUBCUTANEOUS | 3 refills | Status: DC
  Filled 2023-07-31 (×2): qty 9, 75d supply, fill #0
  Filled 2023-11-04: qty 9, 75d supply, fill #1
  Filled 2024-01-15: qty 9, 75d supply, fill #2

## 2023-07-31 MED ORDER — ATORVASTATIN CALCIUM 40 MG PO TABS
40.0000 mg | ORAL_TABLET | Freq: Every day | ORAL | 1 refills | Status: DC
  Filled 2023-07-31 (×2): qty 90, 90d supply, fill #0
  Filled 2023-11-04: qty 90, 90d supply, fill #1

## 2023-07-31 NOTE — Patient Instructions (Signed)
 VISIT SUMMARY:  During today's visit, we discussed your ongoing dizziness, hypertension, diabetes, and peripheral neuropathy. We reviewed your current medications and made some adjustments to better manage your symptoms. We also talked about general health maintenance and lifestyle recommendations.  YOUR PLAN:  -HYPERTENSION: Hypertension, or high blood pressure, is when the force of the blood against your artery walls is too high. Your dizziness has improved since we reduced your Lisinopril  dose to 20mg . Continue taking Amlodipine  10mg , Carvedilol  6.25mg  twice daily, and Lisinopril  20mg  as prescribed.  -HYPERKALEMIA: Hyperkalemia is a condition where there is too much potassium in your blood, which can be caused by certain medications like Lisinopril . We will order repeat labs today to reassess your potassium level.  -PERIPHERAL NEUROPATHY: Peripheral neuropathy is a condition that results in numbness, tingling, and pain in your extremities. Since Gabapentin  is not effectively controlling your symptoms, we will discontinue it and start you on Pregabalin  (Lyrica ) twice daily.  -DIABETES MELLITUS: Diabetes Mellitus is a condition that affects how your body processes blood sugar. Your A1c level is 5.9, indicating good control. Continue your current regimen of Metformin  and Tresiba  12 units at night, and monitor for any symptoms of low blood sugar.  -DIZZINESS: Your dizziness has improved since reducing your Lisinopril  dose. You are currently taking Meclizine  as needed. Try discontinuing Meclizine  to see if it is still necessary.  -GENERAL HEALTH MAINTENANCE: For your general health, you declined the flu shot. We recommend you join an exercise program at the local [employer] for safe indoor walking.  INSTRUCTIONS:  Please follow up with repeat labs today to reassess your potassium level. Continue monitoring your blood sugar levels and watch for any symptoms of hypoglycemia. If you experience any new or  worsening symptoms, please contact our office.

## 2023-07-31 NOTE — Progress Notes (Signed)
 Subjective:  Patient ID: Frederick Moody, male    DOB: Oct 28, 1955  Age: 68 y.o. MRN: [id number]  CC: Medical Management of Chronic Issues   HPI Frederick Moody is a 68 y.o. year old male with a history of  type 2 diabetes mellitus (A1c 5.9), hypertension here for a follow-up visit.   Interval History: Discussed the use of AI scribe software for clinical note transcription with the patient, who gave verbal consent to proceed.  At his last visit he had complained of dizziness.  The dizziness was particularly severe when the patient was standing up, but it has been gradually improving. The patient also reports a recent cold, which caused some additional dizziness, but he has mostly recovered from it. The patient's blood pressure medication was recently adjusted with reduction in lisinopril  dose from 30 mg to 20 mg, which seems to have helped with the dizziness.  The patient also reports numbness in his feet, which he attributes to his neuropathy. The patient's current medication for neuropathy, gabapentin , does not seem to be controlling the symptoms effectively. The patient also mentions that he has been feeling a bit sleepy, which he thinks might be a side effect of the gabapentin .  Cymbalta  was added to his regimen but he was unable to tolerate it. A1c is 5.9 and he denies hypoglycemia.  Endorses adherence with his current regimen.  Last set of labs in 11/2022 revealed hyperkalemia of 6.0.  Kayexalate  was prescribed and he was supposed to return for repeat potassium level but never did.    Past Medical History:  Diagnosis Date   Diabetes mellitus without complication (HCC)    ETOH abuse     Past Surgical History:  Procedure Laterality Date   APPENDECTOMY     IR THORACENTESIS ASP PLEURAL SPACE W/IMG GUIDE  02/06/2019   IRRIGATION AND DEBRIDEMENT ABSCESS N/A 01/13/2019   Procedure: IRRIGATION AND DEBRIDEMENT OF BACK ABSCESS;  Surgeon: Belinda Cough, MD;  Location: [facility] OR;  Service:  General;  Laterality: N/A;    No family history on file.  Social History   Socioeconomic History   Marital status: Single    Spouse name: Not on file   Number of children: Not on file   Years of education: Not on file   Highest education level: Not on file  Occupational History   Not on file  Tobacco Use   Smoking status: Never   Smokeless tobacco: Never  Vaping Use   Vaping status: Never Used  Substance and Sexual Activity   Alcohol  use: Not Currently   Drug use: Yes    Types: Marijuana   Sexual activity: Not on file  Other Topics Concern   Not on file  Social History Narrative   Not on file   Social Drivers of Health   Financial Resource Strain: Low Risk  (11/21/2022)   Overall Financial Resource Strain (CARDIA)    Difficulty of Paying Living Expenses: Not hard at all  Food Insecurity: No Food Insecurity (11/21/2022)   Hunger Vital Sign    Worried About Running Out of Food in the Last Year: Never true    Ran Out of Food in the Last Year: Never true  Transportation Needs: No Transportation Needs (11/21/2022)   PRAPARE - Administrator, Civil Service (Medical): No    Lack of Transportation (Non-Medical): No  Physical Activity: Inactive (11/21/2022)   Exercise Vital Sign    Days of Exercise per Week: 0 days    Minutes of Exercise  per Session: 0 min  Stress: No Stress Concern Present (11/21/2022)   [employer] of Occupational Health - Occupational Stress Questionnaire    Feeling of Stress : Not at all  Social Connections: Moderately Integrated (11/21/2022)   Social Connection and Isolation Panel [NHANES]    Frequency of Communication with Friends and Family: More than three times a week    Frequency of Social Gatherings with Friends and Family: Three times a week    Attends Religious Services: More than 4 times per year    Active Member of Clubs or Organizations: Yes    Attends Banker Meetings: 1 to 4 times per year    Marital Status:  Divorced    Allergies  Allergen Reactions   Penicillins Swelling and Rash    Neck became swollen Did it involve swelling of the face/tongue/throat, SOB, or low BP? Yes Did it involve sudden or severe rash/hives, skin peeling, or any reaction on the inside of your mouth or nose? Unk Did you need to seek medical attention at a hospital or doctor's office? Unk When did it last happen? Happened in the 1980's If all above answers are NO, may proceed with cephalosporin use.     Outpatient Medications Prior to Visit  Medication Sig Dispense Refill   Accu-Chek Softclix Lancets lancets Use as instructed three times daily before meals. E11.69 100 each 6   blood glucose meter kit and supplies Please check your blood sugars twice daily. Once in the morning and once before bed. 1 each 0   Blood Glucose Monitoring Suppl (ACCU-CHEK GUIDE ME) w/Device KIT USE THREE TIMES DAILY BEFORE MEALS DX E11.49 1 kit 0   glucose blood (ACCU-CHEK GUIDE TEST) test strip Use to check blood sugar 3 times daily. E11.69 100 each 6   Insulin  Pen Needle (BD PEN NEEDLE NANO 2ND GEN) 32G X 4 MM MISC Use as directed. 100 each 0   meclizine  (ANTIVERT ) 25 MG tablet Take 1 tablet (25 mg total) by mouth 3 (three) times daily as needed for dizziness. 90 tablet 0   amLODipine  (NORVASC ) 10 MG tablet Take 1 tablet (10 mg total) by mouth daily. 90 tablet 0   atorvastatin  (LIPITOR) 40 MG tablet Take 1 tablet (40 mg total) by mouth daily. 90 tablet 0   carvedilol  (COREG ) 6.25 MG tablet Take 1 tablet (6.25 mg total) by mouth 2 (two) times daily with a meal. 180 tablet 0   gabapentin  (NEURONTIN ) 300 MG capsule Take 2 capsules (600 mg total) by mouth 2 (two) times daily. 360 capsule 1   insulin  degludec (TRESIBA  FLEXTOUCH) 100 UNIT/ML FlexTouch Pen Inject 12 Units into the skin daily. 9 mL 3   lisinopril  (ZESTRIL ) 20 MG tablet Take 1 tablet (20 mg total) by mouth daily. 90 tablet 1   metFORMIN  (GLUCOPHAGE ) 500 MG tablet Take 2 tablets  (1,000 mg total) by mouth 2 (two) times daily with a meal. 360 tablet 0   DULoxetine  (CYMBALTA ) 60 MG capsule Take 1 capsule (60 mg total) by mouth daily. For neuropathy (Patient not taking: Reported on 07/31/2023) 90 capsule 1   No facility-administered medications prior to visit.     ROS Review of Systems  Constitutional:  Negative for activity change and appetite change.  HENT:  Negative for sinus pressure and sore throat.   Respiratory:  Negative for chest tightness, shortness of breath and wheezing.   Cardiovascular:  Negative for chest pain and palpitations.  Gastrointestinal:  Negative for abdominal distention, abdominal pain  and constipation.  Genitourinary: Negative.   Musculoskeletal: Negative.   Neurological:  Positive for numbness.  Psychiatric/Behavioral:  Negative for behavioral problems and dysphoric mood.    Objective:  BP 122/68   Pulse 72   Ht 5' 8 (1.727 m)   Wt 174 lb 3.2 oz (79 kg)   SpO2 95%   BMI 26.49 kg/m      07/31/2023    3:39 PM 12/13/2022   11:14 AM 11/21/2022   10:50 AM  BP/Weight  Systolic BP 122 109 --  Diastolic BP 68 68 --  Wt. (Lbs) 174.2 182.8 187  BMI 26.49 kg/m2 27.79 kg/m2 28.02 kg/m2      Physical Exam Constitutional:      Appearance: He is well-developed.  HENT:     Right Ear: Tympanic membrane normal.     Left Ear: Tympanic membrane normal.     Mouth/Throat:     Mouth: Mucous membranes are moist.  Cardiovascular:     Rate and Rhythm: Normal rate.     Heart sounds: Normal heart sounds. No murmur heard. Pulmonary:     Effort: Pulmonary effort is normal.     Breath sounds: Normal breath sounds. No wheezing or rales.  Chest:     Chest wall: No tenderness.  Abdominal:     General: Bowel sounds are normal. There is no distension.     Palpations: Abdomen is soft. There is no mass.     Tenderness: There is no abdominal tenderness.  Musculoskeletal:        General: Normal range of motion.     Right lower leg: No edema.      Left lower leg: No edema.  Neurological:     Mental Status: He is alert and oriented to person, place, and time.  Psychiatric:        Mood and Affect: Mood normal.    Diabetic Foot Exam - Simple   Simple Foot Form Diabetic Foot exam was performed with the following findings: Yes 07/31/2023  4:03 PM  Visual Inspection No deformities, no ulcerations, no other skin breakdown bilaterally: Yes Sensation Testing Intact to touch and monofilament testing bilaterally: Yes Pulse Check Posterior Tibialis and Dorsalis pulse intact bilaterally: Yes Comments        Latest Ref Rng & Units 12/13/2022   12:01 PM 08/09/2022   11:01 AM 04/19/2022    2:15 PM  CMP  Glucose 70 - 99 mg/dL 82  89  89   BUN 8 - 27 mg/dL 17  13  12    Creatinine 0.76 - 1.27 mg/dL 8.98  9.26  9.18   Sodium 134 - 144 mmol/L 134  132  131   Potassium 3.5 - 5.2 mmol/L 6.0  5.0  5.5   Chloride 96 - 106 mmol/L 98  94  92   CO2 20 - 29 mmol/L 23  19  22    Calcium  8.6 - 10.2 mg/dL 89.9  9.9  89.8   Total Protein 6.0 - 8.5 g/dL  7.1  7.3   Total Bilirubin 0.0 - 1.2 mg/dL  0.6  0.6   Alkaline Phos 44 - 121 IU/L  52  53   AST 0 - 40 IU/L  16  17   ALT 0 - 44 IU/L  10  12     Lipid Panel     Component Value Date/Time   CHOL 122 08/09/2022 1101   TRIG 68 08/09/2022 1101   HDL 52 08/09/2022 1101   CHOLHDL 2.3 08/09/2022  1101   LDLCALC 56 08/09/2022 1101    CBC    Component Value Date/Time   WBC 8.8 12/13/2022 1201   WBC 5.5 02/05/2019 0346   RBC 3.83 (L) 12/13/2022 1201   RBC 2.77 (L) 02/05/2019 0346   HGB 12.9 (L) 12/13/2022 1201   HCT 39.4 12/13/2022 1201   PLT 237 12/13/2022 1201   MCV 103 (H) 12/13/2022 1201   MCH 33.7 (H) 12/13/2022 1201   MCH 31.8 02/05/2019 0346   MCHC 32.7 12/13/2022 1201   MCHC 31.8 02/05/2019 0346   RDW 13.1 12/13/2022 1201   LYMPHSABS 2.1 12/13/2022 1201   MONOABS 0.4 02/05/2019 0346   EOSABS 0.5 (H) 12/13/2022 1201   BASOSABS 0.1 12/13/2022 1201    Lab Results  Component  Value Date   HGBA1C 5.9 07/31/2023    Assessment & Plan:      Hypertension Improved dizziness since reducing Lisinopril  from 30mg  to 20mg . Current blood pressure is 122/68. -Continue current regimen of Amlodipine  10mg , Carvedilol  6.25mg  BID, and Lisinopril  20mg . -Counseled on blood pressure goal of less than 130/80, low-sodium, DASH diet, medication compliance, 150 minutes of moderate intensity exercise per week. Discussed medication compliance, adverse effects.   Hyperkalemia Elevated potassium likely secondary to Lisinopril . Patient was prescribed Kayexalate  but did not follow up for repeat labs. -Order repeat labs today to reassess potassium level.  Peripheral Neuropathy Numbness in feet, affecting shoe size and walking. Current treatment with Gabapentin  is not controlling symptoms. -Discontinue Gabapentin . -Unable to tolerate Cymbalta  -Start Pregabalin  (Lyrica ) twice daily.  Type II Diabetes Mellitus A1c is 5.9, indicating good control. Patient is on Metformin  and Tresiba  12 units at night. -Continue current regimen. -Advise patient to monitor for hypoglycemic symptoms.  Dizziness Improved since reducing Lisinopril . Patient is currently taking Meclizine  1 daily as needed. -Advise patient to try discontinuing Meclizine  to assess if it is still needed.  General Health Maintenance -Declined flu shot. -Refer to exercise program at local [facility] PREP class         Meds ordered this encounter  Medications   amLODipine  (NORVASC ) 10 MG tablet    Sig: Take 1 tablet (10 mg total) by mouth daily.    Dispense:  90 tablet    Refill:  1   atorvastatin  (LIPITOR) 40 MG tablet    Sig: Take 1 tablet (40 mg total) by mouth daily.    Dispense:  90 tablet    Refill:  1   carvedilol  (COREG ) 6.25 MG tablet    Sig: Take 1 tablet (6.25 mg total) by mouth 2 (two) times daily with a meal.    Dispense:  180 tablet    Refill:  1   insulin  degludec (TRESIBA  FLEXTOUCH) 100 UNIT/ML FlexTouch  Pen    Sig: Inject 12 Units into the skin daily.    Dispense:  9 mL    Refill:  3    Dose decrease   lisinopril  (ZESTRIL ) 20 MG tablet    Sig: Take 1 tablet (20 mg total) by mouth daily.    Dispense:  90 tablet    Refill:  1    Dose decrease   metFORMIN  (GLUCOPHAGE ) 500 MG tablet    Sig: Take 2 tablets (1,000 mg total) by mouth 2 (two) times daily with a meal.    Dispense:  360 tablet    Refill:  1   pregabalin  (LYRICA ) 100 MG capsule    Sig: Take 1 capsule (100 mg total) by mouth 2 (two) times daily.  Dispense:  180 capsule    Refill:  1    Discontinue gabapentin     Follow-up: Return in about 6 months (around 01/28/2024) for Chronic medical conditions.       Corrina Sabin, MD, FAAFP. [facility] [place], [place] [phone]   07/31/2023, 5:20 PM

## 2023-08-01 ENCOUNTER — Other Ambulatory Visit: Payer: Self-pay | Admitting: Family Medicine

## 2023-08-01 ENCOUNTER — Other Ambulatory Visit (HOSPITAL_COMMUNITY): Payer: Self-pay

## 2023-08-01 ENCOUNTER — Other Ambulatory Visit: Payer: Self-pay

## 2023-08-01 ENCOUNTER — Encounter: Payer: Self-pay | Admitting: Family Medicine

## 2023-08-01 DIAGNOSIS — E875 Hyperkalemia: Secondary | ICD-10-CM

## 2023-08-01 DIAGNOSIS — I152 Hypertension secondary to endocrine disorders: Secondary | ICD-10-CM

## 2023-08-01 LAB — CMP14+EGFR
ALT: 6 [IU]/L (ref 0–44)
AST: 13 [IU]/L (ref 0–40)
Albumin: 4.4 g/dL (ref 3.9–4.9)
Alkaline Phosphatase: 65 [IU]/L (ref 44–121)
BUN/Creatinine Ratio: 23 (ref 10–24)
BUN: 27 mg/dL (ref 8–27)
Bilirubin Total: 0.4 mg/dL (ref 0.0–1.2)
CO2: 19 mmol/L — ABNORMAL LOW (ref 20–29)
Calcium: 9.4 mg/dL (ref 8.6–10.2)
Chloride: 103 mmol/L (ref 96–106)
Creatinine, Ser: 1.18 mg/dL (ref 0.76–1.27)
Globulin, Total: 2.8 g/dL (ref 1.5–4.5)
Glucose: 115 mg/dL — ABNORMAL HIGH (ref 70–99)
Potassium: 5.7 mmol/L — ABNORMAL HIGH (ref 3.5–5.2)
Sodium: 138 mmol/L (ref 134–144)
Total Protein: 7.2 g/dL (ref 6.0–8.5)
eGFR: 67 mL/min/{1.73_m2} (ref >59)

## 2023-08-01 MED ORDER — MECLIZINE HCL 25 MG PO TABS
25.0000 mg | ORAL_TABLET | Freq: Three times a day (TID) | ORAL | 0 refills | Status: DC | PRN
  Filled 2023-08-01: qty 90, 30d supply, fill #0

## 2023-08-01 MED ORDER — LISINOPRIL 10 MG PO TABS
10.0000 mg | ORAL_TABLET | Freq: Every day | ORAL | 1 refills | Status: DC
  Filled 2023-08-01 (×3): qty 90, 90d supply, fill #0

## 2023-08-01 MED ORDER — HYDROCHLOROTHIAZIDE 25 MG PO TABS
25.0000 mg | ORAL_TABLET | Freq: Every day | ORAL | 1 refills | Status: DC
  Filled 2023-08-01 (×3): qty 90, 90d supply, fill #0

## 2023-08-01 NOTE — Telephone Encounter (Signed)
 Requested medication (s) are due for refill today: yes  Requested medication (s) are on the active medication list: yes  Last refill:  04/29/23 #90 0 refills  Future visit scheduled: yes in 5 months  Notes to clinic:  not delegated per protocol . Do you want to refill Rx?     Requested Prescriptions  Pending Prescriptions Disp Refills   meclizine  (ANTIVERT ) 25 MG tablet 90 tablet 0    Sig: Take 1 tablet (25 mg total) by mouth 3 (three) times daily as needed for dizziness.     Not Delegated - Gastroenterology: Antiemetics Failed - 08/01/2023 10:04 AM      Failed - This refill cannot be delegated      Passed - Valid encounter within last 6 months    Recent Outpatient Visits           Yesterday Type 2 diabetes mellitus with other specified complication, with long-term current use of insulin  [facility])   [facility] [place]. [facility] Delbert Clam, MD   7 months ago Type 2 diabetes mellitus with other specified complication, with long-term current use of insulin  [facility])   [facility] Of [place]. [facility] Delbert Clam, MD   11 months ago Type 2 diabetes mellitus with other specified complication, with long-term current use of insulin  [facility])   [facility] Of [place]. [facility] [place], [place], [place]   1 year ago Type 2 diabetes mellitus with other specified complication, with long-term current use of insulin  [facility])   [facility] Of [place]. [facility] [place], [place], [place]   1 year ago Type 2 diabetes mellitus with other specified complication, with long-term current use of insulin  [facility])   [facility] Of [place]. [facility] Delbert Clam, MD       Future Appointments             In 5 months Delbert Clam, MD [facility] - [facility] Dept Of . [facility]

## 2023-08-02 ENCOUNTER — Telehealth: Payer: Self-pay

## 2023-08-02 ENCOUNTER — Other Ambulatory Visit: Payer: Self-pay

## 2023-08-02 ENCOUNTER — Other Ambulatory Visit (HOSPITAL_COMMUNITY): Payer: Self-pay

## 2023-08-02 NOTE — Telephone Encounter (Signed)
 Called [facility] program referral; left voicemail requesting return call.

## 2023-08-26 ENCOUNTER — Encounter: Payer: Self-pay | Admitting: Podiatry

## 2023-08-26 ENCOUNTER — Telehealth: Payer: Self-pay

## 2023-08-26 ENCOUNTER — Ambulatory Visit (INDEPENDENT_AMBULATORY_CARE_PROVIDER_SITE_OTHER): Payer: Medicare Other | Admitting: Podiatry

## 2023-08-26 DIAGNOSIS — M79675 Pain in left toe(s): Secondary | ICD-10-CM

## 2023-08-26 DIAGNOSIS — M79674 Pain in right toe(s): Secondary | ICD-10-CM | POA: Diagnosis not present

## 2023-08-26 DIAGNOSIS — B351 Tinea unguium: Secondary | ICD-10-CM

## 2023-08-26 NOTE — Telephone Encounter (Signed)
 Returned his call, inquiring about PREP, explained program, he states he has requested PT or someone to help him get on the treadmill; I encouraged him to talk with his provider next week when he sees her, and discuss PREP; if he decides he wants to attend, he will ask her to submit the referral.

## 2023-08-26 NOTE — Progress Notes (Signed)
This patient presents to the office with chief complaint of long thick painful nails.  Patient says the nails are painful walking and wearing shoes.  This patient is unable to self treat.  This patient is unable to trim his nails since she is unable to reach his nails.  She presents to the office for preventative foot care services. Patient is diabetic.  General Appearance  Alert, conversant and in no acute stress.  Vascular  Dorsalis pedis and posterior tibial  pulses are palpable  bilaterally.  Capillary return is within normal limits  bilaterally.   Neurologic  Senn-Weinstein monofilament wire test within normal limits  bilaterally. Muscle power within normal limits bilaterally.  Nails Thick disfigured discolored nails with subungual debris  from hallux to fifth toes bilaterally. No evidence of bacterial infection or drainage bilaterally.  Orthopedic  No limitations of motion  feet .  No crepitus or effusions noted.  No bony pathology or digital deformities noted.  DJD 1st MPJ right foot.  Skin  normotropic skin with no porokeratosis noted bilaterally.  No signs of infections or ulcers noted.     Onychomycosis  Nails  B/L.  Pain in right toes  Pain in left toes  Debridement of nails both feet followed trimming the nails with dremel tool.    RTC 3 months.   Helane Gunther DPM

## 2023-09-02 ENCOUNTER — Ambulatory Visit: Payer: Medicare Other | Attending: Family Medicine

## 2023-09-02 DIAGNOSIS — E875 Hyperkalemia: Secondary | ICD-10-CM

## 2023-09-03 ENCOUNTER — Other Ambulatory Visit: Payer: Self-pay

## 2023-09-03 ENCOUNTER — Encounter: Payer: Self-pay | Admitting: Family Medicine

## 2023-09-03 ENCOUNTER — Other Ambulatory Visit: Payer: Self-pay | Admitting: Family Medicine

## 2023-09-03 ENCOUNTER — Other Ambulatory Visit (HOSPITAL_COMMUNITY): Payer: Self-pay

## 2023-09-03 DIAGNOSIS — E1159 Type 2 diabetes mellitus with other circulatory complications: Secondary | ICD-10-CM

## 2023-09-03 LAB — BASIC METABOLIC PANEL
BUN/Creatinine Ratio: 16 (ref 10–24)
BUN: 15 mg/dL (ref 8–27)
CO2: 22 mmol/L (ref 20–29)
Calcium: 9.4 mg/dL (ref 8.6–10.2)
Chloride: 87 mmol/L — ABNORMAL LOW (ref 96–106)
Creatinine, Ser: 0.93 mg/dL (ref 0.76–1.27)
Glucose: 85 mg/dL (ref 70–99)
Potassium: 5.5 mmol/L — ABNORMAL HIGH (ref 3.5–5.2)
Sodium: 123 mmol/L — ABNORMAL LOW (ref 134–144)
eGFR: 89 mL/min/{1.73_m2} (ref >59)

## 2023-09-03 MED ORDER — HYDROCHLOROTHIAZIDE 50 MG PO TABS
50.0000 mg | ORAL_TABLET | Freq: Every day | ORAL | 1 refills | Status: DC
Start: 2023-09-03 — End: 2023-11-25
  Filled 2023-09-03 (×2): qty 90, 90d supply, fill #0
  Filled 2023-11-04: qty 90, 90d supply, fill #1

## 2023-09-03 MED ORDER — LISINOPRIL 5 MG PO TABS
5.0000 mg | ORAL_TABLET | Freq: Every day | ORAL | 1 refills | Status: DC
Start: 2023-09-03 — End: 2023-11-04
  Filled 2023-09-03 (×2): qty 90, 90d supply, fill #0

## 2023-10-08 LAB — HM DIABETES EYE EXAM

## 2023-10-24 ENCOUNTER — Encounter: Payer: Self-pay | Admitting: Family Medicine

## 2023-11-04 ENCOUNTER — Ambulatory Visit: Attending: Family Medicine | Admitting: Family Medicine

## 2023-11-04 ENCOUNTER — Other Ambulatory Visit: Payer: Self-pay | Admitting: Family Medicine

## 2023-11-04 ENCOUNTER — Encounter: Payer: Self-pay | Admitting: Family Medicine

## 2023-11-04 DIAGNOSIS — Z7984 Long term (current) use of oral hypoglycemic drugs: Secondary | ICD-10-CM | POA: Insufficient documentation

## 2023-11-04 DIAGNOSIS — I10 Essential (primary) hypertension: Secondary | ICD-10-CM | POA: Diagnosis not present

## 2023-11-04 DIAGNOSIS — R42 Dizziness and giddiness: Secondary | ICD-10-CM

## 2023-11-04 DIAGNOSIS — Z79899 Other long term (current) drug therapy: Secondary | ICD-10-CM | POA: Diagnosis not present

## 2023-11-04 DIAGNOSIS — E875 Hyperkalemia: Secondary | ICD-10-CM | POA: Insufficient documentation

## 2023-11-04 DIAGNOSIS — R296 Repeated falls: Secondary | ICD-10-CM | POA: Diagnosis present

## 2023-11-04 DIAGNOSIS — E1140 Type 2 diabetes mellitus with diabetic neuropathy, unspecified: Secondary | ICD-10-CM | POA: Insufficient documentation

## 2023-11-04 DIAGNOSIS — I152 Hypertension secondary to endocrine disorders: Secondary | ICD-10-CM

## 2023-11-04 DIAGNOSIS — E1169 Type 2 diabetes mellitus with other specified complication: Secondary | ICD-10-CM

## 2023-11-04 MED ORDER — MECLIZINE HCL 25 MG PO TABS
25.0000 mg | ORAL_TABLET | Freq: Three times a day (TID) | ORAL | 0 refills | Status: AC | PRN
Start: 2023-11-04 — End: ?
  Filled 2023-11-04: qty 90, 30d supply, fill #0

## 2023-11-04 MED ORDER — BD PEN NEEDLE NANO 2ND GEN 32G X 4 MM MISC
1.0000 [IU] | Freq: Every day | 0 refills | Status: DC
  Filled 2023-11-04: qty 100, 90d supply, fill #0

## 2023-11-04 NOTE — Progress Notes (Signed)
 Virtual Visit via Audio Note  I connected with Frederick Moody, on 11/04/2023 at 1:50 PM by video enabled telemedicine device and verified that I am speaking with the correct person using two identifiers.   Clinician has audio - video capabilities but patient was only able to operate/preferred audio.  Consent: I discussed the limitations, risks, security and privacy concerns of performing an evaluation and management service by telemedicine and the availability of in person appointments. I also discussed with the patient that there may be a patient responsible charge related to this service. The patient expressed understanding and agreed to proceed.   Location of Patient: Home  Location of Provider: Clinic   Persons participating in Telemedicine visit: Frederick Moody Dr. Adan Holms    Discussed the use of AI scribe software for clinical note transcription with the patient, who gave verbal consent to proceed.  History of Present Illness Frederick Moody is a 68 year old male  with a history of  type 2 diabetes mellitus with diabetic neuropathy,, hypertension who presents with dizziness and falls.  Dizziness occurs primarily when standing and often leads to falls unless he sits quickly. Falls typically result in landing on his right side, causing pain suggestive of a bruised bone in the buttock.  He recently stopped pregabalin , initially noting an improvement in dizziness, but symptoms returned after three to four days with less severity.  He has hypertension and takes lisinopril  5 mg daily in addition to HCTZ 50 mg, carvedilol  6.25 mg twice daily and amlodipine  10 mg.  He has had hyperkalemia and after his last set of labs with a potassium of 5.5 lisinopril  dose was decreased to 5 mg and hydrochlorothiazide  dose increased.  He cannot check his blood pressure at home due to malfunctioning equipment and is uncertain about interpreting readings.  Blood glucose levels are stable  around 100 mg/dL with no hypoglycemic episodes.       Past Medical History:  Diagnosis Date   Diabetes mellitus without complication (HCC)    ETOH abuse    Allergies  Allergen Reactions   Penicillins Swelling and Rash    Neck became swollen Did it involve swelling of the face/tongue/throat, SOB, or low BP? Yes Did it involve sudden or severe rash/hives, skin peeling, or any reaction on the inside of your mouth or nose? Unk Did you need to seek medical attention at a hospital or doctor's office? Unk When did it last happen? Happened in the 1980's If all above answers are "NO", may proceed with cephalosporin use.     Current Outpatient Medications on File Prior to Visit  Medication Sig Dispense Refill   Accu-Chek Softclix Lancets lancets Use as instructed three times daily before meals. E11.69 100 each 6   amLODipine  (NORVASC ) 10 MG tablet Take 1 tablet (10 mg total) by mouth daily. 90 tablet 1   atorvastatin  (LIPITOR) 40 MG tablet Take 1 tablet (40 mg total) by mouth daily. 90 tablet 1   blood glucose meter kit and supplies Please check your blood sugars twice daily. Once in the morning and once before bed. 1 each 0   Blood Glucose Monitoring Suppl (ACCU-CHEK GUIDE ME) w/Device KIT USE THREE TIMES DAILY BEFORE MEALS DX E11.49 1 kit 0   carvedilol  (COREG ) 6.25 MG tablet Take 1 tablet (6.25 mg total) by mouth 2 (two) times daily with a meal. 180 tablet 1   glucose blood (ACCU-CHEK GUIDE TEST) test strip Use to check blood sugar 3 times daily. E11.69 100 each  6   hydrochlorothiazide  (HYDRODIURIL ) 50 MG tablet Take 1 tablet (50 mg total) by mouth daily. 90 tablet 1   insulin  degludec (TRESIBA  FLEXTOUCH) 100 UNIT/ML FlexTouch Pen Inject 12 Units into the skin daily. 9 mL 3   Insulin  Pen Needle (BD PEN NEEDLE NANO 2ND GEN) 32G X 4 MM MISC Use as directed. 100 each 0   meclizine  (ANTIVERT ) 25 MG tablet Take 1 tablet (25 mg total) by mouth 3 (three) times daily as needed for dizziness. 90  tablet 0   metFORMIN  (GLUCOPHAGE ) 500 MG tablet Take 2 tablets (1,000 mg total) by mouth 2 (two) times daily with a meal. 360 tablet 1   pregabalin  (LYRICA ) 100 MG capsule Take 1 capsule (100 mg total) by mouth 2 (two) times daily. 180 capsule 1   No current facility-administered medications on file prior to visit.    ROS: See HPI  Observations/Objective: Awake, alert, oriented x3 Not in acute distress Normal mood      Latest Ref Rng & Units 09/02/2023   11:18 AM 07/31/2023    4:21 PM 12/13/2022   12:01 PM  CMP  Glucose 70 - 99 mg/dL 85  409  82   BUN 8 - 27 mg/dL 15  27  17    Creatinine 0.76 - 1.27 mg/dL 8.11  9.14  7.82   Sodium 134 - 144 mmol/L 123  138  134   Potassium 3.5 - 5.2 mmol/L 5.5  5.7  6.0   Chloride 96 - 106 mmol/L 87  103  98   CO2 20 - 29 mmol/L 22  19  23    Calcium  8.6 - 10.2 mg/dL 9.4  9.4  95.6   Total Protein 6.0 - 8.5 g/dL  7.2    Total Bilirubin 0.0 - 1.2 mg/dL  0.4    Alkaline Phos 44 - 121 IU/L  65    AST 0 - 40 IU/L  13    ALT 0 - 44 IU/L  6      Lipid Panel     Component Value Date/Time   CHOL 122 08/09/2022 1101   TRIG 68 08/09/2022 1101   HDL 52 08/09/2022 1101   CHOLHDL 2.3 08/09/2022 1101   LDLCALC 56 08/09/2022 1101   LABVLDL 14 08/09/2022 1101    Lab Results  Component Value Date   HGBA1C 5.9 07/31/2023      Assessment & Plan Hypertension Recent dizziness and falls likely due to hypotension from lisinopril . -Last BP was 122/68 at his last office visit -Unfortunately he is unable to check his blood pressure at home due to a nonworking blood pressure monitor - Discontinue lisinopril . -Continue all other medications - Arrange for a functional blood pressure monitor. - Schedule follow-up in three weeks to evaluate blood pressure and review medications.  Dizziness Episodes likely related to blood pressure changes. - Monitor symptoms post-lisinopril  discontinuation. - Reassess dizziness and blood pressure in three weeks. -  Leads to be hypoglycemia as his blood sugar levels are normal   Hyperkalemia Previous potassium level 5.5 mmol/L - Recheck potassium levels at next appointment.      No orders of the defined types were placed in this encounter.   Follow Up Instructions: Return in about 3 weeks (around 11/25/2023) for Blood Pressure follow-up with PCP.    I discussed the assessment and treatment plan with the patient. The patient was provided an opportunity to ask questions and all were answered. The patient agreed with the plan and demonstrated an understanding of  the instructions.   The patient was advised to call back or seek an in-person evaluation if the symptoms worsen or if the condition fails to improve as anticipated.     I provided 14 minutes total of Telehealth time during this encounter including median intraservice time, reviewing previous notes, investigations, ordering medications, medical decision making, coordinating care and patient verbalized understanding at the end of the visit.     Joaquin Mulberry, MD, FAAFP. [facility] [place], [place] [phone]   11/04/2023, 1:50 PM

## 2023-11-04 NOTE — Patient Instructions (Signed)
 VISIT SUMMARY:  Today, we discussed your recent dizziness and falls, which seem to be related to your blood pressure medication. We also reviewed your diabetes management, peripheral neuropathy, and potassium levels.  YOUR PLAN:  -HYPERTENSION: Your dizziness and falls are likely due to low blood pressure caused by your current medication, lisinopril . We will stop this medication and arrange for a functional blood pressure monitor. Please follow up in three weeks to check your blood pressure and review your medications.  -DIZZINESS: Your dizziness episodes are likely related to changes in your blood pressure. We will monitor your symptoms after stopping lisinopril  and reassess in three weeks.  -TYPE 2 DIABETES MELLITUS: Your blood glucose levels are well-managed at around 100 mg/dL. No changes are needed at this time.  -PERIPHERAL NEUROPATHY: Your symptoms have improved after stopping pregabalin . No further action is needed at this time.  -HYPERKALEMIA: Your previous potassium level was high at 5.5 mmol/L, but this is not causing your dizziness. We will recheck your potassium levels at your next appointment.  INSTRUCTIONS:  Please follow up in three weeks to evaluate your blood pressure, review your medications, and recheck your potassium levels.

## 2023-11-05 ENCOUNTER — Other Ambulatory Visit: Payer: Self-pay

## 2023-11-05 ENCOUNTER — Other Ambulatory Visit (HOSPITAL_COMMUNITY): Payer: Self-pay

## 2023-11-06 ENCOUNTER — Other Ambulatory Visit: Payer: Self-pay

## 2023-11-12 ENCOUNTER — Ambulatory Visit: Payer: Self-pay

## 2023-11-12 ENCOUNTER — Ambulatory Visit: Payer: Self-pay | Admitting: *Deleted

## 2023-11-12 NOTE — Telephone Encounter (Signed)
 Reason for Disposition  Simple fainting is a chronic symptom (has occurred multiple times)    For follow up 11/25/2023 per Dr. Adan Holms  Answer Assessment - Initial Assessment Questions 1. ONSET: "How long were you unconscious?" (minutes) "When did it happen?"     I didn't actually pass out.   But I did hit the floor.   I was getting up right after I went down.   I did not actually black out.  I really don't know what happened or when it happened.    I called my daughter about it.     This happened a week and a half ago or 2 weeks.  My daughter said they were going to set me up with another appt.   I never heard anything.   I wanted to be sure I was ok.    It happened 3-4 days straight in a row.   I did not go to the ED.   I could not get out of bed for that time.   It happened on a Sat. I think.   I could not contact doctor's office.   I've not talked with my daughter today.    The office never called to set me up for an appt.    That's probably why my daughter called.    I'm not having any more spells.  It happened over a 2-3 day period of time.   It was so bad I was holding onto the walls t go to the bathroom.    Now I'm fine.     I'm not having any chest pains, no shortness of breath or nothing.   I was not sick.    I just started passing out for all that day and the rest of that day and I'm not sure how long after that.   That was about a week ago or longer.    I'm fine now.  The doctor had changed some of my medication before this happened.   I think it was my BP medication but I'm not sure.   I was on gabapentin  and she stopped that. She put me on something else.   I called the doctor and told her I could not take the new medicine.   So she stopped the new medicine.   My legs are really restlessness at night.   I was on the gabapentin  for that.    I'm not on it now.    I'm not sure what I'm taking now.    My BP medication was changed.   I have 3 different kinds of medication.    One for BP, one took  the place of the gabapentin , and one other medication.    The doctor told me to stop the gabapentin  and put me on something else.   It didn't help.   I'm still not on the gabapentin .     Something about the gabapentin  making my potassium be too high.       My daughter goes with me every time I go to the doctor.   She helps me with all this.   2. CONTENT: "What happened during period of unconsciousness?" (e.g., seizure activity)      *No Answer* 3. MENTAL STATUS: "Alert and oriented now?" (oriented x 3 = name, month, location)      *No Answer* 4. TRIGGER: "What do you think caused the fainting?" "What were you doing just before you fainted?"  (e.g., exercise, sudden standing  up, prolonged standing)     *No Answer* 5. RECURRENT SYMPTOM: "Have you ever passed out before?" If Yes, ask: "When was the last time?" and "What happened that time?"      *No Answer* 6. INJURY: "Did you sustain any injury during the fall?"      *No Answer* 7. CARDIAC SYMPTOMS: "Have you had any of the following symptoms: chest pain, difficulty breathing, palpitations?"     *No Answer* 8. NEUROLOGIC SYMPTOMS: "Have you had any of the following symptoms: headache, numbness, vertigo, weakness?"     *No Answer* 9. GI SYMPTOMS: "Have you had any of the following symptoms: abdomen pain, vomiting, diarrhea, blood in stools?"     *No Answer* 10. OTHER SYMPTOMS: "Do you have any other symptoms?"       *No Answer* 11. PREGNANCY: "Is there any chance you are pregnant?" "When was your last menstrual period?"       *No Answer*  Protocols used: Fainting-A-AH  Chief Complaint:  Daughter, [employer], not on [place] but goes with him to all his appts, called in.   Another triage nurse spoke with her.   See those triage notes.  The line disconnected so daughter called back in.  I took the call from there.   Pt was not with daughter during the initial triage encounter.   I read notes from prior triage encounter and determined I needed to  triage the  pt.   Daughter was calling in for an appt but with his symptoms I was not comfortable scheduling an appt.      The other triage nurse advised he needed to go to the ED but was not able to get in contact with the pt.    His daughter, Hollie gave me his direct number because I let her know he needed to be triaged directly because he may need to go to the ED.   She gave me his direct number and he answered.   (The number listed in the chart is for his daughter Chilton Sallade).   See my notes of conversation with pt.     It was a involved conversation so see those notes.   In reading Dr. Jennet Mode notes from video visit done with pt on 11/04/2023 it was concerning his dizziness and passing out.   She needs him to follow up around 11/25/2023.   He needs an appt.   No schedule is coming up for Dr. Newlin during this time frame.    I'm sending a message to Dr. Newlin to have someone contact Hollie, per pt's request because she is the one that manages his medical issues.  She can be reached at [phone] to schedule a follow up appt around 11/25/2023 per Dr. Jennet Mode note.      Chief Complaint: Pt denies having any more dizzy spells or passing out for a week now.   "I think it was that new medicine she put me on (Lyrica ) for my legs that caused all that".    After being off that medicine for 2-3 days I stopped being dizzy and passing out any more.   I'm fine now.   I feel great. Symptoms: None now. Frequency: Since being off the Lyrica  for 2-3 days. Pertinent Negatives: Patient denies passing out or feeling dizzy now for at least the past week. Disposition: [] ED /[] Urgent Care (no appt availability in office) / [x] Appointment(In office/virtual)/ []  [place] [facility]/ [] Home Care/ [] Refused Recommended Disposition /[] [place] [facility]/ []   Follow-up with PCP Additional Notes: Message sent to [facility] for someone to reach out and contact daughter, Deamonte Sayegh at [phone] to schedule him a follow up appt around 11/25/2023 per Dr. Newlin.  Symptoms: See above Frequency: N/A Pertinent Negatives: Patient denies N/A Disposition: [] ED /[] Urgent Care (no appt availability in office) / [] Appointment(In office/virtual)/ []  [place] [facility]/ [] Home Care/ [] Refused Recommended Disposition /[] [place] [facility]/ []  Follow-up with PCP Additional Notes: N/A d  See above

## 2023-11-12 NOTE — Telephone Encounter (Signed)
 Call disconnected.   Daughter calling back in.    I asked if she had any questions.   The nurse advised he needed to go to the ED.    She only spoke to the daughter.   Daughter said he probably would not go to the ED.    Nurse said she would call pt to tell him to go to the ED.     Our line disconnected.    Daughter sadi he is having dizzy spells like when he saw Dr. Newlin last Monday  It's not better but not worse.  Dr Newlin wants to see him again in 3 weeks.   Someone was supposed to call him back to get an appt.   No one has called back in.  It's been a couple of weeks out at this point.     So pt was not present during triage.   He needs to be triaged.   I will attempt to call him and triage him.

## 2023-11-12 NOTE — Telephone Encounter (Signed)
 Please have him go to the emergency room.  Thanks

## 2023-11-12 NOTE — Telephone Encounter (Signed)
 Routing to PCP for review.

## 2023-11-12 NOTE — Telephone Encounter (Signed)
 Attempted to call [facility] to let them know about patient's symptoms.  No answer at this time.

## 2023-11-12 NOTE — Telephone Encounter (Signed)
 Duplicate message.

## 2023-11-12 NOTE — Telephone Encounter (Signed)
 Copied from CRM [id number]. Topic: Clinical - Red Word Triage >> Nov 12, 2023 12:23 PM [place] B wrote: [facility] that prompted transfer to Nurse Triage: pt had blackout and hit the floor, 2 days ago and dizziness    Chief Complaint: Dizziness/Syncopal episodes Symptoms: dizziness, nausea Frequency: months but worse lately Pertinent Negatives: Patient denies headaches, chest pain, difficulty breathing, blood in his stool,  Disposition: [x] ED /[] Urgent Care (no appt availability in office) / [] Appointment(In office/virtual)/ []  [place] [facility]/ [] Home Care/ [] Refused Recommended Disposition /[] [place] [facility]/ []  Follow-up with PCP Additional Notes: Patient's daughter called and advised that on 11/04/2023 the patient has a video appointment with his PCP about his symptoms. Patient had told his daughter that he has had episodes where he has woken up on the floor looking up at his brother (syncopal episodes).  Patient had stopped a blood pressure medication---last week. Patient also had stopped a medication for nerve pain.  Patient told his daughter that he is having syncopal episodes everyday now. Sometimes he can feel it coming on and he will sit down. Patient also hasn't been able to eat a lot lately. Daughter states that she isn't with the patient at the moment and she isn't sure if he is completely unresponsive or not during these episodes. Daughter states that the patient doesn't drink a lot of water  and she feels like he probably isn't hydrating as much as he should be. Patient was seen and evaluated in person in Feb and he was also seen for dizziness at that time.  Patient has told his daughter that it has gotten worse. This RN attempted to call the patient in order to speak with him about his symptoms.  No answer and the voicemail box has not been set up yet.   Reason for Disposition  Any head or face injury  Answer Assessment - Initial Assessment Questions 1. ONSET:  "How long were you unconscious?" (minutes) "When did it happen?"     unknown 2. CONTENT: "What happened during period of unconsciousness?" (e.g., seizure activity)      unknown 3. MENTAL STATUS: "Alert and oriented now?" (oriented x 3 = name, month, location)      Daughter not with patient 4. TRIGGER: "What do you think caused the fainting?" "What were you doing just before you fainted?"  (e.g., exercise, sudden standing up, prolonged standing)     unknown 5. RECURRENT SYMPTOM: "Have you ever passed out before?" If Yes, ask: "When was the last time?" and "What happened that time?"      --------- 6. INJURY: "Did you sustain any injury during the fall?"      Patient told his daughter he landed on his rear end and that is bruised/sore.  Daughter also states that the patient hit a door and a small cut on his forehead from that a few weeks ago. 7. CARDIAC SYMPTOMS: "Have you had any of the following symptoms: chest pain, difficulty breathing, palpitations?"     Not that the daughter has heard the patient say 8. NEUROLOGIC SYMPTOMS: "Have you had any of the following symptoms: headache, numbness, vertigo, weakness?"     Just baseline diabetic neuropathy issues--unchanged 9. GI SYMPTOMS: "Have you had any of the following symptoms: abdomen pain, vomiting, diarrhea, blood in stools?"     "He hasn't had much of an appetite but he said he had an upset stomach" 10. OTHER SYMPTOMS: "Do you have any other symptoms?"       ----  Protocols used: [employer]

## 2023-11-12 NOTE — Telephone Encounter (Signed)
Called daughter and left a voicemail.

## 2023-11-13 NOTE — Telephone Encounter (Signed)
 Daughter was called and she states that they were informed to go to ED on yesterday when she called. Patient does not want to go, I informed the daughter to keep appointment she has on 11/25/23 at 11:10

## 2023-11-25 ENCOUNTER — Other Ambulatory Visit (HOSPITAL_COMMUNITY): Payer: Self-pay

## 2023-11-25 ENCOUNTER — Other Ambulatory Visit: Payer: Self-pay

## 2023-11-25 ENCOUNTER — Encounter: Payer: Self-pay | Admitting: Family Medicine

## 2023-11-25 ENCOUNTER — Ambulatory Visit: Attending: Family Medicine | Admitting: Family Medicine

## 2023-11-25 VITALS — BP 130/84 | HR 83 | Ht 68.0 in | Wt 164.2 lb

## 2023-11-25 DIAGNOSIS — Z1211 Encounter for screening for malignant neoplasm of colon: Secondary | ICD-10-CM

## 2023-11-25 DIAGNOSIS — Z79899 Other long term (current) drug therapy: Secondary | ICD-10-CM | POA: Insufficient documentation

## 2023-11-25 DIAGNOSIS — R197 Diarrhea, unspecified: Secondary | ICD-10-CM | POA: Insufficient documentation

## 2023-11-25 DIAGNOSIS — I152 Hypertension secondary to endocrine disorders: Secondary | ICD-10-CM

## 2023-11-25 DIAGNOSIS — E1140 Type 2 diabetes mellitus with diabetic neuropathy, unspecified: Secondary | ICD-10-CM | POA: Diagnosis present

## 2023-11-25 DIAGNOSIS — Z7984 Long term (current) use of oral hypoglycemic drugs: Secondary | ICD-10-CM | POA: Diagnosis not present

## 2023-11-25 DIAGNOSIS — R634 Abnormal weight loss: Secondary | ICD-10-CM | POA: Diagnosis not present

## 2023-11-25 DIAGNOSIS — R3912 Poor urinary stream: Secondary | ICD-10-CM | POA: Diagnosis not present

## 2023-11-25 DIAGNOSIS — I10 Essential (primary) hypertension: Secondary | ICD-10-CM | POA: Insufficient documentation

## 2023-11-25 DIAGNOSIS — E875 Hyperkalemia: Secondary | ICD-10-CM | POA: Diagnosis not present

## 2023-11-25 DIAGNOSIS — Z794 Long term (current) use of insulin: Secondary | ICD-10-CM | POA: Insufficient documentation

## 2023-11-25 DIAGNOSIS — E1149 Type 2 diabetes mellitus with other diabetic neurological complication: Secondary | ICD-10-CM

## 2023-11-25 DIAGNOSIS — E1159 Type 2 diabetes mellitus with other circulatory complications: Secondary | ICD-10-CM

## 2023-11-25 DIAGNOSIS — I951 Orthostatic hypotension: Secondary | ICD-10-CM | POA: Diagnosis not present

## 2023-11-25 DIAGNOSIS — Z125 Encounter for screening for malignant neoplasm of prostate: Secondary | ICD-10-CM

## 2023-11-25 DIAGNOSIS — R42 Dizziness and giddiness: Secondary | ICD-10-CM | POA: Insufficient documentation

## 2023-11-25 MED ORDER — HYDROCHLOROTHIAZIDE 25 MG PO TABS
25.0000 mg | ORAL_TABLET | Freq: Every day | ORAL | 1 refills | Status: DC
  Filled 2023-11-25: qty 90, 90d supply, fill #0

## 2023-11-25 MED ORDER — GABAPENTIN 300 MG PO CAPS
300.0000 mg | ORAL_CAPSULE | Freq: Every day | ORAL | 1 refills | Status: DC
Start: 2023-11-25 — End: 2024-01-27
  Filled 2023-11-25: qty 90, 90d supply, fill #0

## 2023-11-25 NOTE — Patient Instructions (Signed)
 Orthostatic Hypotension Blood pressure is a measurement of how strongly, or weakly, your circulating blood is pressing against the walls of your arteries. Orthostatic hypotension is a drop in blood pressure that can happen when you change positions, such as when you go from lying down to standing. Arteries are blood vessels that carry blood from your heart throughout your body. When blood pressure is too low, you may not get enough blood to your brain or to the rest of your organs. Orthostatic hypotension can cause light-headedness, sweating, rapid heartbeat, blurred vision, and fainting. These symptoms require further investigation into the cause. What are the causes? Orthostatic hypotension can be caused by many things, including: Sudden changes in posture, such as standing up quickly after you have been sitting or lying down. Loss of blood (anemia) or loss of body fluids (dehydration). Heart problems, neurologic problems, or hormone problems. Pregnancy. Aging. The risk for this condition increases as you get older. Severe infection (sepsis). Certain medicines, such as medicines for high blood pressure or medicines that make the body lose excess fluids (diuretics). What are the signs or symptoms? Symptoms of this condition may include: Weakness, light-headedness, or dizziness. Sweating. Blurred vision. Tiredness (fatigue). Rapid heartbeat. Fainting, in severe cases. How is this diagnosed? This condition is diagnosed based on: Your symptoms and medical history. Your blood pressure measurements. Your health care provider will check your blood pressure when you are: Lying down. Sitting. Standing. A blood pressure reading is recorded as two numbers, such as "120 over 80" (or 120/80). The first ("top") number is called the systolic pressure. It is a measure of the pressure in your arteries as your heart beats. The second ("bottom") number is called the diastolic pressure. It is a measure of  the pressure in your arteries when your heart relaxes between beats. Blood pressure is measured in a unit called mmHg. Healthy blood pressure for most adults is 120/80 mmHg. Orthostatic hypotension is defined as a 20 mmHg drop in systolic pressure or a 10 mmHg drop in diastolic pressure within 3 minutes of standing. Other information or tests that may be used to diagnose orthostatic hypotension include: Your other vital signs, such as your heart rate and temperature. Blood tests. An electrocardiogram (ECG) or echocardiogram. A Holter monitor. This is a device you wear that records your heart rhythm continuously, usually for 24-48 hours. Tilt table test. For this test, you will be safely secured to a table that moves you from a lying position to an upright position. Your heart rhythm and blood pressure will be monitored during the test. How is this treated? This condition may be treated by: Changing your diet. This may involve eating more salt (sodium) or drinking more water. Changing the dosage of certain medicines you are taking that might be lowering your blood pressure. Correcting the underlying reason for the orthostatic hypotension. Wearing compression stockings. Taking medicines to raise your blood pressure. Avoiding actions that trigger symptoms. Follow these instructions at home: Medicines Take over-the-counter and prescription medicines only as told by your health care provider. Follow instructions from your health care provider about changing the dosage of your current medicines, if this applies. Do not stop or adjust any of your medicines on your own. Eating and drinking  Drink enough fluid to keep your urine pale yellow. Eat extra salt only as directed. Do not add extra salt to your diet unless advised by your health care provider. Eat frequent, small meals. Avoid standing up suddenly after eating. General instructions  Get up slowly from lying down or sitting positions. This  gives your blood pressure a chance to adjust. Avoid hot showers and excessive heat as directed by your health care provider. Engage in regular physical activity as directed by your health care provider. If you have compression stockings, wear them as told. Keep all follow-up visits. This is important. Contact a health care provider if: You have a fever for more than 2-3 days. You feel more thirsty than usual. You feel dizzy or weak. Get help right away if: You have chest pain. You have a fast or irregular heartbeat. You become sweaty or feel light-headed. You feel short of breath. You faint. You have any symptoms of a stroke. "BE FAST" is an easy way to remember the main warning signs of a stroke: B - Balance. Signs are dizziness, sudden trouble walking, or loss of balance. E - Eyes. Signs are trouble seeing or a sudden change in vision. F - Face. Signs are sudden weakness or numbness of the face, or the face or eyelid drooping on one side. A - Arms. Signs are weakness or numbness in an arm. This happens suddenly and usually on one side of the body. S - Speech. Signs are sudden trouble speaking, slurred speech, or trouble understanding what people say. T - Time. Time to call emergency services. Write down what time symptoms started. You have other signs of a stroke, such as: A sudden, severe headache with no known cause. Nausea or vomiting. Seizure. These symptoms may represent a serious problem that is an emergency. Do not wait to see if the symptoms will go away. Get medical help right away. Call your local emergency services (911 in the [place] Do not drive yourself to the hospital. Summary Orthostatic hypotension is a sudden drop in blood pressure. It can cause light-headedness, sweating, rapid heartbeat, blurred vision, and fainting. Orthostatic hypotension can be diagnosed by having your blood pressure taken while lying down, sitting, and then standing. Treatment may involve  changing your diet, wearing compression stockings, sitting up slowly, adjusting your medicines, or correcting the underlying reason for the orthostatic hypotension. Get help right away if you have chest pain, a fast or irregular heartbeat, or symptoms of a stroke. This information is not intended to replace advice given to you by your health care provider. Make sure you discuss any questions you have with your health care provider. Document Revised: 08/25/2020 Document Reviewed: 08/25/2020 Elsevier Patient Education  2024 [employer].

## 2023-11-25 NOTE — Progress Notes (Signed)
 Subjective:  Patient ID: Frederick Moody, male    DOB: [birth year]  Age: 68 y.o. MRN: [id number]  CC: Medical Management of Chronic Issues (No concerns)     Discussed the use of AI scribe software for clinical note transcription with the patient, who gave verbal consent to proceed.  History of Present Illness Frederick Moody is a 68 year old male with  a history of  type 2 diabetes mellitus with diabetic neuropathy, hypertension who presents with dizziness and weight loss.  Dizziness occurs primarily after standing for a while (like when he has been standing on the sink) and resolves upon sitting. It is not present when standing up from sitting but can occur when sitting up from lying down or turning his head. He experiences falls due to dizziness four to five times daily. There is no nausea, vomiting, headaches, or blurry vision, but sometimes vomiting occurs after drinking milk.  Weight has decreased from 180 to 164 pounds over 12 months. Appetite is decreased, with difficulty finding agreeable food. Diarrhea has been present since a hospital stay three to four years ago, described as watery without blood or abdominal cramping. He continues to decline referral for colonoscopy and Cologuard test were ordered but he was never able to collect an appropriate sample.  He takes amlodipine , carvedilol , and hydrochlorothiazide , having stopped lisinopril  due to dizziness. Blood pressure is 130/84, and blood sugar levels are around 100. Last set of labs did reveal hyperkalemia. He experiences low energy and leg discomfort at night, previously managed with gabapentin , which he wishes to restart due to leg jerking and difficulty sleeping.    Past Medical History:  Diagnosis Date   Diabetes mellitus without complication (HCC)    ETOH abuse     Past Surgical History:  Procedure Laterality Date   APPENDECTOMY     IR THORACENTESIS ASP PLEURAL SPACE W/IMG GUIDE  02/06/2019   IRRIGATION AND  DEBRIDEMENT ABSCESS N/A 01/13/2019   Procedure: IRRIGATION AND DEBRIDEMENT OF BACK ABSCESS;  Surgeon: Dareen Ebbing, MD;  Location: [facility] OR;  Service: General;  Laterality: N/A;    No family history on file.  Social History   Socioeconomic History   Marital status: Single    Spouse name: Not on file   Number of children: Not on file   Years of education: Not on file   Highest education level: Not on file  Occupational History   Not on file  Tobacco Use   Smoking status: Never   Smokeless tobacco: Never  Vaping Use   Vaping status: Never Used  Substance and Sexual Activity   Alcohol  use: Not Currently   Drug use: Yes    Types: Marijuana   Sexual activity: Not on file  Other Topics Concern   Not on file  Social History Narrative   Not on file   Social Drivers of Health   Financial Resource Strain: Low Risk  (11/21/2022)   Overall Financial Resource Strain (CARDIA)    Difficulty of Paying Living Expenses: Not hard at all  Food Insecurity: No Food Insecurity (11/21/2022)   Hunger Vital Sign    Worried About Running Out of Food in the Last Year: Never true    Ran Out of Food in the Last Year: Never true  Transportation Needs: No Transportation Needs (11/21/2022)   PRAPARE - Administrator, Civil Service (Medical): No    Lack of Transportation (Non-Medical): No  Physical Activity: Inactive (11/21/2022)   Exercise Vital Sign  Days of Exercise per Week: 0 days    Minutes of Exercise per Session: 0 min  Stress: No Stress Concern Present (11/21/2022)   [employer] of Occupational Health - Occupational Stress Questionnaire    Feeling of Stress : Not at all  Social Connections: Moderately Integrated (11/21/2022)   Social Connection and Isolation Panel [NHANES]    Frequency of Communication with Friends and Family: More than three times a week    Frequency of Social Gatherings with Friends and Family: Three times a week    Attends Religious Services: More than 4  times per year    Active Member of Clubs or Organizations: Yes    Attends Banker Meetings: 1 to 4 times per year    Marital Status: Divorced    Allergies  Allergen Reactions   Penicillins Swelling and Rash    Neck became swollen Did it involve swelling of the face/tongue/throat, SOB, or low BP? Yes Did it involve sudden or severe rash/hives, skin peeling, or any reaction on the inside of your mouth or nose? Unk Did you need to seek medical attention at a hospital or doctor's office? Unk When did it last happen? Happened in the 1980's If all above answers are "NO", may proceed with cephalosporin use.     Outpatient Medications Prior to Visit  Medication Sig Dispense Refill   amLODipine  (NORVASC ) 10 MG tablet Take 1 tablet (10 mg total) by mouth daily. 90 tablet 1   atorvastatin  (LIPITOR) 40 MG tablet Take 1 tablet (40 mg total) by mouth daily. 90 tablet 1   carvedilol  (COREG ) 6.25 MG tablet Take 1 tablet (6.25 mg total) by mouth 2 (two) times daily with a meal. 180 tablet 1   meclizine  (ANTIVERT ) 25 MG tablet Take 1 tablet (25 mg total) by mouth 3 (three) times daily as needed for dizziness. 90 tablet 0   metFORMIN  (GLUCOPHAGE ) 500 MG tablet Take 2 tablets (1,000 mg total) by mouth 2 (two) times daily with a meal. 360 tablet 1   hydrochlorothiazide  (HYDRODIURIL ) 50 MG tablet Take 1 tablet (50 mg total) by mouth daily. 90 tablet 1   pregabalin  (LYRICA ) 100 MG capsule Take 1 capsule (100 mg total) by mouth 2 (two) times daily. 180 capsule 1   Accu-Chek Softclix Lancets lancets Use as instructed three times daily before meals. E11.69 (Patient not taking: Reported on 11/25/2023) 100 each 6   blood glucose meter kit and supplies Please check your blood sugars twice daily. Once in the morning and once before bed. (Patient not taking: Reported on 11/25/2023) 1 each 0   Blood Glucose Monitoring Suppl (ACCU-CHEK GUIDE ME) w/Device KIT USE THREE TIMES DAILY BEFORE MEALS DX E11.49 (Patient  not taking: Reported on 11/25/2023) 1 kit 0   glucose blood (ACCU-CHEK GUIDE TEST) test strip Use to check blood sugar 3 times daily. E11.69 (Patient not taking: Reported on 11/25/2023) 100 each 6   insulin  degludec (TRESIBA  FLEXTOUCH) 100 UNIT/ML FlexTouch Pen Inject 12 Units into the skin daily. (Patient not taking: Reported on 11/25/2023) 9 mL 3   Insulin  Pen Needle (BD PEN NEEDLE NANO 2ND GEN) 32G X 4 MM MISC Use as directed. (Patient not taking: Reported on 11/25/2023) 100 each 0   No facility-administered medications prior to visit.     ROS Review of Systems  Constitutional:  Positive for unexpected weight change. Negative for activity change and appetite change.  HENT:  Negative for sinus pressure and sore throat.   Respiratory:  Negative  for chest tightness, shortness of breath and wheezing.   Cardiovascular:  Negative for chest pain and palpitations.  Gastrointestinal:  Positive for diarrhea. Negative for abdominal distention, abdominal pain and constipation.  Genitourinary: Negative.   Musculoskeletal: Negative.   Neurological:  Positive for dizziness.  Psychiatric/Behavioral:  Negative for behavioral problems and dysphoric mood.     Objective:  BP 130/84   Pulse 83   Ht 5\' 8"  (1.727 m)   Wt 164 lb 3.2 oz (74.5 kg)   SpO2 99%   BMI 24.97 kg/m      11/25/2023   11:11 AM 07/31/2023    3:39 PM 12/13/2022   11:14 AM  BP/Weight  Systolic BP 130 122 109  Diastolic BP 84 68 68  Wt. (Lbs) 164.2 174.2 182.8  BMI 24.97 kg/m2 26.49 kg/m2 27.79 kg/m2    Orthostatic VS for the past 72 hrs (Last 3 readings):  Orthostatic BP Patient Position BP Location Orthostatic Pulse  11/25/23 1152 97/65 Standing Left Arm 82  11/25/23 1151 122/79 Sitting Left Arm 79  11/25/23 1150 128/74 Supine Left Arm 77     Physical Exam Constitutional:      Appearance: He is well-developed.  Cardiovascular:     Rate and Rhythm: Normal rate.     Heart sounds: Normal heart sounds. No murmur  heard. Pulmonary:     Effort: Pulmonary effort is normal.     Breath sounds: Normal breath sounds. No wheezing or rales.  Chest:     Chest wall: No tenderness.  Abdominal:     General: Bowel sounds are normal. There is no distension.     Palpations: Abdomen is soft. There is no mass.     Tenderness: There is no abdominal tenderness.  Musculoskeletal:        General: Normal range of motion.     Right lower leg: No edema.     Left lower leg: No edema.  Neurological:     Mental Status: He is alert and oriented to person, place, and time.  Psychiatric:        Mood and Affect: Mood normal.        Latest Ref Rng & Units 09/02/2023   11:18 AM 07/31/2023    4:21 PM 12/13/2022   12:01 PM  CMP  Glucose 70 - 99 mg/dL 85  952  82   BUN 8 - 27 mg/dL 15  27  17    Creatinine 0.76 - 1.27 mg/dL 8.41  3.24  4.01   Sodium 134 - 144 mmol/L 123  138  134   Potassium 3.5 - 5.2 mmol/L 5.5  5.7  6.0   Chloride 96 - 106 mmol/L 87  103  98   CO2 20 - 29 mmol/L 22  19  23    Calcium  8.6 - 10.2 mg/dL 9.4  9.4  02.7   Total Protein 6.0 - 8.5 g/dL  7.2    Total Bilirubin 0.0 - 1.2 mg/dL  0.4    Alkaline Phos 44 - 121 IU/L  65    AST 0 - 40 IU/L  13    ALT 0 - 44 IU/L  6      Lipid Panel     Component Value Date/Time   CHOL 122 08/09/2022 1101   TRIG 68 08/09/2022 1101   HDL 52 08/09/2022 1101   CHOLHDL 2.3 08/09/2022 1101   LDLCALC 56 08/09/2022 1101    CBC    Component Value Date/Time   WBC 8.8 12/13/2022 1201  WBC 5.5 02/05/2019 0346   RBC 3.83 (L) 12/13/2022 1201   RBC 2.77 (L) 02/05/2019 0346   HGB 12.9 (L) 12/13/2022 1201   HCT 39.4 12/13/2022 1201   PLT 237 12/13/2022 1201   MCV 103 (H) 12/13/2022 1201   MCH 33.7 (H) 12/13/2022 1201   MCH 31.8 02/05/2019 0346   MCHC 32.7 12/13/2022 1201   MCHC 31.8 02/05/2019 0346   RDW 13.1 12/13/2022 1201   LYMPHSABS 2.1 12/13/2022 1201   MONOABS 0.4 02/05/2019 0346   EOSABS 0.5 (H) 12/13/2022 1201   BASOSABS 0.1 12/13/2022 1201    Lab  Results  Component Value Date   HGBA1C 5.9 07/31/2023         1. Weight loss (Primary) Weight loss of 20 pounds in 1 year Decreased appetite contributing Will need to workup malignancy Screening for prostate cancer and colon cancer - CT ABDOMEN PELVIS W CONTRAST; Future - PSA, total and free - Cortisol  2. Dizziness He does have orthostatic hypotension which could explain this EKG is unchanged compared to previous EKGs or cardiac cause is unlikely Will need to evaluate for intracranial causes and endocrine causes - CT HEAD WO CONTRAST ( ); Future - EKG 12-Lead - Cortisol - Ambulatory referral to Physical Therapy  3. Hyperkalemia Last potassium was 5.5 and lisinopril  was discontinued Will check potassium again - Potassium - Cortisol  4. Screening for colon cancer - Cologuard  5. Screening for prostate cancer - PSA, total and free  6. Orthostatic hypotension Orthostatic vitals are positive Provided education on using compression stockings, changing positions slowly I will decrease his hydrochlorothiazide  dose from 50 mg to 25 mg  7. Poor urinary stream Needs to be screened for prostate cancer given weight loss - PSA, total and free  8. Hypertension associated with diabetes (HCC) Controlled but in the setting of orthostatic hypotension I am decreasing his HCTZ dose - hydrochlorothiazide  (HYDRODIURIL ) 25 MG tablet; Take 1 tablet (25 mg total) by mouth daily.  Dispense: 90 tablet; Refill: 1  9. Vertigo Refer to vestibular rehab  10. Type 2 diabetes mellitus with other neurologic complication, with long-term current use of insulin  (HCC) Diabetes is controlled and he has no hypoglycemia Uncontrolled neuropathy Gabapentin  restarted per patient request   Meds ordered this encounter  Medications   gabapentin  (NEURONTIN ) 300 MG capsule    Sig: Take 1 capsule (300 mg total) by mouth at bedtime.    Dispense:  90 capsule    Refill:  1   hydrochlorothiazide   (HYDRODIURIL ) 25 MG tablet    Sig: Take 1 tablet (25 mg total) by mouth daily.    Dispense:  90 tablet    Refill:  1    Discontinue 25 mg    Follow-up: Return for previously scheduled appointment.   Visit required 48 minutes of patient care including median intraservice time, reviewing previous notes and test results, coordination of care, counseling the patient in addition to management of chronic medical conditions.Time also spent ordering medications, investigations and documenting in the chart.  All questions were answered to the patient's satisfaction    Joaquin Mulberry, MD, FAAFP. [facility] [place], [place] [phone]   11/25/2023, 12:12 PM

## 2023-11-26 ENCOUNTER — Ambulatory Visit (INDEPENDENT_AMBULATORY_CARE_PROVIDER_SITE_OTHER): Admitting: Podiatry

## 2023-11-26 ENCOUNTER — Encounter: Payer: Self-pay | Admitting: Podiatry

## 2023-11-26 ENCOUNTER — Ambulatory Visit: Payer: Self-pay | Admitting: Family Medicine

## 2023-11-26 DIAGNOSIS — M79674 Pain in right toe(s): Secondary | ICD-10-CM | POA: Diagnosis not present

## 2023-11-26 DIAGNOSIS — M79675 Pain in left toe(s): Secondary | ICD-10-CM

## 2023-11-26 DIAGNOSIS — B351 Tinea unguium: Secondary | ICD-10-CM

## 2023-11-26 LAB — PSA, TOTAL AND FREE
PSA, Free Pct: 60 %
PSA, Free: 0.24 ng/mL
Prostate Specific Ag, Serum: 0.4 ng/mL (ref 0.0–4.0)

## 2023-11-26 LAB — POTASSIUM: Potassium: 4.1 mmol/L (ref 3.5–5.2)

## 2023-11-26 LAB — CORTISOL: Cortisol: 13.1 ug/dL (ref 6.2–19.4)

## 2023-11-26 NOTE — Progress Notes (Signed)
 This patient presents to the office with chief complaint of long thick painful nails.  Patient says the nails are painful walking and wearing shoes.  This patient is unable to self treat.  This patient is unable to trim his nails since she is unable to reach his nails.  She presents to the office for preventative foot care services. Patient is diabetic.  General Appearance  Alert, conversant and in no acute stress.  Vascular  Dorsalis pedis and posterior tibial  pulses are palpable  bilaterally.  Capillary return is within normal limits  bilaterally.   Neurologic  Senn-Weinstein monofilament wire test diminished   bilaterally. Muscle power within normal limits bilaterally.  Nails Thick disfigured discolored nails with subungual debris  from hallux to fifth toes bilaterally. No evidence of bacterial infection or drainage bilaterally.  Orthopedic  No limitations of motion  feet .  No crepitus or effusions noted.  No bony pathology or digital deformities noted.  DJD 1st MPJ right foot.  Skin  normotropic skin with no porokeratosis noted bilaterally.  No signs of infections or ulcers noted.     Onychomycosis  Nails  B/L.  Pain in right toes  Pain in left toes  Debridement of nails both feet followed trimming the nails with dremel tool.    RTC 3 months.   Ruffin Cotton DPM

## 2023-12-06 NOTE — Therapy (Incomplete)
 OUTPATIENT PHYSICAL THERAPY VESTIBULAR EVALUATION     Patient Name: Frederick Moody MRN: [id number] DOB:11-13-1955, 68 y.o., male Today's Date: 12/09/2023  END OF SESSION:  PT End of Session - 12/09/23 1230     Visit Number 1    Number of Visits 7    Date for PT Re-Evaluation 01/20/24    Authorization Type Medicare/Medicaid of [place]    PT Start Time 1103    PT Stop Time 1147    PT Time Calculation (min) 44 min    Equipment Utilized During Treatment Gait belt    Activity Tolerance Patient tolerated treatment well    Behavior During Therapy WFL for tasks assessed/performed          Past Medical History:  Diagnosis Date   Diabetes mellitus without complication (HCC)    ETOH abuse    Past Surgical History:  Procedure Laterality Date   APPENDECTOMY     IR THORACENTESIS ASP PLEURAL SPACE W/IMG GUIDE  02/06/2019   IRRIGATION AND DEBRIDEMENT ABSCESS N/A 01/13/2019   Procedure: IRRIGATION AND DEBRIDEMENT OF BACK ABSCESS;  Surgeon: Dareen Ebbing, MD;  Location: [facility] OR;  Service: General;  Laterality: N/A;   Patient Active Problem List   Diagnosis Date Noted   PICC (peripherally inserted central catheter) in place 02/18/2019   Abscess    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   Palliative care encounter    Pressure injury of skin 01/17/2019   Malnutrition of moderate degree 01/12/2019   MSSA bacteremia 01/08/2019    PCP: Joaquin Mulberry, MD  REFERRING PROVIDER: Joaquin Mulberry, MD   REFERRING DIAG: R42 (ICD-10-CM) - Dizziness  THERAPY DIAG:  Dizziness and giddiness  Unsteadiness on feet  ONSET DATE: 2 months  Rationale for Evaluation and Treatment: Rehabilitation  SUBJECTIVE:   SUBJECTIVE STATEMENT: Pt reports that he is not interested in exercises because he is 68 y/o and reports that he feels that his dizziness is tied to his BP meds because once when he ran out of his meds this problem went away. Reports that before his BP meds were lowered, he would pass out  every time he stood up. Reports cutting up his elbow, his back, and his eyebrow d/t falls caused by dizziness. Denies HAs or changes in dizziness since fall with head trauma. Reports chronic tinnitus. Denies infection/illness, vision changes/double vision, hearing loss, migraines. Reports that he has avoided going to the grocery store d/t wanting to avoid embarrassment of a fall. Reports some difficulty with his balance even when he is not dizzy. 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 No  PRECAUTIONS: Fall  RED FLAGS: None   WEIGHT BEARING RESTRICTIONS: No  FALLS: Has patient fallen in last 6 months? Yes. Number of falls 2-3 a day for the past 2 months  LIVING ENVIRONMENT: Lives with: brother Lives in: House/apartment Stairs: ramp entry; 1 story home Has following equipment at home: Environmental consultant - 2 wheeled, Wheelchair (manual), and Tour manager  PLOF: Independent and brother assists with floor transfers from a fall; pt is retired   PATIENT GOALS: stop falls from occurring   OBJECTIVE:  Note: Objective measures were completed at Evaluation unless otherwise noted.  DIAGNOSTIC FINDINGS: none recent  COGNITION: Overall cognitive status: Within functional limits for  tasks assessed   SENSATION: Pt reports my feet stay numb   POSTURE:  rounded shoulders and forward head  GAIT: Gait pattern: WFL Assistive device utilized: None Level of assistance: Modified independence  COORDINATION: Alternating pronation/supination: WNL B Alternating toe tap: WNL B Finger to nose: WNL B  FUNCTIONAL TESTS:     M-CTSIB  Condition 1: Firm Surface, EO 30 Sec, Normal Sway  Condition 2: Firm Surface, EC 7 Sec, Moderate Sway  Condition 3: Foam Surface, EO 30 Sec, Mild  Sway  Condition 4: Foam Surface, EC 10 Sec, Moderate Sway     PATIENT SURVEYS:  DHI 38/100   Vitals sitting during session: 95/28mmHg, 87bpm Vitals standing during session: 81/45mmHg, 82bpm (c/o mild dizziness)  VESTIBULAR ASSESSMENT   GENERAL OBSERVATION: pt reports owning bifocals but does not wear them often   OCULOMOTOR EXAM: Ocular Alignment: Cover/Uncover Test: WNL Cover/Cross Cover Test: WNL   Ocular ROM: No Limitations   Spontaneous Nystagmus: absent   Gaze-Induced Nystagmus: absent   Smooth Pursuits: intact, saccades, and few saccades vertically   Saccades: 2 saccades to R (normal for age)   Convergence/Divergence: 12 cm d/t L insufficiency    VESTIBULAR - OCULAR REFLEX:    Slow VOR: Normal and Comment: difficulty coordinating head turns   VOR Cancellation: Corrective Saccades to the R   Head-Impulse Test: HIT Right: positive HIT Left: positive     AUDITORY SCREEN:  Rinne Test B WNL    POSITIONAL TESTING:  NT d/t hx of positive orthostatics                                                                                                                              TREATMENT DATE: 12/09/23  PATIENT EDUCATION: Education details: exam findings and edu on benefits of vestibular and balance training for balance and reducing falls risk; encouraged pt to continue communicating with PCP about orthostasis, prognosis, POC Person educated: Patient Education method: Explanation Education comprehension: verbalized understanding  HOME EXERCISE PROGRAM: Not yet initiated   GOALS: Goals reviewed with patient? Yes  SHORT TERM GOALS: Target date: 12/30/2023  Patient to be independent with initial HEP. Baseline: HEP initiated Goal status: INITIAL    LONG TERM GOALS: Target date: 01/20/2024  Patient to be independent with advanced HEP. Baseline: Not yet initiated  Goal status: INITIAL  Patient to report tolerance for grocery shopping without dizziness or fear of  falling. Baseline: Unable Goal status: INITIAL  Patient will report 0/10 dizziness with bed mobility.  Baseline: Symptomatic with sit>stand today Goal status: INITIAL  Patient to demonstrate mild-moderate sway with M-CTSIB condition with eyes closed/foam surface in order to improve safety in environments with uneven surfaces and dim lighting. Baseline: 10 sec and moderate sway Goal status: INITIAL  Patient to score at least 23/30 on FGA in order to decrease risk of falls. Baseline: NT Goal status: INITIAL  Patient to report no falls in a 4 week period. Baseline: reports 2-3  falls/day Goal status: INITIAL  Patient to score at least 18 points less on DHI in order to meet MCID and improve functional outcomes.  Baseline: 38 Goal status: INITIAL    ASSESSMENT:  CLINICAL IMPRESSION:   Patient is a 68 y/o M presenting to OPPT with c/o dizziness and frequent falls for the past 2 months. Patient with positive orthostatics on appointment with PCP 11/25/23. Denies HAs or changes in dizziness since recent fall with head trauma. Reports chronic tinnitus. Denies infection/illness, vision changes/double vision, hearing loss, migraines. Patient today presenting with Imbalance with EC, low BP with further reduction in values upon standing with c/o dizziness, saccades with smooth pursuits, L convergence insufficiency, corrective saccades with R VOR cancellation, and positive B HIT. Would benefit from skilled PT services 1x/week for 6 weeks to address aforementioned impairments in order to optimize level of function.    OBJECTIVE IMPAIRMENTS: decreased activity tolerance, decreased balance, and dizziness.   ACTIVITY LIMITATIONS: carrying, lifting, bending, sitting, standing, squatting, sleeping, stairs, transfers, bed mobility, bathing, toileting, dressing, reach over head, hygiene/grooming, locomotion level, and caring for others  PARTICIPATION LIMITATIONS: meal prep, cleaning, laundry, driving,  shopping, community activity, yard work, and church  PERSONAL FACTORS: Age, Fitness, Past/current experiences, Time since onset of injury/illness/exacerbation, and 3+ comorbidities: DM, ETOH abuse, diabetic neuropathy, orthostasis  are also affecting patient's functional outcome.   REHAB POTENTIAL: Good  CLINICAL DECISION MAKING: Evolving/moderate complexity  EVALUATION COMPLEXITY: Moderate   PLAN:  PT FREQUENCY: 1x/week  PT DURATION: 6 weeks  PLANNED INTERVENTIONS: 97164- PT Re-evaluation, 97750- Physical Performance Testing, 97110-Therapeutic exercises, 97530- Therapeutic activity, [id number]- Neuromuscular re-education, 97535- Self Care, 28413- Manual therapy, [id number]- Gait training, [phone]- Canalith repositioning, [phone] (1-2 muscles), [id number] (3+ muscles)- Dry Needling, Patient/Family education, Balance training, Stair training, Taping, Vestibular training, DME instructions, Cryotherapy, and Moist heat  PLAN FOR NEXT SESSION: may want to do repeat orthostatic measures to see if PCP's medication change has resolved orthostasis; edu on techniques to manage orthostasis, FGA, initiate HEP for balance, VOR, safe transfers/transitions. Can initiate habituation once BP is stable    Thaddeus Filippo, [place], DPT 12/09/23 12:59 PM  [facility] at [facility] [address] [place], [address] [place], [place] [address] Phone # [phone] Fax # [phone]

## 2023-12-09 ENCOUNTER — Ambulatory Visit: Attending: Family Medicine | Admitting: Physical Therapy

## 2023-12-09 ENCOUNTER — Encounter: Payer: Self-pay | Admitting: Physical Therapy

## 2023-12-09 ENCOUNTER — Other Ambulatory Visit: Payer: Self-pay

## 2023-12-09 DIAGNOSIS — R42 Dizziness and giddiness: Secondary | ICD-10-CM | POA: Insufficient documentation

## 2023-12-09 DIAGNOSIS — R2681 Unsteadiness on feet: Secondary | ICD-10-CM | POA: Diagnosis present

## 2023-12-11 ENCOUNTER — Ambulatory Visit
Admission: RE | Admit: 2023-12-11 | Discharge: 2023-12-11 | Disposition: A | Discharge status: Home / Self Care | Source: Ambulatory Visit | Attending: Family Medicine | Admitting: Family Medicine

## 2023-12-11 DIAGNOSIS — R634 Abnormal weight loss: Secondary | ICD-10-CM

## 2023-12-11 DIAGNOSIS — R42 Dizziness and giddiness: Secondary | ICD-10-CM

## 2023-12-11 MED ORDER — IOPAMIDOL (ISOVUE-300) INJECTION 61%
100.0000 mL | Freq: Once | INTRAVENOUS | Status: AC | PRN
  Administered 2023-12-11: 100 mL via INTRAVENOUS

## 2023-12-19 ENCOUNTER — Telehealth: Payer: Self-pay

## 2023-12-19 ENCOUNTER — Ambulatory Visit

## 2023-12-19 ENCOUNTER — Other Ambulatory Visit: Payer: Self-pay

## 2023-12-19 DIAGNOSIS — R42 Dizziness and giddiness: Secondary | ICD-10-CM | POA: Diagnosis not present

## 2023-12-19 DIAGNOSIS — R2681 Unsteadiness on feet: Secondary | ICD-10-CM

## 2023-12-19 DIAGNOSIS — E1159 Type 2 diabetes mellitus with other circulatory complications: Secondary | ICD-10-CM

## 2023-12-19 MED ORDER — HYDROCHLOROTHIAZIDE 12.5 MG PO TABS
12.5000 mg | ORAL_TABLET | Freq: Every day | ORAL | 1 refills | Status: DC
Start: 2023-12-19 — End: 2024-01-27
  Filled 2023-12-19: qty 90, 90d supply, fill #0

## 2023-12-19 NOTE — Therapy (Signed)
 OUTPATIENT PHYSICAL THERAPY VESTIBULAR TREATMENT     Patient Name: Frederick Moody MRN: [id number] DOB:[birth year], 68 y.o., male Today's Date: 12/19/2023  END OF SESSION:  PT End of Session - 12/19/23 1007     Visit Number 2    Number of Visits 7    Date for PT Re-Evaluation 01/20/24    Authorization Type Medicare/Medicaid of [place]    PT Start Time 1015    PT Stop Time 1100    PT Time Calculation (min) 45 min    Equipment Utilized During Treatment Gait belt    Activity Tolerance Patient tolerated treatment well    Behavior During Therapy WFL for tasks assessed/performed          Past Medical History:  Diagnosis Date   Diabetes mellitus without complication (HCC)    ETOH abuse    Past Surgical History:  Procedure Laterality Date   APPENDECTOMY     IR THORACENTESIS ASP PLEURAL SPACE W/IMG GUIDE  02/06/2019   IRRIGATION AND DEBRIDEMENT ABSCESS N/A 01/13/2019   Procedure: IRRIGATION AND DEBRIDEMENT OF BACK ABSCESS;  Surgeon: Belinda Cough, MD;  Location: [facility] OR;  Service: General;  Laterality: N/A;   Patient Active Problem List   Diagnosis Date Noted   PICC (peripherally inserted central catheter) in place 02/18/2019   Abscess    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 No  PRECAUTIONS: Fall  RED FLAGS: None   WEIGHT BEARING RESTRICTIONS: No  FALLS: Has patient fallen in last 6 months? Yes. Number of falls 2-3 a day for the past 2 months  LIVING ENVIRONMENT: Lives with: brother Lives in: House/apartment Stairs: ramp entry; 1 story home Has following equipment at home: Environmental consultant - 2 wheeled, Wheelchair (manual), and Tour manager  PLOF: Independent and brother assists with floor transfers from a fall; pt is retired   PATIENT GOALS: stop falls from occurring   OBJECTIVE:   TODAY'S TREATMENT: 12/19/23 Activity Comments  Orthostatics Supine x 5 min: 144/85 mmHg, 66 bpm  Standing x 1 min: 92/65 mmHg, 79 bpm -lightheaded  Standing x 3 min: 88/57 mmHg, 78 bpm  Provided handout for Conservative (non-pharmacological) management strategies for orthostatic hypotension (OH)    Supine LE PRE, 20 reps -ankle pumps -quad set -hip add iso -SLR  Vitals standing x 1 min 90/63 mmHg, 78 bpm         PATIENT EDUCATION: Education details: orthostatic readings and info for mgmt. HEP initiation Person educated: Patient Education method: Explanation Education comprehension: verbalized understanding  HOME EXERCISE PROGRAM: Access Code: [email or web address] URL: [email or web address] Date: 12/19/2023 Prepared by: Kelly Chirstina Haan  Exercises - Supine Ankle Pumps  - 1 x daily - 7 x weekly - 3 sets - 10  reps - Supine Quadricep Sets  - 1 x daily - 7 x weekly - 3 sets - 10 reps - Supine Hip Adduction Isometric with Ball  - 1 x daily - 7 x weekly - 3 sets - 10 reps - Active Straight Leg Raise with Quad Set  - 1 x daily - 7 x weekly - 3 sets - 10 reps   Note: Objective measures were completed at Evaluation unless otherwise noted.  DIAGNOSTIC FINDINGS: none recent  COGNITION: Overall cognitive status: Within functional limits for tasks assessed   SENSATION: Pt reports my  feet stay numb   POSTURE:  rounded shoulders and forward head  GAIT: Gait pattern: WFL Assistive device utilized: None Level of assistance: Modified independence  COORDINATION: Alternating pronation/supination: WNL B Alternating toe tap: WNL B Finger to nose: WNL B  FUNCTIONAL TESTS:     M-CTSIB  Condition 1: Firm Surface, EO 30 Sec, Normal Sway  Condition 2: Firm Surface, EC 7 Sec, Moderate Sway  Condition 3: Foam Surface, EO 30 Sec, Mild Sway  Condition 4: Foam Surface, EC 10 Sec, Moderate Sway     PATIENT SURVEYS:  DHI 38/100   Vitals sitting during session: 95/83mmHg, 87bpm Vitals standing during session: 81/28mmHg, 82bpm (c/o mild dizziness)  VESTIBULAR ASSESSMENT   GENERAL OBSERVATION: pt reports owning bifocals but does not wear them often   OCULOMOTOR EXAM: Ocular Alignment: Cover/Uncover Test: WNL Cover/Cross Cover Test: WNL   Ocular ROM: No Limitations   Spontaneous Nystagmus: absent   Gaze-Induced Nystagmus: absent   Smooth Pursuits: intact, saccades, and few saccades vertically   Saccades: 2 saccades to R (normal for age)   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 Yes  SHORT TERM GOALS: Target date: 12/30/2023  Patient to be independent with initial HEP. Baseline: HEP initiated Goal status: INITIAL    LONG TERM GOALS: Target date: 01/20/2024  Patient to be independent with advanced HEP. Baseline: Not yet initiated  Goal status:  INITIAL  Patient to report tolerance for grocery shopping without dizziness or fear of falling. Baseline: Unable Goal status: INITIAL  Patient will report 0/10 dizziness with bed mobility.  Baseline: Symptomatic with sit>stand today Goal status: INITIAL  Patient to demonstrate mild-moderate sway with M-CTSIB condition with eyes closed/foam surface in order to improve safety in environments with uneven surfaces and dim lighting. Baseline:  10 sec and moderate sway Goal status: INITIAL  Patient to score at least 23/30 on FGA in order to decrease risk of falls. Baseline: NT Goal status: INITIAL  Patient to report no falls in a 4 week period. Baseline: reports 2-3 falls/day Goal status: INITIAL  Patient to score at least 18 points less on DHI in order to meet MCID and improve functional outcomes.  Baseline: 38 Goal status: INITIAL    ASSESSMENT:  CLINICAL IMPRESSION: Orthostatic hypotension assessment exhibiting significant drop to systolic and diastolic pressure from supine to standing with pt report of lightheadedness/unsteadiness with change of position. Instructed in supine exercise for means of general conditioning as well as utilizing prior to change of position to help offset.  Continued sessions to progress program details to address symptoms and improve activity tolerance.   OBJECTIVE IMPAIRMENTS: decreased activity tolerance, decreased balance, and dizziness.   ACTIVITY LIMITATIONS: carrying, lifting, bending, sitting, standing, squatting, sleeping, stairs, transfers, bed mobility, bathing, toileting, dressing, reach over head, hygiene/grooming, locomotion level, and caring for others  PARTICIPATION LIMITATIONS: meal prep, cleaning, laundry, driving, shopping, community activity, yard work, and church  PERSONAL FACTORS: Age, Fitness, Past/current experiences, Time since onset of injury/illness/exacerbation, and 3+ comorbidities: DM, ETOH abuse, diabetic neuropathy,  orthostasis  are also affecting patient's functional outcome.   REHAB POTENTIAL: Good  CLINICAL DECISION MAKING: Evolving/moderate complexity  EVALUATION COMPLEXITY: Moderate   PLAN:  PT FREQUENCY: 1x/week  PT DURATION: 6 weeks  PLANNED INTERVENTIONS: 97164- PT Re-evaluation, 97750- Physical Performance Testing, 97110-Therapeutic exercises, 97530- Therapeutic activity, [id number]- Neuromuscular re-education, 97535- Self Care, 02859- Manual therapy, [id number]- Gait training, [phone]- Canalith repositioning, [phone] (1-2 muscles), [phone] (3+ muscles)- Dry Needling, Patient/Family education, Balance training, Stair training, Taping, Vestibular training, DME instructions, Cryotherapy, and Moist heat  PLAN FOR NEXT SESSION: may want to do repeat orthostatic measures to see if PCP's medication change has resolved orthostasis; edu on techniques to manage orthostasis, FGA, initiate HEP for balance, VOR, safe transfers/transitions. Can initiate habituation once BP is stable   10:58 AM, 12/19/23 M. Kelly Seba Madole, PT, DPT Physical Therapist-  Office Number: [phone]

## 2023-12-19 NOTE — Telephone Encounter (Signed)
 Hello Dr. Newlin,  The attached patient has recently started Physical Therapy at our clinic with chief complaint of dizziness/lightheadedness.  I am aware that ya'll have been addressing his BP issues and I just wanted to share his readings from today's sessions for f/u as needed.  Supine x 5 min: 144/85 mmHg, 66 bpm  Standing x 1 min: 92/65 mmHg, 79 bpm -lightheaded  Standing x 3 min: 88/57 mmHg, 78 bpm -lightheaded  Thank you for your attention in this matter.  11:01 AM, 12/19/23 M. Kelly Alberto Pina, PT, DPT Physical Therapist- [place] Office Number: [phone]

## 2023-12-19 NOTE — Addendum Note (Signed)
 Addended by: Crysten Kaman on: 12/19/2023 03:41 PM   Modules accepted: Orders

## 2023-12-19 NOTE — Telephone Encounter (Signed)
 Thank you for the heads up.  He does have orthostatic hypotension and I decreased his hydrochlorothiazide  from 50 mg to 25 mg.  I would like him to further decrease this from 25 mg to 12.5 mg (half of his 25 mg tablet).  I sent a new prescription for 12.5 mg to his pharmacy.  Thank you.

## 2023-12-25 ENCOUNTER — Encounter: Payer: Self-pay | Admitting: Physical Therapy

## 2023-12-25 ENCOUNTER — Ambulatory Visit: Attending: Family Medicine | Admitting: Physical Therapy

## 2023-12-25 DIAGNOSIS — R42 Dizziness and giddiness: Secondary | ICD-10-CM | POA: Insufficient documentation

## 2023-12-25 DIAGNOSIS — R2681 Unsteadiness on feet: Secondary | ICD-10-CM | POA: Insufficient documentation

## 2023-12-25 NOTE — Therapy (Signed)
 OUTPATIENT PHYSICAL THERAPY VESTIBULAR TREATMENT     Patient Name: Frederick Moody MRN: [id number] DOB:1956-02-25, 68 y.o., male Today's Date: 12/25/2023  END OF SESSION:  PT End of Session - 12/25/23 1217     Visit Number 3    Number of Visits 7    Date for PT Re-Evaluation 01/20/24    Authorization Type Medicare/Medicaid of [place]    PT Start Time 1230    PT Stop Time 1310    PT Time Calculation (min) 40 min    Equipment Utilized During Treatment Gait belt    Activity Tolerance Patient tolerated treatment well    Behavior During Therapy WFL for tasks assessed/performed          Past Medical History:  Diagnosis Date   Diabetes mellitus without complication (HCC)    ETOH abuse    Past Surgical History:  Procedure Laterality Date   APPENDECTOMY     IR THORACENTESIS ASP PLEURAL SPACE W/IMG GUIDE  02/06/2019   IRRIGATION AND DEBRIDEMENT ABSCESS N/A 01/13/2019   Procedure: IRRIGATION AND DEBRIDEMENT OF BACK ABSCESS;  Surgeon: Belinda Cough, MD;  Location: [facility] OR;  Service: General;  Laterality: N/A;   Patient Active Problem List   Diagnosis Date Noted   PICC (peripherally inserted central catheter) in place 02/18/2019   Abscess    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  I know what pill is doing it-it's not my BP medicine.  My daughter is aware and I'm going to let my doctor know.  No falls since last visit.   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 No  PRECAUTIONS: Fall  RED FLAGS: None   WEIGHT BEARING RESTRICTIONS: No  FALLS: Has patient fallen in last 6 months? Yes. Number of falls 2-3 a day for the past 2 months  LIVING ENVIRONMENT: Lives with: brother Lives in: House/apartment Stairs: ramp entry; 1 story home Has following equipment at home: Environmental consultant - 2 wheeled, Wheelchair (manual), and Tour manager  PLOF: Independent and brother assists with floor transfers from a fall; pt is retired   PATIENT GOALS: stop falls from occurring   OBJECTIVE:    TODAY'S TREATMENT: 12/25/2023 Activity Comments  Vitals seated  124/79, HR 76 bpm  Vitals standing after 1 min 97/63, HR 80 bpm; mild dizziness  Seated BLE strength: Ankle pumps Seated march LAQ Hip adduction ball squeezes 20 reps  FGA:  18/30 Scores <22/30 indicate increased fall risk  Along counter: Sidestepping x 1 min Forward/back walking x 1 min Alt step taps x 10 Light UE support       [facility] PT Assessment - 12/25/23 1246       Functional Gait  Assessment   Gait assessed  Yes    Gait Level Surface Walks 20 ft, slow speed, abnormal gait pattern, evidence for imbalance or deviates 10-15 in outside of the 12 in  walkway width. Requires more than 7 sec to ambulate 20 ft.   8.64   Change in Gait Speed Able to change speed, demonstrates mild gait deviations, deviates 6-10 in outside of the 12 in walkway width, or no gait deviations, unable to achieve a major change in velocity, or uses a change in velocity, or uses an assistive device.    Gait with Horizontal Head Turns Performs head turns smoothly with slight change in gait velocity (eg, minor disruption to smooth gait path), deviates 6-10 in outside 12 in walkway width, or uses an assistive device.    Gait with Vertical Head Turns Performs task with slight change in gait velocity (eg, minor disruption to smooth gait  path), deviates 6 - 10 in outside 12 in walkway width or uses assistive device    Gait and Pivot Turn Pivot turns safely within 3 sec and stops quickly with no loss of balance.    Step Over Obstacle Is able to step over one shoe box (4.5 in total height) without changing gait speed. No evidence of imbalance.    Gait with Narrow Base of Support Ambulates less than 4 steps heel to toe or cannot perform without assistance.    Gait with Eyes Closed Walks 20 ft, slow speed, abnormal gait pattern, evidence for imbalance, deviates 10-15 in outside 12 in walkway width. Requires more than 9 sec to ambulate 20 ft.   12.28 sec   Ambulating Backwards Walks 20 ft, uses assistive device, slower speed, mild gait deviations, deviates 6-10 in outside 12 in walkway width.    Steps Alternating feet, no rail.    Total Score 18    FGA comment: Scores <22/30 indicate increased fall risk          HOME EXERCISE PROGRAM: Access Code: [email or web address] URL: [email or web address] Date: 12/25/2023 Prepared by: [facility] - Outpatient  Rehab - [facility]  Exercises - Supine Ankle Pumps  - 1 x daily - 7 x weekly - 3 sets - 10 reps - Supine Quadricep Sets  - 1 x daily - 7 x weekly - 3 sets - 10 reps - Supine Hip Adduction Isometric with Ball  - 1 x daily - 7 x weekly - 3 sets - 10 reps - Active Straight Leg Raise with Quad Set  - 1 x daily - 7 x weekly - 3 sets - 10 reps - Side Stepping with Counter Support  - 1 x daily - 7 x weekly - 1 sets - 3 reps - 1-2 min hold - Backward Walking with Counter Support  - 1 x daily - 7 x weekly - 1 sets - 3-5 reps - 1-2 min  hold - Alternating Step Taps with Counter Support  - 1 x daily - 7 x weekly - 3 sets - 10 reps   PATIENT EDUCATION: Education details: HEP updates Person educated: Patient Education method: Explanation Education comprehension: verbalized understanding ----------------------------------------------------------  Note: Objective measures were  completed at Evaluation unless otherwise noted.  DIAGNOSTIC FINDINGS: none recent  COGNITION: Overall cognitive status: Within functional limits for tasks assessed   SENSATION: Pt reports my feet stay numb   POSTURE:  rounded shoulders and forward head  GAIT: Gait pattern: WFL Assistive device utilized: None Level of assistance: Modified independence  COORDINATION: Alternating pronation/supination: WNL B Alternating toe tap: WNL B Finger to nose: WNL B  FUNCTIONAL TESTS:     M-CTSIB  Condition 1: Firm Surface, EO 30 Sec, Normal Sway  Condition 2: Firm Surface, EC  7 Sec, Moderate Sway  Condition 3: Foam Surface, EO 30 Sec, Mild Sway  Condition 4: Foam Surface, EC 10 Sec, Moderate Sway     PATIENT SURVEYS:  DHI 38/100   Vitals sitting during session: 95/44mmHg, 87bpm Vitals standing during session: 81/23mmHg, 82bpm (c/o mild dizziness)  VESTIBULAR ASSESSMENT   GENERAL OBSERVATION: pt reports owning bifocals but does not wear them often   OCULOMOTOR EXAM: Ocular Alignment: Cover/Uncover Test: WNL Cover/Cross Cover Test: WNL   Ocular ROM: No Limitations   Spontaneous Nystagmus: absent   Gaze-Induced Nystagmus: absent   Smooth Pursuits: intact, saccades, and few saccades vertically   Saccades: 2 saccades to R (normal for age)   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 Yes  SHORT TERM GOALS: Target date: 12/30/2023  Patient to be independent with  initial HEP. Baseline: HEP initiated Goal status: INITIAL    LONG TERM GOALS: Target date: 01/20/2024  Patient to be independent with advanced HEP. Baseline: Not yet initiated  Goal status: INITIAL  Patient to report tolerance for grocery shopping without dizziness or fear of falling. Baseline: Unable Goal status: INITIAL  Patient will report 0/10 dizziness with bed mobility.  Baseline: Symptomatic with sit>stand today Goal status: INITIAL  Patient to demonstrate mild-moderate sway with M-CTSIB condition with eyes closed/foam surface in order to improve safety in environments with uneven surfaces and dim lighting. Baseline: 10 sec and moderate sway Goal status: INITIAL  Patient to score at least 23/30 on FGA in order to decrease risk of falls. Baseline: NT Goal status: INITIAL  Patient to report no falls in a 4 week period. Baseline: reports 2-3 falls/day Goal status: INITIAL  Patient to score at least 18 points less on DHI in order to meet MCID and improve functional outcomes.  Baseline: 38 Goal status: INITIAL    ASSESSMENT:  CLINICAL IMPRESSION: Pt presents today with no new complaints.  He does report overall less dizziness, and feels one of his medications (not the BP medication) is contributing to dizziness.  Encouraged pt to speak to MD about this since pt is no longer taking this medication (he is unsure at PT session which medication it is).  With seated and standing BP measures, he BP does drop, but he does not c/o significant dizziness.  Assessed FGA today and pt at fall risk per FGA score of  18/30.  Skilled PT session focused on reviewing and updating HEP. He tolerates standing activities without c/o dizziness. Pt will continue to benefit from skilled PT towards goals for improved functional mobility and decreased fall risk.   OBJECTIVE IMPAIRMENTS: decreased activity tolerance, decreased balance, and dizziness.   ACTIVITY LIMITATIONS: carrying, lifting,  bending, sitting, standing, squatting, sleeping, stairs, transfers, bed mobility, bathing, toileting, dressing, reach over head, hygiene/grooming, locomotion level, and caring for others  PARTICIPATION LIMITATIONS: meal prep, cleaning, laundry, driving, shopping, community activity, yard work, and church  PERSONAL FACTORS: Age, Fitness, Past/current experiences, Time since onset of injury/illness/exacerbation, and 3+ comorbidities: DM, ETOH abuse, diabetic neuropathy, orthostasis  are also affecting patient's functional outcome.   REHAB POTENTIAL: Good  CLINICAL DECISION MAKING: Evolving/moderate complexity  EVALUATION COMPLEXITY: Moderate   PLAN:  PT FREQUENCY: 1x/week  PT DURATION: 6 weeks  PLANNED INTERVENTIONS: 97164- PT Re-evaluation, 97750- Physical Performance Testing, 97110-Therapeutic exercises, 97530- Therapeutic activity, [id number]- Neuromuscular re-education, 97535- Self Care, 02859- Manual therapy, [id number]- Gait training, [phone]- Canalith repositioning, 79439 (1-2 muscles), 20561 (3+ muscles)- Dry Needling, Patient/Family education, Balance training, Stair training, Taping, Vestibular training, DME instructions, Cryotherapy, and Moist heat  PLAN FOR NEXT SESSION: Check BP measures (orthostatic);  Review HEP for balance, VOR, safe transfers/transitions. Can initiate habituation once BP is stable   Greig Anon, PT 12/25/23 1:15 PM Phone: [phone] Fax: [phone]  [facility] at [facility] [address], [address] [place], [place] [address] Phone # [phone] Fax # [phone]

## 2024-01-01 ENCOUNTER — Ambulatory Visit

## 2024-01-01 DIAGNOSIS — R42 Dizziness and giddiness: Secondary | ICD-10-CM

## 2024-01-01 DIAGNOSIS — R2681 Unsteadiness on feet: Secondary | ICD-10-CM

## 2024-01-01 NOTE — Therapy (Signed)
 OUTPATIENT PHYSICAL THERAPY VESTIBULAR TREATMENT     Patient Name: Frederick Moody MRN: [id number] DOB:[birth year], 68 y.o., male Today's Date: 01/01/2024  END OF SESSION:  PT End of Session - 01/01/24 1148     Visit Number 4    Number of Visits 7    Date for PT Re-Evaluation 01/20/24    Authorization Type Medicare/Medicaid of     PT Start Time 1147    PT Stop Time 1230    PT Time Calculation (min) 43 min    Equipment Utilized During Treatment Gait belt    Activity Tolerance Patient tolerated treatment well    Behavior During Therapy WFL for tasks assessed/performed          Past Medical History:  Diagnosis Date   Diabetes mellitus without complication (HCC)    ETOH abuse    Past Surgical History:  Procedure Laterality Date   APPENDECTOMY     IR THORACENTESIS ASP PLEURAL SPACE W/IMG GUIDE  02/06/2019   IRRIGATION AND DEBRIDEMENT ABSCESS N/A 01/13/2019   Procedure: IRRIGATION AND DEBRIDEMENT OF BACK ABSCESS;  Surgeon: Belinda Cough, MD;  Location: [facility] OR;  Service: General;  Laterality: N/A;   Patient Active Problem List   Diagnosis Date Noted   PICC (peripherally inserted central catheter) in place 02/18/2019   Abscess    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   Palliative care encounter    Pressure injury of skin 01/17/2019   Malnutrition of moderate degree 01/12/2019   MSSA bacteremia 01/08/2019    PCP: Delbert Clam, MD  REFERRING PROVIDER: Delbert Clam, MD   REFERRING DIAG: R42 (ICD-10-CM) - Dizziness  THERAPY DIAG:  Dizziness and giddiness  Unsteadiness on feet  ONSET DATE: 2 months  Rationale for Evaluation and Treatment: Rehabilitation  SUBJECTIVE:   SUBJECTIVE STATEMENT: Reports he stopped all medications except DM rx and gradually introduced meds to normal until he started the amLODipine  and began to experience the lightheadedness and dizziness again. He has reached out to MD to provide details. 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 No  PRECAUTIONS: Fall  RED FLAGS: None   WEIGHT BEARING RESTRICTIONS: No  FALLS: Has patient fallen in last 6 months? Yes. Number of falls 2-3 a day for the past 2 months  LIVING ENVIRONMENT: Lives with: brother Lives in: House/apartment Stairs: ramp entry; 1 story home Has following equipment at home: Environmental consultant - 2 wheeled, Wheelchair (manual), and Tour manager  PLOF: Independent and brother assists with floor transfers from a fall; pt is retired   PATIENT GOALS: stop falls from occurring   OBJECTIVE:   TODAY'S TREATMENT: 01/01/24 Activity Comments  Vitals: Seated LUE: 135/83 mmHg, 70 bpm Standing x 1 min: 83/56 mmHg, 71 bpm  Pt education Discussion regarding best exercises interventions/options regarding peripheral neuropathy, as well as benefit to [facility]  Seated upper/lower body resistance training Seated row: 2x10, 20# Seated lat row: 2x10, 20# Seated hamstring curls: 2x10, 15# LAQ: 2x10, 10#               TODAY'S TREATMENT: 12/25/2023 Activity Comments  Vitals seated  124/79, HR 76 bpm  Vitals standing after 1 min 97/63, HR 80 bpm; mild dizziness  Seated BLE strength: Ankle pumps Seated march LAQ Hip adduction ball squeezes 20 reps  FGA:  18/30 Scores <22/30 indicate increased fall risk  Along counter: Sidestepping x 1 min Forward/back walking x 1 min Alt step taps x 10 Light UE support         HOME EXERCISE PROGRAM: Access Code: [email or web address] URL: [email or web address] Date: 12/25/2023 Prepared by: [facility] - Outpatient  Rehab - [facility]  Exercises - Supine Ankle Pumps  - 1 x daily - 7 x weekly - 3 sets - 10 reps - Supine Quadricep Sets  - 1 x daily - 7 x weekly - 3 sets - 10 reps - Supine Hip Adduction Isometric with Ball   - 1 x daily - 7 x weekly - 3 sets - 10 reps - Active Straight Leg Raise with Quad Set  - 1 x daily - 7 x weekly - 3 sets - 10 reps - Side Stepping with Counter Support  - 1 x daily - 7 x weekly - 1 sets - 3 reps - 1-2 min hold - Backward Walking with Counter Support  - 1 x daily - 7 x weekly - 1 sets - 3-5 reps - 1-2 min  hold - Alternating Step Taps with Counter Support  - 1 x daily - 7 x weekly - 3 sets - 10 reps   PATIENT EDUCATION: Education details: HEP updates Person educated: Patient Education method: Explanation Education comprehension: verbalized understanding ----------------------------------------------------------  Note: Objective measures were completed at Evaluation unless otherwise noted.  DIAGNOSTIC FINDINGS: none recent  COGNITION: Overall cognitive status: Within functional limits for tasks assessed   SENSATION: Pt reports my feet stay numb   POSTURE:  rounded shoulders and forward head  GAIT: Gait pattern: WFL Assistive device utilized: None Level of assistance: Modified independence  COORDINATION: Alternating pronation/supination: WNL B Alternating toe tap: WNL B Finger to nose: WNL B  FUNCTIONAL TESTS:     M-CTSIB  Condition 1: Firm Surface, EO 30 Sec, Normal Sway  Condition 2: Firm Surface, EC 7 Sec, Moderate Sway  Condition 3: Foam Surface, EO 30 Sec, Mild Sway  Condition 4: Foam Surface, EC 10 Sec, Moderate Sway     PATIENT SURVEYS:  DHI 38/100   Vitals sitting during session: 95/87mmHg, 87bpm Vitals standing during session: 81/44mmHg, 82bpm (c/o mild dizziness)  VESTIBULAR ASSESSMENT   GENERAL OBSERVATION: pt reports owning bifocals but does not wear them often   OCULOMOTOR EXAM: Ocular Alignment: Cover/Uncover Test: WNL Cover/Cross Cover Test: WNL   Ocular ROM: No Limitations   Spontaneous Nystagmus: absent   Gaze-Induced Nystagmus: absent   Smooth Pursuits: intact, saccades, and few saccades vertically   Saccades: 2  saccades to R (normal for age)   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 Yes  SHORT TERM GOALS: Target date: 12/30/2023  Patient to be independent with initial HEP. Baseline: HEP initiated Goal status: INITIAL    LONG TERM GOALS: Target date: 01/20/2024  Patient to be independent with advanced HEP. Baseline: Not yet initiated  Goal status: INITIAL  Patient to report tolerance for grocery shopping without dizziness or fear of falling. Baseline: Unable Goal status: INITIAL  Patient will report 0/10 dizziness with bed mobility.  Baseline: Symptomatic with sit>stand today Goal status: INITIAL  Patient to demonstrate mild-moderate sway with M-CTSIB condition with eyes closed/foam surface in order to improve safety in environments with uneven surfaces and dim lighting. Baseline: 10 sec and moderate sway Goal status: INITIAL  Patient to score at least 23/30 on FGA in order to decrease risk of falls. Baseline: NT Goal status: INITIAL  Patient to report no falls in a 4 week period. Baseline: reports 2-3 falls/day Goal status: INITIAL  Patient to score at least 18 points less on DHI in order to meet MCID and improve functional outcomes.  Baseline: 38 Goal status: INITIAL    ASSESSMENT:  CLINICAL IMPRESSION: Pt reports recent medication elimination and reintroduction and notes one of his BP medications seems to have been the culprit for causing his issues of  lightheaded/dizziness.  Vitals assessed at outset and does demo ongoing drop in BP when transitioning from sit to stand.  Pt expresses interest in strength training routine and provided education and description of benefits to resistance training with priority to compound movemenst for best carryover and multiditude of physiologic benfits. Performed various compound and isolation exercises for upper/lower body to imrpove strength/power and for analogy to gym equipment with good performance throughout.   OBJECTIVE IMPAIRMENTS: decreased activity tolerance, decreased balance, and dizziness.   ACTIVITY LIMITATIONS: carrying, lifting, bending, sitting, standing, squatting, sleeping, stairs, transfers, bed mobility, bathing, toileting, dressing, reach over head, hygiene/grooming, locomotion level, and caring for others  PARTICIPATION LIMITATIONS: meal prep, cleaning, laundry, driving, shopping, community activity, yard work, and church  PERSONAL FACTORS: Age, Fitness, Past/current experiences, Time since onset of injury/illness/exacerbation, and 3+ comorbidities: DM, ETOH abuse, diabetic neuropathy, orthostasis  are also affecting patient's functional outcome.   REHAB POTENTIAL: Good  CLINICAL DECISION MAKING: Evolving/moderate complexity  EVALUATION COMPLEXITY: Moderate   PLAN:  PT FREQUENCY: 1x/week  PT DURATION: 6 weeks  PLANNED INTERVENTIONS: 97164- PT Re-evaluation, 97750- Physical Performance Testing, 97110-Therapeutic exercises, 97530- Therapeutic activity, [id number]- Neuromuscular re-education, 97535- Self Care, [id number]- Manual therapy, [id number]- Gait training, [phone]- Canalith repositioning, 79439 (1-2 muscles), [id number] (3+ muscles)- Dry Needling, Patient/Family education, Balance training, Stair training, Taping, Vestibular training, DME instructions, Cryotherapy, and Moist heat  PLAN FOR NEXT SESSION: Check BP measures (orthostatic);  Review HEP for balance, VOR, safe transfers/transitions. Can  initiate habituation once BP is stable   12:42 PM, 01/01/24 M. Kelly Shadasia [place], PT, DPT Physical Therapist- [place] Office Number: [phone]

## 2024-01-08 ENCOUNTER — Ambulatory Visit: Admitting: Physical Therapy

## 2024-01-14 NOTE — Therapy (Incomplete)
 OUTPATIENT PHYSICAL THERAPY VESTIBULAR TREATMENT     Patient Name: Frederick Moody MRN: [id number] DOB:May 01, 1956, 68 y.o., male Today's Date: 01/14/2024  END OF SESSION:    Past Medical History:  Diagnosis Date   Diabetes mellitus without complication (HCC)    ETOH abuse    Past Surgical History:  Procedure Laterality Date   APPENDECTOMY     IR THORACENTESIS ASP PLEURAL SPACE W/IMG GUIDE  02/06/2019   IRRIGATION AND DEBRIDEMENT ABSCESS N/A 01/13/2019   Procedure: IRRIGATION AND DEBRIDEMENT OF BACK ABSCESS;  Surgeon: Belinda Cough, MD;  Location: [facility] OR;  Service: General;  Laterality: N/A;   Patient Active Problem List   Diagnosis Date Noted   PICC (peripherally inserted central catheter) in place 02/18/2019   Abscess    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  ONSET DATE: 2 months  Rationale for Evaluation and Treatment: Rehabilitation  SUBJECTIVE:   SUBJECTIVE STATEMENT: Reports he stopped all medications except DM rx and gradually introduced meds to normal until he started the amLODipine  and began to experience the lightheadedness and dizziness again. He has reached out to MD to provide details. 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 No  PRECAUTIONS: Fall  RED FLAGS: None   WEIGHT BEARING RESTRICTIONS: No  FALLS: Has patient fallen in last 6 months? Yes. Number of falls 2-3 a day for the past 2 months  LIVING ENVIRONMENT: Lives with: brother Lives  in: House/apartment Stairs: ramp entry; 1 story home Has following equipment at home: Vannie - 2 wheeled, Wheelchair (manual), and Tour manager  PLOF: Independent and brother assists with floor transfers from a fall; pt is retired   PATIENT GOALS: stop falls from occurring   OBJECTIVE:      TODAY'S TREATMENT: 01/15/24 Activity Comments                         HOME EXERCISE PROGRAM: Access Code: [email or web address] URL: [email or web address] Date: 12/25/2023 Prepared by: [facility] - Outpatient  Rehab - [facility]  Exercises - Supine Ankle Pumps  - 1 x daily - 7 x weekly - 3 sets - 10 reps - Supine Quadricep Sets  - 1 x daily - 7 x weekly - 3 sets - 10 reps - Supine Hip Adduction Isometric with Ball  - 1 x daily - 7 x weekly - 3 sets - 10 reps - Active Straight Leg Raise with Quad Set  - 1 x daily - 7 x weekly - 3 sets - 10 reps - Side Stepping with Counter Support  - 1 x daily - 7 x weekly - 1 sets - 3 reps - 1-2 min hold - Backward Walking with Counter Support  - 1 x daily - 7 x weekly - 1 sets - 3-5 reps - 1-2 min  hold - Alternating Step Taps with Counter Support  - 1 x daily - 7 x weekly - 3 sets - 10 reps    ----------------------------------------------------------  Note:  Objective measures were completed at Evaluation unless otherwise noted.  DIAGNOSTIC FINDINGS: none recent  COGNITION: Overall cognitive status: Within functional limits for tasks assessed   SENSATION: Pt reports my feet stay numb   POSTURE:  rounded shoulders and forward head  GAIT: Gait pattern: WFL Assistive device utilized: None Level of assistance: Modified independence  COORDINATION: Alternating pronation/supination: WNL B Alternating toe tap: WNL B Finger to nose: WNL B  FUNCTIONAL TESTS:     M-CTSIB  Condition 1: Firm Surface, EO 30 Sec, Normal Sway  Condition 2: Firm Surface, EC 7 Sec, Moderate Sway  Condition 3: Foam Surface, EO 30 Sec, Mild Sway   Condition 4: Foam Surface, EC 10 Sec, Moderate Sway     PATIENT SURVEYS:  DHI 38/100   Vitals sitting during session: 95/73mmHg, 87bpm Vitals standing during session: 81/60mmHg, 82bpm (c/o mild dizziness)  VESTIBULAR ASSESSMENT   GENERAL OBSERVATION: pt reports owning bifocals but does not wear them often   OCULOMOTOR EXAM: Ocular Alignment: Cover/Uncover Test: WNL Cover/Cross Cover Test: WNL   Ocular ROM: No Limitations   Spontaneous Nystagmus: absent   Gaze-Induced Nystagmus: absent   Smooth Pursuits: intact, saccades, and few saccades vertically   Saccades: 2 saccades to R (normal for age)   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 Yes  SHORT TERM GOALS: Target date: 12/30/2023  Patient to be independent with initial HEP. Baseline: HEP initiated Goal status: INITIAL    LONG TERM GOALS: Target date: 01/20/2024  Patient to be independent with advanced HEP. Baseline: Not yet initiated  Goal status: INITIAL  Patient to report tolerance for grocery shopping without dizziness or fear of falling. Baseline: Unable Goal status: INITIAL  Patient will report 0/10 dizziness with bed mobility.  Baseline: Symptomatic with sit>stand today Goal status: INITIAL  Patient to demonstrate mild-moderate sway with M-CTSIB condition with eyes closed/foam surface in order to improve safety in environments with uneven surfaces and dim lighting. Baseline: 10 sec and moderate  sway Goal status: INITIAL  Patient to score at least 23/30 on FGA in order to decrease risk of falls. Baseline: NT Goal status: INITIAL  Patient to report no falls in a 4 week period. Baseline: reports 2-3 falls/day Goal status: INITIAL  Patient to score at least 18 points less on DHI in order to meet MCID and improve functional outcomes.  Baseline: 38 Goal status: INITIAL    ASSESSMENT:  CLINICAL IMPRESSION: Pt reports recent medication elimination and reintroduction and notes one of his BP medications seems to have been the culprit for causing his issues of lightheaded/dizziness.  Vitals assessed at outset and does demo ongoing drop in BP when transitioning from sit to stand.  Pt expresses interest in strength training routine and provided education and description of benefits to resistance training with priority to compound movemenst for best carryover and multiditude of physiologic benfits. Performed various compound and isolation exercises for upper/lower body to imrpove strength/power and for analogy to gym equipment with good performance throughout.   OBJECTIVE IMPAIRMENTS: decreased activity tolerance, decreased balance, and dizziness.   ACTIVITY LIMITATIONS: carrying, lifting, bending, sitting, standing, squatting, sleeping, stairs, transfers, bed mobility, bathing, toileting, dressing, reach over head, hygiene/grooming, locomotion level, and caring for others  PARTICIPATION LIMITATIONS: meal prep, cleaning, laundry, driving, shopping, community activity, yard work, and church  PERSONAL FACTORS: Age, Fitness, Past/current experiences, Time since onset of injury/illness/exacerbation, and 3+ comorbidities: DM, ETOH abuse, diabetic neuropathy, orthostasis  are also affecting patient's functional outcome.   REHAB POTENTIAL: Good  CLINICAL DECISION MAKING: Evolving/moderate complexity  EVALUATION COMPLEXITY: Moderate   PLAN:  PT FREQUENCY: 1x/week  PT DURATION: 6  weeks  PLANNED INTERVENTIONS: 97164- PT Re-evaluation, 97750- Physical Performance Testing, 97110-Therapeutic exercises, 97530- Therapeutic activity, [id number]- Neuromuscular re-education, 97535- Self Care, 02859- Manual therapy, [id number]- Gait training, [phone]- Canalith repositioning, 79439 (1-2 muscles), [phone] (3+ muscles)- Dry Needling, Patient/Family education, Balance training, Stair training, Taping, Vestibular training, DME instructions, Cryotherapy, and Moist heat  PLAN FOR NEXT SESSION: Check BP measures (orthostatic);  Review HEP for balance, VOR, safe transfers/transitions. Can initiate habituation once BP is stable   11:26 AM, 01/14/24 M. Kelly Halpin, PT, DPT Physical Therapist- [place] Office Number: [phone]

## 2024-01-15 ENCOUNTER — Other Ambulatory Visit (HOSPITAL_COMMUNITY): Payer: Self-pay

## 2024-01-15 ENCOUNTER — Other Ambulatory Visit: Payer: Self-pay | Admitting: Family Medicine

## 2024-01-15 ENCOUNTER — Other Ambulatory Visit: Payer: Self-pay

## 2024-01-15 ENCOUNTER — Ambulatory Visit: Admitting: Physical Therapy

## 2024-01-15 ENCOUNTER — Telehealth: Payer: Self-pay | Admitting: Physical Therapy

## 2024-01-15 DIAGNOSIS — Z794 Long term (current) use of insulin: Secondary | ICD-10-CM

## 2024-01-15 MED ORDER — BD PEN NEEDLE NANO 2ND GEN 32G X 4 MM MISC
1.0000 [IU] | Freq: Every day | 0 refills | Status: DC
  Filled 2024-01-15: qty 100, 90d supply, fill #0

## 2024-01-15 NOTE — Therapy (Signed)
 OUTPATIENT PHYSICAL THERAPY VESTIBULAR TREATMENT     Patient Name: Frederick Moody MRN: [id number] DOB:Sep 16, 1955, 68 y.o., male Today's Date: 01/16/2024  END OF SESSION:  PT End of Session - 01/16/24 1012     Visit Number 5    Number of Visits 7    Date for PT Re-Evaluation 01/20/24    Authorization Type Medicare/Medicaid of [place]    PT Start Time 0935    PT Stop Time 1013    PT Time Calculation (min) 38 min    Equipment Utilized During Treatment Gait belt    Activity Tolerance Patient tolerated treatment well    Behavior During Therapy WFL for tasks assessed/performed           Past Medical History:  Diagnosis Date   Diabetes mellitus without complication (HCC)    ETOH abuse    Past Surgical History:  Procedure Laterality Date   APPENDECTOMY     IR THORACENTESIS ASP PLEURAL SPACE W/IMG GUIDE  02/06/2019   IRRIGATION AND DEBRIDEMENT ABSCESS N/A 01/13/2019   Procedure: IRRIGATION AND DEBRIDEMENT OF BACK ABSCESS;  Surgeon: Belinda Cough, MD;  Location: [facility] OR;  Service: General;  Laterality: N/A;   Patient Active Problem List   Diagnosis Date Noted   PICC (peripherally inserted central catheter) in place 02/18/2019   Abscess    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 Missed a few appointments. He reports that his daughter has been talking to his doctor. Does not have a new appointment. Pt reports that he stopped taking his meds except for this sugar pills. Reports that when he started taking that  medication again, he started getting dizzy again, so he has since stopped it. Unable to say what medication this is. He reports that d/t discontinuing meds, his dizziness has been better. Denies dizziness currently.    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 No  PRECAUTIONS: Fall  RED FLAGS: None   WEIGHT BEARING RESTRICTIONS: No  FALLS: Has patient fallen in last 6 months? Yes. Number of falls 2-3 a day for the past 2 months  LIVING ENVIRONMENT: Lives with: brother Lives in: House/apartment Stairs: ramp entry; 1 story home Has following equipment at home: Environmental consultant - 2 wheeled, Wheelchair (manual), and Tour manager  PLOF: Independent and brother assists with floor transfers from a fall; pt is retired   PATIENT GOALS: stop falls from occurring   OBJECTIVE:      TODAY'S TREATMENT: 01/15/24 Activity Comments  Vitals at start of session  96/70 mmHg, 74 bpm, 97% spO2  sitting head turns to targets 2x30 Mild-mod instability; at TM rail  sitting head nods to targets 2x30 Mild-mod instability; at TM rail   Romberg EO/EC 2x30 Required UE support with EC  Fwd/back stepping  Cueing for longer backwards steps. Weaned to no UE support with CGA  Gait + head turns Mod-severe unsteadiness and CGA  HOME EXERCISE PROGRAM Last updated: 01/16/24 Access Code: [email or web address] URL: [email or web address] Date: 01/16/2024 Prepared by: [facility] - Outpatient  Rehab - [facility]  Program Notes perform balance exercises in a corner for safety. do not perform standing exercises if you are feeling lightheaded.   Exercises - Supine Ankle Pumps  - 1 x daily - 7 x weekly - 3 sets - 10 reps - Supine Quadricep Sets  - 1 x daily - 7 x weekly - 3 sets - 10 reps - Supine Hip Adduction Isometric with Ball  - 1 x daily - 7 x  weekly - 3 sets - 10 reps - Active Straight Leg Raise with Quad Set  - 1 x daily - 7 x weekly - 3 sets - 10 reps - Side Stepping with Counter Support  - 1 x daily - 7 x weekly - 1 sets - 3 reps - 1-2 min hold - Backward Walking with Counter Support  - 1 x daily - 7 x weekly - 1 sets - 3-5 reps - 1-2 min  hold - Alternating Step Taps with Counter Support  - 1 x daily - 7 x weekly - 3 sets - 10 reps - Corner Balance Feet Together With Eyes Closed  - 1 x daily - 5 x weekly - 2 sets - 30 sec hold - Standing with Head Rotation  - 1 x daily - 5 x weekly - 2 sets - 30 sec hold - Standing with Head Nod  - 1 x daily - 5 x weekly - 2 sets - 30 sec hold   PATIENT EDUCATION: Education details: encouraged PCP appointment to discuss meds rather than discontinuing meds at his own discretion, HEP update with edu for safety Person educated: Patient Education method: Explanation, Demonstration, Tactile cues, Verbal cues, and Handouts Education comprehension: verbalized understanding and returned demonstration       ----------------------------------------------------------  Note: Objective measures were completed at Evaluation unless otherwise noted.  DIAGNOSTIC FINDINGS: none recent  COGNITION: Overall cognitive status: Within functional limits for tasks assessed   SENSATION: Pt reports my feet stay numb   POSTURE:  rounded shoulders and forward head  GAIT: Gait pattern: WFL Assistive device utilized: None Level of assistance: Modified independence  COORDINATION: Alternating pronation/supination: WNL B Alternating toe tap: WNL B Finger to nose: WNL B  FUNCTIONAL TESTS:     M-CTSIB  Condition 1: Firm Surface, EO 30 Sec, Normal Sway  Condition 2: Firm Surface, EC 7 Sec, Moderate Sway  Condition 3: Foam Surface, EO 30 Sec, Mild Sway  Condition 4: Foam Surface, EC 10 Sec, Moderate Sway     PATIENT SURVEYS:  DHI 38/100   Vitals sitting during session: 95/75mmHg,  87bpm Vitals standing during session: 81/78mmHg, 82bpm (c/o mild dizziness)  VESTIBULAR ASSESSMENT   GENERAL OBSERVATION: pt reports owning bifocals but does not wear them often   OCULOMOTOR EXAM: Ocular Alignment: Cover/Uncover Test: WNL Cover/Cross Cover Test: WNL   Ocular ROM: No Limitations   Spontaneous Nystagmus: absent   Gaze-Induced Nystagmus: absent   Smooth Pursuits: intact, saccades, and few saccades vertically   Saccades: 2 saccades to R (normal for age)   Convergence/Divergence: 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 Yes  SHORT TERM GOALS: Target date: 12/30/2023  Patient to be independent with initial HEP. Baseline: HEP initiated Goal status: MET     LONG TERM GOALS: Target date: 01/20/2024  Patient to be independent with advanced HEP. Baseline: Not yet initiated  Goal status: INITIAL  Patient to report tolerance for grocery shopping without dizziness or fear of falling. Baseline: Unable Goal status: INITIAL  Patient will report 0/10 dizziness with bed mobility.  Baseline: Symptomatic with sit>stand today Goal status: INITIAL  Patient to demonstrate mild-moderate sway with M-CTSIB condition with eyes closed/foam surface in order to improve safety in environments with uneven surfaces and dim lighting. Baseline: 10 sec and moderate sway Goal status: INITIAL  Patient to score at least 23/30 on FGA in order to decrease risk of falls. Baseline: NT Goal status:  INITIAL  Patient to report no falls in a 4 week period. Baseline: reports 2-3 falls/day Goal status: INITIAL  Patient to score at least 18 points less on DHI in order to meet MCID and improve functional outcomes.  Baseline: 38 Goal status: INITIAL    ASSESSMENT:  CLINICAL IMPRESSION: Patient arrived to session with report of improvement in dizziness since taking himself off of several meds. Encouraged pt to make an appointment to discuss med management with his PCP. As patient was asymptomatic at start of session, proceeded with balance activities incorporating head movements, EC, and narrow BOS. Limited stability evident with EC balance but pt able to safely recover with UE support. More significant instability evident with gait with head turns and will require increased practice. No complaints at end of session.   OBJECTIVE IMPAIRMENTS: decreased activity tolerance, decreased balance, and dizziness.   ACTIVITY LIMITATIONS: carrying, lifting, bending, sitting, standing, squatting, sleeping, stairs, transfers, bed mobility, bathing, toileting, dressing, reach over head, hygiene/grooming, locomotion level, and caring for others  PARTICIPATION LIMITATIONS: meal prep, cleaning, laundry, driving, shopping, community activity, yard work, and church  PERSONAL FACTORS: Age, Fitness, Past/current experiences, Time since onset of injury/illness/exacerbation, and 3+ comorbidities: DM, ETOH abuse, diabetic neuropathy, orthostasis  are also affecting patient's functional outcome.   REHAB POTENTIAL: Good  CLINICAL DECISION MAKING: Evolving/moderate complexity  EVALUATION COMPLEXITY: Moderate   PLAN:  PT FREQUENCY: 1x/week  PT DURATION: 6 weeks  PLANNED INTERVENTIONS: 97164- PT Re-evaluation, 97750- Physical Performance Testing, 97110-Therapeutic exercises, 97530- Therapeutic activity, [id number]- Neuromuscular re-education, 97535- Self Care, [id number]- Manual therapy, [id number]- Gait training, [phone]-  Canalith repositioning, 79439 (1-2 muscles), 20561 (3+ muscles)- Dry Needling, Patient/Family education, Balance training, Stair training, Taping, Vestibular training, DME instructions, Cryotherapy, and Moist heat  PLAN FOR NEXT SESSION: Check BP measures (orthostatic);  Review HEP for balance,  VOR, safe transfers/transitions. Can initiate habituation once BP is stable   Louana Terrilyn Christians, [place], DPT 01/16/24 10:15 AM  [facility] at [facility] [address] [place], [address] [place], [place] [address] Phone # [phone] Fax # [phone]

## 2024-01-15 NOTE — Telephone Encounter (Signed)
 Attempted to call patient to make aware of missed PT appointment but VM box was not set up.   Frederick Moody, PT, DPT 01/15/24 11:22 AM  [facility] at [facility] [address] [place], [address] [place], [place] [address] Phone # [phone] Fax # [phone]

## 2024-01-16 ENCOUNTER — Encounter: Payer: Self-pay | Admitting: Physical Therapy

## 2024-01-16 ENCOUNTER — Ambulatory Visit: Admitting: Physical Therapy

## 2024-01-16 ENCOUNTER — Other Ambulatory Visit: Payer: Self-pay

## 2024-01-16 DIAGNOSIS — R2681 Unsteadiness on feet: Secondary | ICD-10-CM

## 2024-01-16 DIAGNOSIS — R42 Dizziness and giddiness: Secondary | ICD-10-CM | POA: Diagnosis not present

## 2024-01-22 ENCOUNTER — Ambulatory Visit

## 2024-01-22 DIAGNOSIS — R2681 Unsteadiness on feet: Secondary | ICD-10-CM

## 2024-01-22 DIAGNOSIS — R42 Dizziness and giddiness: Secondary | ICD-10-CM

## 2024-01-22 NOTE — Therapy (Signed)
 OUTPATIENT PHYSICAL THERAPY VESTIBULAR TREATMENT, Recertification, and D/C Summary     Patient Name: Frederick Moody MRN: [id number] DOB:[birth year], 68 y.o., male Today's Date: 01/22/2024  PHYSICAL THERAPY DISCHARGE SUMMARY  Visits from Start of Care: 6  Current functional level related to goals / functional outcomes: See below for outcome measures   Remaining deficits: Balance deficits    Education / Equipment: HEP   Patient agrees to discharge. Patient goals were partially met. Patient is being discharged due to being pleased with the current functional level.   END OF SESSION:  PT End of Session - 01/22/24 1038     Visit Number 6    Number of Visits 7    Date for PT Re-Evaluation 01/20/24    Authorization Type Medicare/Medicaid of [place]    PT Start Time 1100    PT Stop Time 1145    PT Time Calculation (min) 45 min    Equipment Utilized During Treatment Gait belt    Activity Tolerance Patient tolerated treatment well    Behavior During Therapy WFL for tasks assessed/performed           Past Medical History:  Diagnosis Date   Diabetes mellitus without complication (HCC)    ETOH abuse    Past Surgical History:  Procedure Laterality Date   APPENDECTOMY     IR THORACENTESIS ASP PLEURAL SPACE W/IMG GUIDE  02/06/2019   IRRIGATION AND DEBRIDEMENT ABSCESS N/A 01/13/2019   Procedure: IRRIGATION AND DEBRIDEMENT OF BACK ABSCESS;  Surgeon: Belinda Cough, MD;  Location: [facility] OR;  Service: General;  Laterality: N/A;   Patient Active Problem List   Diagnosis Date Noted   PICC (peripherally inserted central catheter) in place 02/18/2019   Abscess    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 F/U with PCP on Tuesday.   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 No  PRECAUTIONS: Fall  RED FLAGS: None   WEIGHT BEARING RESTRICTIONS: No  FALLS: Has patient fallen in last 6 months? Yes. Number of falls 2-3 a day for the past 2 months  LIVING ENVIRONMENT: Lives with: brother Lives in: House/apartment Stairs: ramp entry; 1 story home Has following equipment at home: Environmental consultant - 2 wheeled, Wheelchair (manual), and Tour manager  PLOF: Independent and brother assists with floor transfers from a fall; pt is retired   PATIENT GOALS: stop falls from occurring   OBJECTIVE:    TODAY'S TREATMENT: 01/22/24 Activity Comments  Vitals: 111/75 mmHg, 69 bpm No symptoms  interview No falls in past 4 weeks Slight lightheaded w/ sit to stand  M-CTSIB See below  Dizziness Handicap Inventory 46/100 from previous   Self care mgmt Education in best practices regarding physical exercise as it pertains to his PMH        M-CTSIB  Condition 1: Firm Surface, EO 30 Sec, Normal and Mild Sway  Condition 2:  Firm Surface, EC 30 Sec, Moderate Sway  Condition 3: Foam Surface, EO 30 Sec, Normal and Mild Sway  Condition 4: Foam Surface, EC 30 Sec, Moderate Sway     TODAY'S TREATMENT: 01/15/24 Activity Comments  Vitals at start of session  96/70 mmHg, 74 bpm, 97% spO2  sitting head turns to targets 2x30 Mild-mod instability; at TM rail  sitting head nods to targets 2x30 Mild-mod instability; at TM rail   Romberg EO/EC 2x30 Required UE support with EC  Fwd/back stepping  Cueing for longer backwards steps. Weaned to no UE  support with CGA  Gait + head turns Mod-severe unsteadiness and CGA           HOME EXERCISE PROGRAM Last updated: 01/16/24 Access Code: [email or web address] URL: [email or web address] Date: 01/16/2024 Prepared by: [facility] - Outpatient  Rehab - [facility]  Program Notes perform balance exercises in a corner for safety. do not perform standing exercises if you are feeling lightheaded.   Exercises - Supine Ankle Pumps  - 1 x daily - 7 x weekly - 3 sets - 10 reps - Supine Quadricep Sets  - 1 x daily - 7 x weekly - 3 sets - 10 reps - Supine Hip Adduction Isometric with Ball  - 1 x daily - 7 x weekly - 3 sets - 10 reps - Active Straight Leg Raise with Quad Set  - 1 x daily - 7 x weekly - 3 sets - 10 reps - Side Stepping with Counter Support  - 1 x daily - 7 x weekly - 1 sets - 3 reps - 1-2 min hold - Backward Walking with Counter Support  - 1 x daily - 7 x weekly - 1 sets - 3-5 reps - 1-2 min  hold - Alternating Step Taps with Counter Support  - 1 x daily - 7 x weekly - 3 sets - 10 reps - Corner Balance Feet Together With Eyes Closed  - 1 x daily - 5 x weekly - 2 sets - 30 sec hold - Standing with Head Rotation  - 1 x daily - 5 x weekly - 2 sets - 30 sec hold - Standing with Head Nod  - 1 x daily - 5 x weekly - 2 sets - 30 sec hold   PATIENT EDUCATION: Education details: encouraged PCP appointment to discuss meds rather than discontinuing meds at his own discretion, HEP update with edu for safety Person educated: Patient Education method: Explanation, Demonstration, Tactile cues, Verbal cues, and Handouts Education comprehension: verbalized understanding and returned demonstration       ----------------------------------------------------------  Note: Objective measures were completed at Evaluation unless otherwise noted.  DIAGNOSTIC FINDINGS: none recent  COGNITION: Overall cognitive status: Within functional limits for tasks assessed   SENSATION: Pt reports my  feet stay numb   POSTURE:  rounded shoulders and forward head  GAIT: Gait pattern: WFL Assistive device utilized: None Level of assistance: Modified independence  COORDINATION: Alternating pronation/supination: WNL B Alternating toe tap: WNL B Finger to nose: WNL B  FUNCTIONAL TESTS:     M-CTSIB  Condition 1: Firm Surface, EO 30 Sec, Normal Sway  Condition 2: Firm Surface, EC 7 Sec, Moderate Sway  Condition 3: Foam Surface, EO 30 Sec, Mild Sway  Condition 4: Foam Surface, EC 10 Sec, Moderate Sway     PATIENT SURVEYS:  DHI 38/100   Vitals sitting during session: 95/66mmHg, 87bpm Vitals standing during session: 81/58mmHg, 82bpm (c/o mild dizziness)  VESTIBULAR ASSESSMENT   GENERAL  OBSERVATION: pt reports owning bifocals but does not wear them often   OCULOMOTOR EXAM: Ocular Alignment: Cover/Uncover Test: WNL Cover/Cross Cover Test: WNL   Ocular ROM: No Limitations   Spontaneous Nystagmus: absent   Gaze-Induced Nystagmus: absent   Smooth Pursuits: intact, saccades, and few saccades vertically   Saccades: 2 saccades to R (normal for age)   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 Yes  SHORT TERM GOALS: Target date: 12/30/2023  Patient to be independent with initial HEP. Baseline: HEP initiated Goal status: MET     LONG TERM GOALS: Target date: 01/20/2024  Patient to be independent with advanced HEP. Baseline: Not yet initiated  Goal status:  INITIAL  Patient to report tolerance for grocery shopping without dizziness or fear of falling. Baseline: Unable; no issues Goal status: MET  Patient will report 0/10 dizziness with bed mobility.  Baseline: Symptomatic with sit>stand today; maybe a little bit Goal status: MET  Patient to demonstrate mild-moderate sway with M-CTSIB condition with eyes closed/foam surface in order to improve safety in environments with uneven surfaces and dim lighting. Baseline: 10 sec and moderate sway; moderate sway condition 2 and 4 x 30 sec Goal status: NOT MET  Patient to score at least 23/30 on FGA in order to decrease risk of falls. Baseline: NT Goal status: Not tested  Patient to report no falls in a 4 week period. Baseline: reports 2-3 falls/day; 0 Goal status: MET  Patient to score at least 18 points less on DHI in order to meet MCID and improve functional outcomes.  Baseline: 38; 46 Goal status: NOT MET    ASSESSMENT:  CLINICAL IMPRESSION: Patient arrived to session with report of improvement in dizziness since taking himself off of several meds. Reports he is meeting with PCP to discuss med management. Vitals signs appear improved today with report of minimal lightheadedness with change of position and reports return to typical activities such as grocery shopping. Improved static balance under M-CTSIB with moderate sway under eyes closed conditions but maintaining stability x 30 sec. Dizziness Handicap Inventory score reports increased disability per report despite his report of improved symptoms.  Pt reports feeling markedly better since medication reconcilition and reports confidence in self-progression at this time and will D/C to HEP and medical f/u.  OBJECTIVE IMPAIRMENTS: decreased activity tolerance, decreased balance, and dizziness.  ACTIVITY LIMITATIONS: carrying, lifting, bending, sitting, standing, squatting, sleeping, stairs, transfers, bed mobility, bathing, toileting,  dressing, reach over head, hygiene/grooming, locomotion level, and caring for others  PARTICIPATION LIMITATIONS: meal prep, cleaning, laundry, driving, shopping, community activity, yard work, and church  PERSONAL FACTORS: Age, Fitness, Past/current experiences, Time since onset of injury/illness/exacerbation, and 3+ comorbidities: DM, ETOH abuse, diabetic neuropathy, orthostasis  are also affecting patient's functional outcome.   REHAB POTENTIAL: Good  CLINICAL DECISION MAKING: Evolving/moderate complexity  EVALUATION COMPLEXITY: Moderate   PLAN:  PT FREQUENCY: 1x/week  PT DURATION: 6 weeks  PLANNED INTERVENTIONS: 97164- PT Re-evaluation, 97750- Physical Performance Testing, 97110-Therapeutic exercises, 97530- Therapeutic activity, [id number]- Neuromuscular re-education, 97535- Self Care, 02859- Manual therapy, [id number]- Gait training, [phone]- Canalith repositioning, 79439 (1-2 muscles), 20561 (3+ muscles)- Dry Needling, Patient/Family education, Balance training, Stair training, Taping, Vestibular training, DME instructions, Cryotherapy, and Moist heat  PLAN FOR NEXT SESSION: D/C per pt request  1:00 PM, 01/22/24 M. Kelly Onesty Clair, PT, DPT Physical Therapist- [place] Office Number: [phone]

## 2024-01-24 ENCOUNTER — Telehealth: Payer: Self-pay | Admitting: Family Medicine

## 2024-01-24 NOTE — Telephone Encounter (Signed)
 Called patient to confirm appointment on 01/27/2024 at 10:30 am. unable to leave voicemail or reach patient.

## 2024-01-27 ENCOUNTER — Ambulatory Visit: Payer: Medicare Other | Attending: Family Medicine | Admitting: Family Medicine

## 2024-01-27 ENCOUNTER — Other Ambulatory Visit (HOSPITAL_COMMUNITY): Payer: Self-pay

## 2024-01-27 ENCOUNTER — Encounter: Payer: Self-pay | Admitting: Family Medicine

## 2024-01-27 ENCOUNTER — Other Ambulatory Visit: Payer: Self-pay

## 2024-01-27 VITALS — BP 134/80 | HR 60 | Ht 68.0 in | Wt 170.0 lb

## 2024-01-27 DIAGNOSIS — I951 Orthostatic hypotension: Secondary | ICD-10-CM | POA: Diagnosis not present

## 2024-01-27 DIAGNOSIS — I152 Hypertension secondary to endocrine disorders: Secondary | ICD-10-CM | POA: Diagnosis not present

## 2024-01-27 DIAGNOSIS — E1159 Type 2 diabetes mellitus with other circulatory complications: Secondary | ICD-10-CM | POA: Diagnosis not present

## 2024-01-27 DIAGNOSIS — E1140 Type 2 diabetes mellitus with diabetic neuropathy, unspecified: Secondary | ICD-10-CM | POA: Diagnosis present

## 2024-01-27 DIAGNOSIS — R634 Abnormal weight loss: Secondary | ICD-10-CM

## 2024-01-27 DIAGNOSIS — Z794 Long term (current) use of insulin: Secondary | ICD-10-CM | POA: Diagnosis not present

## 2024-01-27 DIAGNOSIS — E785 Hyperlipidemia, unspecified: Secondary | ICD-10-CM | POA: Diagnosis not present

## 2024-01-27 DIAGNOSIS — E1149 Type 2 diabetes mellitus with other diabetic neurological complication: Secondary | ICD-10-CM

## 2024-01-27 DIAGNOSIS — Z79899 Other long term (current) drug therapy: Secondary | ICD-10-CM | POA: Insufficient documentation

## 2024-01-27 DIAGNOSIS — K529 Noninfective gastroenteritis and colitis, unspecified: Secondary | ICD-10-CM | POA: Diagnosis not present

## 2024-01-27 DIAGNOSIS — R42 Dizziness and giddiness: Secondary | ICD-10-CM | POA: Diagnosis not present

## 2024-01-27 DIAGNOSIS — Z7984 Long term (current) use of oral hypoglycemic drugs: Secondary | ICD-10-CM | POA: Insufficient documentation

## 2024-01-27 DIAGNOSIS — E1169 Type 2 diabetes mellitus with other specified complication: Secondary | ICD-10-CM

## 2024-01-27 LAB — POCT GLYCOSYLATED HEMOGLOBIN (HGB A1C): HbA1c, POC (controlled diabetic range): 5.5 % (ref 0.0–7.0)

## 2024-01-27 MED ORDER — HYDROCHLOROTHIAZIDE 12.5 MG PO TABS
12.5000 mg | ORAL_TABLET | Freq: Every day | ORAL | 1 refills | Status: AC
  Filled 2024-01-27 – 2024-01-30 (×3): qty 90, 90d supply, fill #0

## 2024-01-27 MED ORDER — CARVEDILOL 6.25 MG PO TABS
6.2500 mg | ORAL_TABLET | Freq: Two times a day (BID) | ORAL | 1 refills | Status: AC
Start: 2024-01-27 — End: ?
  Filled 2024-01-27 (×2): qty 180, 90d supply, fill #0
  Filled 2024-04-30: qty 180, 90d supply, fill #1

## 2024-01-27 MED ORDER — METFORMIN HCL 500 MG PO TABS
1000.0000 mg | ORAL_TABLET | Freq: Two times a day (BID) | ORAL | 1 refills | Status: AC
  Filled 2024-01-27 (×2): qty 360, 90d supply, fill #0
  Filled 2024-04-30: qty 360, 90d supply, fill #1

## 2024-01-27 MED ORDER — TRESIBA FLEXTOUCH 100 UNIT/ML ~~LOC~~ SOPN
7.0000 [IU] | PEN_INJECTOR | Freq: Every day | SUBCUTANEOUS | 3 refills | Status: AC
  Filled 2024-01-27: qty 9, 128d supply, fill #0
  Filled 2024-04-30: qty 6, 85d supply, fill #0
  Filled 2024-05-05: qty 9, 128d supply, fill #0

## 2024-01-27 MED ORDER — GABAPENTIN 300 MG PO CAPS
600.0000 mg | ORAL_CAPSULE | Freq: Every day | ORAL | 1 refills | Status: AC
  Filled 2024-01-27 – 2024-02-06 (×7): qty 180, 90d supply, fill #0
  Filled 2024-04-30: qty 180, 90d supply, fill #1

## 2024-01-27 MED ORDER — ATORVASTATIN CALCIUM 40 MG PO TABS
40.0000 mg | ORAL_TABLET | Freq: Every day | ORAL | 1 refills | Status: AC
  Filled 2024-01-27 (×2): qty 90, 90d supply, fill #0
  Filled 2024-04-30: qty 90, 90d supply, fill #1

## 2024-01-27 NOTE — Progress Notes (Signed)
 Subjective:  Patient ID: Frederick Moody, male    DOB: October 08, 1955  Age: 68 y.o. MRN: [id number]  CC: Medical Management of Chronic Issues (Stopped Amlodipine  due to dizziness)     Discussed the use of AI scribe software for clinical note transcription with the patient, who gave verbal consent to proceed.  History of Present Illness Frederick Moody is a 68 year old male with a history of  type 2 diabetes mellitus with diabetic neuropathy, hypertension, vertigo, orthostatic hypotension who presents with dizziness and medication management.  He experiences dizziness, particularly during physical therapy, which he attributes to his medication regimen. He independently stopped amlodipine , resulting in reduced severe dizziness, though mild dizziness persists, especially when standing or closing his eyes. His blood pressure is managed with carvedilol  6.25 mg twice daily and hydrochlorothiazide  12.5 mg.  At his physical therapy sessions per his daughter his systolic blood pressures have been less than 120. I had also referred him for vestibular rehab for treatment of vertigo.  He has diabetes, well-controlled with an A1c of 5.5, and takes Tresiba  at 12 units daily. He also takes atorvastatin  40 mg for cholesterol management.  He reports chronic diarrhea since last  hospitalization several years ago, affecting his ability to complete a Cologuard test. He experiences frequent diarrhea.  He has neuropathy with 'electric shock' sensations in his legs, particularly at night, and has resumed gabapentin  300 mg with slight improvement in the pain.  Gabapentin  had been discontinued previously when he had complained of dizziness.  He has experienced weight fluctuations, losing 16 pounds at his last visit over 12 months but today he has regained 6 of those pounds back. He notes a correlation between dizziness, fear of falling, and reduced appetite, which has improved slightly as dizziness has lessened.   Workup for malignancy as cause of weight loss was negative.    Past Medical History:  Diagnosis Date   Diabetes mellitus without complication (HCC)    ETOH abuse     Past Surgical History:  Procedure Laterality Date   APPENDECTOMY     IR THORACENTESIS ASP PLEURAL SPACE W/IMG GUIDE  02/06/2019   IRRIGATION AND DEBRIDEMENT ABSCESS N/A 01/13/2019   Procedure: IRRIGATION AND DEBRIDEMENT OF BACK ABSCESS;  Surgeon: Belinda Cough, MD;  Location: [facility] OR;  Service: General;  Laterality: N/A;    No family history on file.  Social History   Socioeconomic History   Marital status: Single    Spouse name: Not on file   Number of children: Not on file   Years of education: Not on file   Highest education level: Not on file  Occupational History   Not on file  Tobacco Use   Smoking status: Never   Smokeless tobacco: Never  Vaping Use   Vaping status: Never Used  Substance and Sexual Activity   Alcohol  use: Not Currently   Drug use: Yes    Types: Marijuana   Sexual activity: Not on file  Other Topics Concern   Not on file  Social History Narrative   Not on file   Social Drivers of Health   Financial Resource Strain: Low Risk  (11/21/2022)   Overall Financial Resource Strain (CARDIA)    Difficulty of Paying Living Expenses: Not hard at all  Food Insecurity: No Food Insecurity (11/21/2022)   Hunger Vital Sign    Worried About Running Out of Food in the Last Year: Never true    Ran Out of Food in the Last Year: Never true  Transportation Needs: No Transportation Needs (11/21/2022)   PRAPARE - Administrator, Civil Service (Medical): No    Lack of Transportation (Non-Medical): No  Physical Activity: Inactive (11/21/2022)   Exercise Vital Sign    Days of Exercise per Week: 0 days    Minutes of Exercise per Session: 0 min  Stress: No Stress Concern Present (11/21/2022)   [employer] of Occupational Health - Occupational Stress Questionnaire    Feeling of Stress :  Not at all  Social Connections: Moderately Integrated (11/21/2022)   Social Connection and Isolation Panel    Frequency of Communication with Friends and Family: More than three times a week    Frequency of Social Gatherings with Friends and Family: Three times a week    Attends Religious Services: More than 4 times per year    Active Member of Clubs or Organizations: Yes    Attends Banker Meetings: 1 to 4 times per year    Marital Status: Divorced    Allergies  Allergen Reactions   Penicillins Swelling and Rash    Neck became swollen Did it involve swelling of the face/tongue/throat, SOB, or low BP? Yes Did it involve sudden or severe rash/hives, skin peeling, or any reaction on the inside of your mouth or nose? Unk Did you need to seek medical attention at a hospital or doctor's office? Unk When did it last happen? Happened in the 1980's If all above answers are NO, may proceed with cephalosporin use.     Outpatient Medications Prior to Visit  Medication Sig Dispense Refill   Accu-Chek Softclix Lancets lancets Use as instructed three times daily before meals. E11.69 100 each 6   blood glucose meter kit and supplies Please check your blood sugars twice daily. Once in the morning and once before bed. 1 each 0   Blood Glucose Monitoring Suppl (ACCU-CHEK GUIDE ME) w/Device KIT USE THREE TIMES DAILY BEFORE MEALS DX E11.49 1 kit 0   glucose blood (ACCU-CHEK GUIDE TEST) test strip Use to check blood sugar 3 times daily. E11.69 100 each 6   Insulin  Pen Needle (BD PEN NEEDLE NANO 2ND GEN) 32G X 4 MM MISC Use as directed to administer insulin . 100 each 0   meclizine  (ANTIVERT ) 25 MG tablet Take 1 tablet (25 mg total) by mouth 3 (three) times daily as needed for dizziness. 90 tablet 0   atorvastatin  (LIPITOR) 40 MG tablet Take 1 tablet (40 mg total) by mouth daily. 90 tablet 1   carvedilol  (COREG ) 6.25 MG tablet Take 1 tablet (6.25 mg total) by mouth 2 (two) times daily with a  meal. 180 tablet 1   gabapentin  (NEURONTIN ) 300 MG capsule Take 1 capsule (300 mg total) by mouth at bedtime. 90 capsule 1   hydrochlorothiazide  (HYDRODIURIL ) 12.5 MG tablet Take 1 tablet (12.5 mg total) by mouth daily. 90 tablet 1   insulin  degludec (TRESIBA  FLEXTOUCH) 100 UNIT/ML FlexTouch Pen Inject 12 Units into the skin daily. 9 mL 3   metFORMIN  (GLUCOPHAGE ) 500 MG tablet Take 2 tablets (1,000 mg total) by mouth 2 (two) times daily with a meal. 360 tablet 1   amLODipine  (NORVASC ) 10 MG tablet Take 1 tablet (10 mg total) by mouth daily. (Patient not taking: Reported on 01/27/2024) 90 tablet 1   No facility-administered medications prior to visit.     ROS Review of Systems  Constitutional:  Negative for activity change and appetite change.  HENT:  Negative for sinus pressure and sore throat.  Respiratory:  Negative for chest tightness, shortness of breath and wheezing.   Cardiovascular:  Negative for chest pain and palpitations.  Gastrointestinal:  Positive for diarrhea. Negative for abdominal distention, abdominal pain and constipation.  Genitourinary: Negative.   Musculoskeletal: Negative.   Neurological:  Positive for dizziness.  Psychiatric/Behavioral:  Negative for behavioral problems and dysphoric mood.     Objective:  BP 134/80   Pulse 60   Ht 5' 8 (1.727 m)   Wt 170 lb (77.1 kg)   SpO2 100%   BMI 25.85 kg/m      01/27/2024   11:06 AM 01/27/2024   10:36 AM 11/25/2023   11:11 AM  BP/Weight  Systolic BP 134 164 130  Diastolic BP 80 88 84  Wt. (Lbs)  170 164.2  BMI  25.85 kg/m2 24.97 kg/m2    Wt Readings from Last 3 Encounters:  01/27/24 170 lb (77.1 kg)  11/25/23 164 lb 3.2 oz (74.5 kg)  07/31/23 174 lb 3.2 oz (79 kg)   Dose increase  Physical Exam Constitutional:      Appearance: He is well-developed.  Cardiovascular:     Rate and Rhythm: Normal rate.     Heart sounds: Normal heart sounds. No murmur heard. Pulmonary:     Effort: Pulmonary effort is  normal.     Breath sounds: Normal breath sounds. No wheezing or rales.  Chest:     Chest wall: No tenderness.  Abdominal:     General: Bowel sounds are normal. There is no distension.     Palpations: Abdomen is soft. There is no mass.     Tenderness: There is no abdominal tenderness.  Musculoskeletal:        General: Normal range of motion.     Right lower leg: No edema.     Left lower leg: No edema.  Neurological:     Mental Status: He is alert and oriented to person, place, and time.  Psychiatric:        Mood and Affect: Mood normal.        Latest Ref Rng & Units 11/25/2023   12:10 PM 09/02/2023   11:18 AM 07/31/2023    4:21 PM  CMP  Glucose 70 - 99 mg/dL  85  884   BUN 8 - 27 mg/dL  15  27   Creatinine 9.23 - 1.27 mg/dL  9.06  8.81   Sodium 865 - 144 mmol/L  123  138   Potassium 3.5 - 5.2 mmol/L 4.1  5.5  5.7   Chloride 96 - 106 mmol/L  87  103   CO2 20 - 29 mmol/L  22  19   Calcium  8.6 - 10.2 mg/dL  9.4  9.4   Total Protein 6.0 - 8.5 g/dL   7.2   Total Bilirubin 0.0 - 1.2 mg/dL   0.4   Alkaline Phos 44 - 121 IU/L   65   AST 0 - 40 IU/L   13   ALT 0 - 44 IU/L   6     Lipid Panel     Component Value Date/Time   CHOL 122 08/09/2022 1101   TRIG 68 08/09/2022 1101   HDL 52 08/09/2022 1101   CHOLHDL 2.3 08/09/2022 1101   LDLCALC 56 08/09/2022 1101    CBC    Component Value Date/Time   WBC 8.8 12/13/2022 1201   WBC 5.5 02/05/2019 0346   RBC 3.83 (L) 12/13/2022 1201   RBC 2.77 (L) 02/05/2019 0346   HGB  12.9 (L) 12/13/2022 1201   HCT 39.4 12/13/2022 1201   PLT 237 12/13/2022 1201   MCV 103 (H) 12/13/2022 1201   MCH 33.7 (H) 12/13/2022 1201   MCH 31.8 02/05/2019 0346   MCHC 32.7 12/13/2022 1201   MCHC 31.8 02/05/2019 0346   RDW 13.1 12/13/2022 1201   LYMPHSABS 2.1 12/13/2022 1201   MONOABS 0.4 02/05/2019 0346   EOSABS 0.5 (H) 12/13/2022 1201   BASOSABS 0.1 12/13/2022 1201    Lab Results  Component Value Date   HGBA1C 5.5 01/27/2024    Lab Results   Component Value Date   HGBA1C 5.5 01/27/2024   HGBA1C 5.9 07/31/2023   HGBA1C 5.6 12/13/2022       Assessment & Plan Orthostatic hypotension with dizziness and vertigo Dizziness and near syncope episodes improved after stopping amlodipine . Considered multifactorial causes including medication side effects and orthostatic hypotension. - Recheck blood pressure after sitting. - Discontinue amlodipine  if blood pressure is controlled. - Continue vestibular rehabilitation exercises.  Type 2 diabetes mellitus with diabetic neuropathy, on insulin  A1c well-controlled at 5.5. Neuropathic pain persists, described as electric shock sensations, especially at night. Gabapentin  dose reduced due to dizziness. - Reduce Tresiba  insulin  dose to 7 units daily due to A1c of 5.5 - Increase gabapentin  to 2 tablets at bedtime due to uncontrolled neuropathy  Hypertension associated with diabetes Blood pressure reduced from 164/88 to 134/80 after rest. Current regimen includes carvedilol  and hydrochlorothiazide . Amlodipine  discontinuation has not affected control. - Continue carvedilol  6.25 mg twice daily. - Continue hydrochlorothiazide  12.5 mg daily. - Discontinue amlodipine  if blood pressure remains controlled.  Chronic diarrhea Persistent diarrhea since hospitalization, affecting Cologuard test completion. Will need to keep a food diary so we can see if this is related to certain foods Consider workup for inflammatory bowel disease at next visit if persisting  History of significant weight loss, now regained Regained 6 pounds since June. Initial weight loss due to decreased appetite and dizziness-related fear of falling. - Monitor weight and appetite.    Meds ordered this encounter  Medications   insulin  degludec (TRESIBA  FLEXTOUCH) 100 UNIT/ML FlexTouch Pen    Sig: Inject 7 Units into the skin daily.    Dispense:  9 mL    Refill:  3    Dose decrease   gabapentin  (NEURONTIN ) 300 MG capsule     Sig: Take 2 capsules (600 mg total) by mouth at bedtime.    Dispense:  180 capsule    Refill:  1    Dose increase   atorvastatin  (LIPITOR) 40 MG tablet    Sig: Take 1 tablet (40 mg total) by mouth daily.    Dispense:  90 tablet    Refill:  1   carvedilol  (COREG ) 6.25 MG tablet    Sig: Take 1 tablet (6.25 mg total) by mouth 2 (two) times daily with a meal.    Dispense:  180 tablet    Refill:  1   metFORMIN  (GLUCOPHAGE ) 500 MG tablet    Sig: Take 2 tablets (1,000 mg total) by mouth 2 (two) times daily with a meal.    Dispense:  360 tablet    Refill:  1   hydrochlorothiazide  (HYDRODIURIL ) 12.5 MG tablet    Sig: Take 1 tablet (12.5 mg total) by mouth daily.    Dispense:  90 tablet    Refill:  1    Follow-up: Return in about 6 months (around 07/29/2024) for Chronic medical conditions.       Petra Dumler,  MD, FAAFP. [facility] [place], [place] [phone]   01/27/2024, 11:08 AM

## 2024-01-27 NOTE — Patient Instructions (Signed)
 VISIT SUMMARY:  Today, we discussed your dizziness, diabetes management, blood pressure, chronic diarrhea, cholesterol, and weight changes. You have made some adjustments to your medications, and we reviewed your current regimen and symptoms.  YOUR PLAN:  -ORTHOSTATIC HYPOTENSION WITH DIZZINESS AND VERTIGO: Orthostatic hypotension is a condition where your blood pressure drops when you stand up, causing dizziness. You should recheck your blood pressure after sitting, discontinue amlodipine  if your blood pressure is controlled, and continue with your vestibular rehabilitation exercises.  -TYPE 2 DIABETES MELLITUS WITH DIABETIC NEUROPATHY: Type 2 diabetes is a condition where your body does not use insulin  properly, leading to high blood sugar levels. Your A1c is well-controlled at 5.5. You should reduce your Tresiba  insulin  dose to 7 units daily and increase gabapentin  to 2 tablets at bedtime if needed for neuropathic pain.  -HYPERTENSION ASSOCIATED WITH DIABETES: Hypertension is high blood pressure, which can be associated with diabetes. Your blood pressure has improved, and you should continue taking carvedilol  6.25 mg twice daily and hydrochlorothiazide  12.5 mg daily. Discontinue amlodipine .  -CHRONIC DIARRHEA: Chronic diarrhea is frequent, loose, or watery bowel movements. This has been persistent since your hospitalization and is affecting your ability to complete the Cologuard test.  -HYPERLIPIDEMIA: Hyperlipidemia is having high levels of fats in your blood. Your cholesterol levels were normal last February. You should continue taking atorvastatin  40 mg daily, and we will order fasting labs for cholesterol, kidney, and liver function.  -HISTORY OF SIGNIFICANT WEIGHT LOSS, NOW REGAINED: You have experienced weight loss due to decreased appetite and dizziness-related fear of falling, but you have regained 6 pounds since June. We will continue to monitor your weight and  appetite.  INSTRUCTIONS:  Please recheck your blood pressure after sitting and discontinue amlodipine  if your blood pressure is controlled. Reduce your Tresiba  insulin  dose to 7 units daily and increase gabapentin  to 2 tablets at bedtime if needed. Continue taking carvedilol  6.25 mg twice daily, hydrochlorothiazide  12.5 mg daily, and atorvastatin  40 mg daily. We will order fasting labs for cholesterol, kidney, and liver function. Continue with your vestibular rehabilitation exercises and monitor your weight and appetite.

## 2024-01-28 ENCOUNTER — Other Ambulatory Visit: Payer: Self-pay | Admitting: Pharmacist

## 2024-01-28 ENCOUNTER — Ambulatory Visit: Payer: Self-pay | Admitting: Family Medicine

## 2024-01-28 ENCOUNTER — Other Ambulatory Visit (HOSPITAL_COMMUNITY): Payer: Self-pay

## 2024-01-28 ENCOUNTER — Other Ambulatory Visit: Payer: Self-pay

## 2024-01-28 DIAGNOSIS — E875 Hyperkalemia: Secondary | ICD-10-CM

## 2024-01-28 LAB — CMP14+EGFR
ALT: 23 IU/L (ref 0–44)
AST: 15 IU/L (ref 0–40)
Albumin: 4.2 g/dL (ref 3.9–4.9)
Alkaline Phosphatase: 70 IU/L (ref 44–121)
BUN/Creatinine Ratio: 10 (ref 10–24)
BUN: 11 mg/dL (ref 8–27)
Bilirubin Total: 0.5 mg/dL (ref 0.0–1.2)
CO2: 23 mmol/L (ref 20–29)
Calcium: 9.2 mg/dL (ref 8.6–10.2)
Chloride: 93 mmol/L — ABNORMAL LOW (ref 96–106)
Creatinine, Ser: 1.08 mg/dL (ref 0.76–1.27)
Globulin, Total: 2.6 g/dL (ref 1.5–4.5)
Glucose: 84 mg/dL (ref 70–99)
Potassium: 5.7 mmol/L — ABNORMAL HIGH (ref 3.5–5.2)
Sodium: 130 mmol/L — ABNORMAL LOW (ref 134–144)
Total Protein: 6.8 g/dL (ref 6.0–8.5)
eGFR: 75 mL/min/1.73 (ref >59)

## 2024-01-28 LAB — LP+NON-HDL CHOLESTEROL
Cholesterol, Total: 112 mg/dL (ref 100–199)
HDL: 43 mg/dL (ref >39)
LDL Chol Calc (NIH): 54 mg/dL (ref 0–99)
Total Non-HDL-Chol (LDL+VLDL): 69 mg/dL (ref 0–129)
Triglycerides: 74 mg/dL (ref 0–149)
VLDL Cholesterol Cal: 15 mg/dL (ref 5–40)

## 2024-01-28 MED ORDER — SODIUM POLYSTYRENE SULFONATE 15 GM/60ML CO SUSP
15.0000 g | Freq: Once | 0 refills | Status: DC
  Filled 2024-01-28 (×2): qty 60, 1d supply, fill #0

## 2024-01-28 MED ORDER — SODIUM POLYSTYRENE SULFONATE 15 GM/60ML CO SUSP
15.0000 g | Freq: Once | 0 refills | Status: AC
  Filled 2024-01-28 (×2): qty 60, 1d supply, fill #0

## 2024-01-30 ENCOUNTER — Other Ambulatory Visit: Payer: Self-pay

## 2024-01-30 ENCOUNTER — Other Ambulatory Visit (HOSPITAL_COMMUNITY): Payer: Self-pay

## 2024-02-05 ENCOUNTER — Other Ambulatory Visit: Payer: Self-pay

## 2024-02-06 ENCOUNTER — Other Ambulatory Visit (HOSPITAL_COMMUNITY): Payer: Self-pay

## 2024-02-06 ENCOUNTER — Other Ambulatory Visit: Payer: Self-pay

## 2024-02-25 ENCOUNTER — Ambulatory Visit (INDEPENDENT_AMBULATORY_CARE_PROVIDER_SITE_OTHER): Admitting: Podiatry

## 2024-02-25 ENCOUNTER — Encounter: Payer: Self-pay | Admitting: Podiatry

## 2024-02-25 DIAGNOSIS — B351 Tinea unguium: Secondary | ICD-10-CM

## 2024-02-25 DIAGNOSIS — M79674 Pain in right toe(s): Secondary | ICD-10-CM

## 2024-02-25 DIAGNOSIS — M79675 Pain in left toe(s): Secondary | ICD-10-CM | POA: Diagnosis not present

## 2024-02-25 NOTE — Progress Notes (Signed)
 This patient presents to the office with chief complaint of long thick painful nails.  Patient says the nails are painful walking and wearing shoes.  This patient is unable to self treat.  This patient is unable to trim his nails since she is unable to reach his nails.  She presents to the office for preventative foot care services. Patient is diabetic.  General Appearance  Alert, conversant and in no acute stress.  Vascular  Dorsalis pedis and posterior tibial  pulses are palpable  bilaterally.  Capillary return is within normal limits  bilaterally.   Neurologic  Senn-Weinstein monofilament wire test diminished   bilaterally. Muscle power within normal limits bilaterally.  Nails Thick disfigured discolored nails with subungual debris  from hallux to fifth toes bilaterally. No evidence of bacterial infection or drainage bilaterally.  Orthopedic  No limitations of motion  feet .  No crepitus or effusions noted.  No bony pathology or digital deformities noted.  DJD 1st MPJ right foot.  Skin  normotropic skin with no porokeratosis noted bilaterally.  No signs of infections or ulcers noted.     Onychomycosis  Nails  B/L.  Pain in right toes  Pain in left toes  Debridement of nails both feet followed trimming the nails with dremel tool.    RTC 3 months.   Ruffin Cotton DPM

## 2024-04-30 ENCOUNTER — Encounter: Payer: Self-pay | Admitting: Pharmacist

## 2024-04-30 ENCOUNTER — Other Ambulatory Visit: Payer: Self-pay

## 2024-04-30 ENCOUNTER — Other Ambulatory Visit (HOSPITAL_COMMUNITY): Payer: Self-pay

## 2024-04-30 ENCOUNTER — Other Ambulatory Visit: Payer: Self-pay | Admitting: Family Medicine

## 2024-04-30 DIAGNOSIS — E1169 Type 2 diabetes mellitus with other specified complication: Secondary | ICD-10-CM

## 2024-05-01 ENCOUNTER — Other Ambulatory Visit (HOSPITAL_COMMUNITY): Payer: Self-pay

## 2024-05-01 ENCOUNTER — Other Ambulatory Visit: Payer: Self-pay

## 2024-05-01 MED ORDER — EMBECTA PEN NEEDLE NANO 32G X 4 MM MISC
1.0000 [IU] | Freq: Every day | 1 refills | Status: AC
  Filled 2024-05-01: qty 100, 90d supply, fill #0

## 2024-05-05 ENCOUNTER — Other Ambulatory Visit (HOSPITAL_COMMUNITY): Payer: Self-pay

## 2024-05-05 ENCOUNTER — Other Ambulatory Visit: Payer: Self-pay

## 2024-05-26 ENCOUNTER — Encounter: Payer: Self-pay | Admitting: Podiatry

## 2024-05-26 ENCOUNTER — Ambulatory Visit: Admitting: Podiatry

## 2024-05-26 DIAGNOSIS — M79675 Pain in left toe(s): Secondary | ICD-10-CM

## 2024-05-26 DIAGNOSIS — B351 Tinea unguium: Secondary | ICD-10-CM | POA: Diagnosis not present

## 2024-05-26 DIAGNOSIS — M79674 Pain in right toe(s): Secondary | ICD-10-CM

## 2024-05-26 NOTE — Progress Notes (Addendum)
 This patient presents to the office with chief complaint of long thick painful nails.  Patient says the nails are painful walking and wearing shoes.  This patient is unable to self treat.  This patient is unable to trim his nails since he is unable to reach his nails.  he presents to the office for preventative foot care services. Patient is diabetic.  General Appearance  Alert, conversant and in no acute stress.  Vascular  Dorsalis pedis and posterior tibial  pulses are palpable  bilaterally.  Capillary return is within normal limits  bilaterally.   Neurologic  Senn-Weinstein monofilament wire test diminished   bilaterally. Muscle power within normal limits bilaterally.  Nails Thick disfigured discolored nails with subungual debris  from hallux to fifth toes bilaterally. No evidence of bacterial infection or drainage bilaterally.  Orthopedic  No limitations of motion  feet .  No crepitus or effusions noted.  No bony pathology or digital deformities noted.  DJD 1st MPJ right foot.  Skin  normotropic skin with no porokeratosis noted bilaterally.  No signs of infections or ulcers noted.     Onychomycosis  Nails  B/L.  Pain in right toes  Pain in left toes  Debridement of nails both feet followed trimming the nails with dremel tool.    RTC 3 months.   Cordella Bold DPM

## 2024-06-06 ENCOUNTER — Other Ambulatory Visit: Payer: Self-pay | Admitting: Family Medicine

## 2024-06-06 DIAGNOSIS — E1169 Type 2 diabetes mellitus with other specified complication: Secondary | ICD-10-CM

## 2024-07-29 ENCOUNTER — Ambulatory Visit: Admitting: Family Medicine

## 2024-08-04 ENCOUNTER — Ambulatory Visit: Admitting: Family Medicine

## 2024-08-24 ENCOUNTER — Ambulatory Visit: Admitting: Podiatry
